# Patient Record
Sex: Female | Born: 1950
Health system: Southern US, Community
[De-identification: ages and names within clinical notes are randomized; demographics above are authoritative.]

## PROBLEM LIST (undated history)

## (undated) DIAGNOSIS — R0902 Hypoxemia: Secondary | ICD-10-CM

## (undated) DIAGNOSIS — M109 Gout, unspecified: Secondary | ICD-10-CM

## (undated) DIAGNOSIS — I5030 Unspecified diastolic (congestive) heart failure: Secondary | ICD-10-CM

## (undated) DIAGNOSIS — E669 Obesity, unspecified: Secondary | ICD-10-CM

## (undated) DIAGNOSIS — R079 Chest pain, unspecified: Secondary | ICD-10-CM

## (undated) DIAGNOSIS — E1121 Type 2 diabetes mellitus with diabetic nephropathy: Secondary | ICD-10-CM

## (undated) DIAGNOSIS — I10 Essential (primary) hypertension: Secondary | ICD-10-CM

## (undated) DIAGNOSIS — E7800 Pure hypercholesterolemia, unspecified: Secondary | ICD-10-CM

## (undated) DIAGNOSIS — G4733 Obstructive sleep apnea (adult) (pediatric): Secondary | ICD-10-CM

## (undated) HISTORY — DX: Hypoxemia: R09.02

## (undated) HISTORY — PX: ABDOMINAL HYSTERECTOMY: SHX81

---

## 1998-08-23 ENCOUNTER — Emergency Department (HOSPITAL_COMMUNITY): Admission: EM | Admit: 1998-08-23 | Discharge: 1998-08-23 | Payer: Self-pay | Admitting: Emergency Medicine

## 1998-09-03 ENCOUNTER — Encounter: Admission: RE | Admit: 1998-09-03 | Discharge: 1998-12-02 | Payer: Self-pay | Admitting: Orthopedic Surgery

## 1999-03-06 ENCOUNTER — Ambulatory Visit (HOSPITAL_COMMUNITY): Admission: RE | Admit: 1999-03-06 | Discharge: 1999-03-06 | Payer: Self-pay | Admitting: *Deleted

## 1999-03-06 ENCOUNTER — Encounter: Payer: Self-pay | Admitting: *Deleted

## 2000-04-14 ENCOUNTER — Encounter: Admission: RE | Admit: 2000-04-14 | Discharge: 2000-07-13 | Payer: Self-pay | Admitting: Internal Medicine

## 2000-06-05 ENCOUNTER — Encounter: Payer: Self-pay | Admitting: Internal Medicine

## 2000-06-05 ENCOUNTER — Ambulatory Visit (HOSPITAL_COMMUNITY): Admission: RE | Admit: 2000-06-05 | Discharge: 2000-06-05 | Payer: Self-pay | Admitting: Internal Medicine

## 2000-06-09 ENCOUNTER — Other Ambulatory Visit: Admission: RE | Admit: 2000-06-09 | Discharge: 2000-06-09 | Payer: Self-pay | Admitting: Internal Medicine

## 2001-05-21 ENCOUNTER — Encounter: Payer: Self-pay | Admitting: Podiatry

## 2001-05-21 ENCOUNTER — Ambulatory Visit (HOSPITAL_COMMUNITY): Admission: RE | Admit: 2001-05-21 | Discharge: 2001-05-21 | Payer: Self-pay | Admitting: Podiatry

## 2001-06-07 ENCOUNTER — Ambulatory Visit (HOSPITAL_COMMUNITY): Admission: RE | Admit: 2001-06-07 | Discharge: 2001-06-07 | Payer: Self-pay | Admitting: Internal Medicine

## 2001-06-07 ENCOUNTER — Encounter: Payer: Self-pay | Admitting: Internal Medicine

## 2002-05-18 ENCOUNTER — Ambulatory Visit (HOSPITAL_COMMUNITY): Admission: RE | Admit: 2002-05-18 | Discharge: 2002-05-18 | Payer: Self-pay | Admitting: Internal Medicine

## 2002-05-18 ENCOUNTER — Encounter: Payer: Self-pay | Admitting: Internal Medicine

## 2003-02-08 ENCOUNTER — Ambulatory Visit (HOSPITAL_COMMUNITY): Admission: RE | Admit: 2003-02-08 | Discharge: 2003-02-08 | Payer: Self-pay | Admitting: Internal Medicine

## 2004-01-19 ENCOUNTER — Ambulatory Visit (HOSPITAL_COMMUNITY): Admission: RE | Admit: 2004-01-19 | Discharge: 2004-01-19 | Payer: Self-pay | Admitting: Gastroenterology

## 2004-01-19 ENCOUNTER — Encounter (INDEPENDENT_AMBULATORY_CARE_PROVIDER_SITE_OTHER): Payer: Self-pay | Admitting: Specialist

## 2004-04-18 ENCOUNTER — Ambulatory Visit (HOSPITAL_COMMUNITY): Admission: RE | Admit: 2004-04-18 | Discharge: 2004-04-18 | Payer: Self-pay | Admitting: Internal Medicine

## 2004-05-01 ENCOUNTER — Encounter: Admission: RE | Admit: 2004-05-01 | Discharge: 2004-05-01 | Payer: Self-pay | Admitting: Internal Medicine

## 2004-12-13 ENCOUNTER — Emergency Department (HOSPITAL_COMMUNITY): Admission: EM | Admit: 2004-12-13 | Discharge: 2004-12-13 | Payer: Self-pay | Admitting: Family Medicine

## 2005-04-30 ENCOUNTER — Ambulatory Visit (HOSPITAL_COMMUNITY): Admission: RE | Admit: 2005-04-30 | Discharge: 2005-04-30 | Payer: Self-pay | Admitting: Internal Medicine

## 2006-05-28 ENCOUNTER — Encounter: Admission: RE | Admit: 2006-05-28 | Discharge: 2006-08-26 | Payer: Self-pay | Admitting: Occupational Medicine

## 2007-05-14 ENCOUNTER — Ambulatory Visit (HOSPITAL_COMMUNITY): Admission: RE | Admit: 2007-05-14 | Discharge: 2007-05-14 | Payer: Self-pay | Admitting: Internal Medicine

## 2007-07-10 ENCOUNTER — Emergency Department (HOSPITAL_COMMUNITY): Admission: EM | Admit: 2007-07-10 | Discharge: 2007-07-10 | Payer: Self-pay | Admitting: Emergency Medicine

## 2007-10-11 ENCOUNTER — Ambulatory Visit: Admission: RE | Admit: 2007-10-11 | Discharge: 2007-10-11 | Payer: Self-pay | Admitting: Otolaryngology

## 2007-10-11 IMAGING — CT CT PARANASAL SINUSES LIMITED
1 of 2 series · 16 of 30 positions shown, 20 images · non-contrast
Comparison: None

CLINICAL DATA: Sinusitis.  Nasal polyps.

CT PARANASAL SINUS LIMITED WITHOUT CONTRAST

[Series 3: ltd sinuses 3.0 h40s st · axial · 0.29mm/px · z∈[-106,+2]mm · 16 of 42 slices shown, 20 images]
[im 3/42  brain]
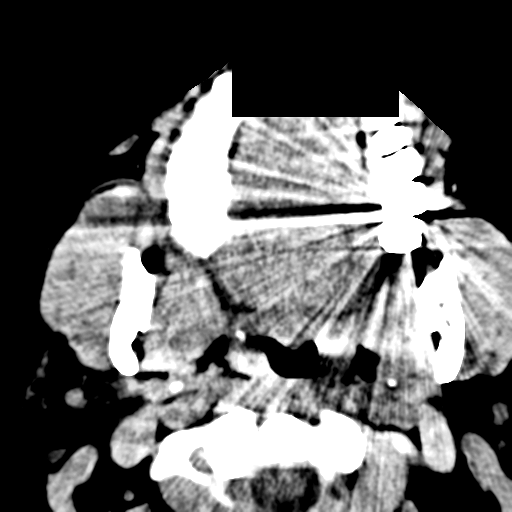
[im 3/42  bone]
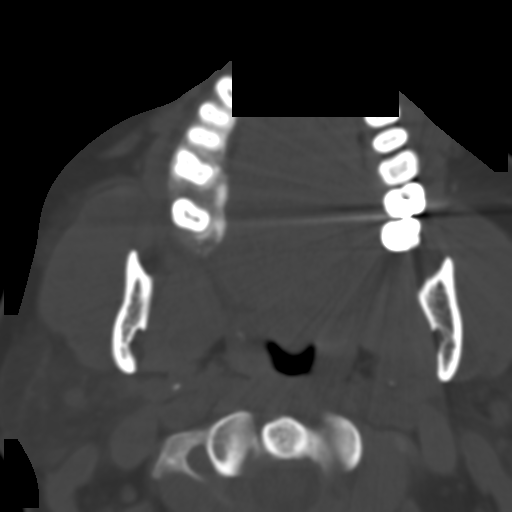
[im 5/42  bone]
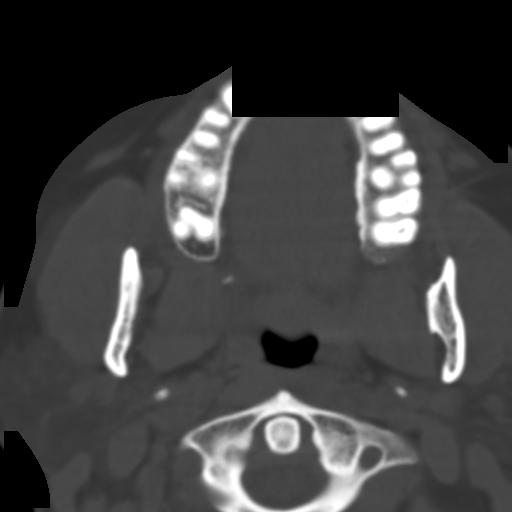
[im 7/42  bone]
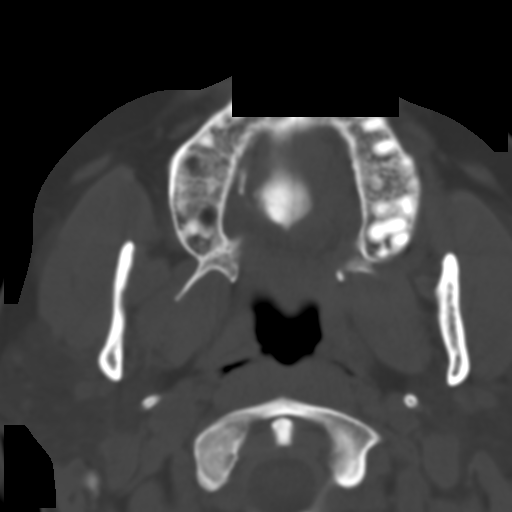
[im 11/42  bone]
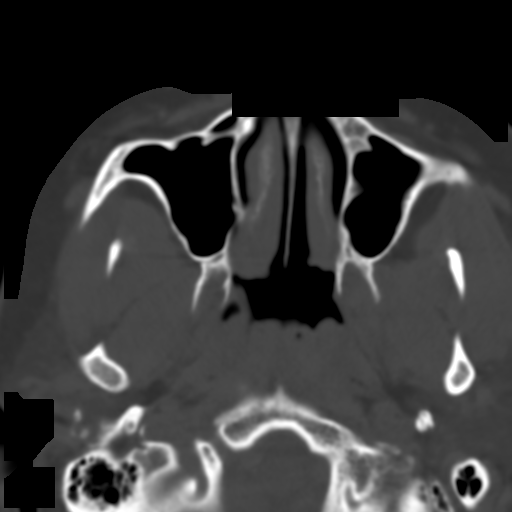
[im 13/42  brain]
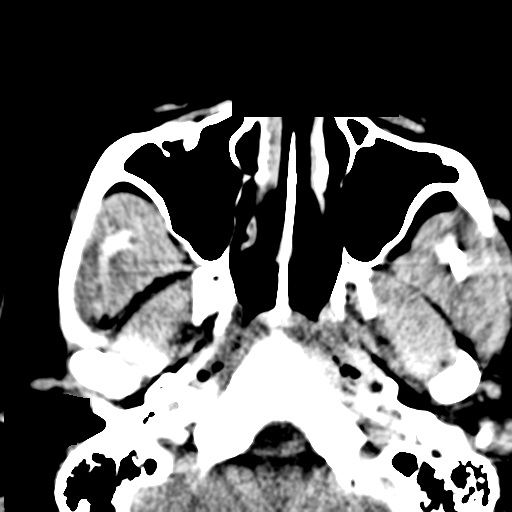
[im 13/42  bone]
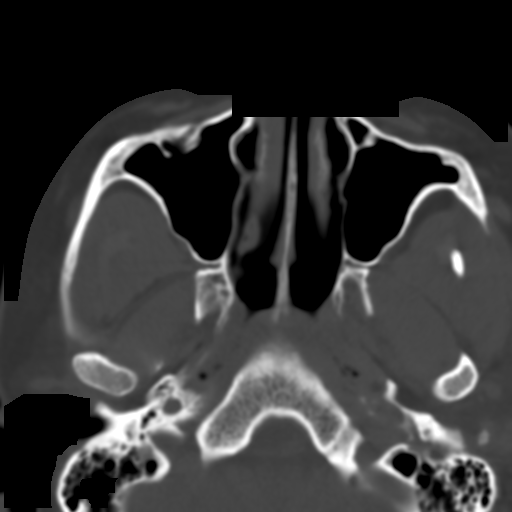
[im 15/42  bone]
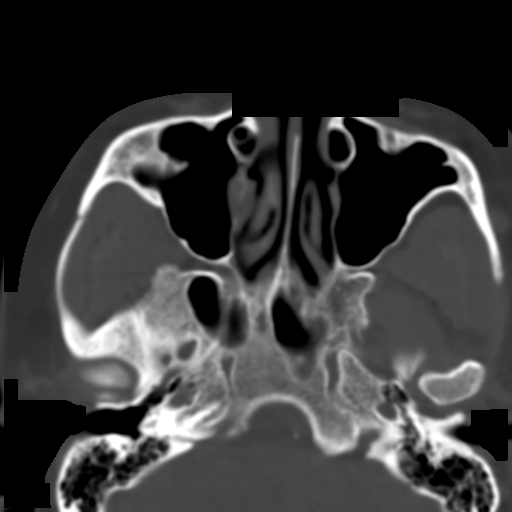
[im 17/42  bone]
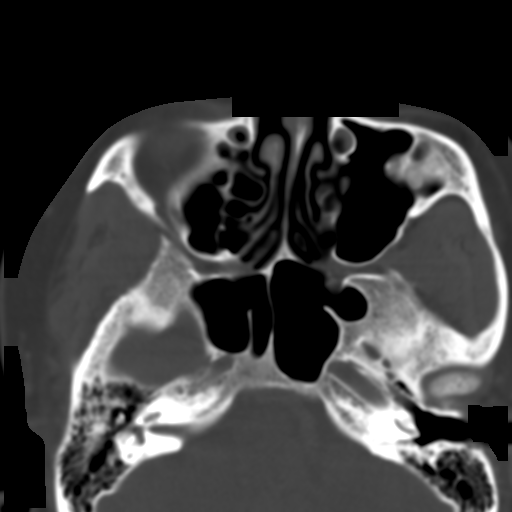
[im 19/42  bone]
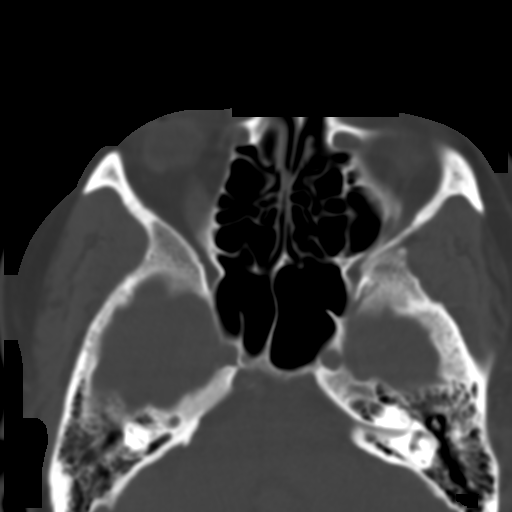
[im 23/42  brain]
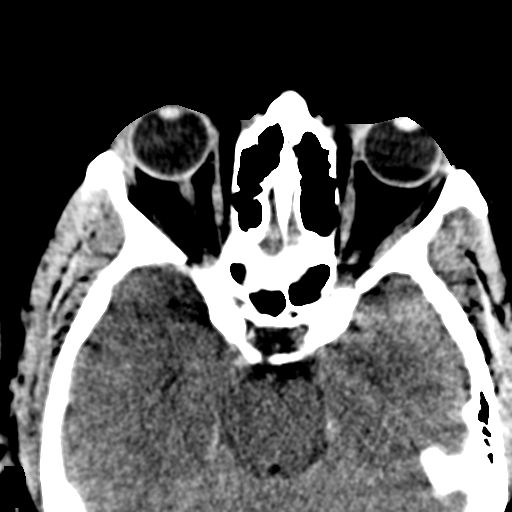
[im 23/42  bone]
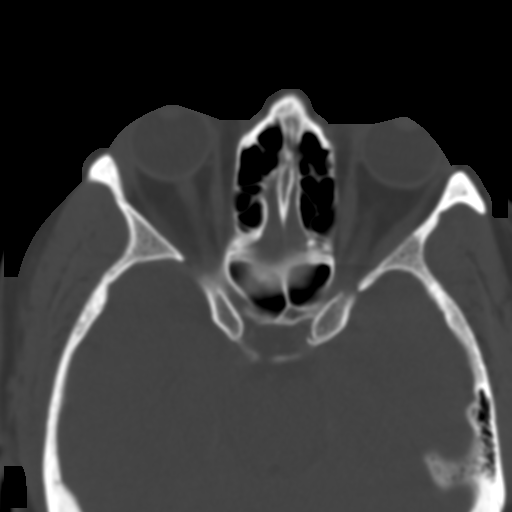
[im 25/42  bone]
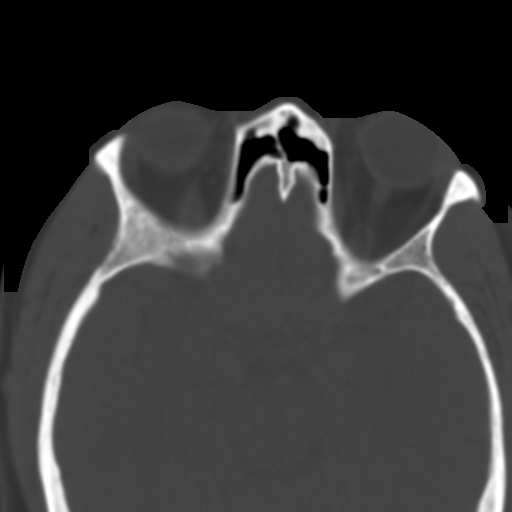
[im 27/42  bone]
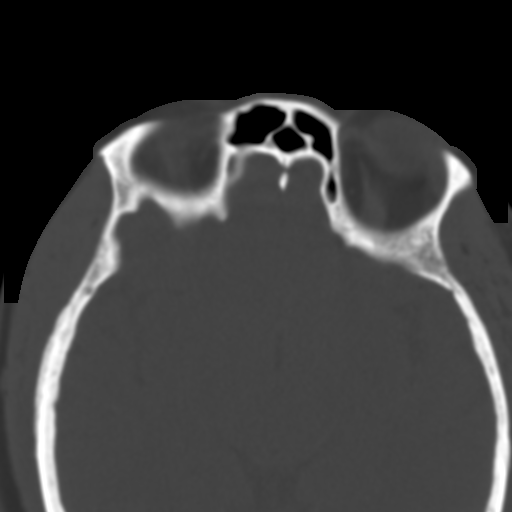
[im 29/42  bone]
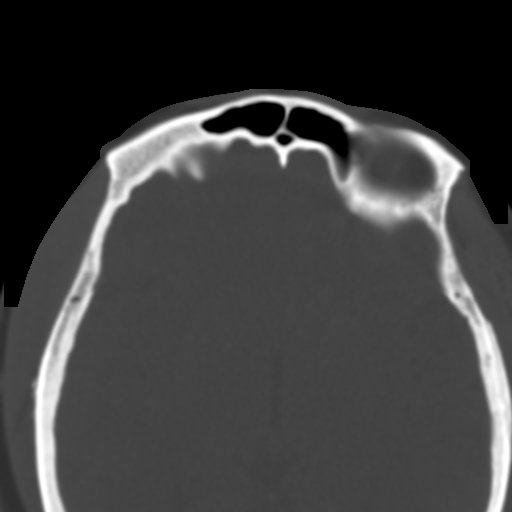
[im 31/42  brain]
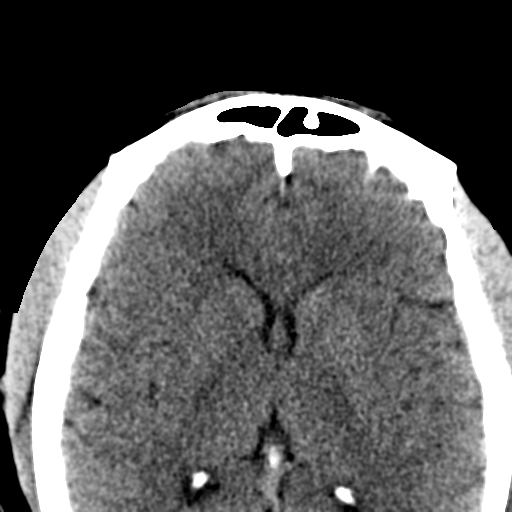
[im 31/42  bone]
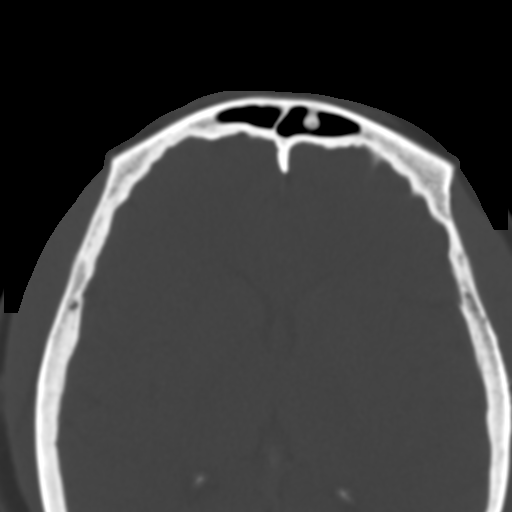
[im 35/42  bone]
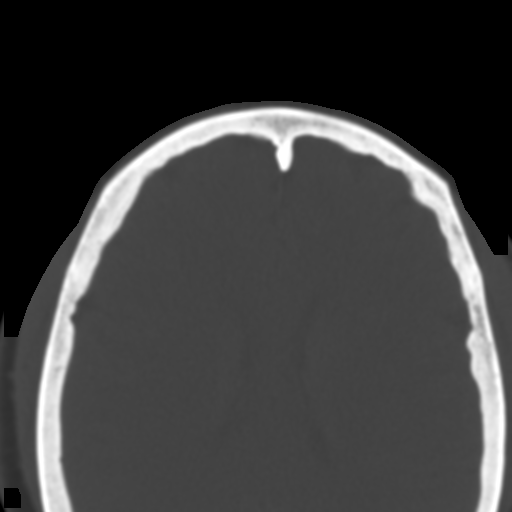
[im 37/42  bone]
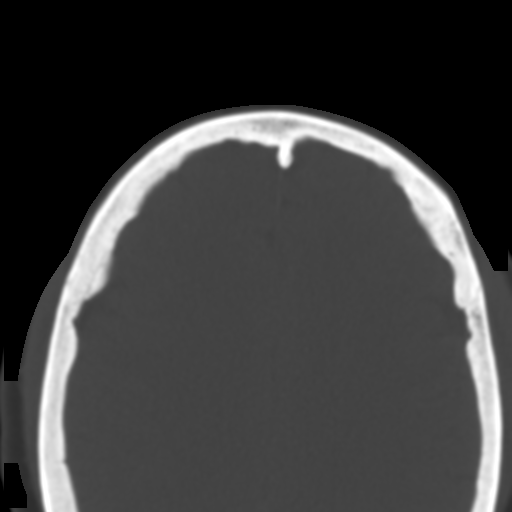
[im 39/42  bone]
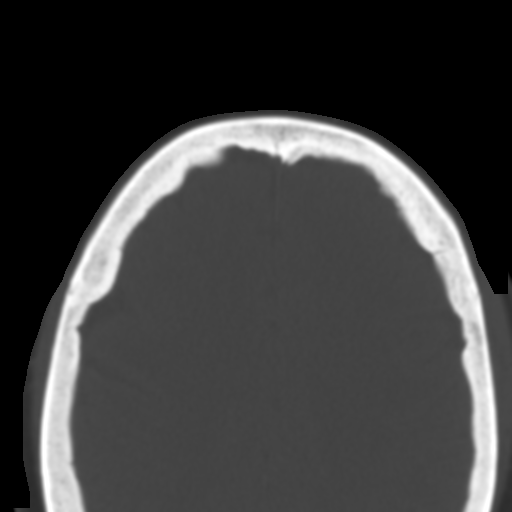

[16 of 30 positions shown; findings below may reference images not displayed]

FINDINGS: The paranasal sinuses are clear.  There is no mucosal
thickening.  No polyp or mass is identified in the sinuses.  There
is no bony change.

Incidental note is made of a well-defined calcified density the
left frontal sinus measuring 5 mm   compatible with a frontal
osteoma.
IMPRESSION: No significant sinus mucosal disease is present.

Left frontal sinus osteoma.

## 2007-11-12 ENCOUNTER — Ambulatory Visit (HOSPITAL_BASED_OUTPATIENT_CLINIC_OR_DEPARTMENT_OTHER): Admission: RE | Admit: 2007-11-12 | Discharge: 2007-11-12 | Payer: Self-pay | Admitting: Otolaryngology

## 2008-01-25 ENCOUNTER — Encounter (INDEPENDENT_AMBULATORY_CARE_PROVIDER_SITE_OTHER): Payer: Self-pay | Admitting: Otolaryngology

## 2008-01-25 ENCOUNTER — Ambulatory Visit (HOSPITAL_COMMUNITY): Admission: RE | Admit: 2008-01-25 | Discharge: 2008-01-26 | Payer: Self-pay | Admitting: Otolaryngology

## 2008-01-25 HISTORY — PX: UVULOPALATOPHARYNGOPLASTY (UPPP)/TONSILLECTOMY/SEPTOPLASTY: SHX6164

## 2008-06-09 ENCOUNTER — Ambulatory Visit (HOSPITAL_COMMUNITY): Admission: RE | Admit: 2008-06-09 | Discharge: 2008-06-09 | Payer: Self-pay | Admitting: Internal Medicine

## 2010-05-06 LAB — GLUCOSE, CAPILLARY
Glucose-Capillary: 123 mg/dL — ABNORMAL HIGH (ref 70–99)
Glucose-Capillary: 149 mg/dL — ABNORMAL HIGH (ref 70–99)
Glucose-Capillary: 181 mg/dL — ABNORMAL HIGH (ref 70–99)
Glucose-Capillary: 203 mg/dL — ABNORMAL HIGH (ref 70–99)
Glucose-Capillary: 231 mg/dL — ABNORMAL HIGH (ref 70–99)

## 2010-06-04 NOTE — Op Note (Signed)
NAMEGEORGEANNA, NALLE                 ACCOUNT NO.:  [id number]   MEDICAL RECORD NO.:  [id number]          PATIENT TYPE:  OIB   LOCATION:  3314                         FACILITY:  [facility]   PHYSICIAN:  Minna Merritts, M.D.   DATE OF BIRTH:  01-May-1950   DATE OF PROCEDURE:  DATE OF DISCHARGE:                               OPERATIVE REPORT   PREOPERATIVE DIAGNOSES:  Sleep apnea with an apnea-hypopnea index 45.2,  lowest O2 nadir 69%, deepest sleep was 7% at a time, and BMI 43.6.   POSTOPERATIVE DIAGNOSES:  Sleep apnea with an apnea-hypopnea index 45.2,  lowest O2 nadir 69%, deepest sleep was 7% at a time, and BMI 43.6.   SURGERY:  Septal reconstruction, turbinate reduction, reduction of  concha bullosa with a tonsillectomy and uvulopalatoplasty.   OPERATOR:  Minna Merritts, MD   ANESTHESIA:  General endotracheal.   PROCEDURE:  The patient placed in supine position under general  endotracheal anesthesia.  The patient was positioned and prepped and  draped in the usual manner using Hibiclens and the usual head drape.  The patient first was approached with preseptal surgery and the topical  cocaine and 1% Xylocaine was used, topical cocaine 200 mg and 1%  Xylocaine is 3 mL.  Hemitransfixion incision was made on the left side  of the septum carried back along the quadrilateral cartilage and the  posterior strip of quadrilateral cartilage was removed as this was  deviated grossly to the left blocking the nose on the left side.  The  high ethmoid septal deviation was also removed using the open and close  Jansen-Middletons in an effort to bring the septum back to the midline.  We worked back to the vomerine septum as well and removed a portion of  this.  As this was deviated grossly off to the left.  The right side was  fairly blocked as well with concha bullosa, which we compressed but did  not remove any mucous membrane.  The inferior turbinates were  aggressively all fractured and  compressed and gaining a considerable  amount of space in the nasal vestibule.  Once this was completed, the  closure was with 5-0 plain catgut and through-and-through septal suture  x2 was a Blanca stitch using 4-0 plain.  Once this was completed, then  we placed Telfa with Neosporin ointment and the attention was then  carried to the oral cavity.  We were using the [facility] mouth gag, the  tongue was depressed and the gag was placed and then the tonsils could  be well visualized, which were taking up considerable space in this  relatively small throat.  Tonsils were removed gaining a considerable  amount of space and the tongue was obviously large and her neck was also  quite full pushing the tongue high in her oral cavity because of her  weight issue.  Then, the uvula and palate, the uvula was about 3 times  larger than its usual size and this was trimmed back to a more normal  status.  The palate was also trimmed to elevate the contour of the  palate approximately 7 mm.  The AP mucous membrane was closed using a 5-  0 plain catgut.  Once this was completed and the tonsillar beds were  completely evaluated and found to be completely dry, the stomach was  suctioned and the patient was taken into the recovery room in excellent  condition once she was awakened and extubated.  Also, we removed the  Telfa from her nose and placed an 8 and 7-1/2 anesthesia trumpet on each  side to guarantee her airway and in the immediate postop  status.  Once this was completed, the patient was awakened and tolerated  the procedure well.  Blood loss was estimated at 40 mL and postop course  should be kept overnight in the 3300 intensive step-down unit for  observation.  The follow up will then be in 5 days, then 10 days, and 3  weeks and 6 weeks.           ______________________________  Minna Merritts, M.D.     JC/MEDQ  D:  01/25/2008  T:  01/26/2008  Job:  [id number]   cc:   Theda Belfast. Baird Cancer, M.D.

## 2010-06-04 NOTE — Procedures (Signed)
NAMETENAY, MALZ                 ACCOUNT NO.:  [id number]   MEDICAL RECORD NO.:  [id number]          PATIENT TYPE:  OUT   LOCATION:  SLEEP CENTER                 FACILITY:  [facility]   PHYSICIAN:  [name] D. Annamaria Boots, MD, FCCP, FACPDATE OF BIRTH:  16-Apr-1950   DATE OF STUDY:  11/12/2007                            NOCTURNAL POLYSOMNOGRAM   REFERRING PHYSICIAN:  Minna Merritts, M.D.   INDICATION FOR STUDY:  Hypersomnia with sleep apnea.   EPWORTH SLEEPINESS SCORE:  Epworth sleepiness score 5/24.  BMI 43.6.  Weight 254 pounds.  Height 64 inches.  Neck 17 inches.   MEDICATIONS:  Home medications are charted and reviewed.   SLEEP ARCHITECTURE:  Total sleep time 297 minutes with sleep efficiency  79.4%.  Stage I 16.1%.  Stage II 76.8%.  Stage III absent.  REM 7.1% of  total sleep time.  Sleep latency 11.5 minutes.  REM latency 259 minutes.  Awake after sleep onset 65 minutes.  Arousal index 12.3.  No bedtime  medication was taken.   RESPIRATORY DATA:  Diagnostic NPSG protocol as requested.  Apnea-  hypopnea index (AHI) 45.2 per hour.  A total of 224 events were scored  including 18 obstructive apneas, 2 central apneas, and 204 hypopneas.  Events were more common while supine.  REM AHI 68.6.   OXYGEN DATA:  Loud snoring with oxygen desaturation to a nadir of 69%.  Mean oxygen saturation through the study was 88.4% on room air  suggesting cardiopulmonary disease.  A total of 154 minutes was spent  with oxygen saturation less than 88%.   CARDIAC DATA:  Normal sinus rhythm with occasional PVC.   MOVEMENT-PARASOMNIA:  A total of 67 events were counted including 23  associated with arousal or awakening for periodic limb movement with  arousal index of 4.6 per hour.   IMPRESSIONS/RECOMMENDATIONS:  1. Moderate obstructive sleep apnea/hypopnea syndrome, AHI 45.2 per      hour with events more common while supine, but seen in all sleep      positions.  Loud snoring with oxygen desaturation to a  nadir of      69%.  2. Note that mean oxygen saturation through the study was 88.4% with a      total of 154.6 minutes was spent with      oxygen saturation less than 88%.  This suggested the possibility of      underlying cardiopulmonary disease.  3. Consider return for CPAP titration or evaluate for alternative      management as appropriate.      [name] D. Annamaria Boots, MD, [facility] [place], [place], Tax adviser of Sleep Medicine  Electronically Signed     CDY/MEDQ  D:  11/20/2007 10:00:31  T:  11/20/2007 23:22:23  Job:  [id number]

## 2010-06-04 NOTE — H&P (Signed)
NAMEJEREMY, Anna Clay                 ACCOUNT NO.:  [id number]   MEDICAL RECORD NO.:  [id number]          PATIENT TYPE:  OIB   LOCATION:  [id number]                         FACILITY:  [place]   PHYSICIAN:  Minna Merritts, M.D.   DATE OF BIRTH:  Jun 30, 1950   DATE OF ADMISSION:  01/25/2008  DATE OF DISCHARGE:                              HISTORY & PHYSICAL   This patient is a 60 year old female who works over at [employer]  who has had some considerable sleep apnea issues and had a nocturnal  polysomnogram, which showed she had a BMI of 43.6.  She spent only 7% of  her time in REM or deep sleep.  She had an O2 nadir of 69% and an RDI or  AHI,  respiratory disturbance index of 45.2.  She also has a diabetic  history and is on metformin for her diabetes.  Her admission blood sugar  was 146.  She is on a diet.  She is also a smoker for 35 years of over a  pack a day, and she stopped smoking approximately 1 week ago.  She does  not have any H1N1, but does have a history of bronchial asthma and has  used albuterol in the past for her respiratory situation.  Her chest x-  ray was completed and showed developing pulmonary interstitial edema  with atypical pulmonary infection.  She has no evidence of any  infection.  Her white count was 7.9.  She now enters for a septal  reconstruction, turbinate reduction under general endotracheal  anesthesia with a uvulopalatoplasty and tonsillectomy.  Her past history  is that of diabetes type 2 on metformin.  She takes other medications  listed in the chart.  She has an allergy to CODEINE, TETRACYCLINE and  IVP dye.  She does not like Vicodin, it makes her a little bit loopy she  states, but Vicodin I think would be the medication of choice at least  in the immediate postop course.  She has been a smoker.  She does drink  occasionally.  Never used drugs.  She works over at [employer] and  she weighs 252 pounds.   PHYSICAL EXAMINATION:  VITAL SIGNS:   Blood pressure is 146/86, pulse was  93.  EKG was within normal limits.  HEENT:  Her ears are clear.  Tympanic membranes are clear and move well.  Her nose shows a septal deviation, a turbinate hypertrophy, the septum  has primarily a high ethmoid and quadrangular cartilage posteriorly.  Turbinates are very large and she has a concha bullosa on the right.  The oral cavity reveals the tonsils to be clearly large with the very  low uvula and palate and a very redundant uvula.  Larynx is clear.  True  cords, false cords, epiglottis, and base of tongue and lateral  pharyngeal wall are all clear of any ulceration or mass or any edema.  NECK:  Full, but free of any cervical adenopathy, thyromegaly, or mass.  The tongue is really quite high arched in the mouth as reasonably small.  The lips, teeth, and gums  are within normal limits.  CHEST:  Shows some mild expiratory wheezes, but they are extremely mild  and diffuse.  She has not used any albuterol recently.  CARDIOVASCULAR:  Normal S1 and S2.  No murmurs or gallops.  ABDOMEN:  Obese.  EXTREMITIES:  Unremarkable.   INITIAL DIAGNOSES:  Sleep apnea with redundant uvula and palate,  enlarged tonsils and septal deviation with type 2 diabetes, and history  of smoking for 35 years.           ______________________________  Minna Merritts, M.D.     JC/MEDQ  D:  01/25/2008  T:  01/26/2008  Job:  [id number]   cc:   Theda Belfast. Baird Cancer, M.D.

## 2010-10-17 LAB — CBC
HCT: 43.4
Hemoglobin: 13.6
MCV: 67.6 — ABNORMAL LOW
RBC: 6.41 — ABNORMAL HIGH
WBC: 8.1

## 2010-10-17 LAB — POCT I-STAT, CHEM 8
Calcium, Ion: 1.19
Chloride: 104
Hemoglobin: 16.3 — ABNORMAL HIGH
TCO2: 26

## 2010-10-17 LAB — POCT CARDIAC MARKERS
CKMB, poc: 1.5
Myoglobin, poc: 75.5
Operator id: 231701
Troponin i, poc: <0.05

## 2010-10-17 LAB — DIFFERENTIAL
Basophils Absolute: 0.1
Basophils Relative: 1
Eosinophils Absolute: 0.2
Lymphocytes Relative: 31
Neutro Abs: 4.7

## 2010-10-25 LAB — URINALYSIS, ROUTINE W REFLEX MICROSCOPIC
Bilirubin Urine: NEGATIVE
Glucose, UA: NEGATIVE mg/dL
Hgb urine dipstick: NEGATIVE
Ketones, ur: NEGATIVE mg/dL
Leukocytes, UA: NEGATIVE
Nitrite: NEGATIVE
Protein, ur: 100 mg/dL — AB
Specific Gravity, Urine: 1.016 (ref 1.005–1.030)
Urobilinogen, UA: 1 mg/dL (ref 0.0–1.0)
pH: 6.5 (ref 5.0–8.0)

## 2010-10-25 LAB — BASIC METABOLIC PANEL
Calcium: 9.4 mg/dL (ref 8.4–10.5)
Chloride: 100 mEq/L (ref 96–112)
Creatinine, Ser: 0.88 mg/dL (ref 0.4–1.2)

## 2010-10-25 LAB — URINE MICROSCOPIC-ADD ON

## 2010-10-25 LAB — CBC
HCT: 42.8 % (ref 36.0–46.0)
Hemoglobin: 12.8 g/dL (ref 12.0–15.0)
MCHC: 29.8 g/dL — ABNORMAL LOW (ref 30.0–36.0)
MCV: 73.1 fL — ABNORMAL LOW (ref 78.0–100.0)
Platelets: 202 K/uL (ref 150–400)
RBC: 5.85 MIL/uL — ABNORMAL HIGH (ref 3.87–5.11)
RDW: 17.8 % — ABNORMAL HIGH (ref 11.5–15.5)
WBC: 7.9 K/uL (ref 4.0–10.5)

## 2010-10-25 LAB — BASIC METABOLIC PANEL WITH GFR
BUN: 10 mg/dL (ref 6–23)
CO2: 29 meq/L (ref 19–32)
GFR calc Af Amer: >60 mL/min (ref >60)
GFR calc non Af Amer: >60 mL/min (ref >60)
Glucose, Bld: 140 mg/dL — ABNORMAL HIGH (ref 70–99)
Potassium: 4.2 meq/L (ref 3.5–5.1)
Sodium: 137 meq/L (ref 135–145)

## 2013-05-24 DIAGNOSIS — R413 Other amnesia: Secondary | ICD-10-CM | POA: Diagnosis not present

## 2013-05-24 DIAGNOSIS — N182 Chronic kidney disease, stage 2 (mild): Secondary | ICD-10-CM | POA: Diagnosis not present

## 2013-05-24 DIAGNOSIS — I129 Hypertensive chronic kidney disease with stage 1 through stage 4 chronic kidney disease, or unspecified chronic kidney disease: Secondary | ICD-10-CM | POA: Diagnosis not present

## 2013-05-24 DIAGNOSIS — E1129 Type 2 diabetes mellitus with other diabetic kidney complication: Secondary | ICD-10-CM | POA: Diagnosis not present

## 2013-05-24 DIAGNOSIS — E1165 Type 2 diabetes mellitus with hyperglycemia: Secondary | ICD-10-CM | POA: Diagnosis not present

## 2013-05-24 DIAGNOSIS — N058 Unspecified nephritic syndrome with other morphologic changes: Secondary | ICD-10-CM | POA: Diagnosis not present

## 2013-05-24 DIAGNOSIS — M2550 Pain in unspecified joint: Secondary | ICD-10-CM | POA: Diagnosis not present

## 2013-05-25 DIAGNOSIS — N182 Chronic kidney disease, stage 2 (mild): Secondary | ICD-10-CM | POA: Diagnosis not present

## 2013-05-25 DIAGNOSIS — I129 Hypertensive chronic kidney disease with stage 1 through stage 4 chronic kidney disease, or unspecified chronic kidney disease: Secondary | ICD-10-CM | POA: Diagnosis not present

## 2013-05-25 DIAGNOSIS — E1129 Type 2 diabetes mellitus with other diabetic kidney complication: Secondary | ICD-10-CM | POA: Diagnosis not present

## 2013-05-25 DIAGNOSIS — E1165 Type 2 diabetes mellitus with hyperglycemia: Secondary | ICD-10-CM | POA: Diagnosis not present

## 2013-05-25 DIAGNOSIS — N058 Unspecified nephritic syndrome with other morphologic changes: Secondary | ICD-10-CM | POA: Diagnosis not present

## 2013-08-16 DIAGNOSIS — M2550 Pain in unspecified joint: Secondary | ICD-10-CM | POA: Diagnosis not present

## 2013-08-16 DIAGNOSIS — L02818 Cutaneous abscess of other sites: Secondary | ICD-10-CM | POA: Diagnosis not present

## 2013-08-16 DIAGNOSIS — L03818 Cellulitis of other sites: Secondary | ICD-10-CM | POA: Diagnosis not present

## 2013-08-16 DIAGNOSIS — E1129 Type 2 diabetes mellitus with other diabetic kidney complication: Secondary | ICD-10-CM | POA: Diagnosis not present

## 2013-08-16 DIAGNOSIS — D649 Anemia, unspecified: Secondary | ICD-10-CM | POA: Diagnosis not present

## 2013-08-16 DIAGNOSIS — R609 Edema, unspecified: Secondary | ICD-10-CM | POA: Diagnosis not present

## 2013-08-16 DIAGNOSIS — Z0000 Encounter for general adult medical examination without abnormal findings: Secondary | ICD-10-CM | POA: Diagnosis not present

## 2013-08-16 DIAGNOSIS — N058 Unspecified nephritic syndrome with other morphologic changes: Secondary | ICD-10-CM | POA: Diagnosis not present

## 2013-08-16 DIAGNOSIS — N182 Chronic kidney disease, stage 2 (mild): Secondary | ICD-10-CM | POA: Diagnosis not present

## 2013-08-16 DIAGNOSIS — E559 Vitamin D deficiency, unspecified: Secondary | ICD-10-CM | POA: Diagnosis not present

## 2013-08-16 DIAGNOSIS — E1165 Type 2 diabetes mellitus with hyperglycemia: Secondary | ICD-10-CM | POA: Diagnosis not present

## 2013-08-16 DIAGNOSIS — I1310 Hypertensive heart and chronic kidney disease without heart failure, with stage 1 through stage 4 chronic kidney disease, or unspecified chronic kidney disease: Secondary | ICD-10-CM | POA: Diagnosis not present

## 2013-08-30 ENCOUNTER — Other Ambulatory Visit: Payer: Self-pay

## 2013-08-30 DIAGNOSIS — Z1231 Encounter for screening mammogram for malignant neoplasm of breast: Secondary | ICD-10-CM

## 2013-08-31 DIAGNOSIS — E7800 Pure hypercholesterolemia, unspecified: Secondary | ICD-10-CM | POA: Diagnosis not present

## 2013-08-31 DIAGNOSIS — R609 Edema, unspecified: Secondary | ICD-10-CM | POA: Diagnosis not present

## 2013-08-31 DIAGNOSIS — I10 Essential (primary) hypertension: Secondary | ICD-10-CM | POA: Diagnosis not present

## 2013-08-31 DIAGNOSIS — R079 Chest pain, unspecified: Secondary | ICD-10-CM | POA: Diagnosis not present

## 2013-09-08 ENCOUNTER — Ambulatory Visit
Admission: RE | Admit: 2013-09-08 | Discharge: 2013-09-08 | Disposition: A | Discharge status: Home / Self Care | Payer: Medicare Other | Source: Ambulatory Visit

## 2013-09-08 ENCOUNTER — Encounter (INDEPENDENT_AMBULATORY_CARE_PROVIDER_SITE_OTHER): Payer: Self-pay

## 2013-09-08 DIAGNOSIS — Z1231 Encounter for screening mammogram for malignant neoplasm of breast: Secondary | ICD-10-CM | POA: Diagnosis not present

## 2013-09-09 DIAGNOSIS — R079 Chest pain, unspecified: Secondary | ICD-10-CM | POA: Diagnosis not present

## 2013-09-13 ENCOUNTER — Other Ambulatory Visit: Payer: Self-pay | Admitting: Internal Medicine

## 2013-09-13 DIAGNOSIS — R928 Other abnormal and inconclusive findings on diagnostic imaging of breast: Secondary | ICD-10-CM

## 2013-09-19 ENCOUNTER — Ambulatory Visit
Admission: RE | Admit: 2013-09-19 | Discharge: 2013-09-19 | Disposition: A | Discharge status: Home / Self Care | Payer: Medicare Other | Source: Ambulatory Visit | Attending: Internal Medicine | Admitting: Internal Medicine

## 2013-09-19 DIAGNOSIS — R928 Other abnormal and inconclusive findings on diagnostic imaging of breast: Secondary | ICD-10-CM | POA: Diagnosis not present

## 2013-09-22 DIAGNOSIS — R0609 Other forms of dyspnea: Secondary | ICD-10-CM | POA: Diagnosis not present

## 2013-09-22 DIAGNOSIS — R609 Edema, unspecified: Secondary | ICD-10-CM | POA: Diagnosis not present

## 2013-10-04 ENCOUNTER — Encounter (HOSPITAL_COMMUNITY): Payer: Self-pay | Admitting: Emergency Medicine

## 2013-10-04 ENCOUNTER — Inpatient Hospital Stay (HOSPITAL_COMMUNITY)
Admission: EM | Admit: 2013-10-04 | Discharge: 2013-10-12 | DRG: 291 | Disposition: A | Discharge status: Home Health | Payer: Medicare Other | Attending: Internal Medicine | Admitting: Internal Medicine

## 2013-10-04 ENCOUNTER — Inpatient Hospital Stay (HOSPITAL_COMMUNITY): Payer: Medicare Other

## 2013-10-04 ENCOUNTER — Emergency Department (HOSPITAL_COMMUNITY): Payer: Medicare Other

## 2013-10-04 DIAGNOSIS — I517 Cardiomegaly: Secondary | ICD-10-CM | POA: Diagnosis not present

## 2013-10-04 DIAGNOSIS — Z6841 Body Mass Index (BMI) 40.0 and over, adult: Secondary | ICD-10-CM | POA: Diagnosis not present

## 2013-10-04 DIAGNOSIS — D509 Iron deficiency anemia, unspecified: Secondary | ICD-10-CM | POA: Diagnosis present

## 2013-10-04 DIAGNOSIS — J9601 Acute respiratory failure with hypoxia: Secondary | ICD-10-CM | POA: Diagnosis present

## 2013-10-04 DIAGNOSIS — I10 Essential (primary) hypertension: Secondary | ICD-10-CM | POA: Diagnosis not present

## 2013-10-04 DIAGNOSIS — Z881 Allergy status to other antibiotic agents status: Secondary | ICD-10-CM

## 2013-10-04 DIAGNOSIS — I2789 Other specified pulmonary heart diseases: Secondary | ICD-10-CM

## 2013-10-04 DIAGNOSIS — I2609 Other pulmonary embolism with acute cor pulmonale: Secondary | ICD-10-CM | POA: Diagnosis present

## 2013-10-04 DIAGNOSIS — N189 Chronic kidney disease, unspecified: Secondary | ICD-10-CM | POA: Diagnosis not present

## 2013-10-04 DIAGNOSIS — E1129 Type 2 diabetes mellitus with other diabetic kidney complication: Secondary | ICD-10-CM | POA: Diagnosis present

## 2013-10-04 DIAGNOSIS — I2699 Other pulmonary embolism without acute cor pulmonale: Secondary | ICD-10-CM | POA: Diagnosis not present

## 2013-10-04 DIAGNOSIS — E7800 Pure hypercholesterolemia, unspecified: Secondary | ICD-10-CM | POA: Diagnosis present

## 2013-10-04 DIAGNOSIS — I509 Heart failure, unspecified: Secondary | ICD-10-CM | POA: Diagnosis present

## 2013-10-04 DIAGNOSIS — E662 Morbid (severe) obesity with alveolar hypoventilation: Secondary | ICD-10-CM | POA: Diagnosis present

## 2013-10-04 DIAGNOSIS — E1165 Type 2 diabetes mellitus with hyperglycemia: Secondary | ICD-10-CM | POA: Diagnosis present

## 2013-10-04 DIAGNOSIS — G4733 Obstructive sleep apnea (adult) (pediatric): Secondary | ICD-10-CM | POA: Diagnosis present

## 2013-10-04 DIAGNOSIS — D649 Anemia, unspecified: Secondary | ICD-10-CM | POA: Diagnosis present

## 2013-10-04 DIAGNOSIS — F17200 Nicotine dependence, unspecified, uncomplicated: Secondary | ICD-10-CM | POA: Diagnosis present

## 2013-10-04 DIAGNOSIS — E119 Type 2 diabetes mellitus without complications: Secondary | ICD-10-CM | POA: Diagnosis present

## 2013-10-04 DIAGNOSIS — J9600 Acute respiratory failure, unspecified whether with hypoxia or hypercapnia: Secondary | ICD-10-CM | POA: Diagnosis present

## 2013-10-04 DIAGNOSIS — Z7982 Long term (current) use of aspirin: Secondary | ICD-10-CM | POA: Diagnosis not present

## 2013-10-04 DIAGNOSIS — I5033 Acute on chronic diastolic (congestive) heart failure: Principal | ICD-10-CM | POA: Diagnosis present

## 2013-10-04 DIAGNOSIS — R0602 Shortness of breath: Secondary | ICD-10-CM | POA: Diagnosis not present

## 2013-10-04 DIAGNOSIS — I129 Hypertensive chronic kidney disease with stage 1 through stage 4 chronic kidney disease, or unspecified chronic kidney disease: Secondary | ICD-10-CM | POA: Diagnosis present

## 2013-10-04 DIAGNOSIS — T81718A Complication of other artery following a procedure, not elsewhere classified, initial encounter: Secondary | ICD-10-CM | POA: Diagnosis not present

## 2013-10-04 DIAGNOSIS — I279 Pulmonary heart disease, unspecified: Secondary | ICD-10-CM | POA: Diagnosis not present

## 2013-10-04 DIAGNOSIS — I959 Hypotension, unspecified: Secondary | ICD-10-CM | POA: Diagnosis not present

## 2013-10-04 DIAGNOSIS — N183 Chronic kidney disease, stage 3 unspecified: Secondary | ICD-10-CM | POA: Diagnosis present

## 2013-10-04 DIAGNOSIS — E876 Hypokalemia: Secondary | ICD-10-CM | POA: Diagnosis not present

## 2013-10-04 DIAGNOSIS — M7989 Other specified soft tissue disorders: Secondary | ICD-10-CM | POA: Diagnosis not present

## 2013-10-04 DIAGNOSIS — R0902 Hypoxemia: Secondary | ICD-10-CM | POA: Diagnosis present

## 2013-10-04 DIAGNOSIS — D638 Anemia in other chronic diseases classified elsewhere: Secondary | ICD-10-CM | POA: Diagnosis present

## 2013-10-04 DIAGNOSIS — Z91041 Radiographic dye allergy status: Secondary | ICD-10-CM | POA: Diagnosis not present

## 2013-10-04 DIAGNOSIS — M109 Gout, unspecified: Secondary | ICD-10-CM | POA: Diagnosis present

## 2013-10-04 DIAGNOSIS — J449 Chronic obstructive pulmonary disease, unspecified: Secondary | ICD-10-CM | POA: Diagnosis not present

## 2013-10-04 DIAGNOSIS — I272 Pulmonary hypertension, unspecified: Secondary | ICD-10-CM | POA: Diagnosis present

## 2013-10-04 DIAGNOSIS — R079 Chest pain, unspecified: Secondary | ICD-10-CM | POA: Diagnosis not present

## 2013-10-04 DIAGNOSIS — N179 Acute kidney failure, unspecified: Secondary | ICD-10-CM | POA: Diagnosis present

## 2013-10-04 DIAGNOSIS — I50811 Acute right heart failure: Secondary | ICD-10-CM | POA: Diagnosis present

## 2013-10-04 DIAGNOSIS — E118 Type 2 diabetes mellitus with unspecified complications: Secondary | ICD-10-CM

## 2013-10-04 DIAGNOSIS — I1310 Hypertensive heart and chronic kidney disease without heart failure, with stage 1 through stage 4 chronic kidney disease, or unspecified chronic kidney disease: Secondary | ICD-10-CM | POA: Diagnosis not present

## 2013-10-04 DIAGNOSIS — IMO0002 Reserved for concepts with insufficient information to code with codable children: Secondary | ICD-10-CM

## 2013-10-04 DIAGNOSIS — I5042 Chronic combined systolic (congestive) and diastolic (congestive) heart failure: Secondary | ICD-10-CM | POA: Diagnosis not present

## 2013-10-04 HISTORY — DX: Chest pain, unspecified: R07.9

## 2013-10-04 HISTORY — DX: Essential (primary) hypertension: I10

## 2013-10-04 HISTORY — DX: Obstructive sleep apnea (adult) (pediatric): G47.33

## 2013-10-04 HISTORY — DX: Gout, unspecified: M10.9

## 2013-10-04 HISTORY — DX: Obesity, unspecified: E66.9

## 2013-10-04 HISTORY — DX: Pure hypercholesterolemia, unspecified: E78.00

## 2013-10-04 HISTORY — DX: Unspecified diastolic (congestive) heart failure: I50.30

## 2013-10-04 HISTORY — DX: Type 2 diabetes mellitus with diabetic nephropathy: E11.21

## 2013-10-04 LAB — COMPREHENSIVE METABOLIC PANEL
ALT: 9 U/L (ref 0–35)
ANION GAP: 14 (ref 5–15)
AST: 17 U/L (ref 0–37)
Albumin: 3 g/dL — ABNORMAL LOW (ref 3.5–5.2)
Alkaline Phosphatase: 60 U/L (ref 39–117)
BUN: 46 mg/dL — AB (ref 6–23)
CALCIUM: 9.8 mg/dL (ref 8.4–10.5)
CO2: 27 meq/L (ref 19–32)
Chloride: 98 mEq/L (ref 96–112)
Creatinine, Ser: 1.32 mg/dL — ABNORMAL HIGH (ref 0.50–1.10)
GFR calc Af Amer: 49 mL/min — ABNORMAL LOW (ref >90)
GFR, EST NON AFRICAN AMERICAN: 42 mL/min — AB (ref >90)
Glucose, Bld: 115 mg/dL — ABNORMAL HIGH (ref 70–99)
Potassium: 4.8 mEq/L (ref 3.7–5.3)
Sodium: 139 mEq/L (ref 137–147)
TOTAL PROTEIN: 6.8 g/dL (ref 6.0–8.3)
Total Bilirubin: 0.8 mg/dL (ref 0.3–1.2)

## 2013-10-04 LAB — CBC WITH DIFFERENTIAL/PLATELET
BASOS ABS: 0 10*3/uL (ref 0.0–0.1)
BASOS PCT: 0 % (ref 0–1)
Band Neutrophils: 0 % (ref 0–10)
Blasts: 0 %
EOS ABS: 0.1 10*3/uL (ref 0.0–0.7)
EOS PCT: 1 % (ref 0–5)
HCT: 42.1 % (ref 36.0–46.0)
Hemoglobin: 11.9 g/dL — ABNORMAL LOW (ref 12.0–15.0)
LYMPHS PCT: 26 % (ref 12–46)
Lymphs Abs: 2.5 10*3/uL (ref 0.7–4.0)
MCH: 19.6 pg — ABNORMAL LOW (ref 26.0–34.0)
MCHC: 28.3 g/dL — ABNORMAL LOW (ref 30.0–36.0)
MCV: 69.5 fL — ABNORMAL LOW (ref 78.0–100.0)
Metamyelocytes Relative: 0 %
Monocytes Absolute: 0.4 10*3/uL (ref 0.1–1.0)
Monocytes Relative: 4 % (ref 3–12)
Myelocytes: 1 %
NEUTROS ABS: 6.7 10*3/uL (ref 1.7–7.7)
NEUTROS PCT: 68 % (ref 43–77)
NRBC: 0 /100{WBCs}
Platelets: 178 10*3/uL (ref 150–400)
Promyelocytes Absolute: 0 %
RBC: 6.06 MIL/uL — AB (ref 3.87–5.11)
RDW: 23.6 % — ABNORMAL HIGH (ref 11.5–15.5)
WBC: 9.7 10*3/uL (ref 4.0–10.5)

## 2013-10-04 LAB — CBG MONITORING, ED: Glucose-Capillary: 120 mg/dL — ABNORMAL HIGH (ref 70–99)

## 2013-10-04 LAB — I-STAT TROPONIN, ED: Troponin i, poc: 0 ng/mL (ref 0.00–0.08)

## 2013-10-04 LAB — PRO B NATRIURETIC PEPTIDE: PRO B NATRI PEPTIDE: 2649 pg/mL — AB (ref 0–125)

## 2013-10-04 MED ORDER — FUROSEMIDE 20 MG PO TABS: 40.0000 mg | ORAL_TABLET | Freq: Every day | ORAL | Status: DC

## 2013-10-04 MED ORDER — FAMOTIDINE 10 MG PO TABS
10.0000 mg | ORAL_TABLET | ORAL | Status: DC | PRN
  Filled 2013-10-04: qty 1

## 2013-10-04 MED ORDER — METFORMIN HCL ER 500 MG PO TB24
500.0000 mg | ORAL_TABLET | Freq: Every day | ORAL | Status: DC
  Filled 2013-10-04 (×2): qty 1

## 2013-10-04 MED ORDER — SODIUM CHLORIDE 0.9 % IJ SOLN
3.0000 mL | Freq: Two times a day (BID) | INTRAMUSCULAR | Status: DC
  Administered 2013-10-04 – 2013-10-12 (×15): 3 mL via INTRAVENOUS

## 2013-10-04 MED ORDER — ACETAMINOPHEN ER 650 MG PO TBCR: 1300.0000 mg | EXTENDED_RELEASE_TABLET | Freq: Three times a day (TID) | ORAL | Status: DC | PRN

## 2013-10-04 MED ORDER — TECHNETIUM TO 99M ALBUMIN AGGREGATED
6.0000 | Freq: Once | INTRAVENOUS | Status: AC | PRN
  Administered 2013-10-04: 6 via INTRAVENOUS

## 2013-10-04 MED ORDER — HEPARIN SODIUM (PORCINE) 5000 UNIT/ML IJ SOLN
5000.0000 [IU] | Freq: Three times a day (TID) | INTRAMUSCULAR | Status: DC
  Administered 2013-10-04 – 2013-10-12 (×23): 5000 [IU] via SUBCUTANEOUS
  Filled 2013-10-04 (×28): qty 1

## 2013-10-04 MED ORDER — ASPIRIN EC 81 MG PO TBEC
81.0000 mg | DELAYED_RELEASE_TABLET | Freq: Every day | ORAL | Status: DC
  Administered 2013-10-05 – 2013-10-12 (×8): 81 mg via ORAL
  Filled 2013-10-04 (×8): qty 1

## 2013-10-04 MED ORDER — IRBESARTAN 300 MG PO TABS
300.0000 mg | ORAL_TABLET | Freq: Every day | ORAL | Status: DC
  Administered 2013-10-05: 300 mg via ORAL
  Filled 2013-10-04 (×2): qty 1

## 2013-10-04 MED ORDER — AMLODIPINE BESYLATE 10 MG PO TABS: 10.0000 mg | ORAL_TABLET | Freq: Every day | ORAL | Status: DC

## 2013-10-04 MED ORDER — VITAMIN D3 25 MCG (1000 UNIT) PO TABS
2000.0000 [IU] | ORAL_TABLET | Freq: Every day | ORAL | Status: DC
  Administered 2013-10-05 – 2013-10-12 (×8): 2000 [IU] via ORAL
  Filled 2013-10-04 (×8): qty 2

## 2013-10-04 MED ORDER — FUROSEMIDE 10 MG/ML IJ SOLN
40.0000 mg | Freq: Once | INTRAMUSCULAR | Status: AC
  Administered 2013-10-04: 40 mg via INTRAVENOUS
  Filled 2013-10-04: qty 4

## 2013-10-04 MED ORDER — TECHNETIUM TC 99M DIETHYLENETRIAME-PENTAACETIC ACID: 40.0000 | Freq: Once | INTRAVENOUS | Status: AC | PRN

## 2013-10-04 MED ORDER — FLAXSEED OIL MAX STR 1300 MG PO CAPS: 1.0000 | ORAL_CAPSULE | Freq: Every day | ORAL | Status: DC

## 2013-10-04 MED ORDER — SIMVASTATIN 20 MG PO TABS
20.0000 mg | ORAL_TABLET | Freq: Every day | ORAL | Status: DC
  Administered 2013-10-04 – 2013-10-12 (×9): 20 mg via ORAL
  Filled 2013-10-04 (×9): qty 1

## 2013-10-04 MED ORDER — AMLODIPINE BESYLATE 10 MG PO TABS
10.0000 mg | ORAL_TABLET | Freq: Every day | ORAL | Status: DC
  Administered 2013-10-05: 10 mg via ORAL
  Filled 2013-10-04 (×2): qty 1

## 2013-10-04 MED ORDER — FAMOTIDINE 10 MG PO TABS
10.0000 mg | ORAL_TABLET | Freq: Every day | ORAL | Status: DC | PRN
Start: 2013-10-04 — End: 2013-10-12
  Filled 2013-10-04: qty 1

## 2013-10-04 MED ORDER — ACETAMINOPHEN 500 MG PO TABS: 1000.0000 mg | ORAL_TABLET | Freq: Three times a day (TID) | ORAL | Status: DC | PRN

## 2013-10-04 MED ORDER — METFORMIN HCL ER 500 MG PO TB24
1000.0000 mg | ORAL_TABLET | Freq: Every day | ORAL | Status: DC
  Filled 2013-10-04: qty 2

## 2013-10-04 MED ORDER — LIRAGLUTIDE 18 MG/3ML ~~LOC~~ SOPN
1.8000 mL | PEN_INJECTOR | Freq: Every day | SUBCUTANEOUS | Status: DC
  Administered 2013-10-06: 1.8 mL via SUBCUTANEOUS
  Administered 2013-10-07: 18:00:00 via SUBCUTANEOUS
  Administered 2013-10-08 – 2013-10-11 (×4): 1.8 mL via SUBCUTANEOUS

## 2013-10-04 MED ORDER — ADULT MULTIVITAMIN W/MINERALS CH
1.0000 | ORAL_TABLET | Freq: Every day | ORAL | Status: DC
  Administered 2013-10-05 – 2013-10-12 (×8): 1 via ORAL
  Filled 2013-10-04 (×8): qty 1

## 2013-10-04 MED ORDER — METFORMIN HCL ER 500 MG PO TB24: 500.0000 mg | ORAL_TABLET | Freq: Two times a day (BID) | ORAL | Status: DC

## 2013-10-04 MED ORDER — VITAMIN D 50 MCG (2000 UT) PO CAPS: 1.0000 | ORAL_CAPSULE | Freq: Every day | ORAL | Status: DC

## 2013-10-04 MED ORDER — NITROGLYCERIN 0.4 MG SL SUBL: 0.4000 mg | SUBLINGUAL_TABLET | SUBLINGUAL | Status: DC | PRN

## 2013-10-04 MED ORDER — ADULT MULTIVITAMIN W/MINERALS CH: 1.0000 | ORAL_TABLET | Freq: Every day | ORAL | Status: DC

## 2013-10-04 NOTE — ED Provider Notes (Signed)
CSN: [id number]     Arrival date & time 10/04/13  1638 History   First MD Initiated Contact with Patient 10/04/13 1653     Chief Complaint  Patient presents with  . Shortness of Breath     (Consider location/radiation/quality/duration/timing/severity/associated sxs/prior Treatment) HPI Anna Clay 63 y.o. with a PMH of DM, HTN, and gout presents from her PCP office for hypoxia and shortness of breath. PCP reportedly had O2 sat of 60's. SHe has had progressive dyspnea over the past 3-4 months. It is worsening. It is severe in severity. Worsened with any activities. No known relieving factors. No chest pain. She is AOx4 and speaking clearly in complete sentences and does not appear to be in significant resp distress. Denies any significant cough, congestion, abdominal pain, N/V/D, or LE edema. She had extensive workups with her PCP which was sig for likely pulmonary hypertension noted on an TTE.   Past Medical History  Diagnosis Date  . Obesity   . Chest pain   . Hypertension   . Hypercholesteremia   . Diabetes mellitus without complication   . Gout    History reviewed. No pertinent past surgical history. No family history on file. History  Substance Use Topics  . Smoking status: Current Every Day Smoker  . Smokeless tobacco: Not on file  . Alcohol Use: Yes   OB History   Grav Para Term Preterm Abortions TAB SAB Ect Mult Living                 Review of Systems  All other systems reviewed and are negative.     Allergies  Ivp dye and Tetracyclines & related  Home Medications   Prior to Admission medications   Medication Sig Start Date End Date Taking? Authorizing Provider  acetaminophen (TYLENOL) 650 MG CR tablet Take 1,300 mg by mouth every 8 (eight) hours as needed for pain.   Yes Historical Provider, MD  amLODipine (NORVASC) 10 MG tablet Take 10 mg by mouth daily.   Yes Historical Provider, MD  aspirin EC 81 MG tablet Take 81 mg by mouth daily.   Yes Historical  Provider, MD  Cholecalciferol (VITAMIN D) 2000 UNITS CAPS Take 1 capsule by mouth daily.   Yes Historical Provider, MD  Flaxseed, Linseed, (FLAXSEED OIL MAX STR) 1300 MG CAPS Take 1 capsule by mouth daily.   Yes Historical Provider, MD  furosemide (LASIX) 40 MG tablet Take 40 mg by mouth daily.   Yes Historical Provider, MD  Liraglutide (VICTOZA) 18 MG/3ML SOPN Inject 1.8 mLs into the skin daily.   Yes Historical Provider, MD  metFORMIN (GLUMETZA) 500 MG (MOD) 24 hr tablet Take 500-1,000 mg by mouth 2 (two) times daily. Take 1 tablet in AM and 2 tablet in PM   Yes Historical Provider, MD  Multiple Vitamin (MULTIVITAMIN WITH MINERALS) TABS tablet Take 1 tablet by mouth daily.   Yes Historical Provider, MD  nitroGLYCERIN (NITROSTAT) 0.4 MG SL tablet Place 0.4 mg under the tongue every 5 (five) minutes as needed for chest pain.   Yes Historical Provider, MD  olmesartan (BENICAR) 40 MG tablet Take 40 mg by mouth daily.   Yes Historical Provider, MD  ranitidine (ZANTAC) 150 MG tablet Take 150 mg by mouth daily as needed for heartburn.   Yes Historical Provider, MD  simvastatin (ZOCOR) 20 MG tablet Take 20 mg by mouth daily.   Yes Historical Provider, MD  STUDY MEDICATION Take 2 capsules by mouth daily. Take 1 capsule from  each bottle [id number] and [id number]) every morning. [employer], Protocol No. [id number]. Study of Febuxostat, Allopurinol, or Placebo.   Yes Historical Provider, MD   BP 113/68  Pulse 99  Temp(Src) 98 F (36.7 C) (Oral)  Resp 18  SpO2 90% Physical Exam  Constitutional: She is oriented to person, place, and time. She appears well-developed and well-nourished. No distress.  HENT:  Head: Normocephalic and atraumatic.  Right Ear: External ear normal.  Left Ear: External ear normal.  Eyes: Conjunctivae and EOM are normal. Right eye exhibits no discharge. Left eye exhibits no discharge.  Neck: Normal range of motion. Neck supple. No JVD present.  Cardiovascular: Regular rhythm  and normal heart sounds.  Tachycardia present.  Exam reveals no gallop and no friction rub.   No murmur heard. Pulmonary/Chest: No accessory muscle usage or stridor. Tachypnea noted. No apnea. No respiratory distress. She has no wheezes. She has rhonchi in the right upper field, the right middle field, the right lower field, the left upper field, the left middle field and the left lower field. She has rales in the right middle field, the right lower field, the left middle field and the left lower field. She exhibits no tenderness.  Abdominal: Soft. Bowel sounds are normal. She exhibits no distension. There is no tenderness. There is no rebound and no guarding.  Musculoskeletal: Normal range of motion. She exhibits no edema.  Neurological: She is alert and oriented to person, place, and time.  Skin: Skin is warm. No rash noted. She is not diaphoretic.  Psychiatric: She has a normal mood and affect. Her behavior is normal.    ED Course  Procedures (including critical care time) Labs Review Labs Reviewed  CBC WITH DIFFERENTIAL - Abnormal; Notable for the following:    RBC 6.06 (*)    Hemoglobin 11.9 (*)    MCV 69.5 (*)    MCH 19.6 (*)    MCHC 28.3 (*)    RDW 23.6 (*)    All other components within normal limits  COMPREHENSIVE METABOLIC PANEL - Abnormal; Notable for the following:    Glucose, Bld 115 (*)    BUN 46 (*)    Creatinine, Ser 1.32 (*)    Albumin 3.0 (*)    GFR calc non Af Amer 42 (*)    GFR calc Af Amer 49 (*)    All other components within normal limits  PRO B NATRIURETIC PEPTIDE - Abnormal; Notable for the following:    Pro B Natriuretic peptide (BNP) 2649.0 (*)    All other components within normal limits  CBG MONITORING, ED - Abnormal; Notable for the following:    Glucose-Capillary 120 (*)    All other components within normal limits  MRSA PCR SCREENING  CBC  BASIC METABOLIC PANEL  I-STAT TROPOININ, ED    Imaging Review Nm Pulmonary Perf And Vent  10/04/2013    CLINICAL DATA:  Shortness of breath for 2 months.  EXAM: NUCLEAR MEDICINE VENTILATION - PERFUSION LUNG SCAN  TECHNIQUE: Ventilation images were obtained in multiple projections using inhaled aerosol technetium 99 M DTPA. Perfusion images were obtained in multiple projections after intravenous injection of Tc-62m MAA.  RADIOPHARMACEUTICALS:  40 mCi Tc-61m DTPA aerosol and 6 mCi Tc-28m MAA  COMPARISON:  Chest radiograph performed earlier today at 5:24 p.m.  FINDINGS: Ventilation: No significant focal ventilation defect is identified, aside from the shadow of the patient's mildly enlarged heart. Increased accumulation of activity at the left perihilar region is likely artifactual in nature.  Perfusion: No wedge shaped peripheral perfusion defects to suggest acute pulmonary embolism.  IMPRESSION: Low probability for pulmonary embolus.   Electronically Signed   By: Garald Balding M.D.   On: 10/04/2013 22:47   Dg Chest Portable 1 View  10/04/2013   CLINICAL DATA:  Shortness of breath. Cough and congestion. COPD. Diabetes. Tobacco use.  EXAM: PORTABLE CHEST - 1 VIEW  COMPARISON:  01/20/2008  FINDINGS: Moderately enlarged cardiopericardial silhouette with diffuse interstitial opacity and mild right perihilar airspace opacity.  Low lung volumes.  IMPRESSION: 1. Moderate cardiomegaly with interstitial opacity and mild right perihilar airspace opacity compatible with acute pulmonary edema.   Electronically Signed   By: Sherryl Barters M.D.   On: 10/04/2013 17:43     EKG Interpretation   Date/Time:  Tuesday October 04 2013 16:48:48 EDT Ventricular Rate:  104 PR Interval:  156 QRS Duration: 60 QT Interval:  304 QTC Calculation: 399 R Axis:   114 Text Interpretation:  Sinus tachycardia Right atrial enlargement Right  axis deviation Low voltage QRS Nonspecific ST and T wave abnormality  Abnormal ECG Confirmed by RAY MD, Andee Poles [id number]) on 10/04/2013 5:12:22 PM      MDM   Final diagnoses:  Pulmonary  hypertension  Acute right-sided CHF (congestive heart failure)  AKI (acute kidney injury)  Hypoxia  Acute respiratory failure with hypoxia    Pt presents with anew O2 requirement and progressively worsening dyspnea. Normotensive and slightly tachycardic. Does not appear to be in significant respiratory distress but O2 sats are 95% on NRBM. Rales and rhonchi on exam but speaking clearly. WU with PCP concerning for pulmonary hypertension. Considered PE but she has a allergy to contrast and has AKI, deferred contrast imaging. BNP elevated. + pulm edema on CXR. Patient was admitted to the hospitalist in the step down unit. Pulmonary consulted for medical management in a patient with suspected pulmonary hypertension. Care discussed with my attending, Dr. Jeanell Sparrow.      Kelby Aline, MD 10/05/13 [phone]

## 2013-10-04 NOTE — H&P (Signed)
PULMONARY / CRITICAL CARE MEDICINE   Name: Anna Clay MRN: [id number] DOB: 1950/11/13    ADMISSION DATE:  10/04/2013 CONSULTATION DATE:  10/04/2013  REFERRING MD :  EDP  CHIEF COMPLAINT:  Dyspnea  INITIAL PRESENTATION: 63 year old female presented to [facility] ED 9/15 with SOB. SpO2 was in 60s on RA at time of admission. Improved to high 80s on 5L [place]. CXR consistent with Pulmonary edema. PCCM asked to see.   STUDIES:  9/15 BLE Doppler >>>  SIGNIFICANT EVENTS: 9/15 admitted   HISTORY OF PRESENT ILLNESS:  63 year old female with PMH as below, which includes DM and obesity. She has been struggling with "fluid" for the past few months. She has had peripheral edema, cough productive for clear sputum, and a 30 pound weight gain over the past 2-3 months. 9/15 she presented to ED after being found profoundly hypoxic at PCP office (sats 65). She was placed on supplemental O2 and is comfortable. CXR suspicious for pulmonary edema. PCCM asked to see.    PAST MEDICAL HISTORY :  Past Medical History  Diagnosis Date  . Obesity   . Chest pain   . Hypertension   . Hypercholesteremia   . Diabetes mellitus without complication   . Gout    History reviewed. No pertinent past surgical history. Prior to Admission medications   Medication Sig Start Date End Date Taking? Authorizing Provider  acetaminophen (TYLENOL) 650 MG CR tablet Take 1,300 mg by mouth every 8 (eight) hours as needed for pain.   Yes Historical Provider, MD  amLODipine (NORVASC) 10 MG tablet Take 10 mg by mouth daily.   Yes Historical Provider, MD  aspirin EC 81 MG tablet Take 81 mg by mouth daily.   Yes Historical Provider, MD  Cholecalciferol (VITAMIN D) 2000 UNITS CAPS Take 1 capsule by mouth daily.   Yes Historical Provider, MD  Flaxseed, Linseed, (FLAXSEED OIL MAX STR) 1300 MG CAPS Take 1 capsule by mouth daily.   Yes Historical Provider, MD  furosemide (LASIX) 40 MG tablet Take 40 mg by mouth daily.   Yes Historical Provider, MD   Liraglutide (VICTOZA) 18 MG/3ML SOPN Inject 1.8 mLs into the skin daily.   Yes Historical Provider, MD  metFORMIN (GLUMETZA) 500 MG (MOD) 24 hr tablet Take 500-1,000 mg by mouth 2 (two) times daily. Take 1 tablet in AM and 2 tablet in PM   Yes Historical Provider, MD  Multiple Vitamin (MULTIVITAMIN WITH MINERALS) TABS tablet Take 1 tablet by mouth daily.   Yes Historical Provider, MD  nitroGLYCERIN (NITROSTAT) 0.4 MG SL tablet Place 0.4 mg under the tongue every 5 (five) minutes as needed for chest pain.   Yes Historical Provider, MD  olmesartan (BENICAR) 40 MG tablet Take 40 mg by mouth daily.   Yes Historical Provider, MD  ranitidine (ZANTAC) 150 MG tablet Take 150 mg by mouth daily as needed for heartburn.   Yes Historical Provider, MD  simvastatin (ZOCOR) 20 MG tablet Take 20 mg by mouth daily.   Yes Historical Provider, MD  STUDY MEDICATION Take 2 capsules by mouth daily. Take 1 capsule from each bottle [id number] and [id number]) every morning. [employer], Protocol No. [id number]. Study of Febuxostat, Allopurinol, or Placebo.   Yes Historical Provider, MD   Allergies  Allergen Reactions  . Ivp Dye [Iodinated Diagnostic Agents] Other (See Comments)    Reaction unknown  . Tetracyclines & Related Other (See Comments)    Reaction unknown    FAMILY HISTORY:  No  family history on file. SOCIAL HISTORY:  reports that she has been smoking.  She does not have any smokeless tobacco history on file. She reports that she drinks alcohol. She reports that she does not use illicit drugs.  REVIEW OF SYSTEMS:   Bolds are positive  Constitutional: weight gain, night sweats, Fevers, chills, fatigue .  HEENT: headaches, Sore throat, sneezing, nasal congestion, post nasal drip, Difficulty swallowing, Tooth/dental problems, visual complaints visual changes, ear ache CV:  chest pain, radiates: ,Orthopnea, PND, swelling in lower extremities, dizziness, palpitations, syncope.  GI  heartburn, indigestion,  abdominal pain, nausea, vomiting, diarrhea, change in bowel habits, loss of appetite, bloody stools.  Resp: cough, productive clear thin sputum: , hemoptysis, dyspnea, chest pain, pleuritic.  Skin: rash or itching or icterus GU: dysuria, change in color of urine, urgency or frequency. flank pain, hematuria  MS: joint pain or swelling. decreased range of motion  Psych: change in mood or affect. depression or anxiety.  Neuro: difficulty with speech, weakness, numbness, ataxia    SUBJECTIVE:   VITAL SIGNS: Pulse Rate:  [102-103] 102 (09/15 1806) Resp:  [16-18] 16 (09/15 1806) BP: (113-120)/(68-83) 113/68 mmHg (09/15 1806) SpO2:  [91 %-98 %] 91 % (09/15 1820) HEMODYNAMICS:   VENTILATOR SETTINGS:   INTAKE / OUTPUT: No intake or output data in the 24 hours ending 10/04/13 2007  PHYSICAL EXAMINATION: General:  Morbidly obese female in minimal distress on 5L [place]   Neuro:  Alert, oriented, no focal deficit HEENT:  [place]/AT, PERRL Cardiovascular:  RRR, no MRG Lungs:  resps even, minimally labored, crackles throughout.  Abdomen:  Obese, soft, non-tender Musculoskeletal:  No acute deformity, pitting edema BLE  Skin:  Intact, MMM  LABS:  CBC  Recent Labs Lab 10/04/13 1649  WBC 9.7  HGB 11.9*  HCT 42.1  PLT 178   Coag's No results found for this basename: APTT, INR,  in the last 168 hours BMET  Recent Labs Lab 10/04/13 1649  NA 139  K 4.8  CL 98  CO2 27  BUN 46*  CREATININE 1.32*  GLUCOSE 115*   Electrolytes  Recent Labs Lab 10/04/13 1649  CALCIUM 9.8   Sepsis Markers No results found for this basename: LATICACIDVEN, PROCALCITON, O2SATVEN,  in the last 168 hours ABG No results found for this basename: PHART, PCO2ART, PO2ART,  in the last 168 hours Liver Enzymes  Recent Labs Lab 10/04/13 1649  AST 17  ALT 9  ALKPHOS 60  BILITOT 0.8  ALBUMIN 3.0*   Cardiac Enzymes  Recent Labs Lab 10/04/13 1649  PROBNP 2649.0*   Glucose No results found for this  basename: GLUCAP,  in the last 168 hours  Imaging No results found.   ASSESSMENT / PLAN:  Acute hypoxic respiratory failure Pulmonary Edema ? [place] PE - Supplemental O2 as needed to maintain SpO2 greater than 90% - Would likely respond to BiPAP if condition worsens - Aggressive diuresis as tolerated - CXR in able - 2d Echo - If no obvious CHF will need PAH workup - Will follow up PE studies  AKI  - Management per primary team  Georgann Housekeeper, ACNP [place] [facility] Pager [phone] or [phone]  Hypoxic failure likely due to combination of pulmonary edema, patient up 30 lbs in a month and reports PND and orthopnea.  Complicating the picture however is the likelihood of pulmonary HTN (reported on an outside echo).  PE is low on the list but primary already ordered a V/Q scan and would be  a nice addition for work up for pulmonary HTN.  Recommend diureses as kidney function allows, work up for pulmonary HTN to include a sleep study and auto-immune work-up  Ok to admit to SDU under [place] and PCCM will f/u as consultants.  I have personally obtained a history, examined the patient, evaluated laboratory and imaging results, formulated the assessment and plan and placed orders.  Patient seen and examined, agree with above note.  I dictated the care and orders written for this patient under my direction.  Rush Farmer, MD [phone]

## 2013-10-04 NOTE — Progress Notes (Signed)
PHARMACIST - PHYSICIAN ORDER COMMUNICATION  CONCERNING: P&T Medication Policy on Herbal Medications  DESCRIPTION:  This patient's order for:  Flaxseed  has been noted.  This product(s) is classified as an "herbal" or natural product. Due to a lack of definitive safety studies or FDA approval, nonstandard manufacturing practices, plus the potential risk of unknown drug-drug interactions while on inpatient medications, the Pharmacy and Therapeutics Committee does not permit the use of "herbal" or natural products of this type within [facility].   ACTION TAKEN: The pharmacy department is unable to verify this order at this time and your patient has been informed of this safety policy. Please reevaluate patient's clinical condition at discharge and address if the herbal or natural product(s) should be resumed at that time.  Dia Sitter, PharmD, BCPS

## 2013-10-04 NOTE — H&P (Signed)
[facility] History and Physical  TOMORROW AMORIM [id number] DOB: [birth year] DOA: 10/04/2013  Referring physician: EDP PCP: Maximino Greenland, MD   Chief Complaint: SOB   HPI: Anna Clay is a 63 y.o. female who presents to the ED with several week to month history of progressively worsening SOB.  Symptoms have been associated with peripheral edema.  No CP.  Minimal non-productive cough.  Extensive work up at PCPs office including 2d echo and NM stress test have revealed pulmonary HTN with pulmonary artery pressure of 60, moderately dilated RV.  Due to severe hypoxia today at PCPs office (O2 sats in the 60s-70s) patient was sent in to the ED, now sating mid 80s but comfortable on 5L [place].  Review of Systems: Systems reviewed.  As above, otherwise negative  Past Medical History  Diagnosis Date  . Obesity   . Chest pain   . Hypertension   . Hypercholesteremia   . Diabetes mellitus without complication   . Gout    History reviewed. No pertinent past surgical history. Social History:  reports that she has been smoking.  She does not have any smokeless tobacco history on file. She reports that she drinks alcohol. She reports that she does not use illicit drugs.  Allergies  Allergen Reactions  . Ivp Dye [Iodinated Diagnostic Agents] Other (See Comments)    Reaction unknown  . Tetracyclines & Related Other (See Comments)    Reaction unknown    No family history on file.   Prior to Admission medications   Medication Sig Start Date End Date Taking? Authorizing Provider  acetaminophen (TYLENOL) 650 MG CR tablet Take 1,300 mg by mouth every 8 (eight) hours as needed for pain.   Yes Historical Provider, MD  amLODipine (NORVASC) 10 MG tablet Take 10 mg by mouth daily.   Yes Historical Provider, MD  aspirin EC 81 MG tablet Take 81 mg by mouth daily.   Yes Historical Provider, MD  Cholecalciferol (VITAMIN D) 2000 UNITS CAPS Take 1 capsule by mouth daily.   Yes Historical Provider, MD   Flaxseed, Linseed, (FLAXSEED OIL MAX STR) 1300 MG CAPS Take 1 capsule by mouth daily.   Yes Historical Provider, MD  furosemide (LASIX) 40 MG tablet Take 40 mg by mouth daily.   Yes Historical Provider, MD  Liraglutide (VICTOZA) 18 MG/3ML SOPN Inject 1.8 mLs into the skin daily.   Yes Historical Provider, MD  metFORMIN (GLUMETZA) 500 MG (MOD) 24 hr tablet Take 500-1,000 mg by mouth 2 (two) times daily. Take 1 tablet in AM and 2 tablet in PM   Yes Historical Provider, MD  Multiple Vitamin (MULTIVITAMIN WITH MINERALS) TABS tablet Take 1 tablet by mouth daily.   Yes Historical Provider, MD  nitroGLYCERIN (NITROSTAT) 0.4 MG SL tablet Place 0.4 mg under the tongue every 5 (five) minutes as needed for chest pain.   Yes Historical Provider, MD  olmesartan (BENICAR) 40 MG tablet Take 40 mg by mouth daily.   Yes Historical Provider, MD  ranitidine (ZANTAC) 150 MG tablet Take 150 mg by mouth daily as needed for heartburn.   Yes Historical Provider, MD  simvastatin (ZOCOR) 20 MG tablet Take 20 mg by mouth daily.   Yes Historical Provider, MD  STUDY MEDICATION Take 2 capsules by mouth daily. Take 1 capsule from each bottle [id number] and [id number]) every morning. [employer], Protocol No. [id number]. Study of Febuxostat, Allopurinol, or Placebo.   Yes Historical Provider, MD   Physical Exam: Filed Vitals:   10/04/13  1806  BP: 113/68  Pulse: 102  Resp: 16    BP 113/68  Pulse 102  Resp 16  SpO2 91%  General Appearance:    Alert, oriented, no distress, appears stated age  Head:    Normocephalic, atraumatic  Eyes:    PERRL, EOMI, sclera non-icteric        Nose:   Nares without drainage or epistaxis. Mucosa, turbinates normal  Throat:   Moist mucous membranes. Oropharynx without erythema or exudate.  Neck:   Supple. No carotid bruits.  No thyromegaly.  No lymphadenopathy.   Back:     No CVA tenderness, no spinal tenderness  Lungs:     B rhonchi, no wheezing.  Chest wall:    No tenderness to  palpitation  Heart:    Regular rate and rhythm without murmurs, gallops, rubs  Abdomen:     Soft, non-tender, nondistended, normal bowel sounds, no organomegaly  Genitalia:    deferred  Rectal:    deferred  Extremities:   No clubbing, cyanosis or edema.  Pulses:   2+ and symmetric all extremities  Skin:   Skin color, texture, turgor normal, no rashes or lesions  Lymph nodes:   Cervical, supraclavicular, and axillary nodes normal  Neurologic:   CNII-XII intact. Normal strength, sensation and reflexes      throughout    Labs on Admission:  Basic Metabolic Panel:  Recent Labs Lab 10/04/13 1649  NA 139  K 4.8  CL 98  CO2 27  GLUCOSE 115*  BUN 46*  CREATININE 1.32*  CALCIUM 9.8   Liver Function Tests:  Recent Labs Lab 10/04/13 1649  AST 17  ALT 9  ALKPHOS 60  BILITOT 0.8  PROT 6.8  ALBUMIN 3.0*   No results found for this basename: LIPASE, AMYLASE,  in the last 168 hours No results found for this basename: AMMONIA,  in the last 168 hours CBC:  Recent Labs Lab 10/04/13 1649  WBC 9.7  NEUTROABS 6.7  HGB 11.9*  HCT 42.1  MCV 69.5*  PLT 178   Cardiac Enzymes: No results found for this basename: CKTOTAL, CKMB, CKMBINDEX, TROPONINI,  in the last 168 hours  BNP (last 3 results)  Recent Labs  10/04/13 1649  PROBNP 2649.0*   CBG: No results found for this basename: GLUCAP,  in the last 168 hours  Radiological Exams on Admission: Dg Chest Portable 1 View  10/04/2013   CLINICAL DATA:  Shortness of breath. Cough and congestion. COPD. Diabetes. Tobacco use.  EXAM: PORTABLE CHEST - 1 VIEW  COMPARISON:  01/20/2008  FINDINGS: Moderately enlarged cardiopericardial silhouette with diffuse interstitial opacity and mild right perihilar airspace opacity.  Low lung volumes.  IMPRESSION: 1. Moderate cardiomegaly with interstitial opacity and mild right perihilar airspace opacity compatible with acute pulmonary edema.   Electronically Signed   By: Sherryl Barters M.D.   On:  10/04/2013 17:43    EKG: Independently reviewed.  Assessment/Plan Principal Problem:   Pulmonary hypertension Active Problems:   Acute right-sided CHF (congestive heart failure)   Hypoxia   AKI (acute kidney injury)   1. Pulmonary HTN - Despite profound hypoxia patient appears quite comfortable and talking in full sentences while on [place] suggesting a large chronic component to her respiratory failure.  Pulm consulted, will try 40 mg IV lasix now to see if this helps with the pulmonary edema seen on CXR but likely will need more specific pulmonary HTN treatment.  2d echo not ordered as this has already been  done as outpatient and patient has results with her.  Venous duplex BLE and NM VQ scan have been ordered due to patient having anaphylaxis with IV contrast dye. 2. AKI - monitor the creatinine closely with repeat BMP tomorrow AM especially as we are giving a dose of IV lasix in ED.  Creatinine 1.3 today up from 1.0 back in 2009 so only a very mild elevation.  Will monitor at this point, not planning on stopping any home meds specifically unless this elevates further.  Did hold off on ordering her scheduled daily PO lasix for now though as we are giving IV lasix.  Pulm has been consulted.  Code Status: Full Code  Family Communication: Family at bedside Disposition Plan: Admit to inpatient   Time spent: 70 min  Houda Brau M. [facility] Pager [phone]  If 7AM-7PM, please contact the day team taking care of the patient [email or web address] Password [email or web address] 10/04/2013, 7:56 PM

## 2013-10-04 NOTE — ED Notes (Signed)
Pt presents to department for evaluation of SOB and low oxygen saturation from PCP office. Pt states SOB intermittent for several months. Pt speaking short phrases upon arrival. Denies pain.

## 2013-10-05 DIAGNOSIS — D649 Anemia, unspecified: Secondary | ICD-10-CM | POA: Diagnosis present

## 2013-10-05 DIAGNOSIS — E119 Type 2 diabetes mellitus without complications: Secondary | ICD-10-CM | POA: Diagnosis present

## 2013-10-05 DIAGNOSIS — G4733 Obstructive sleep apnea (adult) (pediatric): Secondary | ICD-10-CM | POA: Diagnosis present

## 2013-10-05 LAB — BASIC METABOLIC PANEL
Anion gap: 9 (ref 5–15)
BUN: 45 mg/dL — AB (ref 6–23)
CHLORIDE: 98 meq/L (ref 96–112)
CO2: 30 mEq/L (ref 19–32)
Calcium: 9.3 mg/dL (ref 8.4–10.5)
Creatinine, Ser: 1.38 mg/dL — ABNORMAL HIGH (ref 0.50–1.10)
GFR calc non Af Amer: 40 mL/min — ABNORMAL LOW (ref >90)
GFR, EST AFRICAN AMERICAN: 46 mL/min — AB (ref >90)
GLUCOSE: 125 mg/dL — AB (ref 70–99)
POTASSIUM: 4.8 meq/L (ref 3.7–5.3)
Sodium: 137 mEq/L (ref 137–147)

## 2013-10-05 LAB — TROPONIN I
Troponin I: <0.3 ng/mL (ref <0.30)
Troponin I: <0.3 ng/mL (ref <0.30)

## 2013-10-05 LAB — CBC
HCT: 39 % (ref 36.0–46.0)
Hemoglobin: 10.9 g/dL — ABNORMAL LOW (ref 12.0–15.0)
MCH: 19.3 pg — ABNORMAL LOW (ref 26.0–34.0)
MCHC: 27.9 g/dL — ABNORMAL LOW (ref 30.0–36.0)
MCV: 69 fL — ABNORMAL LOW (ref 78.0–100.0)
Platelets: 168 10*3/uL (ref 150–400)
RBC: 5.65 MIL/uL — AB (ref 3.87–5.11)
RDW: 23.4 % — AB (ref 11.5–15.5)
WBC: 7.6 10*3/uL (ref 4.0–10.5)

## 2013-10-05 LAB — GLUCOSE, CAPILLARY
GLUCOSE-CAPILLARY: 156 mg/dL — AB (ref 70–99)
Glucose-Capillary: 122 mg/dL — ABNORMAL HIGH (ref 70–99)
Glucose-Capillary: 202 mg/dL — ABNORMAL HIGH (ref 70–99)
Glucose-Capillary: 142 mg/dL — ABNORMAL HIGH (ref 70–99)

## 2013-10-05 LAB — HEMOGLOBIN A1C
Hgb A1c MFr Bld: 7.7 % — ABNORMAL HIGH (ref <5.7)
Mean Plasma Glucose: 174 mg/dL — ABNORMAL HIGH (ref <117)

## 2013-10-05 LAB — MRSA PCR SCREENING: MRSA BY PCR: NEGATIVE

## 2013-10-05 MED ORDER — INFLUENZA VAC SPLIT QUAD 0.5 ML IM SUSY
0.5000 mL | PREFILLED_SYRINGE | INTRAMUSCULAR | Status: AC
  Administered 2013-10-06: 0.5 mL via INTRAMUSCULAR
  Filled 2013-10-05: qty 0.5

## 2013-10-05 MED ORDER — INSULIN ASPART 100 UNIT/ML ~~LOC~~ SOLN
0.0000 [IU] | Freq: Three times a day (TID) | SUBCUTANEOUS | Status: DC
  Administered 2013-10-05: 5 [IU] via SUBCUTANEOUS
  Administered 2013-10-06: 3 [IU] via SUBCUTANEOUS
  Administered 2013-10-07: 5 [IU] via SUBCUTANEOUS
  Administered 2013-10-07 – 2013-10-08 (×2): 2 [IU] via SUBCUTANEOUS
  Administered 2013-10-08: 3 [IU] via SUBCUTANEOUS
  Administered 2013-10-09: 5 [IU] via SUBCUTANEOUS
  Administered 2013-10-09 (×2): 3 [IU] via SUBCUTANEOUS
  Administered 2013-10-10: 2 [IU] via SUBCUTANEOUS
  Administered 2013-10-10: 3 [IU] via SUBCUTANEOUS
  Administered 2013-10-11: 2 [IU] via SUBCUTANEOUS
  Administered 2013-10-11: 3 [IU] via SUBCUTANEOUS
  Administered 2013-10-11 – 2013-10-12 (×2): 2 [IU] via SUBCUTANEOUS

## 2013-10-05 MED ORDER — FUROSEMIDE 10 MG/ML IJ SOLN
80.0000 mg | Freq: Two times a day (BID) | INTRAMUSCULAR | Status: DC
  Administered 2013-10-05 – 2013-10-10 (×11): 80 mg via INTRAVENOUS
  Filled 2013-10-05 (×13): qty 8

## 2013-10-05 MED ORDER — LIRAGLUTIDE 18 MG/3ML ~~LOC~~ SOPN
1.8000 mL | PEN_INJECTOR | Freq: Once | SUBCUTANEOUS | Status: AC
  Administered 2013-10-05: 1.8 mL via SUBCUTANEOUS

## 2013-10-05 MED ORDER — NICOTINE 7 MG/24HR TD PT24
7.0000 mg | MEDICATED_PATCH | Freq: Every day | TRANSDERMAL | Status: DC
  Administered 2013-10-05 – 2013-10-12 (×8): 7 mg via TRANSDERMAL
  Filled 2013-10-05 (×11): qty 1

## 2013-10-05 MED ORDER — PNEUMOCOCCAL VAC POLYVALENT 25 MCG/0.5ML IJ INJ
0.5000 mL | INJECTION | INTRAMUSCULAR | Status: AC
  Administered 2013-10-06: 0.5 mL via INTRAMUSCULAR
  Filled 2013-10-05: qty 0.5

## 2013-10-05 MED ORDER — ACETAMINOPHEN 325 MG PO TABS: 650.0000 mg | ORAL_TABLET | Freq: Four times a day (QID) | ORAL | Status: DC | PRN

## 2013-10-05 MED ORDER — INSULIN ASPART 100 UNIT/ML ~~LOC~~ SOLN
0.0000 [IU] | Freq: Every day | SUBCUTANEOUS | Status: DC
Start: 2013-10-05 — End: 2013-10-12
  Administered 2013-10-09: 3 [IU] via SUBCUTANEOUS
  Administered 2013-10-11: 2 [IU] via SUBCUTANEOUS

## 2013-10-05 MED ORDER — FUROSEMIDE 10 MG/ML IJ SOLN: 60.0000 mg | Freq: Two times a day (BID) | INTRAMUSCULAR | Status: DC

## 2013-10-05 NOTE — Progress Notes (Signed)
[name]  Anna Clay [id number] DOB: 18-Apr-1950 DOA: 10/04/2013 PCP: Maximino Greenland, MD  Brief narrative: 63 year old female patient with a known history of diabetes, hypertension, dyslipidemia and obstructive sleep apnea previously treated with tonsillectomy/adenoidectomy and uvuloplasty. Unfortunately patient did not followup and have an additional sleep study done after that procedure to determine if sleep apnea physiology had improved. She had been having chest discomfort over several months and underwent a stress test with her cardiologist Dr. Einar Gip which was negative. He also performed a 2-D echocardiogram which revealed moderate to severe pulmonary hypertension of 60 mmHg with associated moderately dilated RV. She continued to have issues with progressive shortness of breath and peripheral edema and reported a 30 pound weight gain over the past several months. She denied further chest pain. During evaluation at her primary care physician's office she was noted to be quite hypoxemic - O2 sats between 60 and 70% range. She was sent to the emergency department for further evaluation.  Upon presentation to the ER she was hypoxemic and despite application of 5 L nasal cannula oxygen she was still satting in the mid 80s. EKG was nonischemic in appearance. Chest x-ray was consistent with moderate cardiomegaly with interstitial opacities and a mild right perihilar airspace opacity compatible with acute pulmonary edema. There were also concerns for possible PE so VQ scan was ordered but demonstrated low probability for PE.  After admission she was evaluated by pulmonary medicine who recommended continuing current therapy of diuresis noting at presentation patient does have mild acute kidney injury. A complete pulmonary hypertension workup was initiated including an autoimmune serology workup. Sleep study was recommended but will need to be completed in the  outpatient setting.  HPI/Subjective: Endorses marked improvement in respiratory symptoms since admission. Has never tried CPAP but is willing to utilize while here. Currently no chest pain but still with minimal shortness of breath with exertion. States feels better sitting upright in chair.  Assessment/Plan:    Acute respiratory failure with hypoxia due to:   A) Pulmonary hypertension   B) Acute right-sided CHF (congestive heart failure) / Pulmonary HTN -Continue supportive care/wean oxygen as tolerated-appreciate pulmonary medicine assistance-increase Lasix dosage and monitor urinary output closely-continue Norvasc for afterload reduction-has significant RV dilatation so monitor for dizziness or presyncopal symptoms       Acute kidney injury -Continue ARB but discontinue metformin-follow renal function closely with diuresis    Diabetes mellitus -Metformin discontinued as noted above -begin sliding scale insulin-check hemoglobin A1c-if unable to control CBGs with short acting insulin we'll need to add temporary long acting insulin while acutely hospitalized    OSA (obstructive sleep apnea) -Formally diagnosed several years ago but treated with airway surgery but never followed up to determine if sleep apnea physiology had improved postoperatively-will need formal sleep study as an outpatient-begin hour of sleep CPAP acutely-suspect inadequately treated sleep apnea primary etiology to patient's pulmonary hypertension and right-sided heart failure    Anemia -Check TSH and anemia panel-consider check stools for blood but likely can be accomplished in the outpatient setting  DVT prophylaxis: Subcutaneous heparin Code Status: Full Family Communication: No family at bedside Disposition Plan/Expected LOS: Remain in step down  Consultants: Pulmonary medicine  Procedures: Bilateral lower extremity duplex pending  Antibiotics: None  Objective: Blood pressure 93/54, pulse 92, temperature  98 F (36.7 C), temperature source Oral, resp. rate 23, height 5\' 3"  (1.6 m), weight 295 lb 6.7 oz (134 kg), SpO2  89.00%.  Intake/Output Summary (Last 24 hours) at 10/05/13 1036 Last data filed at 10/05/13 0400  Gross per 24 hour  Intake      3 ml  Output    600 ml  Net   -597 ml   Exam: Gen: Mild respiratory distress as evidenced by dyspnea on exertion Chest: Lung sounds very difficult to auscultate due to large body habitus, scattered diffuse crackles primarily in bases Cardiac: Regular rate and rhythm, S1-S2, no rubs murmurs or gallops, 2+ peripheral edema Abdomen: Soft, obese nontender without obvious hepatosplenomegaly, no ascites Extremities: Symmetrical in appearance without cyanosis, clubbing or effusion  Scheduled Meds:  Scheduled Meds: . amLODipine  10 mg Oral Daily  . aspirin EC  81 mg Oral Daily  . cholecalciferol  2,000 Units Oral Daily  . furosemide  80 mg Intravenous Q12H  . heparin  5,000 Units Subcutaneous 3 times per day  . [START ON 10/06/2013] Influenza vac split quadrivalent PF  0.5 mL Intramuscular Tomorrow-1000  . insulin aspart  0-15 Units Subcutaneous TID WC  . insulin aspart  0-5 Units Subcutaneous QHS  . irbesartan  300 mg Oral Daily  . Liraglutide  1.8 mL Subcutaneous Daily  . multivitamin with minerals  1 tablet Oral Daily  . [START ON 10/06/2013] pneumococcal 23 valent vaccine  0.5 mL Intramuscular Tomorrow-1000  . simvastatin  20 mg Oral Daily  . sodium chloride  3 mL Intravenous Q12H   Data Reviewed: Basic Metabolic Panel:  Recent Labs Lab 10/04/13 1649 10/05/13 0308  NA 139 137  K 4.8 4.8  CL 98 98  CO2 27 30  GLUCOSE 115* 125*  BUN 46* 45*  CREATININE 1.32* 1.38*  CALCIUM 9.8 9.3   Liver Function Tests:  Recent Labs Lab 10/04/13 1649  AST 17  ALT 9  ALKPHOS 60  BILITOT 0.8  PROT 6.8  ALBUMIN 3.0*   CBC:  Recent Labs Lab 10/04/13 1649 10/05/13 0308  WBC 9.7 7.6  NEUTROABS 6.7  --   HGB 11.9* 10.9*  HCT 42.1 39.0    MCV 69.5* 69.0*  PLT 178 168   Cardiac Enzymes:  Recent Labs Lab 10/05/13 0850  TROPONINI <0.30   CBG:  Recent Labs Lab 10/04/13 2041 10/05/13 0734  GLUCAP 120* 122*    Recent Results (from the past 240 hour(s))  MRSA PCR SCREENING     Status: None   Collection Time    10/04/13  9:12 PM      Result Value Ref Range Status   MRSA by PCR NEGATIVE  NEGATIVE Final   Comment:            The GeneXpert MRSA Assay (FDA     approved for NASAL specimens     only), is one component of a     comprehensive MRSA colonization     surveillance program. It is not     intended to diagnose MRSA     infection nor to guide or     monitor treatment for     MRSA infections.     Studies:  Recent x-ray studies have been reviewed in detail by the Attending Physician  Time spent :  [place], ANP [facility] Office  [phone] Pager [phone]  On-Call/Text Page:      [email or web address]      password [email or web address]  If 7PM-7AM, please contact night-coverage [email or web address] Password [email or web address] 10/05/2013, 10:36 AM   LOS: 1 day   I have personally  examined this patient and reviewed the entire database. I have reviewed the above note, made any necessary editorial changes, and agree with its content.  Cherene Altes, MD [facility]

## 2013-10-05 NOTE — Progress Notes (Signed)
Utilization Review Completed.  

## 2013-10-05 NOTE — Progress Notes (Signed)
PULMONARY / CRITICAL CARE MEDICINE   Name: Anna Clay MRN: [id number] DOB: [birth year]    ADMISSION DATE:  10/04/2013 CONSULTATION DATE:  10/04/2013  REFERRING MD :  EDP  CHIEF COMPLAINT:  Dyspnea  INITIAL PRESENTATION: 63 year old female presented to [facility] ED 9/15 with SOB. SpO2 was in 60s on RA at time of admission. Improved to high 80s on 5L [place]. CXR consistent with Pulmonary edema. PCCM asked to see.   STUDIES:  9/15 BLE Doppler >>>  9/15 V/Q scan > Low probability for PE  SIGNIFICANT EVENTS: 9/15 admitted   SUBJECTIVE: Intermittent desaturations overnight into high 70s mid 80s. Placed on NRB and sats stabilized. Otherwise offers no complaints.   VITAL SIGNS: Temp:  [98 F (36.7 C)-99 F (37.2 C)] 98 F (36.7 C) (09/16 0400) Pulse Rate:  [87-111] 87 (09/16 0400) Resp:  [0-33] 0 (09/16 0400) BP: (90-120)/(62-83) 90/62 mmHg (09/16 0400) SpO2:  [90 %-100 %] 97 % (09/16 0400) Weight:  [134 kg (295 lb 6.7 oz)] 134 kg (295 lb 6.7 oz) (09/16 0400) HEMODYNAMICS:   VENTILATOR SETTINGS:   INTAKE / OUTPUT:  Intake/Output Summary (Last 24 hours) at 10/05/13 0446 Last data filed at 10/04/13 2253  Gross per 24 hour  Intake      3 ml  Output      0 ml  Net      3 ml    PHYSICAL EXAMINATION: General:  Morbidly obese female in minimal distress on 5L [place]  Neuro:  Alert, oriented, no focal deficit HEENT:  [place]/AT, PERRL Cardiovascular:  RRR, no MRG Lungs:  resps even, minimally labored, crackles throughout.  Abdomen:  Obese, soft, non-tender Musculoskeletal:  No acute deformity, pitting edema BLE  Skin:  Intact, MMM  LABS:  CBC  Recent Labs Lab 10/04/13 1649 10/05/13 0308  WBC 9.7 7.6  HGB 11.9* 10.9*  HCT 42.1 39.0  PLT 178 168   Coag's No results found for this basename: APTT, INR,  in the last 168 hours BMET  Recent Labs Lab 10/04/13 1649 10/05/13 0308  NA 139 137  K 4.8 4.8  CL 98 98  CO2 27 30  BUN 46* 45*  CREATININE 1.32* 1.38*  GLUCOSE 115* 125*    Electrolytes  Recent Labs Lab 10/04/13 1649 10/05/13 0308  CALCIUM 9.8 9.3   Sepsis Markers No results found for this basename: LATICACIDVEN, PROCALCITON, O2SATVEN,  in the last 168 hours ABG No results found for this basename: PHART, PCO2ART, PO2ART,  in the last 168 hours Liver Enzymes  Recent Labs Lab 10/04/13 1649  AST 17  ALT 9  ALKPHOS 60  BILITOT 0.8  ALBUMIN 3.0*   Cardiac Enzymes  Recent Labs Lab 10/04/13 1649  PROBNP 2649.0*   Glucose  Recent Labs Lab 10/04/13 2041  GLUCAP 120*    Imaging Nm Pulmonary Perf And Vent  10/04/2013   CLINICAL DATA:  Shortness of breath for 2 months.  EXAM: NUCLEAR MEDICINE VENTILATION - PERFUSION LUNG SCAN  TECHNIQUE: Ventilation images were obtained in multiple projections using inhaled aerosol technetium 99 M DTPA. Perfusion images were obtained in multiple projections after intravenous injection of Tc-47m MAA.  RADIOPHARMACEUTICALS:  40 mCi Tc-83m DTPA aerosol and 6 mCi Tc-42m MAA  COMPARISON:  Chest radiograph performed earlier today at 5:24 p.m.  FINDINGS: Ventilation: No significant focal ventilation defect is identified, aside from the shadow of the patient's mildly enlarged heart. Increased accumulation of activity at the left perihilar region is likely artifactual in nature.  Perfusion: No wedge shaped peripheral perfusion defects to suggest acute pulmonary embolism.  IMPRESSION: Low probability for pulmonary embolus.   Electronically Signed   By: Garald Balding M.D.   On: 10/04/2013 22:47   Dg Chest Portable 1 View  10/04/2013   CLINICAL DATA:  Shortness of breath. Cough and congestion. COPD. Diabetes. Tobacco use.  EXAM: PORTABLE CHEST - 1 VIEW  COMPARISON:  01/20/2008  FINDINGS: Moderately enlarged cardiopericardial silhouette with diffuse interstitial opacity and mild right perihilar airspace opacity.  Low lung volumes.  IMPRESSION: 1. Moderate cardiomegaly with interstitial opacity and mild right perihilar airspace  opacity compatible with acute pulmonary edema.   Electronically Signed   By: Sherryl Barters M.D.   On: 10/04/2013 17:43     ASSESSMENT / PLAN:  Acute hypoxic respiratory failure Pulmonary Edema ? PAH  Reported on OSH echo Doubt PE  >>> V/Q with low probability 9/15 Likely OSA/OHS - Supplemental O2 as needed to maintain SpO2 greater than 88% - Aggressive diuresis as tolerated - per primary - Follow CXR - 2d Echo pending - If no obvious CHF will need PAH workup - Follow up dopplers - Would benefit from pulmonary follow up as outpatient with sleep study  AKI  - Management per primary team  Georgann Housekeeper, ACNP [place] [facility] Pager [phone] or [phone]  Patient seen and examined, agree with above note.  I dictated the care and orders written for this patient under my direction.  Rush Farmer, MD [phone]

## 2013-10-06 DIAGNOSIS — E1165 Type 2 diabetes mellitus with hyperglycemia: Secondary | ICD-10-CM

## 2013-10-06 DIAGNOSIS — E118 Type 2 diabetes mellitus with unspecified complications: Secondary | ICD-10-CM

## 2013-10-06 DIAGNOSIS — M7989 Other specified soft tissue disorders: Secondary | ICD-10-CM

## 2013-10-06 DIAGNOSIS — N189 Chronic kidney disease, unspecified: Secondary | ICD-10-CM

## 2013-10-06 DIAGNOSIS — IMO0002 Reserved for concepts with insufficient information to code with codable children: Secondary | ICD-10-CM

## 2013-10-06 DIAGNOSIS — G4733 Obstructive sleep apnea (adult) (pediatric): Secondary | ICD-10-CM

## 2013-10-06 DIAGNOSIS — R0602 Shortness of breath: Secondary | ICD-10-CM

## 2013-10-06 DIAGNOSIS — I5033 Acute on chronic diastolic (congestive) heart failure: Secondary | ICD-10-CM | POA: Diagnosis not present

## 2013-10-06 LAB — IRON AND TIBC
Iron: 25 ug/dL — ABNORMAL LOW (ref 42–135)
Saturation Ratios: 6 % — ABNORMAL LOW (ref 20–55)
TIBC: 424 ug/dL (ref 250–470)
UIBC: 399 ug/dL (ref 125–400)

## 2013-10-06 LAB — C-REACTIVE PROTEIN: CRP: 1.3 mg/dL — ABNORMAL HIGH (ref <0.60)

## 2013-10-06 LAB — FERRITIN: FERRITIN: 15 ng/mL (ref 10–291)

## 2013-10-06 LAB — GLUCOSE, CAPILLARY
GLUCOSE-CAPILLARY: 118 mg/dL — AB (ref 70–99)
GLUCOSE-CAPILLARY: 172 mg/dL — AB (ref 70–99)
GLUCOSE-CAPILLARY: 92 mg/dL (ref 70–99)
Glucose-Capillary: 119 mg/dL — ABNORMAL HIGH (ref 70–99)

## 2013-10-06 LAB — CBC
HCT: 39 % (ref 36.0–46.0)
Hemoglobin: 10.7 g/dL — ABNORMAL LOW (ref 12.0–15.0)
MCH: 18.8 pg — AB (ref 26.0–34.0)
MCHC: 27.4 g/dL — ABNORMAL LOW (ref 30.0–36.0)
MCV: 68.4 fL — ABNORMAL LOW (ref 78.0–100.0)
Platelets: 179 10*3/uL (ref 150–400)
RBC: 5.7 MIL/uL — ABNORMAL HIGH (ref 3.87–5.11)
RDW: 23.1 % — ABNORMAL HIGH (ref 11.5–15.5)
WBC: 7.3 10*3/uL (ref 4.0–10.5)

## 2013-10-06 LAB — VITAMIN B12: Vitamin B-12: 1454 pg/mL — ABNORMAL HIGH (ref 211–911)

## 2013-10-06 LAB — BASIC METABOLIC PANEL
Anion gap: 12 (ref 5–15)
BUN: 46 mg/dL — ABNORMAL HIGH (ref 6–23)
CALCIUM: 9.3 mg/dL (ref 8.4–10.5)
CO2: 29 mEq/L (ref 19–32)
Chloride: 97 mEq/L (ref 96–112)
Creatinine, Ser: 1.44 mg/dL — ABNORMAL HIGH (ref 0.50–1.10)
GFR, EST AFRICAN AMERICAN: 44 mL/min — AB (ref >90)
GFR, EST NON AFRICAN AMERICAN: 38 mL/min — AB (ref >90)
Glucose, Bld: 132 mg/dL — ABNORMAL HIGH (ref 70–99)
Potassium: 5.3 mEq/L (ref 3.7–5.3)
Sodium: 138 mEq/L (ref 137–147)

## 2013-10-06 LAB — RETICULOCYTES
RBC.: 5.7 MIL/uL — ABNORMAL HIGH (ref 3.87–5.11)
RETIC COUNT ABSOLUTE: 131.1 10*3/uL (ref 19.0–186.0)
Retic Ct Pct: 2.3 % (ref 0.4–3.1)

## 2013-10-06 LAB — FOLATE: Folate: >20 ng/mL

## 2013-10-06 LAB — SEDIMENTATION RATE: Sed Rate: 8 mm/hr (ref 0–22)

## 2013-10-06 LAB — TSH: TSH: 3.49 u[IU]/mL (ref 0.350–4.500)

## 2013-10-06 LAB — RHEUMATOID FACTOR

## 2013-10-06 MED ORDER — DOBUTAMINE IN D5W 4-5 MG/ML-% IV SOLN
2.5000 ug/kg/min | INTRAVENOUS | Status: DC
  Administered 2013-10-06 – 2013-10-08 (×2): 2.5 ug/kg/min via INTRAVENOUS
  Filled 2013-10-06 (×2): qty 250

## 2013-10-06 NOTE — Consult Note (Signed)
CARDIOLOGY CONSULT NOTE  Patient ID: HOA SHARAR MRN: [id number] DOB/AGE: 1950-04-27 63 y.o.  Admit date: 10/04/2013 Referring Physician  J. Thereasa Solo, MD Primary Physician:  Maximino Greenland, MD Reason for Consultation  CHF  HPI: Patient is a 63 year old African American female with history of hypertension, hyperlipidemia, diabetes mellitus and history of tobacco use disorder who is admitted to the hospital with hypoxemia and shortness of breath and leg edema. She was found to be in daily consider congestive heart failure and was admitted for further evaluation and treatment. Because of hypoxemia, VQ scan that was performed revealed low risk for pulmonary embolism. She was started on diuretic therapy, I was consult up on to see if there is any additional cardiac recommendations. Next  Patient states that since admission to the hospital her breathing is better and her orthopnea is improving. She still states that has leg edema but thinks that this is improving also. She denies any chest pain, palpitation, dizziness or syncope. She denies any symptoms of TIA or claudication.   Past Medical History  Diagnosis Date  . Obesity   . Chest pain   . Hypertension   . Hypercholesteremia   . Diabetes mellitus without complication   . Gout      History reviewed. No pertinent past surgical history.   Family history: There is no family history of premature coronary artery disease.  Social History: History   Social History  . Marital Status: Divorced    Spouse Name: N/A    Number of Children: N/A  . Years of Education: N/A   Occupational History  . Not on file.   Social History Main Topics  . Smoking status: Current Every Day Smoker  . Smokeless tobacco: Not on file  . Alcohol Use: Yes  . Drug Use: No  . Sexual Activity: Not on file   Other Topics Concern  . Not on file   Social History Narrative  . No narrative on file     Prescriptions prior to admission  Medication Sig  Dispense Refill  . acetaminophen (TYLENOL) 650 MG CR tablet Take 1,300 mg by mouth every 8 (eight) hours as needed for pain.      Marland Kitchen amLODipine (NORVASC) 10 MG tablet Take 10 mg by mouth daily.      Marland Kitchen aspirin EC 81 MG tablet Take 81 mg by mouth daily.      . Cholecalciferol (VITAMIN D) 2000 UNITS CAPS Take 1 capsule by mouth daily.      . Flaxseed, Linseed, (FLAXSEED OIL MAX STR) 1300 MG CAPS Take 1 capsule by mouth daily.      . furosemide (LASIX) 40 MG tablet Take 40 mg by mouth daily.      . Liraglutide (VICTOZA) 18 MG/3ML SOPN Inject 1.8 mLs into the skin daily.      . metFORMIN (GLUMETZA) 500 MG (MOD) 24 hr tablet Take 500-1,000 mg by mouth 2 (two) times daily. Take 1 tablet in AM and 2 tablet in PM      . Multiple Vitamin (MULTIVITAMIN WITH MINERALS) TABS tablet Take 1 tablet by mouth daily.      . nitroGLYCERIN (NITROSTAT) 0.4 MG SL tablet Place 0.4 mg under the tongue every 5 (five) minutes as needed for chest pain.      Marland Kitchen olmesartan (BENICAR) 40 MG tablet Take 40 mg by mouth daily.      . ranitidine (ZANTAC) 150 MG tablet Take 150 mg by mouth daily as needed for heartburn.      Marland Kitchen  simvastatin (ZOCOR) 20 MG tablet Take 20 mg by mouth daily.      . STUDY MEDICATION Take 2 capsules by mouth daily. Take 1 capsule from each bottle [id number] and [id number]) every morning. [employer], Protocol No. [id number]. Study of Febuxostat, Allopurinol, or Placebo.        Scheduled Meds: . aspirin EC  81 mg Oral Daily  . cholecalciferol  2,000 Units Oral Daily  . furosemide  80 mg Intravenous Q12H  . heparin  5,000 Units Subcutaneous 3 times per day  . insulin aspart  0-15 Units Subcutaneous TID WC  . insulin aspart  0-5 Units Subcutaneous QHS  . Liraglutide  1.8 mL Subcutaneous Daily  . multivitamin with minerals  1 tablet Oral Daily  . nicotine  7 mg Transdermal Daily  . simvastatin  20 mg Oral Daily  . sodium chloride  3 mL Intravenous Q12H   Continuous Infusions:  PRN Meds:.acetaminophen,  famotidine, nitroGLYCERIN  ROS: General: no fevers/chills/night sweats Eyes: no blurry vision, diplopia, or amaurosis ENT: no sore throat or hearing loss GI: no abdominal pain, nausea, vomiting, diarrhea, or constipation. Denies melina or hematochezia GU: no dysuria, frequency, or hematuria Skin: no rash Neuro: no headache, numbness, tingling, or weakness of extremities Heme: no bleeding, DVT, or easy bruising Endo: no polydipsia or polyuria    Physical Exam: Blood pressure 95/61, pulse 92, temperature 98.5 F (36.9 C), temperature source Oral, resp. rate 19, height 5\' 3"  (1.6 m), weight 134 kg (295 lb 6.7 oz), SpO2 96.00%.   General appearance: alert, cooperative, appears older than stated age, no distress and moderately obese JVD elevated. Short neck. Lungs: Scattered crackles bilateral Heart: regular rate and rhythm, S1, S2 normal, no murmur, click, rub or gallop Abdomen: Large pannus present, hepatojugular reflex present. Diffuse mild tenderness present, bowel sounds heard in all 4 quadrants. Extremities: 2+ bilateral below-knee pitting edema present. No tenderness. Pulses: There is no carotid bruit, femoral pulses difficult to feel, popliteal pulse faint, pedal pulse, dorsalis pedis 1-2+, posterior tibial faint, difficult to feel due to edema. Neurologic: Grossly normal  Labs:   Lab Results  Component Value Date   WBC 7.3 10/06/2013   HGB 10.7* 10/06/2013   HCT 39.0 10/06/2013   MCV 68.4* 10/06/2013   PLT 179 10/06/2013    Recent Labs Lab 10/04/13 1649  10/06/13 0318  NA 139  < > 138  K 4.8  < > 5.3  CL 98  < > 97  CO2 27  < > 29  BUN 46*  < > 46*  CREATININE 1.32*  < > 1.44*  CALCIUM 9.8  < > 9.3  PROT 6.8  --   --   BILITOT 0.8  --   --   ALKPHOS 60  --   --   ALT 9  --   --   AST 17  --   --   GLUCOSE 115*  < > 132*  < > = values in this interval not displayed. Lab Results  Component Value Date   TROPONINI <0.30 10/05/2013    Lipid Panel  No results found  for this basename: chol, trig, hdl, cholhdl, vldl, ldlcalc    EKG: 10/04/2013: Sinus tachycardia at the rate of 106 beats per minute, P-Pulmonale, rightward axis, incomplete rectal branch block, low-voltage complexes, pulmonary disease pattern.  Outpatient echocardiogram 09/22/2013: Normal left ventricle systolic function, ejection fraction 55-60%, flattened septum suggests right ventricular volume overload. Left atrium mildly dilated, right atrium moderately dilated. Right ventricle  moderately dilated with preserved RV systolic function. Moderate tricuspid regurgitation, moderate to severe pulmonary hypertension, PA pressure measuring 60 mm mercury. IVC dilated with poor inspiratory collapse suggests elevated right heart pressure.  Outpatient Lexiscan Myoview stress test 8/21/015: Normal perfusion, mild breast attenuation artifact in the anterior wall, ejection fraction 55-60%, low risk study.   Radiology: Nm Pulmonary Perf And Vent  10/04/2013   CLINICAL DATA:  Shortness of breath for 2 months.  EXAM: NUCLEAR MEDICINE VENTILATION - PERFUSION LUNG SCAN  TECHNIQUE: Ventilation images were obtained in multiple projections using inhaled aerosol technetium 99 M DTPA. Perfusion images were obtained in multiple projections after intravenous injection of Tc-83m MAA.  RADIOPHARMACEUTICALS:  40 mCi Tc-79m DTPA aerosol and 6 mCi Tc-20m MAA  COMPARISON:  Chest radiograph performed earlier today at 5:24 p.m.  FINDINGS: Ventilation: No significant focal ventilation defect is identified, aside from the shadow of the patient's mildly enlarged heart. Increased accumulation of activity at the left perihilar region is likely artifactual in nature.  Perfusion: No wedge shaped peripheral perfusion defects to suggest acute pulmonary embolism.  IMPRESSION: Low probability for pulmonary embolus.   Electronically Signed   By: Garald Balding M.D.   On: 10/04/2013 22:47   ASSESSMENT AND PLAN:  1. Acute on chronic cor pulmonale  with evidence of right ventricular heart failure. 2. Acute on chronic diastolic heart failure 3. Moderate to severe pulmonary hypertension, probably secondary etiology that includes COPD, ongoing tobacco use, morbid obesity and chronic diastolic left ventricular heart failure. 4. Chronic renal insufficiency, stage III chronic kidney disease 5. Diabetes mellitus with renal complication 6. Tobacco use disorder 7. Morbid obesity with obesity hypoventilation. 8. Microcytic anemia, iron studies suggest iron patient's anemia. Given her COPD status, hemoglobin of 10.0 g suggests at least moderate if not severe anemia. 9. Diabetes mellitus type 2 uncontrolled, HbA1c 10/05/2013    7.7%. 10. History of gout no recurrence of the past one year, patient is presently on experimental medication on a research study drug.  Recommendation: I suspect that her renal function will deteriorate with diuresis. She is leaning like right ventricular systolic and diastolic heart failure with acute on chronic cor pulmonale, would recommend addition of dobutamine for the next 24 hours. Blood pressure is soft, unable to use beta blockers, ACE inhibitors should be avoided at this time due to renal insult and possibility of precipitating acute renal failure.  Her pulmonary hypertension is probably secondary pulmonary hypertension, I do not think she needs urgent right heart catheterization. Changing or modifying her cardiovascular risk factors and obesity and probably treating her sleep apnea may also help with reducing the PA pressure. I will continue to follow the patient with you.  Laverda Page, MD 10/06/2013, 6:10 PM [facility] [place] Pager: [phone] Office: [phone] If no answer Cell [phone]

## 2013-10-06 NOTE — H&P (Signed)
PULMONARY / CRITICAL CARE MEDICINE   Name: Anna Clay MRN: [id number] DOB: 05-20-1950    ADMISSION DATE:  10/04/2013 CONSULTATION DATE:  10/04/2013  REFERRING MD :  EDP  CHIEF COMPLAINT:  Dyspnea  INITIAL PRESENTATION: 63 year old female presented to [facility] ED 9/15 with SOB. SpO2 was in 60s on RA at time of admission. Improved to high 80s on 5L [place]. CXR consistent with Pulmonary edema. PCCM asked to see.   STUDIES:  9/15 BLE Doppler >>> prelim neg. 9/15 VQ Scan >>> low prob for PE.  SIGNIFICANT EVENTS: 9/15 admitted    SUBJECTIVE: Breathing much improved, tolerated CPAP well overnight.  Now on NRB, SpO2 98%.  Have put in order to accept SpO2 > 85% for now.  VITAL SIGNS: Temp:  [98 F (36.7 C)-99 F (37.2 C)] 98 F (36.7 C) (09/17 0728) Pulse Rate:  [87-98] 92 (09/17 0728) Resp:  [14-25] 19 (09/17 0728) BP: (85-114)/(46-61) 95/61 mmHg (09/17 0728) SpO2:  [90 %-96 %] 96 % (09/17 0728) HEMODYNAMICS:   VENTILATOR SETTINGS:   INTAKE / OUTPUT:  Intake/Output Summary (Last 24 hours) at 10/06/13 1124 Last data filed at 10/06/13 1000  Gross per 24 hour  Intake   1246 ml  Output   3200 ml  Net  -1954 ml    PHYSICAL EXAMINATION: General:  Morbidly obese female, in NAD, sitting up in recliner.  Neuro:  A&O x 3, MAE's. HEENT:  [place]/AT, PERRL Cardiovascular:  RRR, no MRG Lungs:  Resps even, minimally labored, mild scattered crackles. Abdomen:  Obese, soft, non-tender Musculoskeletal:  No acute deformity, pitting edema BLE  Skin:  Intact, MMM  LABS:  CBC  Recent Labs Lab 10/04/13 1649 10/05/13 0308 10/06/13 0318  WBC 9.7 7.6 7.3  HGB 11.9* 10.9* 10.7*  HCT 42.1 39.0 39.0  PLT 178 168 179   Coag's No results found for this basename: APTT, INR,  in the last 168 hours BMET  Recent Labs Lab 10/04/13 1649 10/05/13 0308 10/06/13 0318  NA 139 137 138  K 4.8 4.8 5.3  CL 98 98 97  CO2 27 30 29   BUN 46* 45* 46*  CREATININE 1.32* 1.38* 1.44*  GLUCOSE 115* 125*  132*   Electrolytes  Recent Labs Lab 10/04/13 1649 10/05/13 0308 10/06/13 0318  CALCIUM 9.8 9.3 9.3   Sepsis Markers No results found for this basename: LATICACIDVEN, PROCALCITON, O2SATVEN,  in the last 168 hours ABG No results found for this basename: PHART, PCO2ART, PO2ART,  in the last 168 hours Liver Enzymes  Recent Labs Lab 10/04/13 1649  AST 17  ALT 9  ALKPHOS 60  BILITOT 0.8  ALBUMIN 3.0*   Cardiac Enzymes  Recent Labs Lab 10/04/13 1649 10/05/13 0850 10/05/13 1352 10/05/13 1914  TROPONINI  --  <0.30 <0.30 <0.30  PROBNP 2649.0*  --   --   --    Glucose  Recent Labs Lab 10/04/13 2041 10/05/13 0734 10/05/13 1233 10/05/13 1618 10/05/13 2240  GLUCAP 120* 122* 202* 156* 142*    Imaging No results found.   ASSESSMENT / PLAN:  Acute hypoxic respiratory failure Pulmonary Edema Probable PAH OSA / probable OHS - apparently formally diagnosed years ago but has never had follow up sleep study postop surgical intervention. Highly doubt PE - VQ with low probability Supplemental O2 as needed to maintain SpO2 greater than 85% Continue noctural CPAP. Aggressive diuresis as tolerated. Echo. CXR intermittently. Outpatient sleep study.  AKI - improving. Management per primary team.  Montey Hora, PA -  C  Pulmonary & Critical Care Medicine Pgr: [phone]  or [phone]  I suspect patient has been chronically hypoxic as even with sats in the 70's on RA she feels well.  Echo pending.  Will send auto-immune work up today and continue to follow.  Patient seen and examined, agree with above note.  I dictated the care and orders written for this patient under my direction.  Rush Farmer, MD [phone]

## 2013-10-06 NOTE — Clinical Documentation Improvement (Signed)
10/05/13 Progr note.Marland KitchenMarland Kitchen"She continued to have issues with progressive shortness of breath and peripheral edema and reported a 30 pound weight gain over the past several months.".Marland Kitchen."height 5\' 3"  (1.6 m), weight 295 lb 6.7 oz (134 kg)"... Per [facility] Flowsheet: 10/04/13 @ 2100> Calculated BMI 52.4 on adm 10/06/16: Daily weights, strict I&O, conts diurectics  For accurate Dx specificity & severity can noted clinical findings be further specified/ linked to condition being eval'd, mon'd and tx'd. Thank you  Possible Clinical conditions Morbid Obesity W/ BMI Underweight w/BMI Other condition Cannot clinically determine   Supporting  Information: See above note   Thank You, Ermelinda Das, RN, BSN, CCDS Certified Clinical Documentation Specialist Pager: [phone] [place]: Health Information Management

## 2013-10-06 NOTE — Clinical Documentation Improvement (Addendum)
10/06/13 Progr note.Marland KitchenMarland Kitchen"B) Acute right-sided CHF (congestive heart failure) / Pulmonary HTN  -Continue supportive care/wean oxygen as tolerated-appreciate pulmonary medicine assistance-increase Lasix dosage and monitor urinary output closely-continue Norvasc for afterload reduction-has significant RV dilatation so monitor for dizziness or presyncopal symptoms." H&P..."09/15/[id number] PROBNP 2649.0*"...  For accurate Dx specificity & severity can noted "Acute right-sided CHF (congestive heart failure)" be further specificed with type. Thank you   Possible Conditions: . Document type --Diastolic --Systolic --Combined systolic and diastolic --Other (specify) --Unable to Determine:RHF is neither systolic or diastolic-it is due to right vetricular dilatation due to severe pulmonary HTN in this case (A.Afnan Emberton, ANP) would need [place] cath to clarify  Supporting Information: See above note  Thank You,  Ermelinda Das, RN, BSN, CCDS Certified Clinical Documentation Specialist Pager: [place]: Health Information Management

## 2013-10-06 NOTE — Progress Notes (Signed)
*  PRELIMINARY RESULTS* Vascular Ultrasound Lower extremity venous duplex has been completed.  Preliminary findings: no evidence of DVT.  Landry Mellow, RDMS, RVT  10/06/2013, 10:02 AM

## 2013-10-06 NOTE — Progress Notes (Signed)
[name]  Anna Clay [id number] DOB: 01/28/1950 DOA: 10/04/2013 PCP: Maximino Greenland, MD  Brief narrative: 63 year old BF PMHx diabetes, hypertension, dyslipidemia and obstructive sleep apnea previously treated with tonsillectomy/adenoidectomy and uvuloplasty. Unfortunately patient did not followup and have an additional sleep study done after that procedure to determine if sleep apnea physiology had improved. She had been having chest discomfort over several months and underwent a stress test with her cardiologist Dr. Einar Gip which was negative. He also performed a 2-D echocardiogram which revealed moderate to severe pulmonary hypertension of 60 mmHg with associated moderately dilated RV. She continued to have issues with progressive shortness of breath and peripheral edema and reported a 30 pound weight gain over the past several months. She denied further chest pain. During evaluation at her primary care physician's office she was noted to be quite hypoxemic - O2 sats between 60 and 70% range. She was sent to the emergency department for further evaluation.  Upon presentation to the ER she was hypoxemic and despite application of 5 L nasal cannula oxygen she was still satting in the mid 80s. EKG was nonischemic in appearance. Chest x-ray was consistent with moderate cardiomegaly with interstitial opacities and a mild right perihilar airspace opacity compatible with acute pulmonary edema. There were also concerns for possible PE so VQ scan was ordered but demonstrated low probability for PE.  After admission she was evaluated by pulmonary medicine who recommended continuing current therapy of diuresis noting at presentation patient does have mild acute kidney injury. A complete pulmonary hypertension workup was initiated including an autoimmune serology workup. Sleep study was recommended but will need to be completed in the outpatient setting. Cardiology was  consulted regarding the RHF for possibility of pursuing RHC to clarify pulmonary HTN. Because of relative hypotension/borderline hyperkalermia have dc'd Norvasc and ARB to allow for maximum diuresis  HPI/Subjective: Up in chair- less SOB-legs tender from edema  Assessment/Plan:    Acute respiratory failure with hypoxia due to:   A) Pulmonary hypertension   B) Acute right-sided CHF (congestive heart failure) / Pulmonary HTN -Continue supportive care/wean oxygen as tolerated -cont 80 mg Lasix BID IV BID - (-5.9) liters off thus far-initially continued Norvasc for afterload reduction but with low BP have dc'd to allow for maximum diuresis (9/17)-has significant RV dilatation so monitor for dizziness or presyncopal symptoms  -consult Cards right heart cath? A right heart cath would allow [place] to place patient into a pulmonary hypertension group (group 1-5), which could potentially change the therapy.       Acute on chronic kidney injury -discontinued metformin 9/15-follow renal function closely with diuresis-now with borderline hyperkalemia- ARB dc'd 9/17 due to relative hypotension    Diabetes mellitus -Metformin discontinued as noted above -CBGs controlled on sliding scale insulin-Hemoglobin A1c 7.7 -given renal disease DO NOT RESUME Metformin and instead begin Lantus or Levemir this admit    OSA (obstructive sleep apnea) -Formally diagnosed several years ago but treated with airway surgery but never followed up to determine if sleep apnea physiology had improved postoperatively -will need formal sleep study as an Financial planner aware-cont q HS CPAP-suspect inadequately treated sleep apnea primary etiology to patient's pulmonary hypertension and right-sided heart failure    Anemia -TSH normal- anemia panel pending-consider check stools for blood but likely can be accomplished in the outpatient setting   DVT prophylaxis: Subcutaneous heparin Code Status: Full Family Communication:  No family at bedside Disposition Plan/Expected LOS:  Remain in step down  Consultants: Pulmonary medicine Cardiology  Procedures: Bilateral lower extremity duplex pending  Antibiotics: None  Objective: Blood pressure 95/61, pulse 92, temperature 98 F (36.7 C), temperature source Axillary, resp. rate 19, height 5\' 3"  (1.6 m), weight 295 lb 6.7 oz (134 kg), SpO2 96.00%.  Intake/Output Summary (Last 24 hours) at 10/06/13 1059 Last data filed at 10/06/13 1000  Gross per 24 hour  Intake    646 ml  Output   3200 ml  Net  -2554 ml   Exam: Gen: Mild respiratory distress as evidenced by dyspnea on exertion Chest: Lung sounds very difficult to auscultate due to large body habitus, scattered diffuse crackles primarily in bases Cardiac: Regular rate and rhythm, S1-S2, no rubs murmurs or gallops, tight 2+ lower extremity peripheral edema Abdomen: Soft, obese nontender without obvious hepatosplenomegaly, no ascites Extremities: Symmetrical in appearance without cyanosis, clubbing or effusion  Scheduled Meds:  Scheduled Meds: . aspirin EC  81 mg Oral Daily  . cholecalciferol  2,000 Units Oral Daily  . furosemide  80 mg Intravenous Q12H  . heparin  5,000 Units Subcutaneous 3 times per day  . insulin aspart  0-15 Units Subcutaneous TID WC  . insulin aspart  0-5 Units Subcutaneous QHS  . Liraglutide  1.8 mL Subcutaneous Daily  . multivitamin with minerals  1 tablet Oral Daily  . nicotine  7 mg Transdermal Daily  . simvastatin  20 mg Oral Daily  . sodium chloride  3 mL Intravenous Q12H   Data Reviewed: Basic Metabolic Panel:  Recent Labs Lab 10/04/13 1649 10/05/13 0308 10/06/13 0318  NA 139 137 138  K 4.8 4.8 5.3  CL 98 98 97  CO2 27 30 29   GLUCOSE 115* 125* 132*  BUN 46* 45* 46*  CREATININE 1.32* 1.38* 1.44*  CALCIUM 9.8 9.3 9.3   Liver Function Tests:  Recent Labs Lab 10/04/13 1649  AST 17  ALT 9  ALKPHOS 60  BILITOT 0.8  PROT 6.8  ALBUMIN 3.0*    CBC:  Recent Labs Lab 10/04/13 1649 10/05/13 0308 10/06/13 0318  WBC 9.7 7.6 7.3  NEUTROABS 6.7  --   --   HGB 11.9* 10.9* 10.7*  HCT 42.1 39.0 39.0  MCV 69.5* 69.0* 68.4*  PLT 178 168 179   Cardiac Enzymes:  Recent Labs Lab 10/05/13 0850 10/05/13 1352 10/05/13 1914  TROPONINI <0.30 <0.30 <0.30   CBG:  Recent Labs Lab 10/04/13 2041 10/05/13 0734 10/05/13 1233 10/05/13 1618 10/05/13 2240  GLUCAP 120* 122* 202* 156* 142*    Recent Results (from the past 240 hour(s))  MRSA PCR SCREENING     Status: None   Collection Time    10/04/13  9:12 PM      Result Value Ref Range Status   MRSA by PCR NEGATIVE  NEGATIVE Final   Comment:            The GeneXpert MRSA Assay (FDA     approved for NASAL specimens     only), is one component of a     comprehensive MRSA colonization     surveillance program. It is not     intended to diagnose MRSA     infection nor to guide or     monitor treatment for     MRSA infections.     Studies:  Recent x-ray studies have been reviewed in detail by the Attending Physician  Time spent :  35 mins      Erin Hearing,  ANP [facility] Office  [phone] Pager [phone]  On-Call/Text Page:      [email or web address]      password [email or web address]  If 7PM-7AM, please contact night-coverage [email or web address] Password [email or web address] 10/06/2013, 10:59 AM   LOS: 2 days   Examined patient and discussed assessment and plan with ANP Ebony Hail, agree with above plan Patient with multiple complex medical problems> 30 minutes spent in direct patient care

## 2013-10-06 NOTE — ED Provider Notes (Signed)
63 y.o. Female presented from pcp office with sob worsening over months.  She reportedly had a sat in the 60s at triage.  Here she is on a nrb  PE Obese female with increased wob Tachypnea and tachycardia Lungs- diffuse rhonchi, no wheezing noted  I saw and evaluated the patient, reviewed the resident's note and I agree with the findings and plan.   EKG Interpretation   Date/Time:  Tuesday October 04 2013 16:48:48 EDT Ventricular Rate:  104 PR Interval:  156 QRS Duration: 60 QT Interval:  304 QTC Calculation: 399 R Axis:   114 Text Interpretation:  Sinus tachycardia Right atrial enlargement Right  axis deviation Low voltage QRS Nonspecific ST and T wave abnormality  Abnormal ECG Confirmed by Dawnya Grams MD, Andee Poles [phone]) on 10/04/2013 5:12:22 PM       Shaune Pollack, MD 10/06/13 [id number]

## 2013-10-07 LAB — GLUCOSE, CAPILLARY
GLUCOSE-CAPILLARY: 100 mg/dL — AB (ref 70–99)
GLUCOSE-CAPILLARY: 125 mg/dL — AB (ref 70–99)
GLUCOSE-CAPILLARY: 113 mg/dL — AB (ref 70–99)
Glucose-Capillary: 210 mg/dL — ABNORMAL HIGH (ref 70–99)

## 2013-10-07 LAB — CBC
HCT: 38.7 % (ref 36.0–46.0)
Hemoglobin: 10.9 g/dL — ABNORMAL LOW (ref 12.0–15.0)
MCH: 19.5 pg — ABNORMAL LOW (ref 26.0–34.0)
MCHC: 28.2 g/dL — ABNORMAL LOW (ref 30.0–36.0)
MCV: 69.4 fL — AB (ref 78.0–100.0)
Platelets: 162 10*3/uL (ref 150–400)
RBC: 5.58 MIL/uL — ABNORMAL HIGH (ref 3.87–5.11)
RDW: 23.2 % — ABNORMAL HIGH (ref 11.5–15.5)
WBC: 9 10*3/uL (ref 4.0–10.5)

## 2013-10-07 LAB — BASIC METABOLIC PANEL
Anion gap: 10 (ref 5–15)
BUN: 40 mg/dL — ABNORMAL HIGH (ref 6–23)
CHLORIDE: 93 meq/L — AB (ref 96–112)
CO2: 35 meq/L — AB (ref 19–32)
CREATININE: 1.3 mg/dL — AB (ref 0.50–1.10)
Calcium: 9.8 mg/dL (ref 8.4–10.5)
GFR calc Af Amer: 50 mL/min — ABNORMAL LOW (ref >90)
GFR calc non Af Amer: 43 mL/min — ABNORMAL LOW (ref >90)
Glucose, Bld: 83 mg/dL (ref 70–99)
Potassium: 4.4 mEq/L (ref 3.7–5.3)
Sodium: 138 mEq/L (ref 137–147)

## 2013-10-07 LAB — ANA: Anti Nuclear Antibody(ANA): NEGATIVE

## 2013-10-07 LAB — PRO B NATRIURETIC PEPTIDE: Pro B Natriuretic peptide (BNP): 1411 pg/mL — ABNORMAL HIGH (ref 0–125)

## 2013-10-07 LAB — ANTI-DNA ANTIBODY, DOUBLE-STRANDED: DS DNA AB: 1 [IU]/mL

## 2013-10-07 LAB — ANGIOTENSIN CONVERTING ENZYME: ANGIOTENSIN-CONVERTING ENZYME: 37 U/L (ref 8–52)

## 2013-10-07 MED ORDER — HYDRALAZINE HCL 25 MG PO TABS
25.0000 mg | ORAL_TABLET | Freq: Three times a day (TID) | ORAL | Status: DC
  Administered 2013-10-07 – 2013-10-12 (×10): 25 mg via ORAL
  Filled 2013-10-07 (×19): qty 1

## 2013-10-07 NOTE — Progress Notes (Addendum)
Subjective:  Patient feels much improved with regard to shortness of breath.  She also states that her legs are not hurting as bad and feels that the leg swelling has come down.  No chest pain or palpitation.  Objective:  Vital Signs in the last 24 hours: Temp:  [97.7 F (36.5 C)-99.3 F (37.4 C)] 99.3 F (37.4 C) (09/18 0744) Pulse Rate:  [96-226] 99 (09/18 0900) Resp:  [8-24] 21 (09/18 0900) BP: (92-116)/(33-66) 106/54 mmHg (09/18 0900) SpO2:  [66 %-100 %] 89 % (09/18 0900) FiO2 (%):  [50 %] 50 % (09/18 0400) Weight:  [127.688 kg (281 lb 8 oz)] 127.688 kg (281 lb 8 oz) (09/18 0744)  Intake/Output from previous day: 09/17 0701 - 09/18 0700 In: 1014.4 [P.O.:960; I.V.:54.4] Out: 6900 [Urine:6900]  Physical Exam: General appearance: alert, cooperative, appears older than stated age, no distress and moderately obese  JVD elevated. Short neck.  Lungs:  Clear to auscultate. Heart: regular rate and rhythm, S1, S2 normal, no murmur, click, rub or gallop  Abdomen: Large pannus present, hepatojugular reflex present. Diffuse mild tenderness present, bowel sounds heard in all 4 quadrants.  Extremities: 2-3+ bilateral below-knee pitting edema present. No tenderness. Improved from yesterday Pulses: There is no carotid bruit, femoral pulses difficult to feel, popliteal pulse faint, pedal pulse, dorsalis pedis 1-2+, posterior tibial faint, difficult to feel due to edema.  Neurologic: Grossly normal  Lab Results: BMP  Recent Labs  10/05/13 0308 10/06/13 0318 10/07/13 0339  NA 137 138 138  K 4.8 5.3 4.4  CL 98 97 93*  CO2 30 29 35*  GLUCOSE 125* 132* 83  BUN 45* 46* 40*  CREATININE 1.38* 1.44* 1.30*  CALCIUM 9.3 9.3 9.8  GFRNONAA 40* 38* 43*  GFRAA 46* 44* 50*    CBC  Recent Labs Lab 10/04/13 1649  10/07/13 0339  WBC 9.7  < > 9.0  RBC 6.06*  < > 5.58*  HGB 11.9*  < > 10.9*  HCT 42.1  < > 38.7  PLT 178  < > 162  MCV 69.5*  < > 69.4*  MCH 19.6*  < > 19.5*  MCHC 28.3*  < >  28.2*  RDW 23.6*  < > 23.2*  LYMPHSABS 2.5  --   --   MONOABS 0.4  --   --   EOSABS 0.1  --   --   BASOSABS 0.0  --   --   < > = values in this interval not displayed.  HEMOGLOBIN A1C Lab Results  Component Value Date   HGBA1C 7.7* 10/05/2013   MPG 174* 10/05/2013    Cardiac Panel (last 3 results)  Recent Labs  10/05/13 0850 10/05/13 1352 10/05/13 1914  TROPONINI <0.30 <0.30 <0.30    BNP (last 3 results)  Recent Labs  10/04/13 1649 10/07/13 0339  PROBNP 2649.0* 1411.0*    TSH  Recent Labs  10/06/13 0318  TSH 3.490    CHOLESTEROL No results found for this basename: CHOL,  in the last 8760 hours  Hepatic Function Panel  Recent Labs  10/04/13 1649  PROT 6.8  ALBUMIN 3.0*  AST 17  ALT 9  ALKPHOS 60  BILITOT 0.8   EKG: 10/04/2013: Sinus tachycardia at the rate of 106 beats per minute, P-Pulmonale, rightward axis, incomplete rectal branch block, low-voltage complexes, pulmonary disease pattern.  Outpatient echocardiogram 09/22/2013: Normal left ventricle systolic function, ejection fraction 55-60%, flattened septum suggests right ventricular volume overload. Left atrium mildly dilated, right atrium moderately dilated. Right  ventricle moderately dilated with preserved RV systolic function. Moderate tricuspid regurgitation, moderate to severe pulmonary hypertension, PA pressure measuring 60 mm mercury. IVC dilated with poor inspiratory collapse suggests elevated right heart pressure.  Outpatient Lexiscan Myoview stress test 8/21/015: Normal perfusion, mild breast attenuation artifact in the anterior wall, ejection fraction 55-60%, low risk study.   Scheduled Meds: . aspirin EC  81 mg Oral Daily  . cholecalciferol  2,000 Units Oral Daily  . furosemide  80 mg Intravenous Q12H  . heparin  5,000 Units Subcutaneous 3 times per day  . insulin aspart  0-15 Units Subcutaneous TID WC  . insulin aspart  0-5 Units Subcutaneous QHS  . Liraglutide  1.8 mL Subcutaneous Daily   . multivitamin with minerals  1 tablet Oral Daily  . nicotine  7 mg Transdermal Daily  . simvastatin  20 mg Oral Daily  . sodium chloride  3 mL Intravenous Q12H   Continuous Infusions: . DOBUTamine 2.5 mcg/kg/min (10/06/13 2043)   PRN Meds:.acetaminophen, famotidine, nitroGLYCERIN   Assessment/Plan:  1. Acute on chronic cor pulmonale with evidence of right ventricular heart failure, acute on chronic right ventricular systolic and diastolic heart failure.  Pulmonary hypertension due to Pickwickian syndrome with obesity hypoventilation, morbid obesity, COPD and ongoing tobacco use disorder and dietary indiscretion.  2. Acute on chronic diastolic heart failure  3. Moderate to severe pulmonary hypertension, probably secondary etiology that includes COPD, ongoing tobacco use, morbid obesity and chronic diastolic left ventricular heart failure.  4. Acute on renal insufficiency, stage I chronic kidney disease, S. Cr improved with Dobutamine infusion 5. Diabetes mellitus with renal complication  6. Tobacco use disorder  7. Morbid obesity with obesity hypoventilation.  8. Microcytic anemia, iron studies suggest iron patient's anemia. Given her COPD status, hemoglobin of 10.0 g suggests at least moderate if not severe anemia.  9. Diabetes mellitus type 2 uncontrolled, HbA1c 10/05/2013 7.7%.  10. History of gout no recurrence of the past one year, patient is presently on experimental medication on a research study drug.  Recommendation:  Excellent response to IV dobutamine, serum creatinine nearly back to baseline, I will add spironolactone, continue dobutamine today and discontinue tomorrow. Still has significant volume overload. Once dobutamine is discontinued, once stable, we can certainly start her on a low-dose of beta blocker.  Laverda Page, M.D. 10/07/2013, 11:27 AM [facility] Pager: [phone] Office: [phone] If no answer: [phone]   Will discontinue  dubutamine. Tolerating spironolactone. Will add Metoprolol succinate 25 mg 1/2 tablet daily starting tomorrow if stable. Ambulate. Continue IV lasix for today and change to PO dose tomorrow. I will see  Her tomorrow. EKG S. Tach, P-Pulmonale.

## 2013-10-07 NOTE — Progress Notes (Signed)
[name]  Anna Clay [id number] DOB: [birth year] DOA: 10/04/2013 PCP: Maximino Greenland, MD  Brief narrative: 63 year old F hx diabetes, hypertension, dyslipidemia and obstructive sleep apnea previously treated with tonsillectomy/adenoidectomy and uvuloplasty. Unfortunately patient did not followup and have an additional sleep study done after that procedure to determine if sleep apnea physiology had improved. She had been having chest discomfort over several months and underwent a stress test with her cardiologist Dr. Einar Gip which was negative. He also performed a 2-D echocardiogram which revealed moderate to severe pulmonary hypertension of 60 mmHg with associated moderately dilated RV. She continued to have issues with progressive shortness of breath and peripheral edema and reported a 30 pound weight gain over the past several months. She denied further chest pain. During evaluation at her primary care physician's office she was noted to be quite hypoxemic - O2 sats between 60 and 70% range. She was sent to the emergency department for further evaluation.  Upon presentation to the ER she was hypoxemic and despite application of 5 L nasal cannula oxygen she was still satting in the mid 80s. EKG was nonischemic in appearance. Chest x-ray was consistent with moderate cardiomegaly with interstitial opacities and a mild right perihilar airspace opacity compatible with acute pulmonary edema. There were also concerns for possible PE so VQ scan was ordered but demonstrated low probability for PE.  After admission she was evaluated by pulmonary medicine who recommended continuing current therapy of diuresis noting at presentation patient does have mild acute kidney injury. A complete pulmonary hypertension workup was initiated including an autoimmune serology workup. Sleep study was recommended but will need to be completed in the outpatient setting. Cardiology was  consulted regarding the RHF for possibility of pursuing RHC to clarify pulmonary HTN. Because of relative hypotension/borderline hyperkalermia have dc'd Norvasc and ARB to allow for maximum diuresis  Patient's cardiologist Dr. Einar Gip was consulted to assist in the management of her heart failure. Dobutamine was initiated. Per his recommendation no indication to pursue right heart catheterization at this point.   HPI/Subjective: Up in chair- less SOB and resting better; tolerating CPAP QHS thus far  Assessment/Plan:  Acute respiratory failure with hypoxia due to:   A) Pulmonary hypertension   B) Acute right-sided CHF (congestive heart failure) / Pulmonary HTN -Continue supportive care/wean oxygen as tolerated-cont 80 mg Lasix BID IV with dobutamine as ordered by cardiology- >10 liters off thus far-initially continued Norvasc for afterload reduction but with low BP have dc'd to allow for maximum diuresis (9/17)-has significant RV dilatation so monitor for dizziness or presyncopal symptoms - no indication at this point to pursue right heart catheterization but may benefit from pulmonary artery catheter placement measure cardiac output, wedge and right heart pressures to guide medical management-se Cardiology notes for details    Acute on chronic kidney injury -discontinued metformin 9/15-follow renal function closely with diuresis-now with borderline hyperkalemia- ARB dc'd 9/17 due to relative hypotension-improving with the addition of dobutamine and continued diuresis  Diabetes mellitus -Metformin discontinued as noted above -CBGs controlled on sliding scale insulin-Hemoglobin A1c 7.7 -given renal disease DO NOT RESUME Metformin at discharge-consider begin Lantus or Levemir this admit  OSA (obstructive sleep apnea) -Formally diagnosed several years ago but treated with airway surgery but never followed up to determine if sleep apnea physiology had improved postoperatively-tolerating HS CPAP at  this point-will need formal sleep study as an Financial planner aware--suspect inadequately treated sleep apnea primary etiology to  patient's pulmonary hypertension and right-sided heart failure  Anemia -TSH normal- anemia panel with low iron and low sats in the low normal ferritin which is consistent with anemia of chronic disease-consider add iron replacement at discharge  DVT prophylaxis: Subcutaneous heparin Code Status: Full Family Communication: Spoke with son at bedside Disposition Plan/Expected LOS: Remain in step down due to need for dobutamine infusion  Consultants: PCCM Cardiology  Procedures: Bilateral lower extremity duplex negative for DVT  Antibiotics: None  Objective: Blood pressure 106/54, pulse 99, temperature 99.3 F (37.4 C), temperature source Axillary, resp. rate 21, height 5\' 3"  (1.6 m), weight 281 lb 8 oz (127.688 kg), SpO2 89.00%.  Intake/Output Summary (Last 24 hours) at 10/07/13 0945 Last data filed at 10/07/13 0900  Gross per 24 hour  Intake 1021.42 ml  Output   7400 ml  Net -6378.58 ml   Exam: Gen: Mild respiratory distress as evidenced by dyspnea on exertion Chest: Lung sounds diminished due to body habitus, scattered diffuse crackles primarily in bases Cardiac: Regular rate and rhythm, S1-S2, no rubs murmurs or gallops, legs much soft but still with 2+ lower extremity peripheral edema Abdomen: Soft, obese nontender without obvious hepatosplenomegaly, no ascites Extremities: Symmetrical in appearance without cyanosis, clubbing or effusion  Scheduled Meds:  Scheduled Meds: . aspirin EC  81 mg Oral Daily  . cholecalciferol  2,000 Units Oral Daily  . furosemide  80 mg Intravenous Q12H  . heparin  5,000 Units Subcutaneous 3 times per day  . insulin aspart  0-15 Units Subcutaneous TID WC  . insulin aspart  0-5 Units Subcutaneous QHS  . Liraglutide  1.8 mL Subcutaneous Daily  . multivitamin with minerals  1 tablet Oral Daily  . nicotine  7  mg Transdermal Daily  . simvastatin  20 mg Oral Daily  . sodium chloride  3 mL Intravenous Q12H   Data Reviewed: Basic Metabolic Panel:  Recent Labs Lab 10/04/13 1649 10/05/13 0308 10/06/13 0318 10/07/13 0339  NA 139 137 138 138  K 4.8 4.8 5.3 4.4  CL 98 98 97 93*  CO2 27 30 29  35*  GLUCOSE 115* 125* 132* 83  BUN 46* 45* 46* 40*  CREATININE 1.32* 1.38* 1.44* 1.30*  CALCIUM 9.8 9.3 9.3 9.8   Liver Function Tests:  Recent Labs Lab 10/04/13 1649  AST 17  ALT 9  ALKPHOS 60  BILITOT 0.8  PROT 6.8  ALBUMIN 3.0*   CBC:  Recent Labs Lab 10/04/13 1649 10/05/13 0308 10/06/13 0318 10/07/13 0339  WBC 9.7 7.6 7.3 9.0  NEUTROABS 6.7  --   --   --   HGB 11.9* 10.9* 10.7* 10.9*  HCT 42.1 39.0 39.0 38.7  MCV 69.5* 69.0* 68.4* 69.4*  PLT 178 168 179 162   Cardiac Enzymes:  Recent Labs Lab 10/05/13 0850 10/05/13 1352 10/05/13 1914  TROPONINI <0.30 <0.30 <0.30   CBG:  Recent Labs Lab 10/06/13 0731 10/06/13 1127 10/06/13 1724 10/06/13 2148 10/07/13 0746  GLUCAP 118* 172* 92 119* 100*    Recent Results (from the past 240 hour(s))  MRSA PCR SCREENING     Status: None   Collection Time    10/04/13  9:12 PM      Result Value Ref Range Status   MRSA by PCR NEGATIVE  NEGATIVE Final   Comment:            The GeneXpert MRSA Assay (FDA     approved for NASAL specimens     only), is one component of  a     comprehensive MRSA colonization     surveillance program. It is not     intended to diagnose MRSA     infection nor to guide or     monitor treatment for     MRSA infections.     Studies:  Recent x-ray studies have been reviewed in detail by the Attending Physician  Time spent :  [place], ANP [facility] Office  [phone] Pager [phone]  On-Call/Text Page:      [email or web address]      password [email or web address]  If 7PM-7AM, please contact night-coverage [email or web address] Password [email or web address] 10/07/2013, 9:45 AM   LOS: 3 days   I have  personally examined this patient and reviewed the entire database. I have reviewed the above note, made any necessary editorial changes, and agree with its content.  Cherene Altes, MD [facility]

## 2013-10-07 NOTE — Progress Notes (Signed)
Spoke with representative Jonni Sanger) from [facility], he wanted to bring pt CPAP machine for home. Pt not ready to dc, will inform pt. Etta Quill, RN 10/07/2013 6:09 PM

## 2013-10-07 NOTE — Progress Notes (Signed)
PULMONARY / CRITICAL CARE MEDICINE   Name: PHALA VEIGEL MRN: [id number] DOB: 02/19/1950    ADMISSION DATE:  10/04/2013 CONSULTATION DATE:  10/04/2013  REFERRING MD :  EDP  CHIEF COMPLAINT:  Dyspnea  INITIAL PRESENTATION: 63 year old female presented to [facility] ED 9/15 with SOB. SpO2 was in 60s on RA at time of admission. Improved to high 80s on 5L [place]. CXR consistent with Pulmonary edema. PCCM asked to see.   STUDIES:  9/15 BLE Doppler >>> prelim neg. 9/15 VQ Scan >>> low prob for PE.  SIGNIFICANT EVENTS: 9/15 admitted  9/16 Echo with PAP of 60 mmHg and dilation of the RV  SUBJECTIVE: Speaking in full sentences but still requiring NRB.  VITAL SIGNS: Temp:  [98 F (36.7 C)-99.3 F (37.4 C)] 98 F (36.7 C) (09/18 1136) Pulse Rate:  [96-226] 102 (09/18 1136) Resp:  [8-23] 14 (09/18 1136) BP: (89-116)/(33-66) 98/49 mmHg (09/18 1223) SpO2:  [66 %-99 %] 90 % (09/18 1136) FiO2 (%):  [50 %] 50 % (09/18 0400) Weight:  [127.688 kg (281 lb 8 oz)] 127.688 kg (281 lb 8 oz) (09/18 0744) HEMODYNAMICS:   VENTILATOR SETTINGS: Vent Mode:  [-]  FiO2 (%):  [50 %] 50 % INTAKE / OUTPUT:  Intake/Output Summary (Last 24 hours) at 10/07/13 1319 Last data filed at 10/07/13 1245  Gross per 24 hour  Intake 549.42 ml  Output   7650 ml  Net -7100.58 ml    PHYSICAL EXAMINATION: General:  Morbidly obese female, in NAD, sitting up in recliner.  Neuro:  A&O x 3, MAE's. HEENT:  [place]/AT, PERRL Cardiovascular:  RRR, no MRG Lungs:  Resps even, minimally labored, mild scattered crackles. Abdomen:  Obese, soft, non-tender Musculoskeletal:  No acute deformity, pitting edema BLE  Skin:  Intact, MMM  LABS:  CBC  Recent Labs Lab 10/05/13 0308 10/06/13 0318 10/07/13 0339  WBC 7.6 7.3 9.0  HGB 10.9* 10.7* 10.9*  HCT 39.0 39.0 38.7  PLT 168 179 162   Coag's No results found for this basename: APTT, INR,  in the last 168 hours BMET  Recent Labs Lab 10/05/13 0308 10/06/13 0318 10/07/13 0339   NA 137 138 138  K 4.8 5.3 4.4  CL 98 97 93*  CO2 30 29 35*  BUN 45* 46* 40*  CREATININE 1.38* 1.44* 1.30*  GLUCOSE 125* 132* 83   Electrolytes  Recent Labs Lab 10/05/13 0308 10/06/13 0318 10/07/13 0339  CALCIUM 9.3 9.3 9.8   Sepsis Markers No results found for this basename: LATICACIDVEN, PROCALCITON, O2SATVEN,  in the last 168 hours ABG No results found for this basename: PHART, PCO2ART, PO2ART,  in the last 168 hours Liver Enzymes  Recent Labs Lab 10/04/13 1649  AST 17  ALT 9  ALKPHOS 60  BILITOT 0.8  ALBUMIN 3.0*   Cardiac Enzymes  Recent Labs Lab 10/04/13 1649 10/05/13 0850 10/05/13 1352 10/05/13 1914 10/07/13 0339  TROPONINI  --  <0.30 <0.30 <0.30  --   PROBNP 2649.0*  --   --   --  1411.0*   Glucose  Recent Labs Lab 10/06/13 0731 10/06/13 1127 10/06/13 1724 10/06/13 2148 10/07/13 0746 10/07/13 1136  GLUCAP 118* 172* 92 119* 100* 125*    Imaging No results found.   ASSESSMENT / PLAN:  Acute hypoxic respiratory failure Pulmonary Edema OSA / probable OHS - apparently formally diagnosed years ago but has never had follow up sleep study postop surgical intervention. Highly doubt PE - VQ with low probability Auto-immune work  up negative. Severe pulmonary likely due to OSA/OHV/CHF as all contributing factors, started on dobutamine and continue with diureses.  Would like to try CCB but BP would not permit at this point.  Another consideration once more hemodynamically stable would be revatio or cialis but will consider that once fluid status is more stable. Supplemental O2 as needed to maintain SpO2 greater than 85% Continue noctural CPAP. Aggressive diuresis as tolerated. Echo. CXR intermittently. Would arrange for BiPAP to be taken with patient upon discharge then sleep study as outpatient.Marland Kitchen  AKI - improving. Management per primary team.  Rush Farmer, M.D. [facility] Pulmonary/Critical Care Medicine. Pager: [phone]. After hours  pager: [phone].

## 2013-10-08 LAB — BASIC METABOLIC PANEL
Anion gap: 9 (ref 5–15)
BUN: 30 mg/dL — ABNORMAL HIGH (ref 6–23)
CALCIUM: 9.7 mg/dL (ref 8.4–10.5)
CO2: 37 mEq/L — ABNORMAL HIGH (ref 19–32)
Chloride: 96 mEq/L (ref 96–112)
Creatinine, Ser: 1.05 mg/dL (ref 0.50–1.10)
GFR calc non Af Amer: 56 mL/min — ABNORMAL LOW (ref >90)
GFR, EST AFRICAN AMERICAN: 65 mL/min — AB (ref >90)
Glucose, Bld: 87 mg/dL (ref 70–99)
POTASSIUM: 4.2 meq/L (ref 3.7–5.3)
SODIUM: 142 meq/L (ref 137–147)

## 2013-10-08 LAB — GLUCOSE, CAPILLARY
GLUCOSE-CAPILLARY: 95 mg/dL (ref 70–99)
Glucose-Capillary: 184 mg/dL — ABNORMAL HIGH (ref 70–99)
Glucose-Capillary: 126 mg/dL — ABNORMAL HIGH (ref 70–99)
Glucose-Capillary: 160 mg/dL — ABNORMAL HIGH (ref 70–99)

## 2013-10-08 LAB — PRO B NATRIURETIC PEPTIDE: Pro B Natriuretic peptide (BNP): 1089 pg/mL — ABNORMAL HIGH (ref 0–125)

## 2013-10-08 MED ORDER — SPIRONOLACTONE 25 MG PO TABS
25.0000 mg | ORAL_TABLET | Freq: Every day | ORAL | Status: DC
  Administered 2013-10-08 – 2013-10-12 (×5): 25 mg via ORAL
  Filled 2013-10-08 (×5): qty 1

## 2013-10-08 NOTE — Progress Notes (Signed)
[name]  Anna Clay [id number] DOB: 1950-04-23 DOA: 10/04/2013 PCP: Maximino Greenland, MD  Brief narrative: 63YO F hx diabetes, hypertension, dyslipidemia and obstructive sleep apnea previously treated with tonsillectomy/adenoidectomy and uvuloplasty. Unfortunately patient did not followup and have an additional sleep study done after that procedure. She had been having chest discomfort over several months and underwent a stress test with her cardiologist Dr. Einar Gip which was negative. He also performed a 2-D echocardiogram which revealed moderate to severe pulmonary hypertension of 60 mmHg with associated moderately dilated RV. She continued to have issues with progressive shortness of breath and peripheral edema and reported a 30 pound weight gain over several months. She denied further chest pain. During evaluation at her primary care physician's office she was noted to be quite hypoxemic - O2 sats between 60 and 70% range. She was sent to the emergency department for further evaluation.  Upon presentation to the ER she was hypoxemic and despite application of 5 L nasal cannula oxygen she was still satting in the mid 80s. EKG was nonischemic in appearance. Chest x-ray was consistent with moderate cardiomegaly with interstitial opacities and a mild right perihilar airspace opacity compatible with acute pulmonary edema. VQ scan demonstrated low probability for PE.  After admission she was evaluated by PCCM who recommended continuing diuresis noting at presentation patient does have mild acute kidney injury. A complete pulmonary hypertension workup was initiated including an autoimmune serology workup. Sleep study was recommended but will need to be completed in the outpatient setting. Cardiology was consulted regarding the RHF for possibility of pursuing RHC to clarify pulmonary HTN. Because of relative hypotension/borderline hyperkalermia dc'd Norvasc and ARB  to allow for maximum diuresis  Patient's cardiologist Dr. Einar Gip was consulted to assist in the management of her heart failure. Dobutamine was initiated. Per his recommendation no indication to pursue right heart catheterization at this point.   HPI/Subjective: Up in chair.  Slowly feeling less sob.  No new complaints.    Assessment/Plan:  Acute respiratory failure with hypoxia due to:   A) Pulmonary hypertension   B) Acute right-sided CHF  -Continue supportive care/wean oxygen as tolerated - cont 80 mg Lasix BID IV with dobutamine as ordered by cardiology - >13 liters off thus far - has significant RV dilatation so monitor for dizziness or presyncopal symptoms    Acute on chronic kidney injury -discontinued metformin 9/15 - ARB dc'd 9/17 due to relative hypotension - improving with the addition of dobutamine and continued diuresis  Diabetes mellitus -Metformin discontinued as noted above - CBGs controlled on sliding scale insulin - A1c 7.7  Obstructive sleep apnea -Formally diagnosed several years ago treated with airway surgery - never followed up - tolerating HS CPAP at this point - will need formal sleep study as an Financial planner aware - suspect inadequately treated sleep apnea primary etiology to patient's pulmonary hypertension and right-sided heart failure  Anemia anemia panel consistent with Fe deficiency anemia - will need screening colonoscopy when clinically stable (possibly outpt) - no evidence of gross blood loss   DVT prophylaxis: Subcutaneous heparin Code Status: Full Family Communication: no family present at time of exam today Disposition Plan/Expected LOS: Remain in step down due to need for dobutamine infusion  Consultants: PCCM Cardiology  Procedures: Bilateral lower extremity duplex negative for DVT  Antibiotics: None  Objective: Blood pressure 120/69, pulse 103, temperature 98.8 F (37.1 C), temperature source Oral, resp. rate 18, height 5'  3" (1.6 m), weight 125.238 kg (276 lb 1.6 oz), SpO2 95.00%.  Intake/Output Summary (Last 24 hours) at 10/08/13 1219 Last data filed at 10/08/13 0800  Gross per 24 hour  Intake    340 ml  Output   3900 ml  Net  -3560 ml   Exam: Gen: resting comfortably in chair in no resp distress  Chest: Lung sounds diminished due to body habitus, scattered diffuse crackles primarily in bases Cardiac: Regular rate and rhythm, no rubs murmurs or gallops Abdomen: Soft, obese, nontender, bs+ Extremities: 2+ B LE edema - no signif cyanosis  Scheduled Meds:  Scheduled Meds: . aspirin EC  81 mg Oral Daily  . cholecalciferol  2,000 Units Oral Daily  . furosemide  80 mg Intravenous Q12H  . heparin  5,000 Units Subcutaneous 3 times per day  . hydrALAZINE  25 mg Oral 3 times per day  . insulin aspart  0-15 Units Subcutaneous TID WC  . insulin aspart  0-5 Units Subcutaneous QHS  . Liraglutide  1.8 mL Subcutaneous Daily  . multivitamin with minerals  1 tablet Oral Daily  . nicotine  7 mg Transdermal Daily  . simvastatin  20 mg Oral Daily  . sodium chloride  3 mL Intravenous Q12H  . spironolactone  25 mg Oral Daily   Data Reviewed: Basic Metabolic Panel:  Recent Labs Lab 10/04/13 1649 10/05/13 0308 10/06/13 0318 10/07/13 0339 10/08/13 0340  NA 139 137 138 138 142  K 4.8 4.8 5.3 4.4 4.2  CL 98 98 97 93* 96  CO2 27 30 29  35* 37*  GLUCOSE 115* 125* 132* 83 87  BUN 46* 45* 46* 40* 30*  CREATININE 1.32* 1.38* 1.44* 1.30* 1.05  CALCIUM 9.8 9.3 9.3 9.8 9.7   Liver Function Tests:  Recent Labs Lab 10/04/13 1649  AST 17  ALT 9  ALKPHOS 60  BILITOT 0.8  PROT 6.8  ALBUMIN 3.0*   CBC:  Recent Labs Lab 10/04/13 1649 10/05/13 0308 10/06/13 0318 10/07/13 0339  WBC 9.7 7.6 7.3 9.0  NEUTROABS 6.7  --   --   --   HGB 11.9* 10.9* 10.7* 10.9*  HCT 42.1 39.0 39.0 38.7  MCV 69.5* 69.0* 68.4* 69.4*  PLT 178 168 179 162   Cardiac Enzymes:  Recent Labs Lab 10/05/13 0850 10/05/13 1352  10/05/13 1914  TROPONINI <0.30 <0.30 <0.30   CBG:  Recent Labs Lab 10/07/13 0746 10/07/13 1136 10/07/13 1541 10/07/13 2151 10/08/13 0811  GLUCAP 100* 125* 210* 113* 95    Recent Results (from the past 240 hour(s))  MRSA PCR SCREENING     Status: None   Collection Time    10/04/13  9:12 PM      Result Value Ref Range Status   MRSA by PCR NEGATIVE  NEGATIVE Final   Comment:            The GeneXpert MRSA Assay (FDA     approved for NASAL specimens     only), is one component of a     comprehensive MRSA colonization     surveillance program. It is not     intended to diagnose MRSA     infection nor to guide or     monitor treatment for     MRSA infections.     Studies:  Recent x-ray studies have been reviewed in detail by the Attending Physician  Time spent :  35 mins  Cherene Altes, MD [facility] For Consults/Admissions - Flow Manager -  [phone] Office  [phone] Pager [phone]  On-Call/Text Page:      [email or web address]      password [facility]  10/08/2013, 12:19 PM   LOS: 4 days

## 2013-10-08 NOTE — Progress Notes (Signed)
Placed patient on CPAP 10L bleed in for night time. Tolerating well will continue to monitor.

## 2013-10-09 LAB — BASIC METABOLIC PANEL
Anion gap: 8 (ref 5–15)
BUN: 21 mg/dL (ref 6–23)
CO2: 42 meq/L — AB (ref 19–32)
Calcium: 10 mg/dL (ref 8.4–10.5)
Chloride: 91 mEq/L — ABNORMAL LOW (ref 96–112)
Creatinine, Ser: 0.98 mg/dL (ref 0.50–1.10)
GFR calc Af Amer: 70 mL/min — ABNORMAL LOW (ref >90)
GFR, EST NON AFRICAN AMERICAN: 61 mL/min — AB (ref >90)
Glucose, Bld: 98 mg/dL (ref 70–99)
Potassium: 3.9 mEq/L (ref 3.7–5.3)
Sodium: 141 mEq/L (ref 137–147)

## 2013-10-09 LAB — CBC
HEMATOCRIT: 41 % (ref 36.0–46.0)
HEMOGLOBIN: 11.3 g/dL — AB (ref 12.0–15.0)
MCH: 19.3 pg — AB (ref 26.0–34.0)
MCHC: 27.6 g/dL — ABNORMAL LOW (ref 30.0–36.0)
MCV: 70.2 fL — AB (ref 78.0–100.0)
PLATELETS: 156 10*3/uL (ref 150–400)
RBC: 5.84 MIL/uL — AB (ref 3.87–5.11)
RDW: 23.5 % — ABNORMAL HIGH (ref 11.5–15.5)
WBC: 9.6 10*3/uL (ref 4.0–10.5)

## 2013-10-09 LAB — GLUCOSE, CAPILLARY
GLUCOSE-CAPILLARY: 185 mg/dL — AB (ref 70–99)
GLUCOSE-CAPILLARY: 151 mg/dL — AB (ref 70–99)
Glucose-Capillary: 214 mg/dL — ABNORMAL HIGH (ref 70–99)
Glucose-Capillary: 282 mg/dL — ABNORMAL HIGH (ref 70–99)

## 2013-10-09 LAB — OCCULT BLOOD X 1 CARD TO LAB, STOOL: Fecal Occult Bld: NEGATIVE

## 2013-10-09 LAB — PRO B NATRIURETIC PEPTIDE: Pro B Natriuretic peptide (BNP): 1017 pg/mL — ABNORMAL HIGH (ref 0–125)

## 2013-10-09 MED ORDER — SENNOSIDES-DOCUSATE SODIUM 8.6-50 MG PO TABS
1.0000 | ORAL_TABLET | Freq: Two times a day (BID) | ORAL | Status: DC
  Administered 2013-10-09 – 2013-10-12 (×5): 1 via ORAL
  Filled 2013-10-09 (×7): qty 1

## 2013-10-09 MED ORDER — FLEET ENEMA 7-19 GM/118ML RE ENEM
1.0000 | ENEMA | Freq: Every day | RECTAL | Status: DC | PRN
  Filled 2013-10-09: qty 1

## 2013-10-09 NOTE — Progress Notes (Signed)
CRITICAL VALUE ALERT  Critical value received:  Co2 42  Date of notification:  10/09/13  Time of notification:  0430  Critical value read back: yes  Nurse who received alert:  Carlena Sax, RN  MD notified (1st page):  T.Callahan, NP  Time of first page:  0453  MD notified (2nd page):   Time of second page:  Responding MD:  Fredirick Maudlin, NP  Time MD responded:    No new orders. M.Forest Gleason, RN

## 2013-10-09 NOTE — Progress Notes (Signed)
[name]  Anna Clay [id number] DOB: 1950/09/28 DOA: 10/04/2013 PCP: Maximino Greenland, MD  Brief narrative: 63yo F hx diabetes, hypertension, dyslipidemia and obstructive sleep apnea previously treated with tonsillectomy/adenoidectomy and uvuloplasty. Unfortunately patient did not followup and have an additional sleep study done after that procedure. She had been having chest discomfort over several months and underwent a stress test with her cardiologist Dr. Einar Gip which was negative. He also performed a 2-D echocardiogram which revealed severe pulmonary hypertension of 60 mmHg with associated moderately dilated RV. She continued to have issues with progressive shortness of breath and peripheral edema and reported a 30 pound weight gain over several months. She denied further chest pain. During evaluation at her primary care physician's office she was noted to be quite hypoxemic - O2 sats between 60 and 70% range. She was sent to the emergency department for further evaluation.  Upon presentation to the ER she was hypoxemic and despite application of 5 L nasal cannula oxygen she was still satting in the mid 80s. EKG was nonischemic in appearance. Chest x-ray was consistent with moderate cardiomegaly with interstitial opacities and a mild right perihilar airspace opacity compatible with acute pulmonary edema. VQ scan demonstrated low probability for PE.  After admission she was evaluated by PCCM who recommended continuing diuresis. A complete pulmonary hypertension workup was initiated including an autoimmune serology workup. Sleep study was recommended but will need to be completed in the outpatient setting. Cardiology was consulted regarding the RHF for possibility of pursuing RHC to clarify pulmonary HTN. Because of relative hypotension dc'd Norvasc and ARB.  Patient's cardiologist Dr. Einar Gip was consulted to assist in the management of her heart failure.  Dobutamine was initiated. Per his recommendation no indication to pursue right heart catheterization at this point.  HPI/Subjective: Feels somewhat weak today.  Also sob w/ attempts to ambulate.  No cp, n/v, or abdom pain.    Assessment/Plan:  Acute respiratory failure with hypoxia due to:   A) Pulmonary hypertension    B) Acute exacerbation of severe right-sided CHF    C) Pulmonary edema    D) Acute on chronic diastolic CHF  Dobutamine now stopped per Cardiology - continuing IV lasix - BP marginal, so may be reaching limit to diuresis w/o dobutamine support - net negative 16L as of today - wgt from 134 > 123 kg - has experienced some episodes of hypoxia today, so will keep in SDU for now     Acute kidney injury in CKD Stage 1 ARB dc'd 9/17 due to relative hypotension - improved w/ dobutamine and diuresis - follow   Diabetes mellitus 2 w/ renal complications  Metformin discontinued - CBGs controlled on sliding scale insulin - A1c 7.7  Obstructive sleep apnea / OHS Formally diagnosed several years ago - treated with airway surgery - never followed up - tolerating HS CPAP at this point - will need formal sleep study as an Financial planner aware - suspect inadequately treated sleep apnea primary etiology to patient's pulmonary hypertension and right-sided heart failure  Fe deficiency Anemia anemia panel consistent with Fe deficiency anemia - will need screening colonoscopy when clinically stable (possibly outpt) - no evidence of gross blood loss   Morbid obesity - Body mass index is 48.02 kg/(m^2).  DVT prophylaxis: Subcutaneous heparin Code Status: Full Family Communication: no family present at time of exam today Disposition Plan/Expected LOS: SDU  Consultants: PCCM Cardiology  Procedures: Bilateral lower extremity duplex  negative for DVT  Antibiotics: None  Objective: Blood pressure 94/55, pulse 108, temperature 99.1 F (37.3 C), temperature source Oral, resp. rate  11, height 5\' 3"  (1.6 m), weight 122.925 kg (271 lb), SpO2 78.00%.  Intake/Output Summary (Last 24 hours) at 10/09/13 1533 Last data filed at 10/09/13 1022  Gross per 24 hour  Intake    440 ml  Output   2575 ml  Net  -2135 ml   Exam: Gen: no acute resp distress in bedside chair  Chest: Lung sounds diminished due to body habitus, scattered diffuse crackles primarily in bases Cardiac: Regular rate and rhythm, no rubs murmurs or gallops Abdomen: Soft, obese, nontender, bs+ Extremities: 2+ B LE edema - no cyanosis  Scheduled Meds:  Scheduled Meds: . aspirin EC  81 mg Oral Daily  . cholecalciferol  2,000 Units Oral Daily  . furosemide  80 mg Intravenous Q12H  . heparin  5,000 Units Subcutaneous 3 times per day  . hydrALAZINE  25 mg Oral 3 times per day  . insulin aspart  0-15 Units Subcutaneous TID WC  . insulin aspart  0-5 Units Subcutaneous QHS  . Liraglutide  1.8 mL Subcutaneous Daily  . multivitamin with minerals  1 tablet Oral Daily  . nicotine  7 mg Transdermal Daily  . simvastatin  20 mg Oral Daily  . sodium chloride  3 mL Intravenous Q12H  . spironolactone  25 mg Oral Daily   Data Reviewed: Basic Metabolic Panel:  Recent Labs Lab 10/05/13 0308 10/06/13 0318 10/07/13 0339 10/08/13 0340 10/09/13 0310  NA 137 138 138 142 141  K 4.8 5.3 4.4 4.2 3.9  CL 98 97 93* 96 91*  CO2 30 29 35* 37* 42*  GLUCOSE 125* 132* 83 87 98  BUN 45* 46* 40* 30* 21  CREATININE 1.38* 1.44* 1.30* 1.05 0.98  CALCIUM 9.3 9.3 9.8 9.7 10.0   Liver Function Tests:  Recent Labs Lab 10/04/13 1649  AST 17  ALT 9  ALKPHOS 60  BILITOT 0.8  PROT 6.8  ALBUMIN 3.0*   CBC:  Recent Labs Lab 10/04/13 1649 10/05/13 0308 10/06/13 0318 10/07/13 0339 10/09/13 0310  WBC 9.7 7.6 7.3 9.0 9.6  NEUTROABS 6.7  --   --   --   --   HGB 11.9* 10.9* 10.7* 10.9* 11.3*  HCT 42.1 39.0 39.0 38.7 41.0  MCV 69.5* 69.0* 68.4* 69.4* 70.2*  PLT 178 168 179 162 156   Cardiac Enzymes:  Recent Labs Lab  10/05/13 0850 10/05/13 1352 10/05/13 1914  TROPONINI <0.30 <0.30 <0.30   CBG:  Recent Labs Lab 10/08/13 1157 10/08/13 1602 10/08/13 2327 10/09/13 0832 10/09/13 1236  GLUCAP 184* 126* 160* 185* 151*    Recent Results (from the past 240 hour(s))  MRSA PCR SCREENING     Status: None   Collection Time    10/04/13  9:12 PM      Result Value Ref Range Status   MRSA by PCR NEGATIVE  NEGATIVE Final   Comment:            The GeneXpert MRSA Assay (FDA     approved for NASAL specimens     only), is one component of a     comprehensive MRSA colonization     surveillance program. It is not     intended to diagnose MRSA     infection nor to guide or     monitor treatment for     MRSA infections.  Studies:  Recent x-ray studies have been reviewed in detail by the Attending Physician  Time spent :  49 mins  Cherene Altes, MD [facility] For Consults/Admissions - Flow Manager - [phone] Office  [phone] Pager [phone]  On-Call/Text Page:      [email or web address]      password [facility]  10/09/2013, 3:33 PM   LOS: 5 days

## 2013-10-09 NOTE — Progress Notes (Signed)
Patient refused CPAP tonight. There Is a machine in the room at this time. RN aware. Explained to Patient that if they changed their mind, to just have the RN call Respiratory and we would come set them up. 

## 2013-10-10 LAB — BASIC METABOLIC PANEL
ANION GAP: 7 (ref 5–15)
BUN: 16 mg/dL (ref 6–23)
CHLORIDE: 91 meq/L — AB (ref 96–112)
CO2: 41 mEq/L (ref 19–32)
Calcium: 9.7 mg/dL (ref 8.4–10.5)
Creatinine, Ser: 0.89 mg/dL (ref 0.50–1.10)
GFR, EST AFRICAN AMERICAN: 79 mL/min — AB (ref >90)
GFR, EST NON AFRICAN AMERICAN: 68 mL/min — AB (ref >90)
Glucose, Bld: 99 mg/dL (ref 70–99)
Potassium: 4.2 mEq/L (ref 3.7–5.3)
Sodium: 139 mEq/L (ref 137–147)

## 2013-10-10 LAB — CBC
HEMATOCRIT: 41.6 % (ref 36.0–46.0)
Hemoglobin: 11.4 g/dL — ABNORMAL LOW (ref 12.0–15.0)
MCH: 19.4 pg — ABNORMAL LOW (ref 26.0–34.0)
MCHC: 27.4 g/dL — AB (ref 30.0–36.0)
MCV: 70.9 fL — AB (ref 78.0–100.0)
PLATELETS: 149 10*3/uL — AB (ref 150–400)
RBC: 5.87 MIL/uL — ABNORMAL HIGH (ref 3.87–5.11)
RDW: 23.5 % — AB (ref 11.5–15.5)
WBC: 8.5 10*3/uL (ref 4.0–10.5)

## 2013-10-10 LAB — GLUCOSE, CAPILLARY
Glucose-Capillary: 118 mg/dL — ABNORMAL HIGH (ref 70–99)
Glucose-Capillary: 148 mg/dL — ABNORMAL HIGH (ref 70–99)
Glucose-Capillary: 172 mg/dL — ABNORMAL HIGH (ref 70–99)

## 2013-10-10 MED ORDER — METOPROLOL SUCCINATE 12.5 MG HALF TABLET
12.5000 mg | ORAL_TABLET | Freq: Every day | ORAL | Status: DC
  Administered 2013-10-10 – 2013-10-12 (×3): 12.5 mg via ORAL
  Filled 2013-10-10 (×3): qty 1

## 2013-10-10 MED ORDER — TORSEMIDE 20 MG PO TABS
40.0000 mg | ORAL_TABLET | Freq: Two times a day (BID) | ORAL | Status: DC
Start: 2013-10-10 — End: 2013-10-12
  Administered 2013-10-10 – 2013-10-11 (×4): 40 mg via ORAL
  Filled 2013-10-10 (×8): qty 2

## 2013-10-10 MED ORDER — AMLODIPINE BESYLATE 2.5 MG PO TABS
2.5000 mg | ORAL_TABLET | Freq: Every day | ORAL | Status: DC
  Administered 2013-10-10 – 2013-10-11 (×2): 2.5 mg via ORAL
  Administered 2013-10-12: 12:00:00 via ORAL
  Filled 2013-10-10 (×3): qty 1

## 2013-10-10 NOTE — Progress Notes (Signed)
Inpatient Diabetes Program Recommendations  AACE/ADA: New Consensus Statement on Inpatient Glycemic Control (2013)  Target Ranges:  Prepandial:   less than 140 mg/dL      Peak postprandial:   less than 180 mg/dL (1-2 hours)      Critically ill patients:  140 - 180 mg/dL   Inpatient Diabetes Program Recommendations Diet: add carb modified to current heart healthy diet Thank you  Raoul Pitch BSN, RN,CDE Inpatient Diabetes Coordinator [phone] (team pager)

## 2013-10-10 NOTE — Progress Notes (Signed)
Subjective:  Patient feels much improved with regard to shortness of breath.   No chest pain or palpitation. Has been having difficulty in weaning O2 off  Objective:  Vital Signs in the last 24 hours: Temp:  [97.6 F (36.4 C)-99.4 F (37.4 C)] 99 F (37.2 C) (09/21 0730) Pulse Rate:  [99-113] 109 (09/21 0730) Resp:  [11-25] 22 (09/21 0730) BP: (92-116)/(41-73) 100/51 mmHg (09/21 0730) SpO2:  [78 %-100 %] 91 % (09/21 0730) Weight:  [121.7 kg (268 lb 4.8 oz)] 121.7 kg (268 lb 4.8 oz) (09/21 0450)  Intake/Output from previous day: 09/20 0701 - 09/21 0700 In: 360 [P.O.:360] Out: 2950 [Urine:2950]  Physical Exam: General appearance: alert, cooperative, appears older than stated age, no distress and moderately obese  JVD elevated. Short neck.  Lungs:  Clear to auscultate. Heart: regular rate and rhythm, S1, S2 normal, no murmur, click, rub or gallop  Abdomen: Large pannus present, hepatojugular reflex present. Diffuse mild tenderness present, bowel sounds heard in all 4 quadrants.  Extremities: 2-3+ bilateral below-knee pitting edema present. No tenderness. Pulses: There is no carotid bruit, femoral pulses difficult to feel, popliteal pulse faint, pedal pulse, dorsalis pedis 1-2+, posterior tibial faint, difficult to feel due to edema.  Neurologic: Grossly normal  Lab Results: BMP  Recent Labs  10/08/13 0340 10/09/13 0310 10/10/13 0450  NA 142 141 139  K 4.2 3.9 4.2  CL 96 91* 91*  CO2 37* 42* 41*  GLUCOSE 87 98 99  BUN 30* 21 16  CREATININE 1.05 0.98 0.89  CALCIUM 9.7 10.0 9.7  GFRNONAA 56* 61* 68*  GFRAA 65* 70* 79*    CBC  Recent Labs Lab 10/04/13 1649  10/10/13 0450  WBC 9.7  < > 8.5  RBC 6.06*  < > 5.87*  HGB 11.9*  < > 11.4*  HCT 42.1  < > 41.6  PLT 178  < > 149*  MCV 69.5*  < > 70.9*  MCH 19.6*  < > 19.4*  MCHC 28.3*  < > 27.4*  RDW 23.6*  < > 23.5*  LYMPHSABS 2.5  --   --   MONOABS 0.4  --   --   EOSABS 0.1  --   --   BASOSABS 0.0  --   --   < >  = values in this interval not displayed.  HEMOGLOBIN A1C Lab Results  Component Value Date   HGBA1C 7.7* 10/05/2013   MPG 174* 10/05/2013    Cardiac Panel (last 3 results)  Recent Labs  10/05/13 0850 10/05/13 1352 10/05/13 1914  TROPONINI <0.30 <0.30 <0.30    BNP (last 3 results)  Recent Labs  10/07/13 0339 10/08/13 0340 10/09/13 0310  PROBNP 1411.0* 1089.0* 1017.0*    TSH  Recent Labs  10/06/13 0318  TSH 3.490    CHOLESTEROL No results found for this basename: CHOL,  in the last 8760 hours  Hepatic Function Panel  Recent Labs  10/04/13 1649  PROT 6.8  ALBUMIN 3.0*  AST 17  ALT 9  ALKPHOS 60  BILITOT 0.8   EKG: 10/04/2013: Sinus tachycardia at the rate of 106 beats per minute, P-Pulmonale, rightward axis, incomplete rectal branch block, low-voltage complexes, pulmonary disease pattern.  Outpatient echocardiogram 09/22/2013: Normal left ventricle systolic function, ejection fraction 55-60%, flattened septum suggests right ventricular volume overload. Left atrium mildly dilated, right atrium moderately dilated. Right ventricle moderately dilated with preserved RV systolic function. Moderate tricuspid regurgitation, moderate to severe pulmonary hypertension, PA pressure measuring 60 mm mercury.  IVC dilated with poor inspiratory collapse suggests elevated right heart pressure.  Outpatient Lexiscan Myoview stress test 8/21/015: Normal perfusion, mild breast attenuation artifact in the anterior wall, ejection fraction 55-60%, low risk study.   Scheduled Meds: . amLODipine  2.5 mg Oral Daily  . aspirin EC  81 mg Oral Daily  . cholecalciferol  2,000 Units Oral Daily  . heparin  5,000 Units Subcutaneous 3 times per day  . hydrALAZINE  25 mg Oral 3 times per day  . insulin aspart  0-15 Units Subcutaneous TID WC  . insulin aspart  0-5 Units Subcutaneous QHS  . Liraglutide  1.8 mL Subcutaneous Daily  . metoprolol succinate  12.5 mg Oral Daily  . multivitamin with  minerals  1 tablet Oral Daily  . nicotine  7 mg Transdermal Daily  . senna-docusate  1 tablet Oral BID  . simvastatin  20 mg Oral Daily  . sodium chloride  3 mL Intravenous Q12H  . spironolactone  25 mg Oral Daily  . torsemide  40 mg Oral BID   Continuous Infusions:   PRN Meds:.acetaminophen, famotidine, nitroGLYCERIN, sodium phosphate   Assessment/Plan:  1. Acute on chronic cor pulmonale with evidence of right ventricular heart failure, acute on chronic right ventricular systolic and diastolic heart failure.  Pulmonary hypertension due to Pickwickian syndrome with obesity hypoventilation, morbid obesity, COPD and ongoing tobacco use disorder and dietary indiscretion.  2. Acute on chronic diastolic heart failure  3. Moderate to severe pulmonary hypertension, probably secondary etiology that includes COPD, ongoing tobacco use, morbid obesity and chronic diastolic left ventricular heart failure.  4. Acute on renal insufficiency, stage I chronic kidney disease, S. Cr improved with Dobutamine infusion 5. Diabetes mellitus with renal complication  6. Tobacco use disorder  7. Morbid obesity with obesity hypoventilation.  8. Microcytic anemia, iron studies suggest iron patient's anemia. Given her COPD status, hemoglobin of 10.0 g suggests at least moderate if not severe anemia.  9. Diabetes mellitus type 2 uncontrolled, HbA1c 10/05/2013 7.7%.  10. History of gout no recurrence of the past one year, patient is presently on experimental medication on a research study drug.  Recommendation:  She is CO2 retainer. Hence try NCO2 and suspect after being on this for few hours, )2 sats will improve. Change IV lasix to Demadex po Bid. Add Amlodipine 2.5 mg daily and if tolerates, increase to 5 and 10 mg for pulmonary hypertension.  She has secondary PHT and needs to continue diuresis and I had long discussion regarding diet and salt restriction and smoking cessation.  Laverda Page, M.D. 10/10/2013,  9:31 AM [facility] Pager: [phone] Office: [phone] If no answer: [phone]   Will discontinue dubutamine. Tolerating spironolactone. Will add Metoprolol succinate 25 mg 1/2 tablet daily starting tomorrow if stable. Ambulate. Continue IV lasix for today and change to PO dose tomorrow. I will see  Her tomorrow. EKG S. Tach, P-Pulmonale.

## 2013-10-10 NOTE — H&P (Signed)
PULMONARY / CRITICAL CARE MEDICINE   Name: Anna Clay MRN: [id number] DOB: Apr 12, 1950    ADMISSION DATE:  10/04/2013 CONSULTATION DATE:  10/04/2013  REFERRING MD :  EDP  CHIEF COMPLAINT:  Dyspnea  INITIAL PRESENTATION: 63 year old female presented to [facility] ED 9/15 with SOB. SpO2 was in 60s on RA at time of admission. Improved to high 80s on 5L [place]. CXR consistent with Pulmonary edema. PCCM asked to see.   STUDIES:  9/15 BLE Doppler >>> prelim neg. 9/15 VQ Scan >>> low prob for PE.  SIGNIFICANT EVENTS: 9/15 admitted    SUBJECTIVE: Breathing much improved, tolerated CPAP for 4 hours well the previous 2 night, did not tolerate well last night. Now on [place],SpO2 93%.  She has orders to accept SpO2 > 85% for now.  VITAL SIGNS: Temp:  [97.6 F (36.4 C)-99.4 F (37.4 C)] 98.9 F (37.2 C) (09/21 1602) Pulse Rate:  [103-113] 113 (09/21 1602) Resp:  [15-25] 25 (09/21 1602) BP: (67-119)/(31-68) 93/58 mmHg (09/21 1604) SpO2:  [82 %-98 %] 86 % (09/21 1602) Weight:  [268 lb 4.8 oz (121.7 kg)] 268 lb 4.8 oz (121.7 kg) (09/21 0450) HEMODYNAMICS:   VENTILATOR SETTINGS:   INTAKE / OUTPUT:  Intake/Output Summary (Last 24 hours) at 10/10/13 1620 Last data filed at 10/10/13 1502  Gross per 24 hour  Intake    363 ml  Output   5300 ml  Net  -4937 ml    PHYSICAL EXAMINATION: General:  Morbidly obese female, in NAD, sitting up in recliner.  Neuro:  A&O x 3, MAE's. HEENT:  [place]/AT, PERRL Cardiovascular:  RRR, no MRG Lungs:  Resps even, minimally labored, mild scattered crackles. Abdomen:  Obese, soft, non-tender Musculoskeletal:  No acute deformity, pitting edema BLE  Skin:  Intact, MMM  LABS:  CBC  Recent Labs Lab 10/07/13 0339 10/09/13 0310 10/10/13 0450  WBC 9.0 9.6 8.5  HGB 10.9* 11.3* 11.4*  HCT 38.7 41.0 41.6  PLT 162 156 149*   Coag's No results found for this basename: APTT, INR,  in the last 168 hours BMET  Recent Labs Lab 10/08/13 0340 10/09/13 0310  10/10/13 0450  NA 142 141 139  K 4.2 3.9 4.2  CL 96 91* 91*  CO2 37* 42* 41*  BUN 30* 21 16  CREATININE 1.05 0.98 0.89  GLUCOSE 87 98 99   Electrolytes  Recent Labs Lab 10/08/13 0340 10/09/13 0310 10/10/13 0450  CALCIUM 9.7 10.0 9.7   Sepsis Markers No results found for this basename: LATICACIDVEN, PROCALCITON, O2SATVEN,  in the last 168 hours ABG No results found for this basename: PHART, PCO2ART, PO2ART,  in the last 168 hours Liver Enzymes  Recent Labs Lab 10/04/13 1649  AST 17  ALT 9  ALKPHOS 60  BILITOT 0.8  ALBUMIN 3.0*   Cardiac Enzymes  Recent Labs Lab 10/05/13 0850 10/05/13 1352 10/05/13 1914 10/07/13 0339 10/08/13 0340 10/09/13 0310  TROPONINI <0.30 <0.30 <0.30  --   --   --   PROBNP  --   --   --  1411.0* 1089.0* 1017.0*   Glucose  Recent Labs Lab 10/09/13 0832 10/09/13 1236 10/09/13 1546 10/09/13 2042 10/10/13 0728 10/10/13 1143  GLUCAP 185* 151* 214* 282* 118* 148*    Imaging No results found.   ASSESSMENT / PLAN:  Acute hypoxic respiratory failure Pulmonary Edema Probable pHTN (Pulmonary Hypertension) - mixed, but probably not PAH OSA / probable OHS - apparently formally diagnosed years ago but has never had follow  up sleep study postop surgical intervention. Highly doubt PE - VQ with low probability Supplemental O2 as needed to maintain SpO2 greater than 85% Continue noctural CPAP (at least 4 hours per night), advised patient to try different face fitting mask for comfort level.  Aggressive diuresis as tolerated.- Cardiology adjusting Echo. CXR intermittently. Outpatient sleep study. Autoimmune workup pending, given her hx of RA   AKI - improving. Management per primary team.   Pulmonary Consult time - 28mins  Vilinda Boehringer, MD [facility] Pager [phone] On Call Pager [phone]

## 2013-10-10 NOTE — Evaluation (Signed)
Physical Therapy Evaluation Patient Details Name: Anna Clay MRN: [id number] DOB: Sep 02, 1950 Today's Date: 10/10/2013   History of Present Illness  Anna Clay is a 63 y.o. female who presents to the ED with several week to month history of progressively worsening SOB.  Symptoms have been associated with peripheral edema  Clinical Impression  Pt very agreeable to mobility, though limited by O2 sats.  Pt placed on 3L O2 during conversation and mobility as pt desats to low 80's simply with conversing.  RN made aware of increase O2 from 2L to 3L.  Pt wishes to D/C to home and feel pt would benefit from [place] and Aide.  Will continue to follow.      Follow Up Recommendations Home health PT;Supervision/Assistance - 24 hour [facility])    Equipment Recommendations  None recommended by PT    Recommendations for Other Services       Precautions / Restrictions Precautions Precautions: Other (comment) Precaution Comments: Keep sats >85% Restrictions Weight Bearing Restrictions: No      Mobility  Bed Mobility               General bed mobility comments: pt in recliner  Transfers Overall transfer level: Needs assistance Equipment used: Rolling walker (2 wheeled) Transfers: Sit to/from Stand Sit to Stand: Supervision         General transfer comment: cues for UE use and controlling descent to sitting.    Ambulation/Gait Ambulation/Gait assistance: Min guard Ambulation Distance (Feet): 150 Feet Assistive device: Rolling walker (2 wheeled) Gait Pattern/deviations: Step-through pattern;Decreased stride length;Trunk flexed     General Gait Details: pt moves slowly and becomes SOB.  O2 sats 79-86% on 3L O2.   cues for pursed lip breathing and upright posture.    Stairs            Wheelchair Mobility    Modified Rankin (Stroke Patients Only)       Balance Overall balance assessment: Needs assistance         Standing balance support: No upper extremity  supported Standing balance-Leahy Scale: Fair Standing balance comment: pt unable to accept balance challenges without UE A.                               Pertinent Vitals/Pain Pain Assessment: No/denies pain    Home Living Family/patient expects to be discharged to:: Private residence Living Arrangements: Alone Available Help at Discharge: Family;Available PRN/intermittently (Family A with running errands and house chores.  ) Type of Home: Apartment Home Access: Ramped entrance     Home Layout: One level Home Equipment: Walker - 4 wheels;Shower seat;Cane - single point      Prior Function Level of Independence: Independent with assistive device(s)         Comments: used SPC in house and rollator outside     Hand Dominance   Dominant Hand: Right    Extremity/Trunk Assessment   Upper Extremity Assessment: Defer to OT evaluation           Lower Extremity Assessment: Generalized weakness         Communication   Communication: No difficulties  Cognition Arousal/Alertness: Awake/alert Behavior During Therapy: WFL for tasks assessed/performed Overall Cognitive Status: Within Functional Limits for tasks assessed                      General Comments      Exercises  Assessment/Plan    PT Assessment Patient needs continued PT services  PT Diagnosis Difficulty walking;Generalized weakness   PT Problem List Decreased strength;Decreased activity tolerance;Decreased balance;Decreased mobility;Decreased knowledge of use of DME;Cardiopulmonary status limiting activity  PT Treatment Interventions DME instruction;Gait training;Functional mobility training;Therapeutic activities;Therapeutic exercise;Balance training;Patient/family education   PT Goals (Current goals can be found in the Care Plan section) Acute Rehab PT Goals Patient Stated Goal: Home PT Goal Formulation: With patient Time For Goal Achievement: 10/24/13 Potential to  Achieve Goals: Good    Frequency Min 3X/week   Barriers to discharge        Co-evaluation               End of Session Equipment Utilized During Treatment: Oxygen Activity Tolerance: Patient limited by fatigue Patient left: in chair;with call bell/phone within reach Nurse Communication: Mobility status (O2 sats)         Time: [id number] PT Time Calculation (min): 24 min   Charges:   PT Evaluation $Initial PT Evaluation Tier I: 1 Procedure PT Treatments $Gait Training: 8-22 mins   PT G CodesCatarina Clay, Anna Clay 10/10/2013, 2:42 PM

## 2013-10-10 NOTE — Progress Notes (Signed)
[name]  Anna Clay [id number] DOB: 06-26-1950 DOA: 10/04/2013 PCP: Maximino Greenland, MD  Brief narrative: 63yo F hx diabetes, hypertension, dyslipidemia and obstructive sleep apnea previously treated with tonsillectomy/adenoidectomy and uvuloplasty. Unfortunately patient did not followup and have an additional sleep study done after that procedure. She had been having chest discomfort over several months and underwent a stress test with her cardiologist Dr. Einar Gip which was negative. He also performed a 2-D echocardiogram which revealed severe pulmonary hypertension of 60 mmHg with associated moderately dilated RV. She continued to have issues with progressive shortness of breath and peripheral edema and reported a 30 pound weight gain over several months. She denied further chest pain. During evaluation at her primary care physician's office she was noted to be quite hypoxemic - O2 sats between 60 and 70% range. She was sent to the emergency department for further evaluation.  Upon presentation to the ER she was hypoxemic and despite application of 5 L nasal cannula oxygen she was still satting in the mid 80s. EKG was nonischemic in appearance. Chest x-ray was consistent with moderate cardiomegaly with interstitial opacities and a mild right perihilar airspace opacity compatible with acute pulmonary edema. VQ scan demonstrated low probability for PE.  After admission she was evaluated by PCCM who recommended continuing diuresis. A complete pulmonary hypertension workup was initiated including an autoimmune serology workup. Sleep study was recommended but will need to be completed in the outpatient setting. Cardiology was consulted regarding the RHF for possibility of pursuing RHC to clarify pulmonary HTN. Because of relative hypotension dc'd Norvasc and ARB.  Patient's cardiologist Dr. Einar Gip was consulted to assist in the management of her heart failure.  Dobutamine was initiated. Per his recommendation no indication to pursue right heart catheterization at this point.  HPI/Subjective: Up in chair-did not use CPAP last night because it felt like "someone was farting on my face"   Assessment/Plan:  Acute respiratory failure with hypoxia due to:   A) Pulmonary hypertension    B) Acute exacerbation of severe right-sided CHF    C) Pulmonary edema    D) Acute on chronic diastolic CHF  Dobutamine off x >48 hrs per Cardiology - Cards to change IV lasix to Demadex and will cont Aldactone; also adding low dose Norvasc for pulm HTN - BP soft but [id number] systolic- wgt from [id number] > [id number] kg with net diuresis of 19,300 cc - still on 100% NRB but will see how does on RA at rest and with activity and if needs [place] oxygen - likely can accept sats ~85-86% and suspect will need CPAP and [place] O2 at home    Acute kidney injury in CKD Stage 1 ARB dc'd 9/17 due to relative hypotension - improved w/ dobutamine and diuresis - follow - current renal fnx is satble  Diabetes mellitus 2 w/ renal complications  Metformin discontinued - CBGs controlled on sliding scale insulin - A1c 7.7  Obstructive sleep apnea / OHS Formally diagnosed several years ago - treated with airway surgery - never followed up - tolerating HS CPAP at this point - will need formal sleep study as an Financial pl[employer] aware - suspect inadequately treated sleep apnea primary etiology to patient's pulmonary hypertension and right-sided heart failure - will ask RT to offer alternative CPAP mask such as nasal device since noted intolerance to current mask  Fe deficiency Anemia anemia panel consistent with Fe deficiency anemia - will need screening colonoscopy when  clinically stable (possibly outpt) - no evidence of gross blood loss   Morbid obesity - Body mass index is 47.54 kg/(m^2).  DVT prophylaxis: Subcutaneous heparin Code Status: Full Family Communication: no family present at time of exam  today Disposition Plan/Expected LOS: SDU  Consultants: PCCM Cardiology  Procedures: Bilateral lower extremity duplex negative for DVT  Antibiotics: None  Objective: Blood pressure 100/51, pulse 109, temperature 99 F (37.2 C), temperature source Oral, resp. rate 22, height 5\' 3"  (1.6 m), weight 268 lb 4.8 oz (121.7 kg), SpO2 91.00%.  Intake/Output Summary (Last 24 hours) at 10/10/13 1041 Last data filed at 10/10/13 0800  Gross per 24 hour  Intake      0 ml  Output   2600 ml  Net  -2600 ml   Exam: Gen: no acute resp distress in bedside chair  Chest: Lung sounds diminished due to body habitus, scattered fine crackles diffusely  Cardiac: Regular rate and rhythm, no rubs murmurs or gallops - distant HS Abdomen: Soft, obese, nontender, bs+ Extremities: 2+ B LE edema - tighter again but noted seated with legs dependent - no cyanosis  Scheduled Meds:  Scheduled Meds: . amLODipine  2.5 mg Oral Daily  . aspirin EC  81 mg Oral Daily  . cholecalciferol  2,000 Units Oral Daily  . heparin  5,000 Units Subcutaneous 3 times per day  . hydrALAZINE  25 mg Oral 3 times per day  . insulin aspart  0-15 Units Subcutaneous TID WC  . insulin aspart  0-5 Units Subcutaneous QHS  . Liraglutide  1.8 mL Subcutaneous Daily  . metoprolol succinate  12.5 mg Oral Daily  . multivitamin with minerals  1 tablet Oral Daily  . nicotine  7 mg Transdermal Daily  . senna-docusate  1 tablet Oral BID  . simvastatin  20 mg Oral Daily  . sodium chloride  3 mL Intravenous Q12H  . spironolactone  25 mg Oral Daily  . torsemide  40 mg Oral BID   Data Reviewed: Basic Metabolic Panel:  Recent Labs Lab 10/06/13 0318 10/07/13 0339 10/08/13 0340 10/09/13 0310 10/10/13 0450  NA 138 138 142 141 139  K 5.3 4.4 4.2 3.9 4.2  CL 97 93* 96 91* 91*  CO2 29 35* 37* 42* 41*  GLUCOSE 132* 83 87 98 99  BUN 46* 40* 30* 21 16  CREATININE 1.44* 1.30* 1.05 0.98 0.89  CALCIUM 9.3 9.8 9.7 10.0 9.7   Liver Function  Tests:  Recent Labs Lab 10/04/13 1649  AST 17  ALT 9  ALKPHOS 60  BILITOT 0.8  PROT 6.8  ALBUMIN 3.0*   CBC:  Recent Labs Lab 10/04/13 1649 10/05/13 0308 10/06/13 0318 10/07/13 0339 10/09/13 0310 10/10/13 0450  WBC 9.7 7.6 7.3 9.0 9.6 8.5  NEUTROABS 6.7  --   --   --   --   --   HGB 11.9* 10.9* 10.7* 10.9* 11.3* 11.4*  HCT 42.1 39.0 39.0 38.7 41.0 41.6  MCV 69.5* 69.0* 68.4* 69.4* 70.2* 70.9*  PLT 178 168 179 162 156 149*   Cardiac Enzymes:  Recent Labs Lab 10/05/13 0850 10/05/13 1352 10/05/13 1914  TROPONINI <0.30 <0.30 <0.30   CBG:  Recent Labs Lab 10/09/13 0832 10/09/13 1236 10/09/13 1546 10/09/13 2042 10/10/13 0728  GLUCAP 185* 151* 214* 282* 118*    Recent Results (from the past 240 hour(s))  MRSA PCR SCREENING     Status: None   Collection Time    10/04/13  9:12 PM  Result Value Ref Range Status   MRSA by PCR NEGATIVE  NEGATIVE Final   Comment:            The GeneXpert MRSA Assay (FDA     approved for NASAL specimens     only), is one component of a     comprehensive MRSA colonization     surveillance program. It is not     intended to diagnose MRSA     infection nor to guide or     monitor treatment for     MRSA infections.     Studies:  Recent x-ray studies have been reviewed in detail by the Attending Physician  Time spent :  35 mins  Erin Hearing, ANP [facility] For Consults/Admissions - Flow Manager - [phone] Office  [phone] Pager [phone]  On-Call/Text Page:      [email or web address]      password [facility]  10/10/2013, 10:41 AM   LOS: 6 days   I have personally examined this patient and reviewed the entire database. I have reviewed the above note, made any necessary editorial changes, and agree with its content.  Cherene Altes, MD [facility]

## 2013-10-10 NOTE — Evaluation (Signed)
Occupational Therapy Evaluation Patient Details Name: Anna Clay MRN: [id number] DOB: 1950/11/30 Today's Date: 10/10/2013    History of Present Illness Anna Clay is a 63 y.o. female who presents to the ED with several week to month history of progressively worsening SOB.  Symptoms have been associated with peripheral edema   Clinical Impression   This 63 yo female admitted with above presents to acute OT with decreasing sats with activity, decreased ROM at knees making it hard for her to complete LB ADLs--she will benefit from acute OT for AE and energy conservation education to she can return home alone.    Follow Up Recommendations  No OT follow up    Equipment Recommendations  3 in 1 bedside comode       Precautions / Restrictions Precautions Precautions: Other (comment) Precaution Comments: Keep sats >85% Restrictions Weight Bearing Restrictions: No      Mobility Bed Mobility Overal bed mobility: Modified Independent (HOB up)             General bed mobility comments: pt in recliner  Transfers Overall transfer level: Needs assistance Equipment used: Rolling walker (2 wheeled) Transfers: Sit to/from Stand Sit to Stand: Supervision         General transfer comment: Cues for safe hand placement. Pt ambulated to outside room door x2 with RW         ADL Overall ADL's : Needs assistance/impaired Eating/Feeding: Independent;Sitting   Grooming: Set up;Sitting   Upper Body Bathing: Set up;Sitting   Lower Body Bathing: Moderate assistance (with S sit<>stand)   Upper Body Dressing : Set up;Sitting   Lower Body Dressing: Moderate assistance (with S sit<stand)   Toilet Transfer: Supervision/safety;Ambulation;RW (Bed>door>recliner)   Toileting- Water quality scientist and Hygiene: Supervision/safety (with S sit<>stand)         General ADL Comments: Educated pt on purse lipped breathing, to do this 5 reps every time she reaches point B coming from  point A and to try to think to do it when she is acutally ambulating due to sats drop below 85% even on 2 liters of O2               Pertinent Vitals/Pain Pain Assessment: No/denies pain     Hand Dominance Right   Extremity/Trunk Assessment Upper Extremity Assessment Upper Extremity Assessment: Overall WFL for tasks assessed       Communication Communication Communication: No difficulties   Cognition Arousal/Alertness: Awake/alert Behavior During Therapy: WFL for tasks assessed/performed Overall Cognitive Status: Within Functional Limits for tasks assessed                                Home Living Family/patient expects to be discharged to:: Private residence Living Arrangements: Alone Available Help at Discharge: Family;Available PRN/intermittently (Family A with running errands and house chores.  ) Type of Home: Apartment Home Access: Ramped entrance     Home Layout: One level     Bathroom Shower/Tub: Tub/shower unit;Curtain Shower/tub characteristics: Architectural technologist: Standard     Home Equipment: Environmental consultant - 4 wheels;Shower seat;Cane - single point          Prior Functioning/Environment Level of Independence: Independent with assistive device(s)        Comments: used SPC in house and rollator outside    OT Diagnosis: Generalized weakness   OT Problem List: Decreased range of motion;Obesity;Cardiopulmonary status limiting activity   OT Treatment/Interventions: Self-care/ADL training;Patient/family education;DME and/or  AE instruction    OT Goals(Current goals can be found in the care plan section) Acute Rehab OT Goals Patient Stated Goal: home OT Goal Formulation: With patient Time For Goal Achievement: 10/17/13 Potential to Achieve Goals: Good  OT Frequency: Min 2X/week              End of Session Equipment Utilized During Treatment: Rolling walker;Oxygen ( 2 liters)  Activity Tolerance: Patient tolerated treatment  well Patient left: in chair;with call bell/phone within reach   Time: [id number] OT Time Calculation (min): 41 min Charges:  OT General Charges $OT Visit: 1 Procedure OT Evaluation $Initial OT Evaluation Tier I: 1 Procedure OT Treatments $Self Care/Home Management : 23-37 mins  Almon Register [id number] 10/10/2013, 3:59 PM

## 2013-10-11 DIAGNOSIS — I2789 Other specified pulmonary heart diseases: Secondary | ICD-10-CM

## 2013-10-11 DIAGNOSIS — J9600 Acute respiratory failure, unspecified whether with hypoxia or hypercapnia: Secondary | ICD-10-CM

## 2013-10-11 LAB — GLUCOSE, CAPILLARY
GLUCOSE-CAPILLARY: 169 mg/dL — AB (ref 70–99)
GLUCOSE-CAPILLARY: 144 mg/dL — AB (ref 70–99)
Glucose-Capillary: 124 mg/dL — ABNORMAL HIGH (ref 70–99)
Glucose-Capillary: 246 mg/dL — ABNORMAL HIGH (ref 70–99)

## 2013-10-11 NOTE — Care Management Note (Addendum)
  Page 2 of 2   10/12/2013     10:44:04 AM CARE MANAGEMENT NOTE 10/12/2013  Patient:  Anna Clay, Anna Clay   Account Number:  [id number]  Date Initiated:  10/06/2013  Documentation initiated by:  Anna Clay,Anna Clay  Subjective/Objective Assessment:   dx hypoxic resp failure; lives alone    PCP  Anna Clay     Action/Plan:   Anticipated DC Date:  10/12/2013   Anticipated DC Plan:  Anna Clay  CM consult      PAC Choice  [place]   Choice offered to / List presented to:  C-1 Patient   DME arranged  CPAP  [facility]  3-N-1      DME agency  [place] arranged  HH-1 RN  HH-10 DISEASE MANAGEMENT  Anna Clay.   Status of service:  Completed, signed off Medicare Important Message given?  YES (If response is "NO", the following Medicare IM given date fields will be blank) Date Medicare IM given:  10/11/2013 Medicare IM given by:  Anna Clay,Anna Clay Date Additional Medicare IM given:   Additional Medicare IM given by:    Discharge Disposition:  Anna Clay  Per UR Regulation:  Reviewed for med. necessity/level of care/duration of stay  If discussed at [place] of Stay Meetings, dates discussed:    Comments:  Anna Cuccia RN, BSN, MSHL, CCM  Nurse - Case Manager,  (Unit Anna [phone]  10/12/2013 Dispo Plan: Home with HHS:  RN, PT, HHA (Anna Clay notified) DME:  O2, Cpap, BSC ([facility]/notified) Sleep Study appt:   Anna Clay., MD On 11/30/2013. (Your appointment is at 3:00PM) Specialty: Pulmonary Disease [address] [place] [place] [address] [phone]    10/11/13 [name] RN MSN BSN CCM Pt will need home health RN, PT, and aide when discharged. Provided list of agencies and referral made to Anna Clay per choice.  Pt also to d/c on home O2 and  CPAP, Anna Clay has arranged sleep study.  10/06/13 [name] MSN BSN CCM Pt will need sleep study when discharged - Anna Clay to arrange. Pt states she has good support from her son who lives nearby and a nephew who lives in the same apt complex.  Has never been on home O2, now on CPAP 2/2 O2 sats in 80s on 5L/min [place].

## 2013-10-11 NOTE — Progress Notes (Signed)
[name]  Anna Clay [id number] DOB: 01/07/1951 DOA: 10/04/2013 PCP: Maximino Greenland, MD  Brief narrative: 63yo F hx diabetes, hypertension, dyslipidemia and obstructive sleep apnea previously treated with tonsillectomy/adenoidectomy and uvuloplasty. Unfortunately patient did not followup and have an additional sleep study done after that procedure. She had been having chest discomfort over several months and underwent a stress test with her cardiologist Dr. Einar Gip which was negative. He also performed a 2-D echocardiogram which revealed severe pulmonary hypertension of 60 mmHg with associated moderately dilated RV. She continued to have issues with progressive shortness of breath and peripheral edema and reported a 30 pound weight gain over several months. She denied further chest pain. During evaluation at her primary care physician's office she was noted to be quite hypoxemic - O2 sats between 60 and 70% range. She was sent to the emergency department for further evaluation.  Upon presentation to the ER she was hypoxemic and despite application of 5 L nasal cannula oxygen she was still satting in the mid 80s. EKG was nonischemic in appearance. Chest x-ray was consistent with moderate cardiomegaly with interstitial opacities and a mild right perihilar airspace opacity compatible with acute pulmonary edema. VQ scan demonstrated low probability for PE.  After admission she was evaluated by PCCM who recommended continuing diuresis. A complete pulmonary hypertension workup was initiated including an autoimmune serology workup. Sleep study was recommended but will need to be completed in the outpatient setting. Cardiology was consulted regarding the RHF for possibility of pursuing RHC to clarify pulmonary HTN. Because of relative hypotension dc'd Norvasc and ARB.  Patient's cardiologist Dr. Einar Gip was consulted to assist in the management of her heart failure.  Dobutamine was initiated and she had excellent diuretic response Per his recommendation no indication to pursue right heart catheterization at this point. She has transitioned to oral diuretics, low dose Norvasc was added and she is maintaining sats > 85% on 3L.  HPI/Subjective: Up in chair-tolerated CPAP much better with nasal mask-used all night  Assessment/Plan:  Acute respiratory failure with hypoxia due to:   A) Pulmonary hypertension    B) Acute exacerbation of severe right-sided CHF    C) Pulmonary edema    D) Acute on chronic diastolic CHF  Dobutamine off x >48 hrs per Cardiology - Cards changed IV lasix to Demadex on 9/21and continued Aldactone; also added low dose Norvasc for pulm HTN - BP soft but [id number] systolic so thus far tolerating- wgt from 134 > 120.2 kg with net diuresis of 24,000 cc -plan on dc on 3L and CPAP-autoimmune work up negative    Acute kidney injury in CKD Stage 1 ARB dc'd 9/17 due to relative hypotension - improved w/ dobutamine and diuresis - follow - current renal fnx stable  Diabetes mellitus 2 w/ renal complications  Metformin discontinued - CBGs controlled on sliding scale insulin - A1c 7.7-?? Use Tradgenta instead?  Obstructive sleep apnea / OHS Formally diagnosed several years ago - treated with airway surgery - never followed up - tolerating HS CPAP at this point -formal sleep study as an outpatient scheduled for Nov 11 at 300 pm - suspect inadequately treated sleep apnea primary etiology to patient's pulmonary hypertension and right-sided heart failure -tolerating nasal mask well  Fe deficiency Anemia anemia panel consistent with Fe deficiency anemia - will need screening colonoscopy when clinically stable /as an OP - no evidence of gross blood loss -Hgb stable at 11.4  Morbid  obesity - Body mass index is 46.95 kg/(m^2).  DVT prophylaxis: Subcutaneous heparin Code Status: Full Family Communication: no family at bedside Disposition Plan/Expected LOS:  Transfer to telemetry-likely dc in 24-48 hours  Consultants: PCCM Cardiology  Procedures: Bilateral lower extremity duplex negative for DVT  Antibiotics: None  Objective: Blood pressure 111/76, pulse 101, temperature 98 F (36.7 C), temperature source Oral, resp. rate 17, height 5\' 3"  (1.6 m), weight 264 lb 15.9 oz (120.2 kg), SpO2 90.00%.  Intake/Output Summary (Last 24 hours) at 10/11/13 1101 Last data filed at 10/11/13 1046  Gross per 24 hour  Intake    733 ml  Output   5350 ml  Net  -4617 ml   Exam: Gen: no acute resp distress in bedside chair  Chest: Lung sounds diminished due to body habitus, scattered fine crackles diffusely -3 L Cardiac: Regular rate and rhythm, no rubs murmurs or gallops - distant HS Abdomen: Soft, obese, nontender, bs+ Extremities: 2+ B LE edema - tighter again but noted seated with legs dependent - no cyanosis-TEDS in place  Scheduled Meds:  Scheduled Meds: . amLODipine  2.5 mg Oral Daily  . aspirin EC  81 mg Oral Daily  . cholecalciferol  2,000 Units Oral Daily  . heparin  5,000 Units Subcutaneous 3 times per day  . hydrALAZINE  25 mg Oral 3 times per day  . insulin aspart  0-15 Units Subcutaneous TID WC  . insulin aspart  0-5 Units Subcutaneous QHS  . Liraglutide  1.8 mL Subcutaneous Daily  . metoprolol succinate  12.5 mg Oral Daily  . multivitamin with minerals  1 tablet Oral Daily  . nicotine  7 mg Transdermal Daily  . senna-docusate  1 tablet Oral BID  . simvastatin  20 mg Oral Daily  . sodium chloride  3 mL Intravenous Q12H  . spironolactone  25 mg Oral Daily  . torsemide  40 mg Oral BID   Data Reviewed: Basic Metabolic Panel:  Recent Labs Lab 10/06/13 0318 10/07/13 0339 10/08/13 0340 10/09/13 0310 10/10/13 0450  NA 138 138 142 141 139  K 5.3 4.4 4.2 3.9 4.2  CL 97 93* 96 91* 91*  CO2 29 35* 37* 42* 41*  GLUCOSE 132* 83 87 98 99  BUN 46* 40* 30* 21 16  CREATININE 1.44* 1.30* 1.05 0.98 0.89  CALCIUM 9.3 9.8 9.7 10.0  9.7   Liver Function Tests:  Recent Labs Lab 10/04/13 1649  AST 17  ALT 9  ALKPHOS 60  BILITOT 0.8  PROT 6.8  ALBUMIN 3.0*   CBC:  Recent Labs Lab 10/04/13 1649 10/05/13 0308 10/06/13 0318 10/07/13 0339 10/09/13 0310 10/10/13 0450  WBC 9.7 7.6 7.3 9.0 9.6 8.5  NEUTROABS 6.7  --   --   --   --   --   HGB 11.9* 10.9* 10.7* 10.9* 11.3* 11.4*  HCT 42.1 39.0 39.0 38.7 41.0 41.6  MCV 69.5* 69.0* 68.4* 69.4* 70.2* 70.9*  PLT 178 168 179 162 156 149*   Cardiac Enzymes:  Recent Labs Lab 10/05/13 0850 10/05/13 1352 10/05/13 1914  TROPONINI <0.30 <0.30 <0.30   CBG:  Recent Labs Lab 10/09/13 2042 10/10/13 0728 10/10/13 1143 10/10/13 2122 10/11/13 0833  GLUCAP 282* 118* 148* 172* 124*    Recent Results (from the past 240 hour(s))  MRSA PCR SCREENING     Status: None   Collection Time    10/04/13  9:12 PM      Result Value Ref Range Status   MRSA  by PCR NEGATIVE  NEGATIVE Final   Comment:            The GeneXpert MRSA Assay (FDA     approved for NASAL specimens     only), is one component of a     comprehensive MRSA colonization     surveillance program. It is not     intended to diagnose MRSA     infection nor to guide or     monitor treatment for     MRSA infections.     Studies:  Recent x-ray studies have been reviewed in detail by the Attending Physician  Time spent :  35 mins  Erin Hearing, ANP [facility] For Consults/Admissions - Flow Manager - [phone] Office  [phone] Pager [phone]  On-Call/Text Page:      [email or web address]      password [facility]  10/11/2013, 11:01 AM   LOS: 7 days    Examined patient and discussed assessment and plan with ANP Ebony Hail. Patient with multiple complex medical problems> 40 minutes spent in direct patient care

## 2013-10-11 NOTE — Progress Notes (Signed)
Occupational Therapy Treatment Patient Details Name: Anna Clay MRN: [id number] DOB: September 04, 1950 Today's Date: 10/11/2013    History of present illness Anna Clay is a 63 y.o. female who presents to the ED with several week to month history of progressively worsening SOB.  Symptoms have been associated with peripheral edema   OT comments  Pt educated in energy conservation with handout provided.  Instructed in use of AE for LB ADL.  Pt able to recall purse lip breathing techniques.  Moving well.  Follow Up Recommendations  No OT follow up    Equipment Recommendations  3 in 1 bedside comode    Recommendations for Other Services      Precautions / Restrictions Precautions Precaution Comments: Keep sats >85%       Mobility Bed Mobility Overal bed mobility: Modified Independent                Transfers                      Balance                                   ADL                           Toilet Transfer: Modified Independent;Ambulation;Regular Toilet   Toileting- Clothing Manipulation and Hygiene: Modified independent;Sit to/from stand         General ADL Comments: Pt instructed in energy conservation strategies and given handout.  Pt able to recall and employ purse lip breathing techniques taught last visit.  Pt issued AE for LB bathing and dressing as she is unable to afford items not covered by insurance.  Pt demonstrated and verbalized understanding in use.       Vision                     Perception     Praxis      Cognition   Behavior During Therapy: WFL for tasks assessed/performed Overall Cognitive Status: Within Functional Limits for tasks assessed                       Extremity/Trunk Assessment               Exercises     Shoulder Instructions       General Comments      Pertinent Vitals/ Pain       Pain Assessment: No/denies pain  Home Living                                          Prior Functioning/Environment              Frequency Min 2X/week     Progress Toward Goals  OT Goals(current goals can now be found in the care plan section)  Progress towards OT goals: Progressing toward goals  Acute Rehab OT Goals Patient Stated Goal: home  Plan Discharge plan remains appropriate    Co-evaluation                 End of Session     Activity Tolerance Patient tolerated treatment well   Patient Left in chair;with call bell/phone within reach   Nurse Communication  (  extended 02 tubing so pt may get to bathroom)        Time: [id number] OT Time Calculation (min): 35 min  Charges: OT General Charges $OT Visit: 1 Procedure OT Treatments $Self Care/Home Management : 23-37 mins  Malka So 10/11/2013, 12:57 PM [phone]

## 2013-10-11 NOTE — Progress Notes (Signed)
Called report to [place], RN on 3E.  Updated on patient history, current status, and plan of care.  Patient is stable and able to transfer at this time.

## 2013-10-11 NOTE — Progress Notes (Signed)
Patient seen and examined with PA R. Shearon Stalls.  Lab, images, and vitals reviewed. Agree with PA R. Desai assessment and plan.   Vilinda Boehringer, MD [facility] Pager [phone] On Call Pager [phone]

## 2013-10-11 NOTE — Progress Notes (Signed)
PULMONARY / CRITICAL CARE MEDICINE   Name: Anna Clay MRN: [id number] DOB: Sep 06, 1950    ADMISSION DATE:  10/04/2013 CONSULTATION DATE:  10/04/2013  REFERRING MD :  EDP  CHIEF COMPLAINT:  Dyspnea  INITIAL PRESENTATION: 63 year old female presented to [facility] ED 9/15 with SOB. SpO2 was in 60s on RA at time of admission. Improved to high 80s on 5L [place]. CXR consistent with Pulmonary edema. PCCM asked to see.   STUDIES:  9/15 BLE Doppler >>> neg. 9/15 VQ Scan >>> low prob for PE. 9/17 Autoimmune panel >>> CRP mildly elevated at 1.3, ESR neg, dsDNA neg, ACE neg, RF neg, ANA neg.  SIGNIFICANT EVENTS: 9/15 admitted    SUBJECTIVE: Breathing continues to improve.  Tolerating CPAP overnight.  On [place] this AM, 4L with SpO2 93%.  VITAL SIGNS: Temp:  [98 F (36.7 C)-99 F (37.2 C)] 98 F (36.7 C) (09/22 0830) Pulse Rate:  [101-118] 101 (09/22 0830) Resp:  [14-25] 17 (09/22 0830) BP: (67-119)/(31-76) 111/76 mmHg (09/22 0830) SpO2:  [82 %-92 %] 90 % (09/22 0830) Weight:  [120.2 kg (264 lb 15.9 oz)] 120.2 kg (264 lb 15.9 oz) (09/22 0324) HEMODYNAMICS:   VENTILATOR SETTINGS:   INTAKE / OUTPUT:  Intake/Output Summary (Last 24 hours) at 10/11/13 1032 Last data filed at 10/11/13 0949  Gross per 24 hour  Intake    736 ml  Output   5650 ml  Net  -4914 ml    PHYSICAL EXAMINATION: General:  Morbidly obese female, in NAD, sitting up in recliner.  Neuro:  A&O x 3, MAE's. HEENT:  [place]/AT, PERRL, 4L [place]. Cardiovascular:  RRR, no MRG Lungs:  CTA anteriorlly, no W/R/R. Abdomen:  Obese, soft, non-tender Musculoskeletal:  No acute deformity, mild edema LE's. Skin:  Intact, MMM  LABS:  CBC  Recent Labs Lab 10/07/13 0339 10/09/13 0310 10/10/13 0450  WBC 9.0 9.6 8.5  HGB 10.9* 11.3* 11.4*  HCT 38.7 41.0 41.6  PLT 162 156 149*   Coag's No results found for this basename: APTT, INR,  in the last 168 hours BMET  Recent Labs Lab 10/08/13 0340 10/09/13 0310 10/10/13 0450  NA 142 141  139  K 4.2 3.9 4.2  CL 96 91* 91*  CO2 37* 42* 41*  BUN 30* 21 16  CREATININE 1.05 0.98 0.89  GLUCOSE 87 98 99   Electrolytes  Recent Labs Lab 10/08/13 0340 10/09/13 0310 10/10/13 0450  CALCIUM 9.7 10.0 9.7   Sepsis Markers No results found for this basename: LATICACIDVEN, PROCALCITON, O2SATVEN,  in the last 168 hours ABG No results found for this basename: PHART, PCO2ART, PO2ART,  in the last 168 hours Liver Enzymes  Recent Labs Lab 10/04/13 1649  AST 17  ALT 9  ALKPHOS 60  BILITOT 0.8  ALBUMIN 3.0*   Cardiac Enzymes  Recent Labs Lab 10/05/13 0850 10/05/13 1352 10/05/13 1914 10/07/13 0339 10/08/13 0340 10/09/13 0310  TROPONINI <0.30 <0.30 <0.30  --   --   --   PROBNP  --   --   --  1411.0* 1089.0* 1017.0*   Glucose  Recent Labs Lab 10/09/13 1546 10/09/13 2042 10/10/13 0728 10/10/13 1143 10/10/13 2122 10/11/13 0833  GLUCAP 214* 282* 118* 148* 172* 124*    Imaging No results found.   ASSESSMENT / PLAN:  Acute hypoxic respiratory failure Pulmonary Edema - improving  Probable pHTN (Pulmonary Hypertension) - mixed, but probably not PAH OSA / probable OHS - apparently formally diagnosed years ago but has never  had follow up sleep study postop surgical intervention. Highly doubt PE - VQ with low probability Supplemental O2 as needed to maintain SpO2 greater than 85% Continue noctural CPAP (at least 4 hours per night), advised patient to try different face fitting mask for comfort level.  Continue aggressive diuresis as tolerated.- Cardiology has adjusted regimen. CXR intermittently. Outpatient sleep study (has been scheduled for Nov. 11 at 3:00PM).   Montey Hora, [facility] Pgr: [phone]  or [phone]

## 2013-10-12 ENCOUNTER — Encounter (HOSPITAL_COMMUNITY): Payer: Self-pay | Admitting: Pulmonary Disease

## 2013-10-12 LAB — BASIC METABOLIC PANEL
Anion gap: 10 (ref 5–15)
BUN: 24 mg/dL — ABNORMAL HIGH (ref 6–23)
CO2: 42 mEq/L (ref 19–32)
Calcium: 9.8 mg/dL (ref 8.4–10.5)
Chloride: 88 mEq/L — ABNORMAL LOW (ref 96–112)
Creatinine, Ser: 1.02 mg/dL (ref 0.50–1.10)
GFR calc Af Amer: 67 mL/min — ABNORMAL LOW (ref >90)
GFR, EST NON AFRICAN AMERICAN: 58 mL/min — AB (ref >90)
Glucose, Bld: 101 mg/dL — ABNORMAL HIGH (ref 70–99)
Potassium: 3.5 mEq/L — ABNORMAL LOW (ref 3.7–5.3)
SODIUM: 140 meq/L (ref 137–147)

## 2013-10-12 MED ORDER — TORSEMIDE 20 MG PO TABS: 40.0000 mg | ORAL_TABLET | Freq: Every day | ORAL | Status: DC

## 2013-10-12 MED ORDER — POTASSIUM CHLORIDE CRYS ER 20 MEQ PO TBCR: 20.0000 meq | EXTENDED_RELEASE_TABLET | Freq: Every day | ORAL | Status: DC

## 2013-10-12 MED ORDER — SPIRONOLACTONE 25 MG PO TABS: 25.0000 mg | ORAL_TABLET | Freq: Every day | ORAL | Status: DC

## 2013-10-12 MED ORDER — METOPROLOL SUCCINATE 12.5 MG HALF TABLET: 12.5000 mg | ORAL_TABLET | Freq: Every day | ORAL | Status: DC

## 2013-10-12 MED ORDER — NICOTINE 7 MG/24HR TD PT24: 7.0000 mg | MEDICATED_PATCH | Freq: Every day | TRANSDERMAL | Status: DC

## 2013-10-12 MED ORDER — HYDRALAZINE HCL 25 MG PO TABS: 25.0000 mg | ORAL_TABLET | Freq: Three times a day (TID) | ORAL | Status: DC

## 2013-10-12 MED ORDER — AMLODIPINE BESYLATE 2.5 MG PO TABS: 2.5000 mg | ORAL_TABLET | Freq: Every day | ORAL | Status: DC

## 2013-10-12 MED ORDER — POTASSIUM CHLORIDE CRYS ER 20 MEQ PO TBCR
40.0000 meq | EXTENDED_RELEASE_TABLET | Freq: Once | ORAL | Status: AC
  Administered 2013-10-12: 40 meq via ORAL
  Filled 2013-10-12: qty 2

## 2013-10-12 NOTE — Discharge Summary (Addendum)
Physician Discharge Summary  Anna Clay [id number] DOB: 01/13/1951 DOA: 10/04/2013  PCP: Maximino Greenland, MD  Admit date: 10/04/2013 Discharge date: 10/12/2013  Time spent: >30 minutes  Discharge Diagnoses:    Severe Pulmonary hypertension   Acute right-sided CHF (congestive heart failure)-resolving/compensating   Acute respiratory failure with hypoxia-now with chronic component   AKI on CKD stage 3   Diabetes mellitus-Metformin dc'd this admission due to CKD   OSA (obstructive sleep apnea)   Anemia  Discharge Condition: stable  Diet recommendation: Heart Healthy, 2 gm Na, 1500 cc FR and carbohydrate modified  Filed Weights   10/10/13 0450 10/11/13 0324 10/12/13 0430  Weight: 121.7 kg (268 lb 4.8 oz) 120.2 kg (264 lb 15.9 oz) 116.9 kg (257 lb 11.5 oz)    History of present illness:  63yo F hx diabetes, hypertension, dyslipidemia and obstructive sleep apnea previously treated with tonsillectomy/adenoidectomy and uvuloplasty. Unfortunately patient did not followup and have an additional sleep study done after that procedure. She had been having chest discomfort over several months and underwent a stress test with her cardiologist Dr. Einar Gip which was negative. He also performed a 2-D echocardiogram which revealed severe pulmonary hypertension of 60 mmHg with associated moderately dilated RV. She continued to have issues with progressive shortness of breath and peripheral edema and reported a 30 pound weight gain over several months. She denied further chest pain. During evaluation at her primary care physician's office she was noted to be quite hypoxemic - O2 sats between 60 and 70% range. She was sent to the emergency department for further evaluation.   Upon presentation to the ER she was hypoxemic and despite application of 5 L nasal cannula oxygen she was still satting in the mid 80s. EKG was nonischemic in appearance. Chest x-ray was consistent with moderate cardiomegaly with  interstitial opacities and a mild right perihilar airspace opacity compatible with acute pulmonary edema. VQ scan demonstrated low probability for PE.   After admission she was evaluated by PCCM who recommended continuing diuresis. A complete pulmonary hypertension workup was initiated including an autoimmune serology workup. Sleep study was recommended but will need to be completed in the outpatient setting. Cardiology was consulted regarding the RHF for possibility of pursuing RHC to clarify pulmonary HTN. Because of relative hypotension dc'd Norvasc and ARB.   Patient's cardiologist Dr. Einar Gip was consulted to assist in the management of her heart failure. Dobutamine was initiated and she had excellent diuretic response Per his recommendation no indication to pursue right heart catheterization at this point. She has transitioned to oral diuretics, low dose Norvasc was added and she is maintaining sats > 85% on 3L  Hospital Course:   Acute respiratory failure with hypoxia due to:  A) Pulmonary hypertension  B) Acute exacerbation of severe right-sided CHF  C) Pulmonary edema  D) Acute on chronic diastolic CHF  E) COPD Required Dobutamine but has been stable off x >72 hrs. Diuresed well initially with IV Lasix with Aldactone. Cards later changed to Demadex (on 9/21) and continued Aldactone; also resumed Norvasc at lower than pre admit dose for pulm HTN.  BP soft but [id number] systolic so thus far tolerating these changes. Her weight has dereased from 134 kg to 116.9 kg on date of dc with net diuresis of >24,000 cc. Plan to dc on 3L and CPAP. Autoimmune work up negative. Cards plans OP right heart cath when euvolemic. Had also used hydralazine but this was stopped on date of dc. Continue Nicotine  patch at dc for smoking cessation. Has appointment with Ms Parrett NP on 9/29 at 3pm for hospital follow up.  Hypokalemia Have added low dose Kdur daily for use at home- will need BMET in 1 week since on  Aldactone.  Acute kidney injury in CKD Stage 1  ARB dc'd 9/17 due to relative hypotension - improved w/ dobutamine and diuresis - follow - current renal fnx stable   Diabetes mellitus 2 w/ renal complications  Metformin discontinued at admission - CBGs controlled on sliding scale insulin - A1c 7.7-consider replace Metformin with Tradgenta -continue Victoza after dc.  Obstructive sleep apnea / OHS  Formally diagnosed several years ago - treated with airway surgery - never followed up - tolerating HS CPAP at this point -formal sleep study as an outpatient pending office visit with pulmonologist on Nov 11 at 300 pm for sleep study evaluation - suspect inadequately treated sleep apnea primary etiology to patient's pulmonary hypertension and right-sided heart failure -tolerating nasal mask well -pulmonary recommends PFTs as an OP  Fe deficiency Anemia  anemia panel consistent with Fe deficiency anemia - will need screening colonoscopy when clinically stable /as an OP - no evidence of gross blood loss -Hgb stable at 11.4   Morbid obesity - Body mass index is 46.95 kg/(m^2).  Procedures: Bilateral lower extremity duplex negative for DVT  Consultations: PCCM  Cardiology  Discharge Exam: Filed Vitals:   10/12/13 0430  BP: 95/47  Pulse: 59  Temp: 98.8 F (37.1 C)  Resp: 18   Gen: no acute resp distress in bedside chair  Chest: Lung sounds diminished due to body habitus, scattered fine crackles diffusely -3 L  Cardiac: Regular rate and rhythm, no rubs murmurs or gallops - distant HS  Abdomen: Soft, obese, nontender, bs+  Extremities: 2+ B LE edema - no cyanosis - TEDS in place  Discharge Medication List as of 10/12/2013  1:58 PM    START taking these medications   Details  metoprolol succinate (TOPROL-XL) 12.5 mg TB24 24 hr tablet Take 0.5 tablets (12.5 mg total) by mouth daily., Starting 10/12/2013, Until Discontinued, Print    nicotine (NICODERM CQ - DOSED IN MG/24 HR) 7 mg/24hr  patch Place 1 patch (7 mg total) onto the skin daily., Starting 10/12/2013, Until Discontinued, Print    potassium chloride SA (K-DUR,KLOR-CON) 20 MEQ tablet Take 1 tablet (20 mEq total) by mouth daily., Starting 10/13/2013, Until Discontinued, Print    spironolactone (ALDACTONE) 25 MG tablet Take 1 tablet (25 mg total) by mouth daily., Starting 10/12/2013, Until Discontinued, Print    torsemide (DEMADEX) 20 MG tablet Take 2 tablets (40 mg total) by mouth daily., Starting 10/13/2013, Until Discontinued, Print      CONTINUE these medications which have CHANGED   Details  amLODipine (NORVASC) 2.5 MG tablet Take 1 tablet (2.5 mg total) by mouth daily., Starting 10/12/2013, Until Discontinued, Print      CONTINUE these medications which have NOT CHANGED   Details  acetaminophen (TYLENOL) 650 MG CR tablet Take 1,300 mg by mouth every 8 (eight) hours as needed for pain., Until Discontinued, Historical Med    aspirin EC 81 MG tablet Take 81 mg by mouth daily., Until Discontinued, Historical Med    Cholecalciferol (VITAMIN D) 2000 UNITS CAPS Take 1 capsule by mouth daily., Until Discontinued, Historical Med    Flaxseed, Linseed, (FLAXSEED OIL MAX STR) 1300 MG CAPS Take 1 capsule by mouth daily., Until Discontinued, Historical Med    Liraglutide (VICTOZA) 18 MG/3ML SOPN  Inject 1.8 mLs into the skin daily., Until Discontinued, Historical Med    Multiple Vitamin (MULTIVITAMIN WITH MINERALS) TABS tablet Take 1 tablet by mouth daily., Until Discontinued, Historical Med    nitroGLYCERIN (NITROSTAT) 0.4 MG SL tablet Place 0.4 mg under the tongue every 5 (five) minutes as needed for chest pain., Until Discontinued, Historical Med    ranitidine (ZANTAC) 150 MG tablet Take 150 mg by mouth daily as needed for heartburn., Until Discontinued, Historical Med    simvastatin (ZOCOR) 20 MG tablet Take 20 mg by mouth daily., Until Discontinued, Historical Med    STUDY MEDICATION Take 2 capsules by mouth daily.  Take 1 capsule from each bottle [id number] and [id number]) every morning. [employer], Protocol No. [id number]. Study of Febuxostat, Allopurinol, or Placebo., Until Discontinued, Historical Med      STOP taking these medications     furosemide (LASIX) 40 MG tablet      metFORMIN (GLUMETZA) 500 MG (MOD) 24 hr tablet      olmesartan (BENICAR) 40 MG tablet        Allergies  Allergen Reactions  . Ivp Dye [Iodinated Diagnostic Agents] Other (See Comments)    Reaction unknown  . Tetracyclines & Related Other (See Comments)    Reaction unknown   Follow-up Information   Follow up with [name], MD On 11/30/2013. (Your appointment is at 3:00PM)    Specialty:  Pulmonary Disease   Contact information:   [address] [address] [phone]       Follow up with [facility]. (Equities trader, Physical Therapy, [place] to start within 24-48 hours of discharge)    Contact information:   [address] [place]  [address] [phone]       Follow up with Despina Hick, MD. Schedule an appointment as soon as possible for a visit on 10/19/2013. (@2 :45 pm spoke with [facility] )    Specialty:  Cardiology   Contact information:   [address] [place] [address] [phone]       Follow up with [facility], NP On 10/18/2013. (3 pm)    Specialty:  Nurse Practitioner   Contact information:   [place]. [place] [address] [phone]       The results of significant diagnostics from this hospitalization (including imaging, microbiology, ancillary and laboratory) are listed below for reference.    Significant Diagnostic Studies: Nm Pulmonary Perf And Vent  10/04/2013   CLINICAL DATA:  Shortness of breath for 2 months.  EXAM: NUCLEAR MEDICINE VENTILATION - PERFUSION LUNG SCAN  TECHNIQUE: Ventilation images were obtained in multiple projections  using inhaled aerosol technetium 99 M DTPA. Perfusion images were obtained in multiple projections after intravenous injection of Tc-33m MAA.  RADIOPHARMACEUTICALS:  40 mCi Tc-62m DTPA aerosol and 6 mCi Tc-61m MAA  COMPARISON:  Chest radiograph performed earlier today at 5:24 p.m.  FINDINGS: Ventilation: No significant focal ventilation defect is identified, aside from the shadow of the patient's mildly enlarged heart. Increased accumulation of activity at the left perihilar region is likely artifactual in nature.  Perfusion: No wedge shaped peripheral perfusion defects to suggest acute pulmonary embolism.  IMPRESSION: Low probability for pulmonary embolus.   Electronically Signed   By: Garald Balding M.D.   On: 10/04/2013 22:47   Dg Chest Portable 1 View  10/04/2013   CLINICAL DATA:  Shortness of breath. Cough and congestion. COPD. Diabetes.  Tobacco use.  EXAM: PORTABLE CHEST - 1 VIEW  COMPARISON:  01/20/2008  FINDINGS: Moderately enlarged cardiopericardial silhouette with diffuse interstitial opacity and mild right perihilar airspace opacity.  Low lung volumes.  IMPRESSION: 1. Moderate cardiomegaly with interstitial opacity and mild right perihilar airspace opacity compatible with acute pulmonary edema.   Electronically Signed   By: Sherryl Barters M.D.   On: 10/04/2013 17:43   Mm Digital Diagnostic Unilat L  09/19/2013   CLINICAL DATA:  Recall from screening mammogram. The patient does state that she has developed fluid retention with a 40 pound weight gain.  EXAM: DIGITAL DIAGNOSTIC  left breast MAMMOGRAM WITH CAD  COMPARISON:  09/08/2013, 06/09/2008.  ACR Breast Density Category b: There are scattered areas of fibroglandular density.  FINDINGS: The patient has developed increased trabecular markings within the left breast related to development of diffuse subcutaneous edema.  There is a small group of calcifications located inferiorly and slightly medially within the left breast. There are no worrisome  linear or branching forms. These span 2 mm. These are felt to represent probably benign calcifications. I recommend followup diagnostic mammography at 6, 12, and 24 months.  Mammographic images were processed with CAD.  On physical exam, there are edematous changes within the lower extremities and there is diffuse subcutaneous edema. There is no palpable mass within the left breast.  IMPRESSION: Increased trabecular markings within the left breast related to subcutaneous edematous changes as discussed above.  Small (2 mm) group of probably benign calcifications located slightly medially and inferiorly within the left breast. Recommend left breast diagnostic mammogram in 6 months.  RECOMMENDATION: Left breast diagnostic mammogram in 6 months.  I have discussed the findings and recommendations with the patient. Results were also provided in writing at the conclusion of the visit. If applicable, a reminder letter will be sent to the patient regarding the next appointment.  BI-RADS CATEGORY  3: Probably benign.   Electronically Signed   By: Luberta Robertson M.D.   On: 09/19/2013 14:52   US Breast Ltd Uni Left Inc [place]  09/19/2013   CLINICAL DATA:  Recall from screening mammogram. The patient does state that she has developed fluid retention with a 40 pound weight gain.  EXAM: DIGITAL DIAGNOSTIC  left breast MAMMOGRAM WITH CAD  COMPARISON:  09/08/2013, 06/09/2008.  ACR Breast Density Category b: There are scattered areas of fibroglandular density.  FINDINGS: The patient has developed increased trabecular markings within the left breast related to development of diffuse subcutaneous edema.  There is a small group of calcifications located inferiorly and slightly medially within the left breast. There are no worrisome linear or branching forms. These span 2 mm. These are felt to represent probably benign calcifications. I recommend followup diagnostic mammography at 6, 12, and 24 months.  Mammographic images were  processed with CAD.  On physical exam, there are edematous changes within the lower extremities and there is diffuse subcutaneous edema. There is no palpable mass within the left breast.  IMPRESSION: Increased trabecular markings within the left breast related to subcutaneous edematous changes as discussed above.  Small (2 mm) group of probably benign calcifications located slightly medially and inferiorly within the left breast. Recommend left breast diagnostic mammogram in 6 months.  RECOMMENDATION: Left breast diagnostic mammogram in 6 months.  I have discussed the findings and recommendations with the patient. Results were also provided in writing at the conclusion of the visit. If applicable, a reminder letter will be sent to the patient regarding  the next appointment.  BI-RADS CATEGORY  3: Probably benign.   Electronically Signed   By: Luberta Robertson M.D.   On: 09/19/2013 14:52   Microbiology: Recent Results (from the past 240 hour(s))  MRSA PCR SCREENING     Status: None   Collection Time    10/04/13  9:12 PM      Result Value Ref Range Status   MRSA by PCR NEGATIVE  NEGATIVE Final   Comment:            The GeneXpert MRSA Assay (FDA     approved for NASAL specimens     only), is one component of a     comprehensive MRSA colonization     surveillance program. It is not     intended to diagnose MRSA     infection nor to guide or     monitor treatment for     MRSA infections.    Labs: Basic Metabolic Panel:  Recent Labs Lab 10/07/13 0339 10/08/13 0340 10/09/13 0310 10/10/13 0450 10/12/13 0520  NA 138 142 141 139 140  K 4.4 4.2 3.9 4.2 3.5*  CL 93* 96 91* 91* 88*  CO2 35* 37* 42* 41* 42*  GLUCOSE 83 87 98 99 101*  BUN 40* 30* 21 16 24*  CREATININE 1.30* 1.05 0.98 0.89 1.02  CALCIUM 9.8 9.7 10.0 9.7 9.8   CBC:  Recent Labs Lab 10/06/13 0318 10/07/13 0339 10/09/13 0310 10/10/13 0450  WBC 7.3 9.0 9.6 8.5  HGB 10.7* 10.9* 11.3* 11.4*  HCT 39.0 38.7 41.0 41.6  MCV  68.4* 69.4* 70.2* 70.9*  PLT 179 162 156 149*   Cardiac Enzymes: No results found for this basename: CKTOTAL, CKMB, CKMBINDEX, TROPONINI,  in the last 168 hours BNP: BNP (last 3 results)  Recent Labs  10/07/13 0339 10/08/13 0340 10/09/13 0310  PROBNP 1411.0* 1089.0* 1017.0*   CBG:  Recent Labs Lab 10/10/13 2122 10/11/13 0833 10/11/13 1238 10/11/13 1640 10/11/13 2200  GLUCAP 172* 124* 169* 144* 246*   Signed:  Romy Ipock T ANP [facility] 10/12/2013, 7:19 PM  I have personally examined this patient and reviewed the entire database. I have reviewed the above note, made any necessary editorial changes, and agree with its content.  Cherene Altes, MD [facility]

## 2013-10-12 NOTE — Progress Notes (Signed)
PULMONARY / CRITICAL CARE MEDICINE   Name: Anna Clay MRN: [id number] DOB: [birth year]    ADMISSION DATE:  10/04/2013 CONSULTATION DATE:  10/04/2013  REFERRING MD :  EDP  CHIEF COMPLAINT:  Dyspnea  INITIAL PRESENTATION:  63 yo female smoker presented with progressive dyspnea, hypoxia from pulmonary edema and pulmonary hypertension.  She has hx of OSA s/p UPPP, diastolic heart failure, stage III CKD.  STUDIES:  11/12/07 PSG >> AHI 45.2, SaO2 low 69% 10/04/13 BLE Doppler >>> negative 10/04/13 VQ Scan >>> low prob for PE 10/06/13 Autoimmune panel >>> CRP mildly elevated at 1.3, ESR neg, dsDNA neg, ACE neg, RF neg, ANA neg.  SIGNIFICANT EVENTS: 9/15 admitted  9/17 Cardiology consulted  SUBJECTIVE:  Breathing much better.  She has occasional cough with clear sputum >> only present for past few days.  She denies wheeze or cough otherwise >> no hx of COPD/asthma.  Does not recall having PFT's before.  Slept okay with CPAP.  VITAL SIGNS: Temp:  [98.7 F (37.1 C)-99.4 F (37.4 C)] 98.8 F (37.1 C) (09/23 0430) Pulse Rate:  [59-109] 59 (09/23 0430) Resp:  [18] 18 (09/23 0430) BP: (95-143)/(47-81) 95/47 mmHg (09/23 0430) SpO2:  [100 %] 100 % (09/23 0430) Weight:  [257 lb 11.5 oz (116.9 kg)] 257 lb 11.5 oz (116.9 kg) (09/23 0430) INTAKE / OUTPUT:  Intake/Output Summary (Last 24 hours) at 10/12/13 1037 Last data filed at 10/12/13 0836  Gross per 24 hour  Intake    823 ml  Output   1300 ml  Net   -477 ml    PHYSICAL EXAMINATION: General:  Morbidly obese female, in NAD, sitting up in recliner.  Neuro:  A&O x 3, MAE's. HEENT:  [place]/AT, PERRL, 4L [place]. Cardiovascular:  RRR, no MRG Lungs:  CTA anteriorlly, no W/R/R. Abdomen:  Obese, soft, non-tender Musculoskeletal:  No acute deformity, mild edema LE's. Skin:  Intact, MMM  LABS:  CBC  Recent Labs Lab 10/07/13 0339 10/09/13 0310 10/10/13 0450  WBC 9.0 9.6 8.5  HGB 10.9* 11.3* 11.4*  HCT 38.7 41.0 41.6  PLT 162 156 149*    BMET  Recent Labs Lab 10/09/13 0310 10/10/13 0450 10/12/13 0520  NA 141 139 140  K 3.9 4.2 3.5*  CL 91* 91* 88*  CO2 42* 41* 42*  BUN 21 16 24*  CREATININE 0.98 0.89 1.02  GLUCOSE 98 99 101*   Electrolytes  Recent Labs Lab 10/09/13 0310 10/10/13 0450 10/12/13 0520  CALCIUM 10.0 9.7 9.8   Cardiac Enzymes  Recent Labs Lab 10/05/13 1352 10/05/13 1914 10/07/13 0339 10/08/13 0340 10/09/13 0310  TROPONINI <0.30 <0.30  --   --   --   PROBNP  --   --  1411.0* 1089.0* 1017.0*   Glucose  Recent Labs Lab 10/10/13 1143 10/10/13 2122 10/11/13 0833 10/11/13 1238 10/11/13 1640 10/11/13 2200  GLUCAP 148* 172* 124* 169* 144* 246*    Imaging No results found.   ASSESSMENT / PLAN:  Acute hypoxic respiratory failure 2nd to acute pulmonary edema, acute on chronic cor pulmonale.   Hx of OSA with presumed OHS. Tobacco abuse with concern for COPD. 2nd pulmonary hypertension >> OSA/OHS, COPD, diastolic heart failure. Plan: Negative fluid balance as tolerated  BP management per primary team and cardiology Oxygen to keep SaO2 88 to 94% >> will need to arrange for home oxygen Continue CPAP qhs for now >> will need outpt appointment scheduled to arrange for sleep study Smoking cessation Will needs PFT's as outpt Weight  loss  Agree with plan for d/c home.  She has been scheduled for pulmonary follow up follow up with Tammy Parrett on Tuesday, 10/18/13 at 3 pm, and Dr. Elsworth Soho on 11/30/13 at 3 pm.  Chesley Mires, MD [place] 10/12/2013, 11:09 AM Pager:  [phone] After 3pm call: [phone]

## 2013-10-12 NOTE — Discharge Instructions (Signed)
Spironolactone tablets What is this medicine? SPIRONOLACTONE ([place] LAK tone) is a diuretic. It helps you make more urine and to lose excess water from your body. This medicine is used to treat high blood pressure, and edema or swelling from heart, kidney, or liver disease. It is also used to treat patients who make too much aldosterone or have low potassium. This medicine may be used for other purposes; ask your health care provider or pharmacist if you have questions. COMMON BRAND NAME(S): Aldactone What should I tell my health care provider before I take this medicine? They need to know if you have any of these conditions: -high blood level of potassium -kidney disease or trouble making urine -liver disease -an unusual or allergic reaction to spironolactone, other medicines, foods, dyes, or preservatives -pregnant or trying to get pregnant -breast-feeding How should I use this medicine? Take this medicine by mouth with a drink of water. Follow the directions on your prescription label. You can take it with or without food. If it upsets your stomach, take it with food. Do not take your medicine more often than directed. Remember that you will need to pass more urine after taking this medicine. Do not take your doses at a time of day that will cause you problems. Do not take at bedtime. Talk to your pediatrician regarding the use of this medicine in children. While this drug may be prescribed for selected conditions, precautions do apply. Overdosage: If you think you have taken too much of this medicine contact a poison control center or emergency room at once. NOTE: This medicine is only for you. Do not share this medicine with others. What if I miss a dose? If you miss a dose, take it as soon as you can. If it is almost time for your next dose, take only that dose. Do not take double or extra doses. What may interact with this medicine? Do not take this medicine with any of the  following medications: -eplerenone This medicine may also interact with the following medications: -corticosteroids -digoxin -lithium -medicines for high blood pressure like ACE inhibitors -skeletal muscle relaxants like tubocurarine -NSAIDs, medicines for pain and inflammation, like ibuprofen or naproxen -potassium products like salt substitute or supplements -pressor amines like norepinephrine -some diuretics This list may not describe all possible interactions. Give your health care provider a list of all the medicines, herbs, non-prescription drugs, or dietary supplements you use. Also tell them if you smoke, drink alcohol, or use illegal drugs. Some items may interact with your medicine. What should I watch for while using this medicine? Visit your doctor or health care professional for regular checks on your progress. Check your blood pressure as directed. Ask your doctor what your blood pressure should be, and when you should contact them. You may need to be on a special diet while taking this medicine. Ask your doctor. Also, ask how many glasses of fluid you need to drink a day. You must not get dehydrated. This medicine may make you feel confused, dizzy or lightheaded. Drinking alcohol and taking some medicines can make this worse. Do not drive, use machinery, or do anything that needs mental alertness until you know how this medicine affects you. Do not sit or stand up quickly. What side effects may I notice from receiving this medicine? Side effects that you should report to your doctor or health care professional as soon as possible: -allergic reactions such as skin rash or itching, hives, swelling of the  lips, mouth, tongue, or throat -black or tarry stools -fast, irregular heartbeat -fever -muscle pain, cramps -numbness, tingling in hands or feet -trouble breathing -trouble passing urine -unusual bleeding -unusually weak or tired Side effects that usually do not require  medical attention (report to your doctor or health care professional if they continue or are bothersome): -change in voice or hair growth -confusion -dizzy, drowsy -dry mouth, increased thirst -enlarged or tender breasts -headache -irregular menstrual periods -sexual difficulty, unable to have an erection -stomach upset This list may not describe all possible side effects. Call your doctor for medical advice about side effects. You may report side effects to FDA at 1-800-FDA-1088. Where should I keep my medicine? Keep out of the reach of children. Store below 25 degrees C (77 degrees F). Throw away any unused medicine after the expiration date. NOTE: This sheet is a summary. It may not cover all possible information. If you have questions about this medicine, talk to your doctor, pharmacist, or health care provider.  2015, [employer]. (2009-09-18 12:51:30) Daily Weight Record It is important to weigh yourself daily. Keep this daily weight chart near your scale. Weigh yourself each morning at the same time. Weigh yourself without shoes and with the same amount of clothes each day. Compare today's weight to yesterday's weight. Bring this form with you to your follow-up appointments. Call your caregiver if you gain 03 lb/1.4 kg in 1 day. Call your caregiver if you gain 05 lb/2.3 kg in a week. Date: ________ Weight: ____________________ Date: ________ Weight: ____________________ Date: ________ Weight: ____________________ Date: ________ Weight: ____________________ Date: ________ Weight: ____________________ Date: ________ Weight: ____________________ Date: ________ Weight: ____________________ Date: ________ Weight: ____________________ Date: ________ Weight: ____________________ Date: ________ Weight: ____________________ Date: ________ Weight: ____________________ Date: ________ Weight: ____________________ Date: ________ Weight: ____________________ Date: ________ Weight:  ____________________ Date: ________ Weight: ____________________ Date: ________ Weight: ____________________ Date: ________ Weight: ____________________ Date: ________ Weight: ____________________ Date: ________ Weight: ____________________ Date: ________ Weight: ____________________ Date: ________ Weight: ____________________ Date: ________ Weight: ____________________ Date: ________ Weight: ____________________ Date: ________ Weight: ____________________ Date: ________ Weight: ____________________ Date: ________ Weight: ____________________ Date: ________ Weight: ____________________ Date: ________ Weight: ____________________ Date: ________ Weight: ____________________ Date: ________ Weight: ____________________ Date: ________ Weight: ____________________ Date: ________ Weight: ____________________ Date: ________ Weight: ____________________ Date: ________ Weight: ____________________ Date: ________ Weight: ____________________ Date: ________ Weight: ____________________ Date: ________ Weight: ____________________ Date: ________ Weight: ____________________ Date: ________ Weight: ____________________ Date: ________ Weight: ____________________ Date: ________ Weight: ____________________ Date: ________ Weight: ____________________ Date: ________ Weight: ____________________ Date: ________ Weight: ____________________ Date: ________ Weight: ____________________ Date: ________ Weight: ____________________ Date: ________ Weight: ____________________ Date: ________ Weight: ____________________ Date: ________ Weight: ____________________ Date: ________ Weight: ____________________ Document Released: 03/20/2006 Document Revised: 03/31/2011 Document Reviewed: 12/25/2006 [employer] Patient Information 2015 [employer]. This information is not intended to replace advice given to you by your health care provider. Make sure you discuss any questions you have with your health care provider. Heart  Failure Heart failure is a condition in which the heart has trouble pumping blood. This means your heart does not pump blood efficiently for your body to work well. In some cases of heart failure, fluid may back up into your lungs or you may have swelling (edema) in your lower legs. Heart failure is usually a long-term (chronic) condition. It is important for you to take good care of yourself and follow your health care provider's treatment plan. CAUSES  Some health conditions can cause heart failure. Those health conditions include:  High blood pressure (  hypertension). Hypertension causes the heart muscle to work harder than normal. When pressure in the blood vessels is high, the heart needs to pump (contract) with more force in order to circulate blood throughout the body. High blood pressure eventually causes the heart to become stiff and weak.  Coronary artery disease (CAD). CAD is the buildup of cholesterol and fat (plaque) in the arteries of the heart. The blockage in the arteries deprives the heart muscle of oxygen and blood. This can cause chest pain and may lead to a heart attack. High blood pressure can also contribute to CAD.  Heart attack (myocardial infarction). A heart attack occurs when one or more arteries in the heart become blocked. The loss of oxygen damages the muscle tissue of the heart. When this happens, part of the heart muscle dies. The injured tissue does not contract as well and weakens the heart's ability to pump blood.  Abnormal heart valves. When the heart valves do not open and close properly, it can cause heart failure. This makes the heart muscle pump harder to keep the blood flowing.  Heart muscle disease (cardiomyopathy or myocarditis). Heart muscle disease is damage to the heart muscle from a variety of causes. These can include drug or alcohol abuse, infections, or unknown reasons. These can increase the risk of heart failure.  Lung disease. Lung disease makes the  heart work harder because the lungs do not work properly. This can cause a strain on the heart, leading it to fail.  Diabetes. Diabetes increases the risk of heart failure. High blood sugar contributes to high fat (lipid) levels in the blood. Diabetes can also cause slow damage to tiny blood vessels that carry important nutrients to the heart muscle. When the heart does not get enough oxygen and food, it can cause the heart to become weak and stiff. This leads to a heart that does not contract efficiently.  Other conditions can contribute to heart failure. These include abnormal heart rhythms, thyroid problems, and low blood counts (anemia). Certain unhealthy behaviors can increase the risk of heart failure, including:  Being overweight.  Smoking or chewing tobacco.  Eating foods high in fat and cholesterol.  Abusing illicit drugs or alcohol.  Lacking physical activity. SYMPTOMS  Heart failure symptoms may vary and can be hard to detect. Symptoms may include:  Shortness of breath with activity, such as climbing stairs.  Persistent cough.  Swelling of the feet, ankles, legs, or abdomen.  Unexplained weight gain.  Difficulty breathing when lying flat (orthopnea).  Waking from sleep because of the need to sit up and get more air.  Rapid heartbeat.  Fatigue and loss of energy.  Feeling light-headed, dizzy, or close to fainting.  Loss of appetite.  Nausea.  Increased urination during the night (nocturia). DIAGNOSIS  A diagnosis of heart failure is based on your history, symptoms, physical examination, and diagnostic tests. Diagnostic tests for heart failure may include:  Echocardiography.  Electrocardiography.  Chest X-ray.  Blood tests.  Exercise stress test.  Cardiac angiography.  Radionuclide scans. TREATMENT  Treatment is aimed at managing the symptoms of heart failure. Medicines, behavioral changes, or surgical intervention may be necessary to treat heart  failure.  Medicines to help treat heart failure may include:  Angiotensin-converting enzyme (ACE) inhibitors. This type of medicine blocks the effects of a blood protein called angiotensin-converting enzyme. ACE inhibitors relax (dilate) the blood vessels and help lower blood pressure.  Angiotensin receptor blockers (ARBs). This type of medicine blocks the actions  of a blood protein called angiotensin. Angiotensin receptor blockers dilate the blood vessels and help lower blood pressure.  Water pills (diuretics). Diuretics cause the kidneys to remove salt and water from the blood. The extra fluid is removed through urination. This loss of extra fluid lowers the volume of blood the heart pumps.  Beta blockers. These prevent the heart from beating too fast and improve heart muscle strength.  Digitalis. This increases the force of the heartbeat.  Healthy behavior changes include:  Obtaining and maintaining a healthy weight.  Stopping smoking or chewing tobacco.  Eating heart-healthy foods.  Limiting or avoiding alcohol.  Stopping illicit drug use.  Physical activity as directed by your health care provider.  Surgical treatment for heart failure may include:  A procedure to open blocked arteries, repair damaged heart valves, or remove damaged heart muscle tissue.  A pacemaker to improve heart muscle function and control certain abnormal heart rhythms.  An internal cardioverter defibrillator to treat certain serious abnormal heart rhythms.  A left ventricular assist device (LVAD) to assist the pumping ability of the heart. HOME CARE INSTRUCTIONS   Take medicines only as directed by your health care provider. Medicines are important in reducing the workload of your heart, slowing the progression of heart failure, and improving your symptoms.  Do not stop taking your medicine unless directed by your health care provider.  Do not skip any dose of medicine.  Refill your  prescriptions before you run out of medicine. Your medicines are needed every day.  Engage in moderate physical activity if directed by your health care provider. Moderate physical activity can benefit some people. The elderly and people with severe heart failure should consult with a health care provider for physical activity recommendations.  Eat heart-healthy foods. Food choices should be free of trans fat and low in saturated fat, cholesterol, and salt (sodium). Healthy choices include fresh or frozen fruits and vegetables, fish, lean meats, legumes, fat-free or low-fat dairy products, and whole grain or high fiber foods. Talk to a dietitian to learn more about heart-healthy foods.  Limit sodium if directed by your health care provider. Sodium restriction may reduce symptoms of heart failure in some people. Talk to a dietitian to learn more about heart-healthy seasonings.  Use healthy cooking methods. Healthy cooking methods include roasting, grilling, broiling, baking, poaching, steaming, or stir-frying. Talk to a dietitian to learn more about healthy cooking methods.  Limit fluids if directed by your health care provider. Fluid restriction may reduce symptoms of heart failure in some people.  Weigh yourself every day. Daily weights are important in the early recognition of excess fluid. You should weigh yourself every morning after you urinate and before you eat breakfast. Wear the same amount of clothing each time you weigh yourself. Record your daily weight. Provide your health care provider with your weight record.  Monitor and record your blood pressure if directed by your health care provider.  Check your pulse if directed by your health care provider.  Lose weight if directed by your health care provider. Weight loss may reduce symptoms of heart failure in some people.  Stop smoking or chewing tobacco. Nicotine makes your heart work harder by causing your blood vessels to constrict.  Do not use nicotine gum or patches before talking to your health care provider.  Keep all follow-up visits as directed by your health care provider. This is important.  Limit alcohol intake to no more than 1 drink per day for nonpregnant women  and 2 drinks per day for men. One drink equals 12 ounces of beer, 5 ounces of wine, or 1 ounces of hard liquor. Drinking more than that is harmful to your heart. Tell your health care provider if you drink alcohol several times a week. Talk with your health care provider about whether alcohol is safe for you. If your heart has already been damaged by alcohol or you have severe heart failure, drinking alcohol should be stopped completely.  Stop illicit drug use.  Stay up-to-date with immunizations. It is especially important to prevent respiratory infections through current pneumococcal and influenza immunizations.  Manage other health conditions such as hypertension, diabetes, thyroid disease, or abnormal heart rhythms as directed by your health care provider.  Learn to manage stress.  Plan rest periods when fatigued.  Learn strategies to manage high temperatures. If the weather is extremely hot:  Avoid vigorous physical activity.  Use air conditioning or fans or seek a cooler location.  Avoid caffeine and alcohol.  Wear loose-fitting, lightweight, and light-colored clothing.  Learn strategies to manage cold temperatures. If the weather is extremely cold:  Avoid vigorous physical activity.  Layer clothes.  Wear mittens or gloves, a hat, and a scarf when going outside.  Avoid alcohol.  Obtain ongoing education and support as needed.  Participate in or seek rehabilitation as needed to maintain or improve independence and quality of life. SEEK MEDICAL CARE IF:   Your weight increases by 03 lb/1.4 kg in 1 day or 05 lb/2.3 kg in a week.  You have increasing shortness of breath that is unusual for you.  You are unable to participate in  your usual physical activities.  You tire easily.  You cough more than normal, especially with physical activity.  You have any or more swelling in areas such as your hands, feet, ankles, or abdomen.  You are unable to sleep because it is hard to breathe.  You feel like your heart is beating fast (palpitations).  You become dizzy or light-headed upon standing up. SEEK IMMEDIATE MEDICAL CARE IF:   You have difficulty breathing.  There is a change in mental status such as decreased alertness or difficulty with concentration.  You have a pain or discomfort in your chest.  You have an episode of fainting (syncope). MAKE SURE YOU:   Understand these instructions.  Will watch your condition.  Will get help right away if you are not doing well or get worse. Document Released: 01/06/2005 Document Revised: 05/23/2013 Document Reviewed: 02/06/2012 [facility] Patient Information 2015 [place], [place]. This information is not intended to replace advice given to you by your health care provider. Make sure you discuss any questions you have with your health care provider. Metoprolol extended-release tablets What is this medicine? METOPROLOL (me TOE proe lole) is a beta-blocker. Beta-blockers reduce the workload on the heart and help it to beat more regularly. This medicine is used to treat high blood pressure and to prevent chest pain. It is also used to after a heart attack and to prevent an additional heart attack from occurring. This medicine may be used for other purposes; ask your health care provider or pharmacist if you have questions. COMMON BRAND NAME(S): Toprol XL What should I tell my health care provider before I take this medicine? They need to know if you have any of these conditions: -diabetes -heart or vessel disease like slow heart rate, worsening heart failure, heart block, sick sinus syndrome or Raynaud's disease -kidney disease -liver disease -lung  or breathing disease,  like asthma or emphysema -pheochromocytoma -thyroid disease -an unusual or allergic reaction to metoprolol, other beta-blockers, medicines, foods, dyes, or preservatives -pregnant or trying to get pregnant -breast-feeding How should I use this medicine? Take this medicine by mouth with a glass of water. Follow the directions on the prescription label. Do not crush or chew. Take this medicine with or immediately after meals. Take your doses at regular intervals. Do not take more medicine than directed. Do not stop taking this medicine suddenly. This could lead to serious heart-related effects. Talk to your pediatrician regarding the use of this medicine in children. While this drug may be prescribed for children as young as 6 years for selected conditions, precautions do apply. Overdosage: If you think you have taken too much of this medicine contact a poison control center or emergency room at once. NOTE: This medicine is only for you. Do not share this medicine with others. What if I miss a dose? If you miss a dose, take it as soon as you can. If it is almost time for your next dose, take only that dose. Do not take double or extra doses. What may interact with this medicine? This medicine may interact with the following medications: -certain medicines for blood pressure, heart disease, irregular heart beat -certain medicines for depression, like monoamine oxidase (MAO) inhibitors, fluoxetine, or paroxetine -clonidine -dobutamine -epinephrine -isoproterenol -reserpine This list may not describe all possible interactions. Give your health care provider a list of all the medicines, herbs, non-prescription drugs, or dietary supplements you use. Also tell them if you smoke, drink alcohol, or use illegal drugs. Some items may interact with your medicine. What should I watch for while using this medicine? Visit your doctor or health care professional for regular check ups. Contact your doctor  right away if your symptoms worsen. Check your blood pressure and pulse rate regularly. Ask your health care professional what your blood pressure and pulse rate should be, and when you should contact them. You may get drowsy or dizzy. Do not drive, use machinery, or do anything that needs mental alertness until you know how this medicine affects you. Do not sit or stand up quickly, especially if you are an older patient. This reduces the risk of dizzy or fainting spells. Contact your doctor if these symptoms continue. Alcohol may interfere with the effect of this medicine. Avoid alcoholic drinks. What side effects may I notice from receiving this medicine? Side effects that you should report to your doctor or health care professional as soon as possible: -allergic reactions like skin rash, itching or hives -cold or numb hands or feet -depression -difficulty breathing -faint -fever with sore throat -irregular heartbeat, chest pain -rapid weight gain -swollen legs or ankles Side effects that usually do not require medical attention (report to your doctor or health care professional if they continue or are bothersome): -anxiety or nervousness -change in sex drive or performance -dry skin -headache -nightmares or trouble sleeping -short term memory loss -stomach upset or diarrhea -unusually tired This list may not describe all possible side effects. Call your doctor for medical advice about side effects. You may report side effects to FDA at 1-800-FDA-1088. Where should I keep my medicine? Keep out of the reach of children. Store at room temperature between 15 and 30 degrees C (59 and 86 degrees F). Throw away any unused medicine after the expiration date. NOTE: This sheet is a summary. It may not cover all possible information. If  you have questions about this medicine, talk to your doctor, pharmacist, or health care provider.  2015, [employer]. (2012-09-10  14:41:37) Hydralazine injection What is this medicine? HYDRALAZINE (hye DRAL a zeen) is a vasodilator. It relaxes blood vessels, increasing the blood and oxygen supply to your heart. This medicine is used to treat high blood pressure. This medicine may be used for other purposes; ask your health care provider or pharmacist if you have questions. What should I tell my health care provider before I take this medicine? They need to know if you have any of these conditions: -blood vessel disease -heart disease including angina or history of heart attack -kidney disease -liver disease -systemic lupus erythematosus (SLE) -an unusual or allergic reaction to hydralazine, tartrazine dye, other medicines, foods, dyes, or preservatives -pregnant or trying to get pregnant -breast-feeding How should I use this medicine? This medicine is for injection into a muscle or a vein, It is given by a health-care professional in a hospital or clinic setting. Talk to your pediatrician regarding the use of this medicine in children. Special care may be needed. While this drug may be prescribed for children for selected conditions, precautions do apply. Overdosage: If you think you have taken too much of this medicine contact a poison control center or emergency room at once. NOTE: This medicine is only for you. Do not share this medicine with others. What if I miss a dose? This does not apply. What may interact with this medicine? -medicines for high blood pressure -medicines for depression This list may not describe all possible interactions. Give your health care provider a list of all the medicines, herbs, non-prescription drugs, or dietary supplements you use. Also tell them if you smoke, drink alcohol, or use illegal drugs. Some items may interact with your medicine. What should I watch for while using this medicine? You may get drowsy or dizzy. Do not drive, use machinery, or do anything that needs mental  alertness until you know how this medicine affects you. Do not stand or sit up quickly, especially if you are an older patient. This reduces the risk of dizzy or fainting spells. Alcohol may interfere with the effect of this medicine. Avoid alcoholic drinks. Do not treat yourself for coughs, colds, or pain while you are taking this medicine without asking your doctor or health care professional for advice. Some ingredients may increase your blood pressure. What side effects may I notice from receiving this medicine? Side effects that you should report to your doctor or health care professional as soon as possible: -allergic reactions like skin rash, itching or hives, swelling of the face, lips, or tongue -breathing problems -chest pain -fast, irregular heartbeat -fever, chills, or sore throat -pain, tingling, numbness in the hands or feet -stiff or swollen joints -sudden weight gain -swelling of the feet or legs -swollen lymph glands -unusually weak or tired Side effects that usually do not require medical attention (report to your doctor or health care professional if they continue or are bothersome): -diarrhea or constipation -headache -loss of appetite -nausea, vomiting This list may not describe all possible side effects. Call your doctor for medical advice about side effects. You may report side effects to FDA at 1-800-FDA-1088. Where should I keep my medicine? This drug is given in a hospital or clinic and will not be stored at home. NOTE: This sheet is a summary. It may not cover all possible information. If you have questions about this medicine, talk to your doctor,  pharmacist, or health care provider.  2015, Elsevier/Gold Standard. (2007-05-21 15:46:14) Torsemide injection What is this medicine? TORSEMIDE (TORE se mide) is a diuretic. It helps you make more urine and to lose salt and excess water from your body. This medicine is used to treat high blood pressure, and edema or  swelling from heart, kidney, or liver disease. This medicine may be used for other purposes; ask your health care provider or pharmacist if you have questions. COMMON BRAND NAME(S): Demadex What should I tell my health care provider before I take this medicine? They need to know if you have any of these conditions: -diabetes -gout -heart disease -kidney disease -liver disease -small amount of urine, or difficulty passing urine -an unusual or allergic reaction to torsemide, sulfa drugs, other medicines, foods, dyes, or preservatives -pregnant or trying to get pregnant -breast-feeding How should I use this medicine? This medicine is for injection into a vein. It is given by a health care professional in a hospital or clinic setting. Talk to your pediatrician regarding the use of this medicine in children. Special care may be needed. Overdosage: If you think you have taken too much of this medicine contact a poison control center or emergency room at once. NOTE: This medicine is only for you. Do not share this medicine with others. What if I miss a dose? This does not apply. What may interact with this medicine? -alcohol -certain antibiotics given by injection certain heart medicines like digoxin -diuretics -lithium -medicines for diabetes -medicines for blood pressure -medicines for cholesterol like cholestyramine -medicines that relax muscles for surgery -NSAIDs, medicines for pain and inflammation, like ibuprofen or naproxen -OTC supplements like ginseng and ephedra -probenecid -steroid medicines like prednisone or cortisone This list may not describe all possible interactions. Give your health care provider a list of all the medicines, herbs, non-prescription drugs, or dietary supplements you use. Also tell them if you smoke, drink alcohol, or use illegal drugs. Some items may interact with your medicine. What should I watch for while using this medicine? Your condition will be  monitored carefully while you are receiving this medicine. You may need to be a on special diet while you are taking this medicine. Ask your doctor. Also, ask how many glasses of fluids you need to drink each day. You must not get dehydrated. You may get drowsy or dizzy. Do not drive, use machinery, or do anything that needs mental alertness until you know how this drug affects you. Do not stand or sit up quickly, especially if you are an older patient. This reduces the risk of dizzy or fainting spells. Alcohol can make you more drowsy and dizzy. Avoid alcoholic drinks. What side effects may I notice from receiving this medicine? Side effects that you should report to your doctor or health care professional as soon as possible: -allergic reactions such as skin rash or itching, hives, swelling of the lips, mouth, tongue, or throat -blood in urine or stools -dry mouth -fever or chills -hearing loss or ringing in the ears -irregular heartbeat -muscle pain, weakness or cramps -unusually weak or tired -vomiting or diarrhea Side effects that usually do not require medical attention (report to your doctor or health care professional if they continue or are bothersome): -dizzy or lightheaded -headache -increased thirst -sexual difficulties -stomach pain, upset or nausea This list may not describe all possible side effects. Call your doctor for medical advice about side effects. You may report side effects to FDA at 1-800-FDA-1088. Where should  I keep my medicine? This drug is given in a hospital or clinic and will not be stored at home. NOTE: This sheet is a summary. It may not cover all possible information. If you have questions about this medicine, talk to your doctor, pharmacist, or health care provider.  2015, Elsevier/Gold Standard. (2007-09-23 11:36:42) Polysomnography (Sleep Studies) Polysomnography (PSG) is a series of tests used for detecting (diagnosing) obstructive sleep apnea and  other sleep disorders. The tests measure how some parts of your body are working while you are sleeping. The tests are extensive and expensive. They are done in a sleep lab or hospital, and vary from center to center. Your caregiver may perform other more simple sleep studies and questionnaires before doing more complete and involved testing. Testing may not be covered by insurance. Some of these tests are:  An EEG (Electroencephalogram). This tests your brain waves and stages of sleep.  An EOG (Electrooculogram). This measures the movements of your eyes. It detects periods of REM (rapid eye movement) sleep, which is your dream sleep.  An EKG (Electrocardiogram). This measures your heart rhythm.  EMG (Electromyography). This is a measurement of how the muscles are working in your upper airway and your legs while sleeping.  An oximetry measurement. It measures how much oxygen (air) you are getting while sleeping.  Breathing efforts may be measured. The same test can be interpreted (understood) differently by different caregivers and centers that study sleep.  Studies may be given an apnea/hypopnea index (AHI). This is a number which is found by counting the times of no breathing or under breathing during the night, and relating those numbers to the amount of time spent in bed. When the AHI is greater than 15, the patient is likely to complain of daytime sleepiness. When the AHI is greater than 30, the patient is at increased risk for heart problems and must be followed more closely. Following the AHI also allows you to know how treatment is working. Simple oximetry (tracking the amount of oxygen that is taken in) can be used for screening patients who:  Do not have symptoms (problems) of OSA.  Have a normal Epworth Sleepiness Scale Score.  Have a low pre-test probability of having OSA.  Have none of the upper airway problems likely to cause apnea.  Oximetry is also used to determine if  treatment is effective in patients who showed significant desaturations (not getting enough oxygen) on their home sleep study. One extra measure of safety is to perform additional studies for the person who only snores. This is because no one can predict with absolute certainty who will have OSA. Those who show significant desaturations (not getting enough oxygen) are recommended to have a more detailed sleep study. Document Released: 07/13/2002 Document Revised: 03/31/2011 Document Reviewed: 03/14/2013 [facility] Patient Information 2015 [place], [place]. This information is not intended to replace advice given to you by your health care provider. Make sure you discuss any questions you have with your health care provider. CPAP and BIPAP Information CPAP and BIPAP are methods of helping you breathe with the use of air pressure. CPAP stands for "continuous positive airway pressure." BIPAP stands for "bi-level positive airway pressure." In both methods, air is blown into your air passages to help keep you breathing well. With CPAP, the amount of pressure stays the same while you breathe in and out. CPAP is most commonly used for obstructive sleep apnea. For obstructive sleep apnea, CPAP works by holding your airways open so that  they do not collapse when your muscles relax during sleep. BIPAP is similar to CPAP except the amount of pressure is increased when you inhale. This helps you take larger breaths. Your health care provider will recommend whether CPAP or BIPAP would be more helpful for you.  WHY ARE CPAP AND BIPAP TREATMENTS USED? CPAP or BIPAP can be helpful if you have:   Sleep apnea.   Chronic obstructive pulmonary disease (COPD).   Diseases that weaken the muscles of the chest, including muscular dystrophy or neurological diseases such as amyotrophic lateral sclerosis (ALS).  Other problems that cause breathing to be weak, abnormal, or difficult.  HOW IS CPAP OR BIPAP ADMINISTERED? Both  CPAP and BIPAP are provided by a small machine with a flexible plastic tube that attaches to a plastic mask. The mask fits on your face, and air is blown into your air passages through your nose or mouth. The amount of pressure that is used to blow the air into your air passages can be set on the machine. Your health care provider will determine the pressure setting that should be used based on your individual needs. WHEN SHOULD CPAP OR BIPAP BE USED? In most cases, the mask is worn only when sleeping. Generally, you will need to wear the mask throughout the night and during the daytime if you take a nap. In a few cases involving certain medical conditions, people also need to wear the mask at other times when they are awake. Follow your health care provider's instructions for when to use the machine.  USING THE MASK  Because the mask needs to be snug, some people feel a trapped or closed-in feeling (claustrophobic) when first using the mask. You may need to get used to the mask gradually. To do this, you can first hold the mask loosely over your nose or mouth. Gradually apply the mask more snugly. You can also gradually increase the amount of time that you use the mask.  Masks are available in various types and sizes. Some fit over your mouth and nose, and some fit over just your nose. If your mask does not fit well, talk to your health care provider about getting a different one.  If you are using a nasal mask and you tend to breathe through your mouth, a chin strap may be applied to help keep your mouth closed.   The CPAP and BIPAP machines have alarms that may sound if the mask comes off or develops a leak.  If you have trouble with the mask, it is very important that you talk to your health care provider about finding a way to make the mask easier to tolerate. Do not stop using the mask. This could have a negative impact on your health. TIPS FOR USING THE MACHINE  Place your CPAP or BIPAP  machine on a secure table or stand near an electrical outlet.   Know where the on-off switch is located on the machine.  Follow your health care provider's instructions for how to set the pressure on your machine and when you should use it.  Do not eat or drink while the CPAP or BIPAP machine is on. Food or fluids could get pushed into your lungs by the pressure of the CPAP or BIPAP.  Do not smoke. Tobacco smoke residue can damage the machine.   For home use, CPAP and BIPAP machines can be rented or purchased through home health care companies. Many different brands of machines are available. Renting  a machine before purchasing may help you find out which particular machine works well for you. SEEK IMMEDIATE MEDICAL CARE IF:  You have redness or open areas around your nose or mouth where the mask fits.   You have trouble operating the CPAP or BIPAP machine.   You cannot tolerate wearing the CPAP or BIPAP mask.  Document Released: 10/05/2003 Document Revised: 05/23/2013 Document Reviewed: 08/05/2012 [facility] Patient Information 2015 [place], [place]. This information is not intended to replace advice given to you by your health care provider. Make sure you discuss any questions you have with your health care provider. Sleep Apnea  Sleep apnea is a sleep disorder characterized by abnormal pauses in breathing while you sleep. When your breathing pauses, the level of oxygen in your blood decreases. This causes you to move out of deep sleep and into light sleep. As a result, your quality of sleep is poor, and the system that carries your blood throughout your body (cardiovascular system) experiences stress. If sleep apnea remains untreated, the following conditions can develop:  High blood pressure (hypertension).  Coronary artery disease.  Inability to achieve or maintain an erection (impotence).  Impairment of your thought process (cognitive dysfunction). There are three types of sleep  apnea:  Obstructive sleep apnea--Pauses in breathing during sleep because of a blocked airway.  Central sleep apnea--Pauses in breathing during sleep because the area of the brain that controls your breathing does not send the correct signals to the muscles that control breathing.  Mixed sleep apnea--A combination of both obstructive and central sleep apnea. RISK FACTORS The following risk factors can increase your risk of developing sleep apnea:  Being overweight.  Smoking.  Having narrow passages in your nose and throat.  Being of older age.  Being female.  Alcohol use.  Sedative and tranquilizer use.  Ethnicity. Among individuals younger than 35 years, African Americans are at increased risk of sleep apnea. SYMPTOMS   Difficulty staying asleep.  Daytime sleepiness and fatigue.  Loss of energy.  Irritability.  Loud, heavy snoring.  Morning headaches.  Trouble concentrating.  Forgetfulness.  Decreased interest in sex. DIAGNOSIS  In order to diagnose sleep apnea, your caregiver will perform a physical examination. Your caregiver may suggest that you take a home sleep test. Your caregiver may also recommend that you spend the night in a sleep lab. In the sleep lab, several monitors record information about your heart, lungs, and brain while you sleep. Your leg and arm movements and blood oxygen level are also recorded. TREATMENT The following actions may help to resolve mild sleep apnea:  Sleeping on your side.   Using a decongestant if you have nasal congestion.   Avoiding the use of depressants, including alcohol, sedatives, and narcotics.   Losing weight and modifying your diet if you are overweight. There also are devices and treatments to help open your airway:  Oral appliances. These are custom-made mouthpieces that shift your lower jaw forward and slightly open your bite. This opens your airway.  Devices that create positive airway pressure. This  positive pressure "splints" your airway open to help you breathe better during sleep. The following devices create positive airway pressure:  Continuous positive airway pressure (CPAP) device. The CPAP device creates a continuous level of air pressure with an air pump. The air is delivered to your airway through a mask while you sleep. This continuous pressure keeps your airway open.  Nasal expiratory positive airway pressure (EPAP) device. The EPAP device creates positive air  pressure as you exhale. The device consists of single-use valves, which are inserted into each nostril and held in place by adhesive. The valves create very little resistance when you inhale but create much more resistance when you exhale. That increased resistance creates the positive airway pressure. This positive pressure while you exhale keeps your airway open, making it easier to breath when you inhale again.  Bilevel positive airway pressure (BPAP) device. The BPAP device is used mainly in patients with central sleep apnea. This device is similar to the CPAP device because it also uses an air pump to deliver continuous air pressure through a mask. However, with the BPAP machine, the pressure is set at two different levels. The pressure when you exhale is lower than the pressure when you inhale.  Surgery. Typically, surgery is only done if you cannot comply with less invasive treatments or if the less invasive treatments do not improve your condition. Surgery involves removing excess tissue in your airway to create a wider passage way. Document Released: 12/27/2001 Document Revised: 05/03/2012 Document Reviewed: 05/15/2011 [facility] Patient Information 2015 [place], [place]. This information is not intended to replace advice given to you by your health care provider. Make sure you discuss any questions you have with your health care provider.

## 2013-10-12 NOTE — Progress Notes (Signed)
Pt a/o, no c/o pain, slept through the night, vss, pt stable

## 2013-10-12 NOTE — Progress Notes (Signed)
In at bedside for [facility] consult.  Patient was in the shower per her niece.  Left [facility] literature for patient's review.  Will attempt to follow up tomorrow.  Of note, [facility] Care Management services does not replace or interfere with any services that are arranged by inpatient case management or social work.  For additional questions or referrals please contact Corliss Blacker BSN RN [facility] Liaison at [phone].

## 2013-10-12 NOTE — Progress Notes (Signed)
Physical Therapy Treatment Patient Details Name: Anna Clay MRN: [id number] DOB: [birth year] Today's Date: 10/12/2013    History of Present Illness Anna Clay is a 63 y.o. female who presents to the ED with several week to month history of progressively worsening SOB.  Symptoms have been associated with peripheral edema    PT Comments    Pt limited by decreasing O2 sats this session. Pt was able to ambulate ~50 feet on RA, with sudden drop in sats towards end of gait training. Pt demonstrated good pursed-lip breathing techniques. See Gait Training Details section. Pt was educated about general safety and energy conservation for return home. Discussed possibility of toilet riser option vs. BSC due to space limitations in her bathroom at home.   SATURATION QUALIFICATIONS: (This note is used to comply with regulatory documentation for home oxygen)  Patient Saturations on Room Air at Rest = 83-85%  Patient Saturations on Room Air while Ambulating = 58-80%  Please briefly explain why patient needs home oxygen: Pt with ability to maintain O2 sats in mid 80's for brief period of time during mobility on RA. Drop in O2 sats is drastic with ambulation, and continues to drop even more after seated rest break.    Follow Up Recommendations  Home health PT;Supervision/Assistance - 24 hour [facility] aide)     Equipment Recommendations  Other (comment) (Toilet riser -pt concerned that [facility] will not fit over toilet)    Recommendations for Other Services       Precautions / Restrictions Precautions Precautions: Other (comment) (O2 sats drop with minimal exertion/conversing) Precaution Comments: Keep sats >85% Restrictions Weight Bearing Restrictions: No    Mobility  Bed Mobility               General bed mobility comments: Pt sitting in straight back chair in front of sink when PT entered.   Transfers Overall transfer level: Needs assistance Equipment used: Rolling walker (2  wheeled) Transfers: Sit to/from Stand Sit to Stand: Supervision         General transfer comment: Pt demonstrated safe hand placement.   Ambulation/Gait Ambulation/Gait assistance: Supervision Ambulation Distance (Feet): 50 Feet Assistive device: Rolling walker (2 wheeled) Gait Pattern/deviations: Step-through pattern;Decreased stride length;Trendelenburg Gait velocity: Decreased Gait velocity interpretation: Below normal speed for age/gender General Gait Details: Pt ambulated on RA per RN request to document O2 sats. Sats remained in low 80's for most of gait training with sudden drop in sats to low 70's. Upon seated rest break sats continued to drop with 58% being the lowest. Supplemental O2 was donned on highest level available and sats rose to 88%. Supplemental O2 then dropped back to 3L/min.    Stairs            Wheelchair Mobility    Modified Rankin (Stroke Patients Only)       Balance Overall balance assessment: No apparent balance deficits (not formally assessed)                                  Cognition Arousal/Alertness: Awake/alert Behavior During Therapy: WFL for tasks assessed/performed Overall Cognitive Status: Within Functional Limits for tasks assessed                      Exercises      General Comments        Pertinent Vitals/Pain Pain Assessment: No/denies pain    Home Living  Prior Function            PT Goals (current goals can now be found in the care plan section) Acute Rehab PT Goals Patient Stated Goal: Home today PT Goal Formulation: With patient Time For Goal Achievement: 10/24/13 Potential to Achieve Goals: Good Progress towards PT goals: Progressing toward goals    Frequency  Min 3X/week    PT Plan Current plan remains appropriate    Co-evaluation             End of Session Equipment Utilized During Treatment: Oxygen;Gait belt Activity Tolerance: Patient  limited by fatigue Patient left: in chair;with call bell/phone within reach     Time: 1148-1205 PT Time Calculation (min): 17 min  Charges:  $Gait Training: 8-22 mins                    G Codes:      Jolyn Lent 2013-10-25, 12:32 PM  Jolyn Lent, PT, DPT Acute Rehabilitation Services Pager: [phone]

## 2013-10-12 NOTE — Progress Notes (Signed)
Late entry from 10/11/2013 10am  Subjective:  Patient feels much improved with regard to shortness of breath.   No chest pain or palpitation. States she has now quit smoking! Objective:  Vital Signs in the last 24 hours: Temp:  [98.7 F (37.1 C)-99.4 F (37.4 C)] 98.8 F (37.1 C) (09/23 0430) Pulse Rate:  [59-109] 59 (09/23 0430) Resp:  [18] 18 (09/23 0430) BP: (95-143)/(47-81) 95/47 mmHg (09/23 0430) SpO2:  [100 %] 100 % (09/23 0430) Weight:  [116.9 kg (257 lb 11.5 oz)] 116.9 kg (257 lb 11.5 oz) (09/23 0430)  Intake/Output from previous day: 09/22 0701 - 09/23 0700 In: 466 [P.O.:460; I.V.:6] Out: 1300 [Urine:1300]  Physical Exam: General appearance: alert, cooperative, appears older than stated age, no distress and moderately obese  JVD elevated. Short neck.  Lungs:  Clear to auscultate. Heart: regular rate and rhythm, S1, S2 normal, no murmur, click, rub or gallop  Abdomen: Large pannus present, hepatojugular reflex present. Diffuse mild tenderness present, bowel sounds heard in all 4 quadrants.  Extremities: 2+ bilateral below-knee pitting edema present. No tenderness. Significant improvement. Wearing TEDs Pulses: There is no carotid bruit, femoral pulses difficult to feel, popliteal pulse faint, pedal pulse, dorsalis pedis 1-2+, posterior tibial faint, difficult to feel due to edema.  Neurologic: Grossly normal  Lab Results: BMP  Recent Labs  10/09/13 0310 10/10/13 0450 10/12/13 0520  NA 141 139 140  K 3.9 4.2 3.5*  CL 91* 91* 88*  CO2 42* 41* 42*  GLUCOSE 98 99 101*  BUN 21 16 24*  CREATININE 0.98 0.89 1.02  CALCIUM 10.0 9.7 9.8  GFRNONAA 61* 68* 58*  GFRAA 70* 79* 67*    CBC  Recent Labs Lab 10/10/13 0450  WBC 8.5  RBC 5.87*  HGB 11.4*  HCT 41.6  PLT 149*  MCV 70.9*  MCH 19.4*  MCHC 27.4*  RDW 23.5*    HEMOGLOBIN A1C Lab Results  Component Value Date   HGBA1C 7.7* 10/05/2013   MPG 174* 10/05/2013    Cardiac Panel (last 3 results)  Recent  Labs  10/05/13 0850 10/05/13 1352 10/05/13 1914  TROPONINI <0.30 <0.30 <0.30    BNP (last 3 results)  Recent Labs  10/07/13 0339 10/08/13 0340 10/09/13 0310  PROBNP 1411.0* 1089.0* 1017.0*    TSH  Recent Labs  10/06/13 0318  TSH 3.490    CHOLESTEROL No results found for this basename: CHOL,  in the last 8760 hours  Hepatic Function Panel  Recent Labs  10/04/13 1649  PROT 6.8  ALBUMIN 3.0*  AST 17  ALT 9  ALKPHOS 60  BILITOT 0.8   EKG: 10/04/2013: Sinus tachycardia at the rate of 106 beats per minute, P-Pulmonale, rightward axis, incomplete rectal branch block, low-voltage complexes, pulmonary disease pattern.  Outpatient echocardiogram 09/22/2013: Normal left ventricle systolic function, ejection fraction 55-60%, flattened septum suggests right ventricular volume overload. Left atrium mildly dilated, right atrium moderately dilated. Right ventricle moderately dilated with preserved RV systolic function. Moderate tricuspid regurgitation, moderate to severe pulmonary hypertension, PA pressure measuring 60 mm mercury. IVC dilated with poor inspiratory collapse suggests elevated right heart pressure.  Outpatient Lexiscan Myoview stress test 8/21/015: Normal perfusion, mild breast attenuation artifact in the anterior wall, ejection fraction 55-60%, low risk study.   Scheduled Meds: . amLODipine  2.5 mg Oral Daily  . aspirin EC  81 mg Oral Daily  . cholecalciferol  2,000 Units Oral Daily  . heparin  5,000 Units Subcutaneous 3 times per day  . hydrALAZINE  25 mg Oral 3 times per day  . insulin aspart  0-15 Units Subcutaneous TID WC  . insulin aspart  0-5 Units Subcutaneous QHS  . Liraglutide  1.8 mL Subcutaneous Daily  . metoprolol succinate  12.5 mg Oral Daily  . multivitamin with minerals  1 tablet Oral Daily  . nicotine  7 mg Transdermal Daily  . [START ON 10/13/2013] potassium chloride  20 mEq Oral Daily  . potassium chloride  40 mEq Oral Once  . senna-docusate  1  tablet Oral BID  . simvastatin  20 mg Oral Daily  . sodium chloride  3 mL Intravenous Q12H  . spironolactone  25 mg Oral Daily  . [START ON 10/13/2013] torsemide  40 mg Oral Daily   Continuous Infusions:   PRN Meds:.acetaminophen, famotidine, nitroGLYCERIN, sodium phosphate   Assessment/Plan:  1. Acute on chronic cor pulmonale with evidence of right ventricular heart failure, acute on chronic right ventricular systolic and diastolic heart failure. Improved and almost resoloved. 24 Liters of fluid diuresed. Pulmonary hypertension due to Pickwickian syndrome with obesity hypoventilation, morbid obesity, COPD and ongoing tobacco use disorder and dietary indiscretion.  2. Acute on chronic diastolic heart failure  3. Moderate to severe pulmonary hypertension, probably secondary etiology that includes COPD, ongoing tobacco use, morbid obesity and chronic diastolic left ventricular heart failure.  4. Acute on renal insufficiency, stage I chronic kidney disease, S. Cr improved with Dobutamine infusion 5. Diabetes mellitus with renal complication  6. Tobacco use disorder  7. Morbid obesity with obesity hypoventilation.  8. Microcytic anemia, iron studies suggest iron patient's anemia. Given her COPD status, hemoglobin of 10.0 g suggests at least moderate if not severe anemia.  9. Diabetes mellitus type 2 uncontrolled, HbA1c 10/05/2013 7.7%.  10. History of gout no recurrence of the past one year, patient is presently on experimental medication on a research study drug.  Recommendation:   Appropriate time for right heart cath is when she is stable and on appropriate fluid status. Will schedule as OP. Decrease Demadex to q daily. Discontinue Hydralazine. Stable from cardiac standpoint. Will f/u with Dr. Woody Seller in 2 weeks.  Laverda Page, M.D. 10/12/2013, 8:45 AM [facility] Pager: [phone] Office: [phone] If no answer: [phone]

## 2013-10-13 DIAGNOSIS — G4733 Obstructive sleep apnea (adult) (pediatric): Secondary | ICD-10-CM | POA: Diagnosis not present

## 2013-10-13 DIAGNOSIS — I270 Primary pulmonary hypertension: Secondary | ICD-10-CM | POA: Diagnosis not present

## 2013-10-13 DIAGNOSIS — I129 Hypertensive chronic kidney disease with stage 1 through stage 4 chronic kidney disease, or unspecified chronic kidney disease: Secondary | ICD-10-CM | POA: Diagnosis not present

## 2013-10-13 DIAGNOSIS — I509 Heart failure, unspecified: Secondary | ICD-10-CM | POA: Diagnosis not present

## 2013-10-13 DIAGNOSIS — M109 Gout, unspecified: Secondary | ICD-10-CM | POA: Diagnosis not present

## 2013-10-13 DIAGNOSIS — I132 Hypertensive heart and chronic kidney disease with heart failure and with stage 5 chronic kidney disease, or end stage renal disease: Secondary | ICD-10-CM | POA: Diagnosis not present

## 2013-10-13 DIAGNOSIS — D649 Anemia, unspecified: Secondary | ICD-10-CM | POA: Diagnosis not present

## 2013-10-13 DIAGNOSIS — I2789 Other specified pulmonary heart diseases: Secondary | ICD-10-CM | POA: Diagnosis not present

## 2013-10-13 DIAGNOSIS — E119 Type 2 diabetes mellitus without complications: Secondary | ICD-10-CM | POA: Diagnosis not present

## 2013-10-13 DIAGNOSIS — N183 Chronic kidney disease, stage 3 (moderate): Secondary | ICD-10-CM | POA: Diagnosis not present

## 2013-10-13 LAB — GLUCOSE, CAPILLARY
GLUCOSE-CAPILLARY: 129 mg/dL — AB (ref 70–99)
GLUCOSE-CAPILLARY: 156 mg/dL — AB (ref 70–99)

## 2013-10-15 DIAGNOSIS — I132 Hypertensive heart and chronic kidney disease with heart failure and with stage 5 chronic kidney disease, or end stage renal disease: Secondary | ICD-10-CM | POA: Diagnosis not present

## 2013-10-15 DIAGNOSIS — I270 Primary pulmonary hypertension: Secondary | ICD-10-CM | POA: Diagnosis not present

## 2013-10-15 DIAGNOSIS — D649 Anemia, unspecified: Secondary | ICD-10-CM | POA: Diagnosis not present

## 2013-10-15 DIAGNOSIS — G4733 Obstructive sleep apnea (adult) (pediatric): Secondary | ICD-10-CM | POA: Diagnosis not present

## 2013-10-15 DIAGNOSIS — N183 Chronic kidney disease, stage 3 (moderate): Secondary | ICD-10-CM | POA: Diagnosis not present

## 2013-10-15 DIAGNOSIS — I509 Heart failure, unspecified: Secondary | ICD-10-CM | POA: Diagnosis not present

## 2013-10-17 DIAGNOSIS — I132 Hypertensive heart and chronic kidney disease with heart failure and with stage 5 chronic kidney disease, or end stage renal disease: Secondary | ICD-10-CM | POA: Diagnosis not present

## 2013-10-17 DIAGNOSIS — D649 Anemia, unspecified: Secondary | ICD-10-CM | POA: Diagnosis not present

## 2013-10-17 DIAGNOSIS — G4733 Obstructive sleep apnea (adult) (pediatric): Secondary | ICD-10-CM | POA: Diagnosis not present

## 2013-10-17 DIAGNOSIS — N183 Chronic kidney disease, stage 3 (moderate): Secondary | ICD-10-CM | POA: Diagnosis not present

## 2013-10-17 DIAGNOSIS — I509 Heart failure, unspecified: Secondary | ICD-10-CM | POA: Diagnosis not present

## 2013-10-17 DIAGNOSIS — I270 Primary pulmonary hypertension: Secondary | ICD-10-CM | POA: Diagnosis not present

## 2013-10-18 ENCOUNTER — Encounter: Payer: Self-pay | Admitting: Adult Health

## 2013-10-18 ENCOUNTER — Ambulatory Visit (INDEPENDENT_AMBULATORY_CARE_PROVIDER_SITE_OTHER): Payer: Medicare Other | Admitting: Adult Health

## 2013-10-18 VITALS — BP 114/68 | HR 110 | Temp 98.2°F | Ht 63.5 in | Wt 244.8 lb

## 2013-10-18 DIAGNOSIS — I50811 Acute right heart failure: Secondary | ICD-10-CM

## 2013-10-18 DIAGNOSIS — N183 Chronic kidney disease, stage 3 (moderate): Secondary | ICD-10-CM | POA: Diagnosis not present

## 2013-10-18 DIAGNOSIS — G4733 Obstructive sleep apnea (adult) (pediatric): Secondary | ICD-10-CM

## 2013-10-18 DIAGNOSIS — J9600 Acute respiratory failure, unspecified whether with hypoxia or hypercapnia: Secondary | ICD-10-CM | POA: Diagnosis not present

## 2013-10-18 DIAGNOSIS — I509 Heart failure, unspecified: Secondary | ICD-10-CM

## 2013-10-18 DIAGNOSIS — I272 Pulmonary hypertension, unspecified: Secondary | ICD-10-CM

## 2013-10-18 DIAGNOSIS — D649 Anemia, unspecified: Secondary | ICD-10-CM | POA: Diagnosis not present

## 2013-10-18 DIAGNOSIS — J9601 Acute respiratory failure with hypoxia: Secondary | ICD-10-CM

## 2013-10-18 DIAGNOSIS — I132 Hypertensive heart and chronic kidney disease with heart failure and with stage 5 chronic kidney disease, or end stage renal disease: Secondary | ICD-10-CM | POA: Diagnosis not present

## 2013-10-18 DIAGNOSIS — I2789 Other specified pulmonary heart diseases: Secondary | ICD-10-CM | POA: Diagnosis not present

## 2013-10-18 DIAGNOSIS — I270 Primary pulmonary hypertension: Secondary | ICD-10-CM | POA: Diagnosis not present

## 2013-10-18 NOTE — Patient Instructions (Signed)
We will set sleep study  Continue on CPAP At bedtime  With Oxygen  Continue on Oxygen 3l/m 24/7  Great job on not smoking  Follow up Dr. Elsworth Soho  As planned in 6 weeks with PFT  Please contact office for sooner follow up if symptoms do not improve or worsen or seek emergency care

## 2013-10-18 NOTE — Progress Notes (Signed)
   Subjective:    Patient ID: Anna Clay, female    DOB: 22-Feb-1950, 63 y.o.   MRN: [id number]  HPI 63 yo female smoker presented with progressive dyspnea, hypoxia from pulmonary edema and pulmonary hypertension. She has hx of OSA s/p UPPP, diastolic heart failure, stage III CKD.   10/18/2013 [facility] follow up  Returns for a post hospital follow up .  Admitted for acute on chronic CHF w/ resp failure  Suspected to have untreated OSA. tx w/ aggressive diuresis with improvement in o2 requirements.  Echo showed severe pulomonary HTN w/ PAP 60 .  VQ scan low prob for PE. Autoimmune workup for pulmonary HTN was neg.  Discharged on CPAP , wearing every night -avg 7-8 hr.  On O2 at 3l/m 24/7.  Now on demadex and aldactone . (previously on lasix)  Has not smoked since discharge-smoked ~1/2 7yrs  Sleep study set up -needs to be scheduled.  Leg swelling is so much better. Wt is down close to 25lb since admission.  Feels so much better. Since discharge. Decreased DOE.  No chest pain,orthopnea or increased edema.   Review of Systems Constitutional:   No  weight loss, night sweats,  Fevers, chills, + fatigue, or  lassitude.  HEENT:   No headaches,  Difficulty swallowing,  Tooth/dental problems, or  Sore throat,                No sneezing, itching, ear ache,  +nasal congestion, post nasal drip,   CV:  No chest pain,  Orthopnea, PND, swelling in lower extremities, anasarca, dizziness, palpitations, syncope.   GI  No heartburn, indigestion, abdominal pain, nausea, vomiting, diarrhea, change in bowel habits, loss of appetite, bloody stools.   Resp:    No chest wall deformity  Skin: no rash or lesions.  GU: no dysuria, change in color of urine, no urgency or frequency.  No flank pain, no hematuria   MS:  No joint pain or swelling.  No decreased range of motion.  No back pain.  Psych:  No change in mood or affect. No depression or anxiety.  No memory loss.         Objective:   Physical Exam GEN: A/Ox3; pleasant , NAD, obese   HEENT:  [place]/AT,  EACs-clear, TMs-wnl, NOSE-clear, THROAT-clear, no lesions, no postnasal drip or exudate noted.   NECK:  Supple w/ fair ROM; no JVD; normal carotid impulses w/o bruits; no thyromegaly or nodules palpated; no lymphadenopathy.  RESP  Clear  P & A; w/o, wheezes/ rales/ or rhonchi.no accessory muscle use, no dullness to percussion  CARD:  RRR, no m/r/g  , tr  peripheral edema, pulses intact, no cyanosis or clubbing.  GI:   Soft & nt; nml bowel sounds; no organomegaly or masses detected.  Musco: Warm bil, no deformities or joint swelling noted.   Neuro: alert, no focal deficits noted.    Skin: Warm, no lesions or rashes         Assessment & Plan:

## 2013-10-19 DIAGNOSIS — I10 Essential (primary) hypertension: Secondary | ICD-10-CM | POA: Diagnosis not present

## 2013-10-19 DIAGNOSIS — I509 Heart failure, unspecified: Secondary | ICD-10-CM | POA: Diagnosis not present

## 2013-10-19 DIAGNOSIS — I279 Pulmonary heart disease, unspecified: Secondary | ICD-10-CM | POA: Diagnosis not present

## 2013-10-19 NOTE — Assessment & Plan Note (Signed)
Resolved with O2 and diuresis  Check PFT w/ hx of smoking

## 2013-10-19 NOTE — Assessment & Plan Note (Signed)
Improved with diuresis  Cont diuretics  Low salt diet  daiily wt

## 2013-10-19 NOTE — Assessment & Plan Note (Signed)
Autoimmune panel neg  Check sleep study Cont on CPAP

## 2013-10-19 NOTE — Assessment & Plan Note (Signed)
We will set sleep study  Continue on CPAP At bedtime  With Oxygen  Continue on Oxygen 3l/m 24/7   Please contact office for sooner follow up if symptoms do not improve or worsen or seek emergency care

## 2013-10-20 DIAGNOSIS — N183 Chronic kidney disease, stage 3 (moderate): Secondary | ICD-10-CM | POA: Diagnosis not present

## 2013-10-20 DIAGNOSIS — I509 Heart failure, unspecified: Secondary | ICD-10-CM | POA: Diagnosis not present

## 2013-10-20 DIAGNOSIS — I132 Hypertensive heart and chronic kidney disease with heart failure and with stage 5 chronic kidney disease, or end stage renal disease: Secondary | ICD-10-CM | POA: Diagnosis not present

## 2013-10-20 DIAGNOSIS — D649 Anemia, unspecified: Secondary | ICD-10-CM | POA: Diagnosis not present

## 2013-10-20 DIAGNOSIS — I270 Primary pulmonary hypertension: Secondary | ICD-10-CM | POA: Diagnosis not present

## 2013-10-20 DIAGNOSIS — G4733 Obstructive sleep apnea (adult) (pediatric): Secondary | ICD-10-CM | POA: Diagnosis not present

## 2013-10-20 NOTE — Progress Notes (Signed)
Reviewed & agree with plan  

## 2013-10-21 DIAGNOSIS — I270 Primary pulmonary hypertension: Secondary | ICD-10-CM | POA: Diagnosis not present

## 2013-10-21 DIAGNOSIS — I509 Heart failure, unspecified: Secondary | ICD-10-CM | POA: Diagnosis not present

## 2013-10-21 DIAGNOSIS — I132 Hypertensive heart and chronic kidney disease with heart failure and with stage 5 chronic kidney disease, or end stage renal disease: Secondary | ICD-10-CM | POA: Diagnosis not present

## 2013-10-21 DIAGNOSIS — D649 Anemia, unspecified: Secondary | ICD-10-CM | POA: Diagnosis not present

## 2013-10-21 DIAGNOSIS — N183 Chronic kidney disease, stage 3 (moderate): Secondary | ICD-10-CM | POA: Diagnosis not present

## 2013-10-21 DIAGNOSIS — G4733 Obstructive sleep apnea (adult) (pediatric): Secondary | ICD-10-CM | POA: Diagnosis not present

## 2013-10-24 DIAGNOSIS — G4733 Obstructive sleep apnea (adult) (pediatric): Secondary | ICD-10-CM | POA: Diagnosis not present

## 2013-10-24 DIAGNOSIS — Z7982 Long term (current) use of aspirin: Secondary | ICD-10-CM | POA: Diagnosis not present

## 2013-10-24 DIAGNOSIS — I270 Primary pulmonary hypertension: Secondary | ICD-10-CM | POA: Diagnosis not present

## 2013-10-24 DIAGNOSIS — I132 Hypertensive heart and chronic kidney disease with heart failure and with stage 5 chronic kidney disease, or end stage renal disease: Secondary | ICD-10-CM | POA: Diagnosis not present

## 2013-10-24 DIAGNOSIS — I509 Heart failure, unspecified: Secondary | ICD-10-CM | POA: Diagnosis not present

## 2013-10-24 DIAGNOSIS — N183 Chronic kidney disease, stage 3 (moderate): Secondary | ICD-10-CM | POA: Diagnosis not present

## 2013-10-24 DIAGNOSIS — I130 Hypertensive heart and chronic kidney disease with heart failure and stage 1 through stage 4 chronic kidney disease, or unspecified chronic kidney disease: Secondary | ICD-10-CM | POA: Diagnosis not present

## 2013-10-24 DIAGNOSIS — D649 Anemia, unspecified: Secondary | ICD-10-CM | POA: Diagnosis not present

## 2013-10-25 DIAGNOSIS — I132 Hypertensive heart and chronic kidney disease with heart failure and with stage 5 chronic kidney disease, or end stage renal disease: Secondary | ICD-10-CM | POA: Diagnosis not present

## 2013-10-25 DIAGNOSIS — I270 Primary pulmonary hypertension: Secondary | ICD-10-CM | POA: Diagnosis not present

## 2013-10-25 DIAGNOSIS — G4733 Obstructive sleep apnea (adult) (pediatric): Secondary | ICD-10-CM | POA: Diagnosis not present

## 2013-10-25 DIAGNOSIS — D649 Anemia, unspecified: Secondary | ICD-10-CM | POA: Diagnosis not present

## 2013-10-25 DIAGNOSIS — I509 Heart failure, unspecified: Secondary | ICD-10-CM | POA: Diagnosis not present

## 2013-10-25 DIAGNOSIS — N183 Chronic kidney disease, stage 3 (moderate): Secondary | ICD-10-CM | POA: Diagnosis not present

## 2013-10-27 DIAGNOSIS — N183 Chronic kidney disease, stage 3 (moderate): Secondary | ICD-10-CM | POA: Diagnosis not present

## 2013-10-27 DIAGNOSIS — D649 Anemia, unspecified: Secondary | ICD-10-CM | POA: Diagnosis not present

## 2013-10-27 DIAGNOSIS — I132 Hypertensive heart and chronic kidney disease with heart failure and with stage 5 chronic kidney disease, or end stage renal disease: Secondary | ICD-10-CM | POA: Diagnosis not present

## 2013-10-27 DIAGNOSIS — I509 Heart failure, unspecified: Secondary | ICD-10-CM | POA: Diagnosis not present

## 2013-10-27 DIAGNOSIS — G4733 Obstructive sleep apnea (adult) (pediatric): Secondary | ICD-10-CM | POA: Diagnosis not present

## 2013-10-27 DIAGNOSIS — I270 Primary pulmonary hypertension: Secondary | ICD-10-CM | POA: Diagnosis not present

## 2013-10-31 DIAGNOSIS — D649 Anemia, unspecified: Secondary | ICD-10-CM | POA: Diagnosis not present

## 2013-10-31 DIAGNOSIS — I132 Hypertensive heart and chronic kidney disease with heart failure and with stage 5 chronic kidney disease, or end stage renal disease: Secondary | ICD-10-CM | POA: Diagnosis not present

## 2013-10-31 DIAGNOSIS — N183 Chronic kidney disease, stage 3 (moderate): Secondary | ICD-10-CM | POA: Diagnosis not present

## 2013-10-31 DIAGNOSIS — G4733 Obstructive sleep apnea (adult) (pediatric): Secondary | ICD-10-CM | POA: Diagnosis not present

## 2013-10-31 DIAGNOSIS — I509 Heart failure, unspecified: Secondary | ICD-10-CM | POA: Diagnosis not present

## 2013-10-31 DIAGNOSIS — I270 Primary pulmonary hypertension: Secondary | ICD-10-CM | POA: Diagnosis not present

## 2013-11-02 DIAGNOSIS — G4733 Obstructive sleep apnea (adult) (pediatric): Secondary | ICD-10-CM | POA: Diagnosis not present

## 2013-11-02 DIAGNOSIS — I132 Hypertensive heart and chronic kidney disease with heart failure and with stage 5 chronic kidney disease, or end stage renal disease: Secondary | ICD-10-CM | POA: Diagnosis not present

## 2013-11-02 DIAGNOSIS — D649 Anemia, unspecified: Secondary | ICD-10-CM | POA: Diagnosis not present

## 2013-11-02 DIAGNOSIS — I509 Heart failure, unspecified: Secondary | ICD-10-CM | POA: Diagnosis not present

## 2013-11-02 DIAGNOSIS — N183 Chronic kidney disease, stage 3 (moderate): Secondary | ICD-10-CM | POA: Diagnosis not present

## 2013-11-02 DIAGNOSIS — I270 Primary pulmonary hypertension: Secondary | ICD-10-CM | POA: Diagnosis not present

## 2013-11-03 DIAGNOSIS — N183 Chronic kidney disease, stage 3 (moderate): Secondary | ICD-10-CM | POA: Diagnosis not present

## 2013-11-03 DIAGNOSIS — D649 Anemia, unspecified: Secondary | ICD-10-CM | POA: Diagnosis not present

## 2013-11-03 DIAGNOSIS — I509 Heart failure, unspecified: Secondary | ICD-10-CM | POA: Diagnosis not present

## 2013-11-03 DIAGNOSIS — I270 Primary pulmonary hypertension: Secondary | ICD-10-CM | POA: Diagnosis not present

## 2013-11-03 DIAGNOSIS — I132 Hypertensive heart and chronic kidney disease with heart failure and with stage 5 chronic kidney disease, or end stage renal disease: Secondary | ICD-10-CM | POA: Diagnosis not present

## 2013-11-03 DIAGNOSIS — G4733 Obstructive sleep apnea (adult) (pediatric): Secondary | ICD-10-CM | POA: Diagnosis not present

## 2013-11-08 DIAGNOSIS — I509 Heart failure, unspecified: Secondary | ICD-10-CM | POA: Diagnosis not present

## 2013-11-08 DIAGNOSIS — I270 Primary pulmonary hypertension: Secondary | ICD-10-CM | POA: Diagnosis not present

## 2013-11-08 DIAGNOSIS — D649 Anemia, unspecified: Secondary | ICD-10-CM | POA: Diagnosis not present

## 2013-11-08 DIAGNOSIS — I132 Hypertensive heart and chronic kidney disease with heart failure and with stage 5 chronic kidney disease, or end stage renal disease: Secondary | ICD-10-CM | POA: Diagnosis not present

## 2013-11-08 DIAGNOSIS — G4733 Obstructive sleep apnea (adult) (pediatric): Secondary | ICD-10-CM | POA: Diagnosis not present

## 2013-11-08 DIAGNOSIS — N183 Chronic kidney disease, stage 3 (moderate): Secondary | ICD-10-CM | POA: Diagnosis not present

## 2013-11-10 DIAGNOSIS — N183 Chronic kidney disease, stage 3 (moderate): Secondary | ICD-10-CM | POA: Diagnosis not present

## 2013-11-10 DIAGNOSIS — I270 Primary pulmonary hypertension: Secondary | ICD-10-CM | POA: Diagnosis not present

## 2013-11-10 DIAGNOSIS — D649 Anemia, unspecified: Secondary | ICD-10-CM | POA: Diagnosis not present

## 2013-11-10 DIAGNOSIS — I132 Hypertensive heart and chronic kidney disease with heart failure and with stage 5 chronic kidney disease, or end stage renal disease: Secondary | ICD-10-CM | POA: Diagnosis not present

## 2013-11-10 DIAGNOSIS — G4733 Obstructive sleep apnea (adult) (pediatric): Secondary | ICD-10-CM | POA: Diagnosis not present

## 2013-11-10 DIAGNOSIS — I509 Heart failure, unspecified: Secondary | ICD-10-CM | POA: Diagnosis not present

## 2013-11-15 DIAGNOSIS — N183 Chronic kidney disease, stage 3 (moderate): Secondary | ICD-10-CM | POA: Diagnosis not present

## 2013-11-15 DIAGNOSIS — I270 Primary pulmonary hypertension: Secondary | ICD-10-CM | POA: Diagnosis not present

## 2013-11-15 DIAGNOSIS — D649 Anemia, unspecified: Secondary | ICD-10-CM | POA: Diagnosis not present

## 2013-11-15 DIAGNOSIS — I509 Heart failure, unspecified: Secondary | ICD-10-CM | POA: Diagnosis not present

## 2013-11-15 DIAGNOSIS — G4733 Obstructive sleep apnea (adult) (pediatric): Secondary | ICD-10-CM | POA: Diagnosis not present

## 2013-11-15 DIAGNOSIS — I132 Hypertensive heart and chronic kidney disease with heart failure and with stage 5 chronic kidney disease, or end stage renal disease: Secondary | ICD-10-CM | POA: Diagnosis not present

## 2013-11-16 DIAGNOSIS — I2781 Cor pulmonale (chronic): Secondary | ICD-10-CM | POA: Diagnosis not present

## 2013-11-16 DIAGNOSIS — I10 Essential (primary) hypertension: Secondary | ICD-10-CM | POA: Diagnosis not present

## 2013-11-16 DIAGNOSIS — I509 Heart failure, unspecified: Secondary | ICD-10-CM | POA: Diagnosis not present

## 2013-11-21 DIAGNOSIS — I130 Hypertensive heart and chronic kidney disease with heart failure and stage 1 through stage 4 chronic kidney disease, or unspecified chronic kidney disease: Secondary | ICD-10-CM | POA: Diagnosis not present

## 2013-11-21 DIAGNOSIS — E1122 Type 2 diabetes mellitus with diabetic chronic kidney disease: Secondary | ICD-10-CM | POA: Diagnosis not present

## 2013-11-21 DIAGNOSIS — I5020 Unspecified systolic (congestive) heart failure: Secondary | ICD-10-CM | POA: Diagnosis not present

## 2013-11-21 DIAGNOSIS — N182 Chronic kidney disease, stage 2 (mild): Secondary | ICD-10-CM | POA: Diagnosis not present

## 2013-11-30 ENCOUNTER — Institutional Professional Consult (permissible substitution): Payer: Medicare Other | Admitting: Pulmonary Disease

## 2013-12-05 ENCOUNTER — Other Ambulatory Visit: Payer: Self-pay | Admitting: Pulmonary Disease

## 2013-12-05 DIAGNOSIS — R0600 Dyspnea, unspecified: Secondary | ICD-10-CM

## 2013-12-06 ENCOUNTER — Ambulatory Visit (INDEPENDENT_AMBULATORY_CARE_PROVIDER_SITE_OTHER): Payer: Medicare Other | Admitting: Pulmonary Disease

## 2013-12-06 ENCOUNTER — Encounter: Payer: Self-pay | Admitting: Pulmonary Disease

## 2013-12-06 VITALS — BP 124/78 | HR 89 | Ht 63.25 in | Wt 244.0 lb

## 2013-12-06 DIAGNOSIS — N183 Chronic kidney disease, stage 3 (moderate): Secondary | ICD-10-CM | POA: Diagnosis not present

## 2013-12-06 DIAGNOSIS — J9601 Acute respiratory failure with hypoxia: Secondary | ICD-10-CM | POA: Diagnosis not present

## 2013-12-06 DIAGNOSIS — G4733 Obstructive sleep apnea (adult) (pediatric): Secondary | ICD-10-CM | POA: Diagnosis not present

## 2013-12-06 DIAGNOSIS — I132 Hypertensive heart and chronic kidney disease with heart failure and with stage 5 chronic kidney disease, or end stage renal disease: Secondary | ICD-10-CM | POA: Diagnosis not present

## 2013-12-06 DIAGNOSIS — I509 Heart failure, unspecified: Secondary | ICD-10-CM | POA: Diagnosis not present

## 2013-12-06 DIAGNOSIS — I270 Primary pulmonary hypertension: Secondary | ICD-10-CM

## 2013-12-06 DIAGNOSIS — R0600 Dyspnea, unspecified: Secondary | ICD-10-CM | POA: Diagnosis not present

## 2013-12-06 DIAGNOSIS — I272 Pulmonary hypertension, unspecified: Secondary | ICD-10-CM

## 2013-12-06 DIAGNOSIS — D649 Anemia, unspecified: Secondary | ICD-10-CM | POA: Diagnosis not present

## 2013-12-06 LAB — PULMONARY FUNCTION TEST
DL/VA % pred: 70 %
DL/VA: 3.31 ml/min/mmHg/L
DLCO unc % pred: 64 %
DLCO unc: 14.91 ml/min/mmHg
FEF 25-75 POST: 1.92 L/s
FEF 25-75 PRE: 2.22 L/s
FEF2575-%CHANGE-POST: -13 %
FEF2575-%PRED-POST: 100 %
FEF2575-%PRED-PRE: 116 %
FEV1-%Change-Post: -2 %
FEV1-%PRED-POST: 108 %
FEV1-%Pred-Pre: 110 %
FEV1-Post: 2.11 L
FEV1-Pre: 2.15 L
FEV1FVC-%Change-Post: 2 %
FEV1FVC-%PRED-PRE: 101 %
FEV6-%Change-Post: -2 %
FEV6-%PRED-POST: 105 %
FEV6-%Pred-Pre: 108 %
FEV6-Post: 2.53 L
FEV6-Pre: 2.61 L
FEV6FVC-%Change-Post: 1 %
FEV6FVC-%Pred-Post: 102 %
FEV6FVC-%Pred-Pre: 101 %
FVC-%Change-Post: -4 %
FVC-%PRED-POST: 102 %
FVC-%PRED-PRE: 107 %
FVC-PRE: 2.69 L
FVC-Post: 2.57 L
POST FEV1/FVC RATIO: 82 %
Post FEV6/FVC ratio: 99 %
Pre FEV1/FVC ratio: 80 %
Pre FEV6/FVC Ratio: 97 %
RV % pred: 113 %
RV: 2.28 L
TLC % pred: 102 %
TLC: 5.06 L

## 2013-12-06 NOTE — Progress Notes (Signed)
PFT done today. 

## 2013-12-06 NOTE — Assessment & Plan Note (Signed)
Decreased diffusion probably reflects primary hypertension, since there is no evidence of airway obstruction

## 2013-12-06 NOTE — Patient Instructions (Signed)
Lung function is OK - you do not need breathing medications Ambulatory satn Check CPAP download from [facility] Sleep study on dec 9 th

## 2013-12-06 NOTE — Assessment & Plan Note (Signed)
Split polysomnogram has been scheduled for December 6. Given excessive daytime somnolence, narrow pharyngeal exam, witnessed apneas & loud snoring, obstructive sleep apnea is very likely & an overnight polysomnogram will be scheduled as a split study. The pathophysiology of obstructive sleep apnea , it's cardiovascular consequences & modes of treatment including CPAP were discused with the patient in detail & they evidenced understanding. Deep abdominal order will be checked, and pressure will be adjusted based on sleep study

## 2013-12-06 NOTE — Progress Notes (Signed)
Subjective:    Patient ID: Anna Clay, female    DOB: Aug 01, 1950, 63 y.o.   MRN: [id number]  HPI   63 yo female smoker Admitted 09/2013 for acute on chronic CHF w/ resp failure  She presented with progressive dyspnea, hypoxia from pulmonary edema and pulmonary hypertension. She has hx of OSA s/p UPPP, diastolic heart failure, stage III CKD. Discharged on CPAP , wearing every night -avg 7-8 hr. On O2 at 3l/m 24/7.   She presents today for evaluation of sleep-disordered breathing. She underwent UPPP in 2010 Epworth sleepiness score is 6-much improved on sleep apnea. Bedtime is around midnight, she sleeps on her side but rolls onto her back with 2 pillows, sleep latency is minimal, awakenings have reduced to one to 2 per night, she is out of bed by 8:30 AM feeling rested, denies dryness of mouth or headaches. She has lost 60 pounds since hospital admission Now on demadex and aldactone . 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  VQ scan low prob for PE. Autoimmune workup for pulmonary HTN was neg.    PFTs  11/2013 - no obstruction, nml volumes,DLCO 69 (probably related to pulmonary hypertension_ She desaturated to 90% on walking 1 lap on room air, heart rate increased to 125   Past Medical History  Diagnosis Date  . Obesity   . Chest pain   . Hypertension   . Hypercholesteremia   . Diabetes mellitus with kidney disease   . Gout   . OSA (obstructive sleep apnea)   . Diastolic heart failure     Past Surgical History  Procedure Laterality Date  . Uvulopalatopharyngoplasty (uppp)/tonsillectomy/septoplasty  01/25/2008    By Minna Merritts  . Abdominal hysterectomy      Allergies  Allergen  Reactions  . Ivp Dye [Iodinated Diagnostic Agents] Other (See Comments)    Reaction unknown  . Tetracyclines & Related Other (See Comments)    Reaction unknown    History   Social History  . Marital Status: Divorced    Spouse Name: N/A    Number of Children: 1  . Years of Education: N/A   Occupational History  . retired    Social History Main Topics  . Smoking status: Former Smoker -- 1.00 packs/day for 43 years    Types: Cigarettes    Quit date: 09/20/2013  . Smokeless tobacco: Not on file  . Alcohol Use: 0.0 oz/week    0 Not specified per week  . Drug Use: No  . Sexual Activity: Not on file   Other Topics Concern  . Not on file   Social History Narrative    Family History  Problem Relation Age of Onset  . Heart disease Father   . Heart disease Sister   . Cancer - Lung Sister   . Sickle cell trait      all family members  . Cancer Brother     bone  . Diabetes Brother   . Diabetes Sister     Review of Systems  Constitutional: Positive for appetite change and unexpected weight change. Negative for fever.  HENT: Positive for dental problem. Negative for congestion, ear pain, nosebleeds, postnasal drip, rhinorrhea, sinus pressure, sneezing, sore  throat and trouble swallowing.   Eyes: Negative for redness and itching.  Respiratory: Positive for apnea, cough and shortness of breath. Negative for chest tightness and wheezing.   Cardiovascular: Positive for palpitations and leg swelling.  Gastrointestinal: Negative for nausea and vomiting.  Genitourinary: Negative for dysuria.  Musculoskeletal: Negative for joint swelling.  Skin: Negative for rash.  Neurological: Negative for headaches.  Hematological: Does not bruise/bleed easily.  Psychiatric/Behavioral: Negative for dysphoric mood. The patient is not nervous/anxious.        Objective:   Physical Exam  Gen. Pleasant, obese, in no distress, normal affect ENT - no lesions, no post nasal drip, class 2-3  airway, UPPP Neck: No JVD, no thyromegaly, no carotid bruits Lungs: no use of accessory muscles, no dullness to percussion, decreased without rales or rhonchi  Cardiovascular: Rhythm regular, heart sounds  normal, no murmurs or gallops, no peripheral edema Abdomen: soft and non-tender, no hepatosplenomegaly, BS normal. Musculoskeletal: No deformities, no cyanosis or clubbing Neuro:  alert, non focal, no tremors       Assessment & Plan:

## 2013-12-06 NOTE — Assessment & Plan Note (Signed)
Hypoxia seems to have improved post diuresis to the point where she does not seem to need oxygen on room air. Would suggest to continue using it on ambulation and during sleep

## 2013-12-28 ENCOUNTER — Ambulatory Visit (HOSPITAL_BASED_OUTPATIENT_CLINIC_OR_DEPARTMENT_OTHER): Payer: Medicare Other | Attending: Pulmonary Disease | Admitting: Radiology

## 2013-12-28 VITALS — Ht 63.5 in | Wt 244.0 lb

## 2013-12-28 DIAGNOSIS — G4733 Obstructive sleep apnea (adult) (pediatric): Secondary | ICD-10-CM | POA: Diagnosis not present

## 2014-01-18 ENCOUNTER — Telehealth: Payer: Self-pay | Admitting: Pulmonary Disease

## 2014-01-18 DIAGNOSIS — G4710 Hypersomnia, unspecified: Secondary | ICD-10-CM

## 2014-01-18 NOTE — Telephone Encounter (Signed)
PSG showed-REM related moderate OSA. Needs C Pap download and follow-up visit-to decide need for C Pap titration study

## 2014-01-18 NOTE — Telephone Encounter (Signed)
Patient scheduled.  Order for download sent to DME.  Patient says she will take CPAP by [facility] to get download done as well.  Nothing further needed.

## 2014-01-18 NOTE — Sleep Study (Signed)
[place]   NAME: Anna Clay  DATE OF BIRTH: 21-Nov-1950  MEDICAL RECORD [id number]  LOCATION: [facility]   PHYSICIAN: ALVA,RAKESH V.   DATE OF STUDY:    SLEEP STUDY TYPE: Nocturnal Polysomnogram   REFERRING PHYSICIAN: Rigoberto Noel, MD   INDICATION FOR STUDY: 63 year old smoker with severe pulmonary hypertension and hypoxemia maintained on 3 L oxygen. She is status post UPPP in 2010 for OSA  At the time of this study ,they weighed 244 pounds with a height of 5 ft 3 inches and the BMI of 43, neck size of 17 inches. Epworth sleepiness score was 1   This nocturnal polysomnogram was performed with a sleep technologist in attendance. EEG, EOG,EMG and respiratory parameters recorded. Sleep stages, arousals, limb movements and respiratory data was scored according to criteria laid out by the American Academy of sleep medicine.   SLEEP ARCHITECTURE: Lights out was at 22-30 PM and lights on was at 5 AM. Total sleep time was 351 minutes with a sleep period time of 381 minutes and a sleep efficiency of 90 %. Sleep latency was 8 minutes with latency to REM sleep of 97 minutes and wake after sleep onset of 31 minutes. . Sleep stages as a percentage of total sleep time was N1 -4 %,N2- 78 % and REM sleep 17.5 % ( 61 minutes) . The longest period of REM sleep was around 2:30 AM.   AROUSAL DATA : There were 117  arousals with an arousal index of 20 events per hour. Most of these were spontaneous & 31 were associated with respiratory events  RESPIRATORY DATA: There were 66 obstructive apneas, 0 central apneas, 1 mixed apneas and 3 hypopneas with apnea -hypopnea index of 12 events per hour. There were 34 RERAs with an RDI of 18 events per hour. There was no relation to sleep stage or body position. Supine sleep was  noted  MOVEMENT/PARASOMNIA: There were 0 PLMS with a PLM index of 0 events per hour. The PLM arousal index was 0 per hour.  OXYGEN DATA: The  lowest desaturation was 79 % during REM sleep and the desaturation index was 18 per hour.   CARDIAC DATA: The low heart rate was 52 beats per minute. The high heart rate recorded was an artifact. No arrhythmias were noted   DISCUSSION -Loud snoring was noted . She did not meet criteria for CPAP intervention. She was desensitized with a wide fullface mask  IMPRESSION :  1. Moderate obstructive sleep apnea, predominantly during REM supine sleep ,with hypopneas causing sleep fragmentation and moderate oxygen desaturation. Note that the study was performed on 3 L oxygen 2. No evidence of cardiac arrhythmias,periodic limb movements or behavioral disturbance during sleep.  3. Sleep efficiency was good  RECOMMENDATION:  1. Treatment options for this degree of sleep disordered breathing include weight loss and CPAP therapy  2. Patient should be cautioned against driving when sleepy  3. They should be asked to avoid medications with sedative side effects    Rigoberto Noel MD Diplomate, American Board of Sleep Medicine    ELECTRONICALLY SIGNED ON: 01/18/2014  [facility]  PH: [phone] FX: (336) [place]

## 2014-01-30 ENCOUNTER — Ambulatory Visit (INDEPENDENT_AMBULATORY_CARE_PROVIDER_SITE_OTHER): Payer: Medicare Other | Admitting: Pulmonary Disease

## 2014-01-30 ENCOUNTER — Encounter: Payer: Self-pay | Admitting: Pulmonary Disease

## 2014-01-30 VITALS — BP 120/80 | HR 106 | Temp 98.5°F | Ht 63.5 in | Wt 245.4 lb

## 2014-01-30 DIAGNOSIS — I272 Pulmonary hypertension, unspecified: Secondary | ICD-10-CM

## 2014-01-30 DIAGNOSIS — I270 Primary pulmonary hypertension: Secondary | ICD-10-CM

## 2014-01-30 DIAGNOSIS — J9601 Acute respiratory failure with hypoxia: Secondary | ICD-10-CM

## 2014-01-30 DIAGNOSIS — G4733 Obstructive sleep apnea (adult) (pediatric): Secondary | ICD-10-CM | POA: Diagnosis not present

## 2014-01-30 NOTE — Patient Instructions (Addendum)
You have to QUIT smoking  Trial of nicotrol inhaler #3 -call for more if this works  Lower pressure on CPAP If this does not relieve pressure, OK to trial nasal pillows  Oxygen testing -walk & night check  Will need blood work Artist) with Dr Baird Cancer &  Repeat echo in the future

## 2014-01-30 NOTE — Progress Notes (Signed)
   Subjective:    Patient ID: Anna Clay, female    DOB: 1950/06/17, 64 y.o.   MRN: [id number]  HPI  64 yo female smoker Admitted 09/2013 for acute on chronic CHF w/ resp failure  She presented with progressive dyspnea, hypoxia from pulmonary edema and pulmonary hypertension. She has hx of OSA s/p UPPP in [id number], diastolic heart failure, stage III CKD. Discharged on CPAP & O2 at 3l/m 24/7.   She has lost 60 pounds since hospital admission Now on demadex and aldactone . 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  VQ scan low prob for PE. Autoimmune workup for pulmonary HTN was neg.    PFTs 11/2013 - no obstruction, nml volumes,DLCO 69 (probably related to pulmonary hypertension_  PSG 12/2013 showed-REM related moderate OSA.  01/30/2014  Chief Complaint  Patient presents with  . Follow-up    breathing doing great.  CPAP every night @ 5cm; wants to get nasal pillows.  Mask is hurting face.  Discuss not wearing oxygen during the day, just using CPAP at night.   She is compliant with her C Pap -nasal mask makes an indentation on her face and she would like to try nasal pillows She is able to stay off oxygen at home-with her pulse oximeter, O2 sat has been staying above 95%. She feels that her ankles are skinny again-has diuresed well with Demadex. She does not desaturate on walking today on room air. She relapsed during Christmas and started smoking again about 2 cigarettes a day. 3 months download 12/2013-shows excellent compliance, average pressure 11 cm, residuals less than 5, large leak     Review of Systems neg for any significant sore throat, dysphagia, itching, sneezing, nasal congestion or excess/ purulent secretions, fever, chills, sweats, unintended wt loss, pleuritic or exertional  cp, hempoptysis, orthopnea pnd or change in chronic leg swelling. Also denies presyncope, palpitations, heartburn, abdominal pain, nausea, vomiting, diarrhea or change in bowel or urinary habits, dysuria,hematuria, rash, arthralgias, visual complaints, headache, numbness weakness or ataxia.     Objective:   Physical Exam  Gen. Pleasant, obese, in no distress ENT - no lesions, no post nasal drip Neck: No JVD, no thyromegaly, no carotid bruits Lungs: no use of accessory muscles, no dullness to percussion, decreased without rales or rhonchi  Cardiovascular: Rhythm regular, heart sounds  normal, no murmurs or gallops, no peripheral edema Musculoskeletal: No deformities, no cyanosis or clubbing , no tremors       Assessment & Plan:

## 2014-01-30 NOTE — Assessment & Plan Note (Signed)
Will need repeat echo in the future -expect pulmonary pressures to come down, since she is better diuresed and hypoxia has resolved

## 2014-01-30 NOTE — Assessment & Plan Note (Signed)
Oxygen testing -walk & night check  Will need blood work Artist) with Dr Baird Cancer &  Repeat echo in the future

## 2014-01-30 NOTE — Assessment & Plan Note (Signed)
Lower pressure on CPAP to auto 5-12 cm If this does not relieve pressure, OK to trial nasal pillows   Weight loss encouraged, compliance with goal of at least 4-6 hrs every night is the expectation. Advised against medications with sedative side effects Cautioned against driving when sleepy - understanding that sleepiness will vary on a day to day basis

## 2014-02-02 ENCOUNTER — Telehealth: Payer: Self-pay | Admitting: Pulmonary Disease

## 2014-02-02 DIAGNOSIS — G4733 Obstructive sleep apnea (adult) (pediatric): Secondary | ICD-10-CM

## 2014-02-02 NOTE — Telephone Encounter (Signed)
Spoke with Anna Clay, Pt received cpap when discharged from hospital so there was no formal order placed with [facility] to start cpap.  Needs a new cpap start order with pressure, mask, everything.  Also needs a dated sleep study.  The sleep study that [facility] received was not dated.  RA needs to sign and date this with the date that the sleep study was performed AND reviewed, and Fax to [phone] Attn: Dimas Chyle.    Routing to [place] as RA and her are in [place] today.  Thanks!

## 2014-02-03 NOTE — Telephone Encounter (Signed)
Order sent to [facility].  Nothing further needed.

## 2014-02-08 ENCOUNTER — Ambulatory Visit (HOSPITAL_COMMUNITY): Payer: Medicare Other | Attending: Cardiovascular Disease | Admitting: Radiology

## 2014-02-08 DIAGNOSIS — Z6841 Body Mass Index (BMI) 40.0 and over, adult: Secondary | ICD-10-CM | POA: Insufficient documentation

## 2014-02-08 DIAGNOSIS — I272 Pulmonary hypertension, unspecified: Secondary | ICD-10-CM

## 2014-02-08 DIAGNOSIS — I270 Primary pulmonary hypertension: Secondary | ICD-10-CM | POA: Diagnosis not present

## 2014-02-08 DIAGNOSIS — Z720 Tobacco use: Secondary | ICD-10-CM | POA: Diagnosis not present

## 2014-02-08 NOTE — Progress Notes (Signed)
Echocardiogram performed.  

## 2014-02-11 ENCOUNTER — Telehealth: Payer: Self-pay | Admitting: Pulmonary Disease

## 2014-02-11 NOTE — Telephone Encounter (Signed)
ONO on CPAP/RA shows desatn x 367mins Ct 2l O2 blended into CPAP

## 2014-02-13 NOTE — Telephone Encounter (Signed)
Patient notified.  No questions or concerns at this time. Nothing further needed.   

## 2014-02-23 DIAGNOSIS — E1122 Type 2 diabetes mellitus with diabetic chronic kidney disease: Secondary | ICD-10-CM | POA: Diagnosis not present

## 2014-02-23 DIAGNOSIS — N183 Chronic kidney disease, stage 3 (moderate): Secondary | ICD-10-CM | POA: Diagnosis not present

## 2014-02-23 DIAGNOSIS — I129 Hypertensive chronic kidney disease with stage 1 through stage 4 chronic kidney disease, or unspecified chronic kidney disease: Secondary | ICD-10-CM | POA: Diagnosis not present

## 2014-02-23 DIAGNOSIS — M791 Myalgia: Secondary | ICD-10-CM | POA: Diagnosis not present

## 2014-03-10 ENCOUNTER — Telehealth: Payer: Self-pay | Admitting: Pulmonary Disease

## 2014-03-10 NOTE — Telephone Encounter (Signed)
Spoke with Melissa, states that pt was set up on cpap yesterday.  [facility] needs to know if they need to bleed 02 into pt's cpap.  If so, we need to place an order for this.  RA please advise.  Thank yo!

## 2014-03-10 NOTE — Telephone Encounter (Signed)
Anna Clay

## 2014-03-10 NOTE — Telephone Encounter (Signed)
lmtcb for Melissa.  

## 2014-03-13 ENCOUNTER — Other Ambulatory Visit: Payer: Self-pay | Admitting: *Deleted

## 2014-03-13 DIAGNOSIS — G4733 Obstructive sleep apnea (adult) (pediatric): Secondary | ICD-10-CM

## 2014-03-13 NOTE — Telephone Encounter (Signed)
Order entered for 2L 02 Blended into CPAP.  Message sent to [facility].  Nothing further needed.

## 2014-03-13 NOTE — Telephone Encounter (Signed)
2L O2 blended into CPAP

## 2014-03-15 DIAGNOSIS — I10 Essential (primary) hypertension: Secondary | ICD-10-CM | POA: Diagnosis not present

## 2014-03-15 DIAGNOSIS — I509 Heart failure, unspecified: Secondary | ICD-10-CM | POA: Diagnosis not present

## 2014-03-15 DIAGNOSIS — I2781 Cor pulmonale (chronic): Secondary | ICD-10-CM | POA: Diagnosis not present

## 2014-05-11 DIAGNOSIS — I2781 Cor pulmonale (chronic): Secondary | ICD-10-CM | POA: Diagnosis not present

## 2014-05-29 DIAGNOSIS — N183 Chronic kidney disease, stage 3 (moderate): Secondary | ICD-10-CM | POA: Diagnosis not present

## 2014-05-29 DIAGNOSIS — M85852 Other specified disorders of bone density and structure, left thigh: Secondary | ICD-10-CM | POA: Diagnosis not present

## 2014-05-29 DIAGNOSIS — I129 Hypertensive chronic kidney disease with stage 1 through stage 4 chronic kidney disease, or unspecified chronic kidney disease: Secondary | ICD-10-CM | POA: Diagnosis not present

## 2014-05-29 DIAGNOSIS — E1122 Type 2 diabetes mellitus with diabetic chronic kidney disease: Secondary | ICD-10-CM | POA: Diagnosis not present

## 2014-05-29 DIAGNOSIS — N08 Glomerular disorders in diseases classified elsewhere: Secondary | ICD-10-CM | POA: Diagnosis not present

## 2014-05-31 ENCOUNTER — Ambulatory Visit: Payer: PRIVATE HEALTH INSURANCE | Admitting: Adult Health

## 2014-06-05 ENCOUNTER — Encounter: Payer: Self-pay | Admitting: Adult Health

## 2014-06-05 ENCOUNTER — Ambulatory Visit (INDEPENDENT_AMBULATORY_CARE_PROVIDER_SITE_OTHER): Payer: Medicare Other | Admitting: Adult Health

## 2014-06-05 VITALS — BP 120/62 | HR 100 | Temp 98.6°F | Ht 63.0 in | Wt 236.2 lb

## 2014-06-05 DIAGNOSIS — J309 Allergic rhinitis, unspecified: Secondary | ICD-10-CM | POA: Diagnosis not present

## 2014-06-05 DIAGNOSIS — I270 Primary pulmonary hypertension: Secondary | ICD-10-CM

## 2014-06-05 DIAGNOSIS — G4733 Obstructive sleep apnea (adult) (pediatric): Secondary | ICD-10-CM

## 2014-06-05 DIAGNOSIS — I272 Pulmonary hypertension, unspecified: Secondary | ICD-10-CM

## 2014-06-05 MED ORDER — MOMETASONE FUROATE 50 MCG/ACT NA SUSP: 2.0000 | Freq: Every day | NASAL | Status: DC

## 2014-06-05 NOTE — Patient Instructions (Addendum)
Try Nasonex 2 puff daily until sample is gone, call back if want refill sent to pharmacy .  Continue on current regimen.  Keep up great work.  Continue on CPAP At bedtime  .  Do not drive if sleepy.  Follow up Dr. Elsworth Soho  In 4 months and As needed

## 2014-06-07 DIAGNOSIS — J309 Allergic rhinitis, unspecified: Secondary | ICD-10-CM | POA: Insufficient documentation

## 2014-06-07 NOTE — Assessment & Plan Note (Signed)
Try Nasonex 2 puff daily until sample is gone, call back if want refill sent to pharmacy .

## 2014-06-07 NOTE — Assessment & Plan Note (Signed)
Continue on CPAP At bedtime  .  Do not drive if sleepy.  Follow up Dr. Elsworth Soho  In 4 months and As needed

## 2014-06-07 NOTE — Progress Notes (Signed)
   Subjective:    Patient ID: Anna Clay, female    DOB: May 23, 1950, 64 y.o.   MRN: [id number]  HPI  64 yo female smoker Admitted 09/2013 for acute on chronic CHF w/ resp failure  She presented with progressive dyspnea, hypoxia from pulmonary edema and pulmonary hypertension. She has hx of OSA s/p UPPP in [id number], diastolic heart failure, stage III CKD. Discharged on CPAP & O2 at 3l/m 24/7.   She has lost 60 pounds since hospital admission Now on demadex and aldactone . (previously on lasix)  Has not smoked since discharge-smoked ~one pack per dayx 40 years-about 40 pack years  She worked in housekeeping at [employer]/ [employer]  Echo showed severe pulmonary HTN w/ PAP 60 .  VQ scan low prob for PE. Autoimmune workup for pulmonary HTN was neg.    PFTs 11/2013 - no obstruction, nml volumes,DLCO 69 (probably related to pulmonary hypertension_  PSG 12/2013 showed-REM related moderate OSA.  01/30/2014  Chief Complaint  Patient presents with  . Follow-up    breathing doing great.  CPAP every night @ 5cm; wants to get nasal pillows.  Mask is hurting face.  Discuss not wearing oxygen during the day, just using CPAP at night.   She is compliant with her C Pap -nasal mask makes an indentation on her face and she would like to try nasal pillows She is able to stay off oxygen at home-with her pulse oximeter, O2 sat has been staying above 95%. She feels that her ankles are skinny again-has diuresed well with Demadex. She does not desaturate on walking today on room air. She relapsed during Christmas and started smoking again about 2 cigarettes a day. 3 months download 12/2013-shows excellent compliance, average pressure 11 cm, residuals less than 5, large leak  06/05/14 Follow up: OSA  Patient returns for a four-month follow-up for sleep apnea Says overall she is doing well.  Wears C Pap at least 7-8 hours each night Download shows excellent usage. Shows average  usage at 7.5 hours. Leaks okay, AHI 3.6. Feels rested. Does complain over the last few weeks, that she's had itchy watery eyes. Nasal drip. Feels that the pollen is causing this. denies any fever, chest pain, orthopnea, PND, discolored mucus  Denies any edema Had repeat echo January 20th 2016 that showed an EF at 0000000, grade 1 diastolic dysfunction and normal pulmonary artery pressures. This was much improved. Remains on Aldactone and Demadex.    Review of Systems neg for any significant sore throat, dysphagia, , fever, chills, sweats, unintended wt loss, pleuritic or exertional cp, hempoptysis, orthopnea pnd or change in chronic leg swelling. Also denies presyncope, palpitations, heartburn, abdominal pain, nausea, vomiting, diarrhea or change in bowel or urinary habits, dysuria,hematuria, rash, arthralgias, visual complaints, headache, numbness weakness or ataxia.     Objective:   Physical Exam  Gen. Pleasant, obese, in no distress ENT - no lesions, no post nasal drip Neck: No JVD, no thyromegaly, no carotid bruits Lungs: no use of accessory muscles, no dullness to percussion, decreased without rales or rhonchi  Cardiovascular: Rhythm regular, heart sounds  normal, no murmurs or gallops, no peripheral edema Musculoskeletal: No deformities, no cyanosis or clubbing , no tremors       Assessment & Plan:

## 2014-06-07 NOTE — Assessment & Plan Note (Signed)
Repeat echo in January of this year showed normal pulmonary artery pressures.

## 2014-06-08 DIAGNOSIS — H2513 Age-related nuclear cataract, bilateral: Secondary | ICD-10-CM | POA: Diagnosis not present

## 2014-06-08 DIAGNOSIS — H35342 Macular cyst, hole, or pseudohole, left eye: Secondary | ICD-10-CM | POA: Diagnosis not present

## 2014-06-08 DIAGNOSIS — H40033 Anatomical narrow angle, bilateral: Secondary | ICD-10-CM | POA: Diagnosis not present

## 2014-06-08 DIAGNOSIS — E119 Type 2 diabetes mellitus without complications: Secondary | ICD-10-CM | POA: Diagnosis not present

## 2014-06-14 DIAGNOSIS — H40033 Anatomical narrow angle, bilateral: Secondary | ICD-10-CM | POA: Diagnosis not present

## 2014-06-14 DIAGNOSIS — H40032 Anatomical narrow angle, left eye: Secondary | ICD-10-CM | POA: Diagnosis not present

## 2014-07-05 DIAGNOSIS — H40031 Anatomical narrow angle, right eye: Secondary | ICD-10-CM | POA: Diagnosis not present

## 2014-07-05 DIAGNOSIS — H40033 Anatomical narrow angle, bilateral: Secondary | ICD-10-CM | POA: Diagnosis not present

## 2014-07-18 DIAGNOSIS — I10 Essential (primary) hypertension: Secondary | ICD-10-CM | POA: Diagnosis not present

## 2014-07-18 DIAGNOSIS — I509 Heart failure, unspecified: Secondary | ICD-10-CM | POA: Diagnosis not present

## 2014-07-18 DIAGNOSIS — I2781 Cor pulmonale (chronic): Secondary | ICD-10-CM | POA: Diagnosis not present

## 2014-07-21 DIAGNOSIS — E1122 Type 2 diabetes mellitus with diabetic chronic kidney disease: Secondary | ICD-10-CM | POA: Diagnosis not present

## 2014-09-06 DIAGNOSIS — H25013 Cortical age-related cataract, bilateral: Secondary | ICD-10-CM | POA: Diagnosis not present

## 2014-09-06 DIAGNOSIS — H35342 Macular cyst, hole, or pseudohole, left eye: Secondary | ICD-10-CM | POA: Diagnosis not present

## 2014-09-06 DIAGNOSIS — H2513 Age-related nuclear cataract, bilateral: Secondary | ICD-10-CM | POA: Diagnosis not present

## 2014-09-06 DIAGNOSIS — E119 Type 2 diabetes mellitus without complications: Secondary | ICD-10-CM | POA: Diagnosis not present

## 2014-09-21 DIAGNOSIS — H35342 Macular cyst, hole, or pseudohole, left eye: Secondary | ICD-10-CM | POA: Diagnosis not present

## 2014-09-21 DIAGNOSIS — H25012 Cortical age-related cataract, left eye: Secondary | ICD-10-CM | POA: Diagnosis not present

## 2014-09-21 DIAGNOSIS — H25812 Combined forms of age-related cataract, left eye: Secondary | ICD-10-CM | POA: Diagnosis not present

## 2014-09-21 DIAGNOSIS — H2512 Age-related nuclear cataract, left eye: Secondary | ICD-10-CM | POA: Diagnosis not present

## 2014-09-21 DIAGNOSIS — E119 Type 2 diabetes mellitus without complications: Secondary | ICD-10-CM | POA: Diagnosis not present

## 2014-10-03 DIAGNOSIS — E119 Type 2 diabetes mellitus without complications: Secondary | ICD-10-CM | POA: Diagnosis not present

## 2014-10-03 DIAGNOSIS — H35342 Macular cyst, hole, or pseudohole, left eye: Secondary | ICD-10-CM | POA: Diagnosis not present

## 2014-11-07 DIAGNOSIS — H35342 Macular cyst, hole, or pseudohole, left eye: Secondary | ICD-10-CM | POA: Diagnosis not present

## 2014-12-06 DIAGNOSIS — E1122 Type 2 diabetes mellitus with diabetic chronic kidney disease: Secondary | ICD-10-CM | POA: Diagnosis not present

## 2014-12-06 DIAGNOSIS — I130 Hypertensive heart and chronic kidney disease with heart failure and stage 1 through stage 4 chronic kidney disease, or unspecified chronic kidney disease: Secondary | ICD-10-CM | POA: Diagnosis not present

## 2014-12-06 DIAGNOSIS — Z23 Encounter for immunization: Secondary | ICD-10-CM | POA: Diagnosis not present

## 2014-12-06 DIAGNOSIS — N08 Glomerular disorders in diseases classified elsewhere: Secondary | ICD-10-CM | POA: Diagnosis not present

## 2014-12-06 DIAGNOSIS — N183 Chronic kidney disease, stage 3 (moderate): Secondary | ICD-10-CM | POA: Diagnosis not present

## 2014-12-19 DIAGNOSIS — H35342 Macular cyst, hole, or pseudohole, left eye: Secondary | ICD-10-CM | POA: Diagnosis not present

## 2015-01-17 DIAGNOSIS — I509 Heart failure, unspecified: Secondary | ICD-10-CM | POA: Diagnosis not present

## 2015-01-17 DIAGNOSIS — I10 Essential (primary) hypertension: Secondary | ICD-10-CM | POA: Diagnosis not present

## 2015-01-17 DIAGNOSIS — I2781 Cor pulmonale (chronic): Secondary | ICD-10-CM | POA: Diagnosis not present

## 2015-01-18 DIAGNOSIS — N08 Glomerular disorders in diseases classified elsewhere: Secondary | ICD-10-CM | POA: Diagnosis not present

## 2015-01-18 DIAGNOSIS — E559 Vitamin D deficiency, unspecified: Secondary | ICD-10-CM | POA: Diagnosis not present

## 2015-01-18 DIAGNOSIS — Z0000 Encounter for general adult medical examination without abnormal findings: Secondary | ICD-10-CM | POA: Diagnosis not present

## 2015-01-18 DIAGNOSIS — D649 Anemia, unspecified: Secondary | ICD-10-CM | POA: Diagnosis not present

## 2015-01-18 DIAGNOSIS — E1122 Type 2 diabetes mellitus with diabetic chronic kidney disease: Secondary | ICD-10-CM | POA: Diagnosis not present

## 2015-01-18 DIAGNOSIS — N183 Chronic kidney disease, stage 3 (moderate): Secondary | ICD-10-CM | POA: Diagnosis not present

## 2015-01-18 DIAGNOSIS — I130 Hypertensive heart and chronic kidney disease with heart failure and stage 1 through stage 4 chronic kidney disease, or unspecified chronic kidney disease: Secondary | ICD-10-CM | POA: Diagnosis not present

## 2015-01-30 DIAGNOSIS — H35342 Macular cyst, hole, or pseudohole, left eye: Secondary | ICD-10-CM | POA: Diagnosis not present

## 2015-03-28 DIAGNOSIS — I130 Hypertensive heart and chronic kidney disease with heart failure and stage 1 through stage 4 chronic kidney disease, or unspecified chronic kidney disease: Secondary | ICD-10-CM | POA: Diagnosis not present

## 2015-03-28 DIAGNOSIS — N183 Chronic kidney disease, stage 3 (moderate): Secondary | ICD-10-CM | POA: Diagnosis not present

## 2015-03-28 DIAGNOSIS — N08 Glomerular disorders in diseases classified elsewhere: Secondary | ICD-10-CM | POA: Diagnosis not present

## 2015-03-28 DIAGNOSIS — E1122 Type 2 diabetes mellitus with diabetic chronic kidney disease: Secondary | ICD-10-CM | POA: Diagnosis not present

## 2015-06-05 DIAGNOSIS — H35342 Macular cyst, hole, or pseudohole, left eye: Secondary | ICD-10-CM | POA: Diagnosis not present

## 2015-06-05 DIAGNOSIS — E119 Type 2 diabetes mellitus without complications: Secondary | ICD-10-CM | POA: Diagnosis not present

## 2015-07-04 DIAGNOSIS — N183 Chronic kidney disease, stage 3 (moderate): Secondary | ICD-10-CM | POA: Diagnosis not present

## 2015-07-04 DIAGNOSIS — N08 Glomerular disorders in diseases classified elsewhere: Secondary | ICD-10-CM | POA: Diagnosis not present

## 2015-07-04 DIAGNOSIS — E1122 Type 2 diabetes mellitus with diabetic chronic kidney disease: Secondary | ICD-10-CM | POA: Diagnosis not present

## 2015-07-04 DIAGNOSIS — I509 Heart failure, unspecified: Secondary | ICD-10-CM | POA: Diagnosis not present

## 2015-07-18 DIAGNOSIS — I509 Heart failure, unspecified: Secondary | ICD-10-CM | POA: Diagnosis not present

## 2015-07-18 DIAGNOSIS — E7800 Pure hypercholesterolemia, unspecified: Secondary | ICD-10-CM | POA: Diagnosis not present

## 2015-07-18 DIAGNOSIS — I2781 Cor pulmonale (chronic): Secondary | ICD-10-CM | POA: Diagnosis not present

## 2015-07-18 DIAGNOSIS — I10 Essential (primary) hypertension: Secondary | ICD-10-CM | POA: Diagnosis not present

## 2015-08-01 DIAGNOSIS — I5020 Unspecified systolic (congestive) heart failure: Secondary | ICD-10-CM | POA: Diagnosis not present

## 2015-08-01 DIAGNOSIS — Z86010 Personal history of colonic polyps: Secondary | ICD-10-CM | POA: Diagnosis not present

## 2015-08-01 DIAGNOSIS — G4733 Obstructive sleep apnea (adult) (pediatric): Secondary | ICD-10-CM | POA: Diagnosis not present

## 2015-08-30 DIAGNOSIS — K5730 Diverticulosis of large intestine without perforation or abscess without bleeding: Secondary | ICD-10-CM | POA: Diagnosis not present

## 2015-08-30 DIAGNOSIS — Z1211 Encounter for screening for malignant neoplasm of colon: Secondary | ICD-10-CM | POA: Diagnosis not present

## 2015-08-30 DIAGNOSIS — D123 Benign neoplasm of transverse colon: Secondary | ICD-10-CM | POA: Diagnosis not present

## 2015-08-30 DIAGNOSIS — K635 Polyp of colon: Secondary | ICD-10-CM | POA: Diagnosis not present

## 2015-08-30 DIAGNOSIS — K5520 Angiodysplasia of colon without hemorrhage: Secondary | ICD-10-CM | POA: Diagnosis not present

## 2015-08-30 DIAGNOSIS — Z86010 Personal history of colonic polyps: Secondary | ICD-10-CM | POA: Diagnosis not present

## 2015-10-19 DIAGNOSIS — N183 Chronic kidney disease, stage 3 (moderate): Secondary | ICD-10-CM | POA: Diagnosis not present

## 2015-10-19 DIAGNOSIS — E1122 Type 2 diabetes mellitus with diabetic chronic kidney disease: Secondary | ICD-10-CM | POA: Diagnosis not present

## 2015-10-19 DIAGNOSIS — R21 Rash and other nonspecific skin eruption: Secondary | ICD-10-CM | POA: Diagnosis not present

## 2015-10-19 DIAGNOSIS — I129 Hypertensive chronic kidney disease with stage 1 through stage 4 chronic kidney disease, or unspecified chronic kidney disease: Secondary | ICD-10-CM | POA: Diagnosis not present

## 2016-01-31 DIAGNOSIS — I2781 Cor pulmonale (chronic): Secondary | ICD-10-CM | POA: Diagnosis not present

## 2016-02-05 DIAGNOSIS — I5031 Acute diastolic (congestive) heart failure: Secondary | ICD-10-CM | POA: Diagnosis not present

## 2016-02-05 DIAGNOSIS — I10 Essential (primary) hypertension: Secondary | ICD-10-CM | POA: Diagnosis not present

## 2016-02-05 DIAGNOSIS — E7800 Pure hypercholesterolemia, unspecified: Secondary | ICD-10-CM | POA: Diagnosis not present

## 2016-03-06 DIAGNOSIS — N183 Chronic kidney disease, stage 3 (moderate): Secondary | ICD-10-CM | POA: Diagnosis not present

## 2016-03-06 DIAGNOSIS — E1122 Type 2 diabetes mellitus with diabetic chronic kidney disease: Secondary | ICD-10-CM | POA: Diagnosis not present

## 2016-03-06 DIAGNOSIS — I129 Hypertensive chronic kidney disease with stage 1 through stage 4 chronic kidney disease, or unspecified chronic kidney disease: Secondary | ICD-10-CM | POA: Diagnosis not present

## 2016-03-06 DIAGNOSIS — N08 Glomerular disorders in diseases classified elsewhere: Secondary | ICD-10-CM | POA: Diagnosis not present

## 2016-03-09 DIAGNOSIS — Z23 Encounter for immunization: Secondary | ICD-10-CM | POA: Diagnosis not present

## 2016-06-18 DIAGNOSIS — N08 Glomerular disorders in diseases classified elsewhere: Secondary | ICD-10-CM | POA: Diagnosis not present

## 2016-06-18 DIAGNOSIS — I130 Hypertensive heart and chronic kidney disease with heart failure and stage 1 through stage 4 chronic kidney disease, or unspecified chronic kidney disease: Secondary | ICD-10-CM | POA: Diagnosis not present

## 2016-06-18 DIAGNOSIS — Z1389 Encounter for screening for other disorder: Secondary | ICD-10-CM | POA: Diagnosis not present

## 2016-06-18 DIAGNOSIS — E1122 Type 2 diabetes mellitus with diabetic chronic kidney disease: Secondary | ICD-10-CM | POA: Diagnosis not present

## 2016-06-18 DIAGNOSIS — N183 Chronic kidney disease, stage 3 (moderate): Secondary | ICD-10-CM | POA: Diagnosis not present

## 2016-09-18 DIAGNOSIS — N183 Chronic kidney disease, stage 3 (moderate): Secondary | ICD-10-CM | POA: Diagnosis not present

## 2016-09-18 DIAGNOSIS — I509 Heart failure, unspecified: Secondary | ICD-10-CM | POA: Diagnosis not present

## 2016-09-18 DIAGNOSIS — E1122 Type 2 diabetes mellitus with diabetic chronic kidney disease: Secondary | ICD-10-CM | POA: Diagnosis not present

## 2016-09-18 DIAGNOSIS — M109 Gout, unspecified: Secondary | ICD-10-CM | POA: Diagnosis not present

## 2016-09-18 DIAGNOSIS — N08 Glomerular disorders in diseases classified elsewhere: Secondary | ICD-10-CM | POA: Diagnosis not present

## 2016-09-18 DIAGNOSIS — Z1389 Encounter for screening for other disorder: Secondary | ICD-10-CM | POA: Diagnosis not present

## 2016-10-23 ENCOUNTER — Other Ambulatory Visit: Payer: Self-pay | Admitting: Family

## 2016-10-23 DIAGNOSIS — R779 Abnormality of plasma protein, unspecified: Secondary | ICD-10-CM

## 2016-10-24 ENCOUNTER — Other Ambulatory Visit (HOSPITAL_BASED_OUTPATIENT_CLINIC_OR_DEPARTMENT_OTHER): Payer: Medicare Other

## 2016-10-24 ENCOUNTER — Ambulatory Visit (HOSPITAL_BASED_OUTPATIENT_CLINIC_OR_DEPARTMENT_OTHER): Payer: Medicare Other | Admitting: Family

## 2016-10-24 ENCOUNTER — Encounter: Payer: Self-pay | Admitting: Family

## 2016-10-24 VITALS — BP 132/65 | HR 99 | Temp 98.9°F | Resp 18 | Ht 63.0 in | Wt 256.0 lb

## 2016-10-24 DIAGNOSIS — D573 Sickle-cell trait: Secondary | ICD-10-CM | POA: Diagnosis not present

## 2016-10-24 DIAGNOSIS — Z87891 Personal history of nicotine dependence: Secondary | ICD-10-CM | POA: Diagnosis not present

## 2016-10-24 DIAGNOSIS — D631 Anemia in chronic kidney disease: Secondary | ICD-10-CM

## 2016-10-24 DIAGNOSIS — D509 Iron deficiency anemia, unspecified: Secondary | ICD-10-CM | POA: Diagnosis not present

## 2016-10-24 DIAGNOSIS — R779 Abnormality of plasma protein, unspecified: Secondary | ICD-10-CM | POA: Diagnosis not present

## 2016-10-24 DIAGNOSIS — Z1239 Encounter for other screening for malignant neoplasm of breast: Secondary | ICD-10-CM

## 2016-10-24 DIAGNOSIS — D508 Other iron deficiency anemias: Secondary | ICD-10-CM

## 2016-10-24 LAB — FERRITIN: FERRITIN: 8 ng/mL — AB (ref 9–269)

## 2016-10-24 LAB — CMP (CANCER CENTER ONLY)
ALT(SGPT): 18 U/L (ref 10–47)
AST: 20 U/L (ref 11–38)
Albumin: 3.3 g/dL (ref 3.3–5.5)
Alkaline Phosphatase: 88 U/L — ABNORMAL HIGH (ref 26–84)
BUN: 23 mg/dL — AB (ref 7–22)
CHLORIDE: 100 meq/L (ref 98–108)
CO2: 25 meq/L (ref 18–33)
Calcium: 9.8 mg/dL (ref 8.0–10.3)
Creat: 1.4 mg/dl — ABNORMAL HIGH (ref 0.6–1.2)
Glucose, Bld: 180 mg/dL — ABNORMAL HIGH (ref 73–118)
POTASSIUM: 3.7 meq/L (ref 3.3–4.7)
Sodium: 135 mEq/L (ref 128–145)
Total Bilirubin: 0.6 mg/dl (ref 0.20–1.60)
Total Protein: 7.4 g/dL (ref 6.4–8.1)

## 2016-10-24 LAB — CBC WITH DIFFERENTIAL (CANCER CENTER ONLY)
BASO#: 0.1 10*3/uL (ref 0.0–0.2)
BASO%: 0.6 % (ref 0.0–2.0)
EOS ABS: 0.4 10*3/uL (ref 0.0–0.5)
EOS%: 2.9 % (ref 0.0–7.0)
HCT: 32.3 % — ABNORMAL LOW (ref 34.8–46.6)
HGB: 9.9 g/dL — ABNORMAL LOW (ref 11.6–15.9)
LYMPH#: 2.1 10*3/uL (ref 0.9–3.3)
LYMPH%: 17 % (ref 14.0–48.0)
MCH: 19.6 pg — AB (ref 26.0–34.0)
MCHC: 30.7 g/dL — AB (ref 32.0–36.0)
MCV: 64 fL — AB (ref 81–101)
MONO#: 1.1 10*3/uL — AB (ref 0.1–0.9)
MONO%: 9.2 % (ref 0.0–13.0)
NEUT#: 8.6 10*3/uL — ABNORMAL HIGH (ref 1.5–6.5)
NEUT%: 70.3 % (ref 39.6–80.0)
RBC: 5.06 10*6/uL (ref 3.70–5.32)
RDW: 18.2 % — AB (ref 11.1–15.7)
WBC: 12.2 10*3/uL — ABNORMAL HIGH (ref 3.9–10.0)

## 2016-10-24 LAB — IRON AND TIBC
%SAT: 5 % — AB (ref 21–57)
IRON: 20 ug/dL — AB (ref 41–142)
TIBC: 414 ug/dL (ref 236–444)
UIBC: 394 ug/dL — AB (ref 120–384)

## 2016-10-24 LAB — TECHNOLOGIST REVIEW CHCC SATELLITE

## 2016-10-24 LAB — LACTATE DEHYDROGENASE: LDH: 176 U/L (ref 125–245)

## 2016-10-24 LAB — CHCC SATELLITE - SMEAR

## 2016-10-24 MED ORDER — FOLIC ACID 1 MG PO TABS: 1.0000 mg | ORAL_TABLET | Freq: Every day | ORAL | 11 refills | Status: DC

## 2016-10-24 NOTE — Progress Notes (Signed)
Hematology/Oncology Consultation   Name: Anna Clay      MRN: [id number]    Location: Room/bed info not found  Date: 10/24/2016 Time:9:04 AM   REFERRING PHYSICIAN: Minette Brine, FNP-BC  REASON FOR CONSULT: Abnormality of plasma protein    DIAGNOSIS:  Iron deficiency anemia  Abnormal protein present - M-spike 0.4   HISTORY OF PRESENT ILLNESS: Anna Clay is a very pleasant 66 yo [place] American female with recent labs with an M-spike of 0.4. Her Hgb is 9.9 with an MCV of 64. Her platelet count is 348. Her iron saturation was 5% with a ferritin of 8.  She is symptomatic with fatigue and chewing lots of ice.  She has had no episodes of bleeding, bruising or petechiae. No lymphadenopathy found on exam.  She states that her family, including herself, are all carriers of the sickle cell trait. She has one nephew with sickle cell disease. Her brother had thalassemia anemia.  No personal cancer history. Family history includes sister with lung cancer (smoker) and a brother with bone cancer. He had been in [place] during the 60's and was present on a ship that witnessed the testing of atomic bombs exposed to all that radiation.   She has 1 daughter and history of 3 miscarriages.  She had a partial hysterectomy in 1993. She was on premarin for 5 years and then [place] for 3 years stopping in 1998.  She has had no problem with frequent infections. No fever, chills, n/v, cough, rash, dizziness, SOB, chest pain, palpitations, abdominal pain or changes in bowel or bladder habits.  No swelling, numbness or tingling in her extremities. She has generalized aches and pains due to arthritis. She uses a cane when ambulating.  She has maintained a good appetite and is staying well hydrated. Her weight is stable.  Her diabetes is not well controlled. She is on Novolog.  She will have a headache occasionally due to HTN.  She had her colonoscopy earlier this year and had 5 benign polyps removed. She will be due  again in 3 years.  She is due for her mammogram.  She was a 1 ppd smoker but quit 6 years ago. She only has the occasional drink of [employer] socially.  She is a retired Orthoptist. She loves to read.   ROS: All other 10 point review of systems is negative.   PAST MEDICAL HISTORY:   Past Medical History:  Diagnosis Date  . Chest pain   . Diabetes mellitus with kidney disease ([place])   . Diastolic heart failure ([place])   . Gout   . Hypercholesteremia   . Hypertension   . Obesity   . OSA (obstructive sleep apnea)     ALLERGIES: Allergies  Allergen Reactions  . Ivp Dye [Iodinated Diagnostic Agents] Other (See Comments)    Reaction unknown  . Tetracyclines & Related Other (See Comments)    Reaction unknown      MEDICATIONS:  Current Outpatient Prescriptions on File Prior to Visit  Medication Sig Dispense Refill  . acetaminophen (TYLENOL) 650 MG CR tablet Take 1,300 mg by mouth every 8 (eight) hours as needed for pain.    Marland Kitchen amLODipine (NORVASC) 5 MG tablet Take 5 mg by mouth daily.    Marland Kitchen aspirin EC 81 MG tablet Take 81 mg by mouth daily.    . Cholecalciferol (VITAMIN D) 2000 UNITS CAPS Take 1 capsule by mouth daily.    . Flaxseed, Linseed, (FLAXSEED OIL MAX STR)  1300 MG CAPS Take 1 capsule by mouth daily.    . insulin aspart (NOVOLOG) 100 UNIT/ML FlexPen Sliding scale w/ each meal    . metoprolol succinate (TOPROL-XL) 12.5 mg TB24 24 hr tablet Take 0.5 tablets (12.5 mg total) by mouth daily. 30 tablet 0  . mometasone (NASONEX) 50 MCG/ACT nasal spray Place 2 sprays into the nose daily. 17 g 0  . Multiple Vitamin (MULTIVITAMIN WITH MINERALS) TABS tablet Take 1 tablet by mouth daily.    . nitroGLYCERIN (NITROSTAT) 0.4 MG SL tablet Place 0.4 mg under the tongue every 5 (five) minutes as needed for chest pain.    . Pitavastatin Calcium 4 MG TABS Take 4 mg by mouth daily.    . ranitidine (ZANTAC) 150 MG tablet Take 150 mg by mouth daily as needed for heartburn.    .  simvastatin (ZOCOR) 20 MG tablet Take 20 mg by mouth daily.    Marland Kitchen spironolactone (ALDACTONE) 25 MG tablet Take 1 tablet (25 mg total) by mouth daily. 30 tablet 0  . STUDY MEDICATION Take 2 capsules by mouth daily. Take 1 capsule from each bottle ([id number] and [id number]) every morning. [employer]aked[employer] Development, Protocol No. [id number]. Study of Febuxostat, Allopurinol, or Placebo.    . torsemide (DEMADEX) 20 MG tablet Take 2 tablets (40 mg total) by mouth daily. 30 tablet 0   No current facility-administered medications on file prior to visit.      PAST SURGICAL HISTORY Past Surgical History:  Procedure Laterality Date  . ABDOMINAL HYSTERECTOMY    . UVULOPALATOPHARYNGOPLASTY (UPPP)/TONSILLECTOMY/SEPTOPLASTY  01/25/2008   By Minna Merritts    FAMILY HISTORY: Family History  Problem Relation Age of Onset  . Heart disease Father   . Heart disease Sister   . Cancer - Lung Sister   . Sickle cell trait Unknown        all family members  . Cancer Brother        bone  . Diabetes Brother   . Diabetes Sister     SOCIAL HISTORY:  reports that she quit smoking about 3 years ago. Her smoking use included Cigarettes. She has a 43.00 pack-year smoking history. She has never used smokeless tobacco. She reports that she drinks alcohol. She reports that she does not use drugs.  PERFORMANCE STATUS: The patient's performance status is 1 - Symptomatic but completely ambulatory  PHYSICAL EXAM: Most Recent Vital Signs: Blood pressure 132/65, pulse 99, temperature 98.9 F (37.2 C), temperature source Oral, resp. rate 18, height 5\' 3"  (1.6 m), weight 256 lb (116.1 kg), SpO2 97 %. BP 132/65 (BP Location: Left Arm, Patient Position: Sitting)   Pulse 99   Temp 98.9 F (37.2 C) (Oral)   Resp 18   Ht 5\' 3"  (1.6 m)   Wt 256 lb (116.1 kg)   SpO2 97%   BMI 45.35 kg/m   General Appearance:    Alert, cooperative, no distress, appears stated age  Head:    Normocephalic, without obvious abnormality, atraumatic   Eyes:    PERRL, conjunctiva/corneas clear, EOM's intact, fundi    benign, both eyes        Throat:   Lips, mucosa, and tongue normal; teeth and gums normal  Neck:   Supple, symmetrical, trachea midline, no adenopathy;    thyroid:  no enlargement/tenderness/nodules; no carotid   bruit or JVD  Back:     Symmetric, no curvature, ROM normal, no CVA tenderness  Lungs:     Clear to auscultation bilaterally, respirations unlabored  Chest Wall:    No tenderness or deformity   Heart:    Regular rate and rhythm, S1 and S2 normal, no murmur, rub   or gallop     Abdomen:     Soft, non-tender, bowel sounds active all four quadrants,    no masses, no organomegaly        Extremities:   Extremities normal, atraumatic, no cyanosis or edema  Pulses:   2+ and symmetric all extremities  Skin:   Skin color, texture, turgor normal, no rashes or lesions  Lymph nodes:   Cervical, supraclavicular, and axillary nodes normal  Neurologic:   CNII-XII intact, normal strength, sensation and reflexes    throughout    LABORATORY DATA:  Results for orders placed or performed in visit on 10/24/16 (from the past 48 hour(s))  CBC w/Diff     Status: Abnormal   Collection Time: 10/24/16  8:30 AM  Result Value Ref Range   WBC 12.2 (H) 3.9 - 10.0 10e3/uL   RBC 5.06 3.70 - 5.32 10e6/uL   HGB 9.9 (L) 11.6 - 15.9 g/dL   HCT 32.3 (L) 34.8 - 46.6 %   MCV 64 (L) 81 - 101 fL   MCH 19.6 (L) 26.0 - 34.0 pg   MCHC 30.7 (L) 32.0 - 36.0 g/dL   RDW 18.2 (H) 11.1 - 15.7 %   Platelets 348 Large platelets present 145 - 400 10e3/uL   NEUT# 8.6 (H) 1.5 - 6.5 10e3/uL   LYMPH# 2.1 0.9 - 3.3 10e3/uL   MONO# 1.1 (H) 0.1 - 0.9 10e3/uL   Eosinophils Absolute 0.4 0.0 - 0.5 10e3/uL   BASO# 0.1 0.0 - 0.2 10e3/uL   NEUT% 70.3 39.6 - 80.0 %   LYMPH% 17.0 14.0 - 48.0 %   MONO% 9.2 0.0 - 13.0 %   EOS% 2.9 0.0 - 7.0 %   BASO% 0.6 0.0 - 2.0 %  Smear     Status: None   Collection Time: 10/24/16  8:30 AM  Result Value Ref Range   Smear  Result Smear Available   TECHNOLOGIST REVIEW [facility]     Status: None   Collection Time: 10/24/16  8:30 AM  Result Value Ref Range   Tech Review mod ovalocytes. few targets       RADIOGRAPHY: No results found.     PATHOLOGY: None   ASSESSMENT/PLAN: Anna Clay is a very pleasant 66 yo [place] American female with recent labs showing an M-spike of 0.4.  She is iron deficient with an iron saturation of 5% with a ferritin of 8. We will plan to give her 2 doses of iron with the first next week.  She also states that she has the sickle cell trait. We will check this at her next appointment.  Her diabetes is not well controlled. We will get an Epo level at her next visit as well.  She has not had a mammogram of chest xray in several years so we will get these scheduled.  We will see what today's protein studies show but hopefully this is simply reactive. Her blood smear did not reveal any evidence of malinganacy.  We will go ahead and plan to see her back in another 6 weeks for follow-up and labs.   All questions were answered and she is in agreement with the plan. She will contact our office with any questions or concerns. We can certainly see her sooner if necessary.  She was discussed with and also seen by Dr.  Ennever and he is in agreement with the aforementioned.   [facility] M     Addendum:   I saw and examined Anna Clay with Judson Roch. I agree with the above assessment.  I really not too worried about the M spike. This is very minimal. I suppose she probably has a MGUS. She has multiple health issues.  Her immunoglobulin levels are normal. This would go against a plasma cell disorder.  She does have sickle cell trait. We need to make sure that she is on folic acid.  I am most worried about her anemia. She clearly is markedly higher deficient. Her ferritin is only 8 with an iron saturation of 5%. This I think is her biggest problem.  I think she had a colonoscopy  earlier this year. We will have to check on this.  I looked at her blood smear. I really do not see anything on the blood smear that looked suspicious for a malignancy. I saw nothing that looked like a myeloproliferative disorder. She just has markedly hypochromic red blood cells. They are microcytic. This is all consistent with iron deficiency.  We spent about 40 minutes with her. We answered all of her questions. She felt much better after her appointment with [place]. She was quite nervous because she was going to a cancer center.  We will plan to get her back in another 6 weeks. She probably will need to doses of IV iron.  Lattie Haw, MD

## 2016-10-25 LAB — IGG, IGA, IGM
IgA, Qn, Serum: 160 mg/dL (ref 87–352)
IgG, Qn, Serum: 1236 mg/dL (ref 700–1600)
IgM, Qn, Serum: 55 mg/dL (ref 26–217)

## 2016-10-27 ENCOUNTER — Other Ambulatory Visit: Payer: Self-pay | Admitting: Family

## 2016-10-27 LAB — KAPPA/LAMBDA LIGHT CHAINS
IG KAPPA FREE LIGHT CHAIN: 27.4 mg/L — AB (ref 3.3–19.4)
Ig Lambda Free Light Chain: 60.9 mg/L — ABNORMAL HIGH (ref 5.7–26.3)
Kappa/Lambda FluidC Ratio: 0.45 (ref 0.26–1.65)

## 2016-10-28 ENCOUNTER — Other Ambulatory Visit (HOSPITAL_BASED_OUTPATIENT_CLINIC_OR_DEPARTMENT_OTHER): Payer: Medicare Other

## 2016-10-28 LAB — PROTEIN ELECTROPHORESIS, SERUM
A/G Ratio: 0.8 (ref 0.7–1.7)
ALBUMIN: 3.1 g/dL (ref 2.9–4.4)
ALPHA 1: 0.2 g/dL (ref 0.0–0.4)
ALPHA 2: 0.8 g/dL (ref 0.4–1.0)
BETA: 1.2 g/dL (ref 0.7–1.3)
GAMMA GLOBULIN: 1.5 g/dL (ref 0.4–1.8)
Globulin, Total: 3.7 g/dL (ref 2.2–3.9)
M-Spike, %: 0.4 g/dL — ABNORMAL HIGH
Total Protein: 6.8 g/dL (ref 6.0–8.5)

## 2016-11-03 ENCOUNTER — Ambulatory Visit (HOSPITAL_BASED_OUTPATIENT_CLINIC_OR_DEPARTMENT_OTHER)
Admission: RE | Admit: 2016-11-03 | Discharge: 2016-11-03 | Disposition: A | Discharge status: Home / Self Care | Payer: Medicare Other | Source: Ambulatory Visit | Attending: Family | Admitting: Family

## 2016-11-03 ENCOUNTER — Ambulatory Visit (HOSPITAL_BASED_OUTPATIENT_CLINIC_OR_DEPARTMENT_OTHER): Payer: Medicare Other

## 2016-11-03 VITALS — BP 117/62 | HR 93 | Resp 20

## 2016-11-03 DIAGNOSIS — R918 Other nonspecific abnormal finding of lung field: Secondary | ICD-10-CM | POA: Diagnosis not present

## 2016-11-03 DIAGNOSIS — Z87891 Personal history of nicotine dependence: Secondary | ICD-10-CM | POA: Insufficient documentation

## 2016-11-03 DIAGNOSIS — D509 Iron deficiency anemia, unspecified: Secondary | ICD-10-CM | POA: Diagnosis present

## 2016-11-03 DIAGNOSIS — R0602 Shortness of breath: Secondary | ICD-10-CM | POA: Diagnosis not present

## 2016-11-03 DIAGNOSIS — D508 Other iron deficiency anemias: Secondary | ICD-10-CM

## 2016-11-03 MED ORDER — FERUMOXYTOL INJECTION 510 MG/17 ML
510.0000 mg | Freq: Once | INTRAVENOUS | Status: AC
  Administered 2016-11-03: 510 mg via INTRAVENOUS
  Filled 2016-11-03: qty 17

## 2016-11-03 MED ORDER — SODIUM CHLORIDE 0.9 % IV SOLN
Freq: Once | INTRAVENOUS | Status: AC
Start: 2016-11-03 — End: 2016-11-03
  Administered 2016-11-03: 12:00:00 via INTRAVENOUS

## 2016-11-03 NOTE — Patient Instructions (Signed)

## 2016-11-10 ENCOUNTER — Ambulatory Visit (HOSPITAL_BASED_OUTPATIENT_CLINIC_OR_DEPARTMENT_OTHER): Payer: Medicare Other

## 2016-11-10 VITALS — BP 136/67 | HR 87 | Temp 98.6°F | Resp 18

## 2016-11-10 DIAGNOSIS — D509 Iron deficiency anemia, unspecified: Secondary | ICD-10-CM

## 2016-11-10 DIAGNOSIS — D508 Other iron deficiency anemias: Secondary | ICD-10-CM

## 2016-11-10 MED ORDER — FERUMOXYTOL INJECTION 510 MG/17 ML
510.0000 mg | Freq: Once | INTRAVENOUS | Status: AC
  Administered 2016-11-10: 510 mg via INTRAVENOUS
  Filled 2016-11-10: qty 17

## 2016-11-10 MED ORDER — SODIUM CHLORIDE 0.9 % IV SOLN
Freq: Once | INTRAVENOUS | Status: AC
  Administered 2016-11-10: 12:00:00 via INTRAVENOUS

## 2016-11-10 NOTE — Patient Instructions (Signed)

## 2016-12-05 ENCOUNTER — Ambulatory Visit (HOSPITAL_BASED_OUTPATIENT_CLINIC_OR_DEPARTMENT_OTHER): Payer: Medicare Other | Admitting: Family

## 2016-12-05 ENCOUNTER — Other Ambulatory Visit: Payer: Self-pay

## 2016-12-05 ENCOUNTER — Other Ambulatory Visit (HOSPITAL_BASED_OUTPATIENT_CLINIC_OR_DEPARTMENT_OTHER): Payer: Medicare Other

## 2016-12-05 ENCOUNTER — Encounter: Payer: Self-pay | Admitting: Family

## 2016-12-05 VITALS — BP 135/68 | HR 82 | Temp 98.0°F | Wt 259.4 lb

## 2016-12-05 DIAGNOSIS — D631 Anemia in chronic kidney disease: Secondary | ICD-10-CM

## 2016-12-05 DIAGNOSIS — D472 Monoclonal gammopathy: Secondary | ICD-10-CM

## 2016-12-05 DIAGNOSIS — D509 Iron deficiency anemia, unspecified: Secondary | ICD-10-CM | POA: Diagnosis not present

## 2016-12-05 DIAGNOSIS — D573 Sickle-cell trait: Secondary | ICD-10-CM | POA: Diagnosis not present

## 2016-12-05 DIAGNOSIS — D508 Other iron deficiency anemias: Secondary | ICD-10-CM

## 2016-12-05 LAB — COMPREHENSIVE METABOLIC PANEL
ALBUMIN: 3.4 g/dL — AB (ref 3.5–5.0)
ALK PHOS: 77 U/L (ref 40–150)
ALT: 14 U/L (ref 0–55)
AST: 16 U/L (ref 5–34)
Anion Gap: 10 mEq/L (ref 3–11)
BUN: 21.8 mg/dL (ref 7.0–26.0)
CALCIUM: 10 mg/dL (ref 8.4–10.4)
CO2: 26 mEq/L (ref 22–29)
CREATININE: 1.4 mg/dL — AB (ref 0.6–1.1)
Chloride: 100 mEq/L (ref 98–109)
EGFR: 46 mL/min/{1.73_m2} — ABNORMAL LOW (ref >60)
Glucose: 198 mg/dl — ABNORMAL HIGH (ref 70–140)
POTASSIUM: 4.3 meq/L (ref 3.5–5.1)
Sodium: 135 mEq/L — ABNORMAL LOW (ref 136–145)
TOTAL PROTEIN: 7.3 g/dL (ref 6.4–8.3)
Total Bilirubin: 0.39 mg/dL (ref 0.20–1.20)

## 2016-12-05 LAB — CBC WITH DIFFERENTIAL (CANCER CENTER ONLY)
BASO#: 0.1 10*3/uL (ref 0.0–0.2)
BASO%: 1 % (ref 0.0–2.0)
EOS ABS: 0.3 10*3/uL (ref 0.0–0.5)
EOS%: 3.9 % (ref 0.0–7.0)
HEMATOCRIT: 39.3 % (ref 34.8–46.6)
HEMOGLOBIN: 12.5 g/dL (ref 11.6–15.9)
LYMPH#: 1.9 10*3/uL (ref 0.9–3.3)
LYMPH%: 21.2 % (ref 14.0–48.0)
MCH: 21.7 pg — AB (ref 26.0–34.0)
MCHC: 31.8 g/dL — ABNORMAL LOW (ref 32.0–36.0)
MCV: 68 fL — AB (ref 81–101)
MONO#: 0.7 10*3/uL (ref 0.1–0.9)
MONO%: 7.7 % (ref 0.0–13.0)
NEUT%: 66.2 % (ref 39.6–80.0)
NEUTROS ABS: 5.8 10*3/uL (ref 1.5–6.5)
PLATELETS: 211 10*3/uL (ref 145–400)
RBC: 5.77 10*6/uL — AB (ref 3.70–5.32)
RDW: 23.2 % — ABNORMAL HIGH (ref 11.1–15.7)
WBC: 8.7 10*3/uL (ref 3.9–10.0)

## 2016-12-05 LAB — IRON AND TIBC
%SAT: 13 % — AB (ref 21–57)
Iron: 43 ug/dL (ref 41–142)
TIBC: 320 ug/dL (ref 236–444)
UIBC: 277 ug/dL (ref 120–384)

## 2016-12-05 LAB — FERRITIN: FERRITIN: 61 ng/mL (ref 9–269)

## 2016-12-05 LAB — TECHNOLOGIST REVIEW CHCC SATELLITE

## 2016-12-05 NOTE — Progress Notes (Signed)
Hematology and Oncology Follow Up Visit  Anna Clay [id number] 10/11/1950 66 y.o. 12/05/2016   Principle Diagnosis:  Iron deficiency anemia  Sickle cell trait Abnormal protein present - M-spike 0.4   Current Therapy:   IV iron as indicated  Folic acid 1 mg PO daily    Interim History:  Anna Clay is here today for follow-up. She received 2 doses of IV iron last month and has responded nicely. She states that her energy is much better and she is not chewing ice as often.  No fever, chills, n/v, cough, rash, dizziness, SOB, chest pain, palpitations, abdominal pain or changes in bowel or bladder habits.  No bleeding, bruising or petechiae. No lymphadenopathy found on exam.  No swelling or tenderness in her extremities. She has occasional numbness and tingling in her hands due to carpal tunnel syndrome.  She has generalized arthritic pain in her lower back and hips due to arthritis and the recent weather changes.  She has maintained a good appetite and is staying well hydrated. Her weight is stable.   ECOG Performance Status: 1 - Symptomatic but completely ambulatory  Medications:  Allergies as of 12/05/2016      Reactions   Ivp Dye [iodinated Diagnostic Agents] Other (See Comments)   Reaction unknown   Tetracyclines & Related Other (See Comments)   Reaction unknown      Medication List        Accurate as of 12/05/16  9:15 AM. Always use your most recent med list.          acetaminophen 650 MG CR tablet Commonly known as:  TYLENOL Take 1,300 mg by mouth every 8 (eight) hours as needed for pain.   amLODipine 5 MG tablet Commonly known as:  NORVASC Take 5 mg by mouth daily.   aspirin EC 81 MG tablet Take 81 mg by mouth daily.   FLAXSEED OIL MAX STR 1300 MG Caps Take 1 capsule by mouth daily.   folic acid 1 MG tablet Commonly known as:  FOLVITE Take 1 tablet (1 mg total) by mouth daily.   insulin aspart 100 UNIT/ML FlexPen Commonly known as:  NOVOLOG Sliding  scale w/ each meal   Investigational - Study Medication Take 2 capsules by mouth daily. Take 1 capsule from each bottle ([id number] and [id number]) every morning. [employer]aked[employer], Protocol No. [id number]. Study of Febuxostat, Allopurinol, or Placebo.   metoprolol succinate 12.5 mg Tb24 24 hr tablet Commonly known as:  TOPROL-XL Take 0.5 tablets (12.5 mg total) by mouth daily.   mometasone 50 MCG/ACT nasal spray Commonly known as:  NASONEX Place 2 sprays into the nose daily.   multivitamin with minerals Tabs tablet Take 1 tablet by mouth daily.   nitroGLYCERIN 0.4 MG SL tablet Commonly known as:  NITROSTAT Place 0.4 mg under the tongue every 5 (five) minutes as needed for chest pain.   NOVOFINE 32G X 6 MM Misc Generic drug:  Insulin Pen Needle Inject 1 applicator every morning into the skin.   Pitavastatin Calcium 4 MG Tabs Take 4 mg by mouth daily.   ranitidine 150 MG tablet Commonly known as:  ZANTAC Take 150 mg by mouth daily as needed for heartburn.   simvastatin 20 MG tablet Commonly known as:  ZOCOR Take 20 mg by mouth daily.   spironolactone 25 MG tablet Commonly known as:  ALDACTONE Take 1 tablet (25 mg total) by mouth daily.   torsemide 20 MG tablet Commonly known as:  DEMADEX Take 2 tablets (  40 mg total) by mouth daily.   TOUJEO SOLOSTAR 300 UNIT/ML Sopn Generic drug:  Insulin Glargine Inject 1 Units every morning into the skin.   Vitamin D 2000 units Caps Take 1 capsule by mouth daily.       Allergies:  Allergies  Allergen Reactions  . Ivp Dye [Iodinated Diagnostic Agents] Other (See Comments)    Reaction unknown  . Tetracyclines & Related Other (See Comments)    Reaction unknown    Past Medical History, Surgical history, Social history, and Family History were reviewed and updated.  Review of Systems: All other 10 point review of systems is negative.   Physical Exam:  weight is 259 lb 6.4 oz (117.7 kg). Her oral temperature is 98 F (36.7  C). Her blood pressure is 135/68 and her pulse is 82. Her oxygen saturation is 93%.   Wt Readings from Last 3 Encounters:  12/05/16 259 lb 6.4 oz (117.7 kg)  10/24/16 256 lb (116.1 kg)  06/05/14 236 lb 3.2 oz (107.1 kg)    Ocular: Sclerae unicteric, pupils equal, round and reactive to light Ear-nose-throat: Oropharynx clear, dentition fair Lymphatic: No cervical, supraclavicular or axillary adenopathy Lungs no rales or rhonchi, good excursion bilaterally Heart regular rate and rhythm, no murmur appreciated Abd soft, nontender, positive bowel sounds, no liver or spleen tip palpated on exam, no fluid wave  MSK no focal spinal tenderness, no joint edema Neuro: non-focal, well-oriented, appropriate affect Breasts: Deferred   Lab Results  Component Value Date   WBC 8.7 12/05/2016   HGB 12.5 12/05/2016   HCT 39.3 12/05/2016   MCV 68 (L) 12/05/2016   PLT 211 12/05/2016   Lab Results  Component Value Date   FERRITIN 8 (L) 10/24/2016   IRON 20 (L) 10/24/2016   TIBC 414 10/24/2016   UIBC 394 (H) 10/24/2016   IRONPCTSAT 5 (L) 10/24/2016   Lab Results  Component Value Date   RETICCTPCT 2.3 10/06/2013   RBC 5.77 (H) 12/05/2016   Lab Results  Component Value Date   KAPLAMBRATIO 0.45 10/24/2016   Lab Results  Component Value Date   IGGSERUM 1,236 10/24/2016   IGMSERUM 55 10/24/2016   Lab Results  Component Value Date   MSPIKE 0.4 (H) 10/24/2016     Chemistry      Component Value Date/Time   NA 135 10/24/2016 0830   K 3.7 10/24/2016 0830   CL 100 10/24/2016 0830   CO2 25 10/24/2016 0830   BUN 23 (H) 10/24/2016 0830   CREATININE 1.4 (H) 10/24/2016 0830      Component Value Date/Time   CALCIUM 9.8 10/24/2016 0830   ALKPHOS 88 (H) 10/24/2016 0830   AST 20 10/24/2016 0830   ALT 18 10/24/2016 0830   BILITOT 0.60 10/24/2016 0830      Impression and Plan: Anna Clay is a very pleasant 66 yo African American female with iron deficiency anemia, sickle cell trait and  MGUS.  She has responded nicely to the IV iron she received last month and is feeling much better.  We will see what her iron studies show and bring her back in next week for infusion if needed.  We will plan to recheck her protein studies at her next follow-up in 2 months.  She is taking her folic acid daily as prescribed.  She will contact our office with any questions or concerns. We can certainly see her sooner if need be.   Eliezer Bottom, NP 11/16/20189:15 AM

## 2016-12-06 LAB — RETICULOCYTES: RETICULOCYTE COUNT: 2.1 % (ref 0.6–2.6)

## 2016-12-06 LAB — ERYTHROPOIETIN: ERYTHROPOIETIN: 51.5 m[IU]/mL — AB (ref 2.6–18.5)

## 2016-12-08 LAB — HEMOGLOBINOPATHY EVALUATION
HEMOGLOBIN F QUANTITATION: 4.1 % — AB (ref 0.0–2.0)
HGB A: 91.1 % — AB (ref 96.4–98.8)
HGB C: 0 %
HGB S: 0 %
HGB VARIANT: 0 %
Hemoglobin A2 Quantitation: 4.8 % — ABNORMAL HIGH (ref 1.8–3.2)

## 2016-12-16 DIAGNOSIS — N183 Chronic kidney disease, stage 3 (moderate): Secondary | ICD-10-CM | POA: Diagnosis not present

## 2016-12-16 DIAGNOSIS — E1122 Type 2 diabetes mellitus with diabetic chronic kidney disease: Secondary | ICD-10-CM | POA: Diagnosis not present

## 2016-12-16 DIAGNOSIS — N08 Glomerular disorders in diseases classified elsewhere: Secondary | ICD-10-CM | POA: Diagnosis not present

## 2016-12-16 DIAGNOSIS — I129 Hypertensive chronic kidney disease with stage 1 through stage 4 chronic kidney disease, or unspecified chronic kidney disease: Secondary | ICD-10-CM | POA: Diagnosis not present

## 2016-12-16 DIAGNOSIS — M109 Gout, unspecified: Secondary | ICD-10-CM | POA: Diagnosis not present

## 2017-01-02 DIAGNOSIS — E1165 Type 2 diabetes mellitus with hyperglycemia: Secondary | ICD-10-CM | POA: Diagnosis not present

## 2017-01-02 DIAGNOSIS — Z1231 Encounter for screening mammogram for malignant neoplasm of breast: Secondary | ICD-10-CM | POA: Diagnosis not present

## 2017-02-06 ENCOUNTER — Inpatient Hospital Stay: Payer: Medicare Other

## 2017-02-06 ENCOUNTER — Inpatient Hospital Stay: Payer: Medicare Other | Attending: Family | Admitting: Hematology & Oncology

## 2017-03-18 DIAGNOSIS — E1122 Type 2 diabetes mellitus with diabetic chronic kidney disease: Secondary | ICD-10-CM | POA: Diagnosis not present

## 2017-03-18 DIAGNOSIS — Z79899 Other long term (current) drug therapy: Secondary | ICD-10-CM | POA: Diagnosis not present

## 2017-03-18 DIAGNOSIS — N183 Chronic kidney disease, stage 3 (moderate): Secondary | ICD-10-CM | POA: Diagnosis not present

## 2017-03-18 DIAGNOSIS — N08 Glomerular disorders in diseases classified elsewhere: Secondary | ICD-10-CM | POA: Diagnosis not present

## 2017-03-18 DIAGNOSIS — I130 Hypertensive heart and chronic kidney disease with heart failure and stage 1 through stage 4 chronic kidney disease, or unspecified chronic kidney disease: Secondary | ICD-10-CM | POA: Diagnosis not present

## 2017-07-01 DIAGNOSIS — E1122 Type 2 diabetes mellitus with diabetic chronic kidney disease: Secondary | ICD-10-CM | POA: Diagnosis not present

## 2017-07-01 DIAGNOSIS — N183 Chronic kidney disease, stage 3 (moderate): Secondary | ICD-10-CM | POA: Diagnosis not present

## 2017-07-01 DIAGNOSIS — M109 Gout, unspecified: Secondary | ICD-10-CM | POA: Diagnosis not present

## 2017-07-01 DIAGNOSIS — I130 Hypertensive heart and chronic kidney disease with heart failure and stage 1 through stage 4 chronic kidney disease, or unspecified chronic kidney disease: Secondary | ICD-10-CM | POA: Diagnosis not present

## 2017-07-01 DIAGNOSIS — N08 Glomerular disorders in diseases classified elsewhere: Secondary | ICD-10-CM | POA: Diagnosis not present

## 2017-07-28 ENCOUNTER — Telehealth: Payer: Self-pay | Admitting: Adult Health

## 2017-07-28 NOTE — Telephone Encounter (Signed)
Pt has not been seen at office since 06/05/14. Due to this, pt would have to start over as a new consult pt to get reestablished at office.  Called Melissa with [facility] stating this information to her. Melissa expressed understanding. Nothing further needed.

## 2017-08-20 ENCOUNTER — Other Ambulatory Visit: Payer: Self-pay

## 2017-10-01 DIAGNOSIS — I5020 Unspecified systolic (congestive) heart failure: Secondary | ICD-10-CM | POA: Diagnosis not present

## 2017-10-01 DIAGNOSIS — Z79899 Other long term (current) drug therapy: Secondary | ICD-10-CM | POA: Diagnosis not present

## 2017-10-01 DIAGNOSIS — N183 Chronic kidney disease, stage 3 (moderate): Secondary | ICD-10-CM | POA: Diagnosis not present

## 2017-10-01 DIAGNOSIS — E1122 Type 2 diabetes mellitus with diabetic chronic kidney disease: Secondary | ICD-10-CM | POA: Diagnosis not present

## 2017-10-01 DIAGNOSIS — N08 Glomerular disorders in diseases classified elsewhere: Secondary | ICD-10-CM | POA: Diagnosis not present

## 2017-10-01 DIAGNOSIS — Z6841 Body Mass Index (BMI) 40.0 and over, adult: Secondary | ICD-10-CM | POA: Diagnosis not present

## 2017-10-01 DIAGNOSIS — I130 Hypertensive heart and chronic kidney disease with heart failure and stage 1 through stage 4 chronic kidney disease, or unspecified chronic kidney disease: Secondary | ICD-10-CM | POA: Diagnosis not present

## 2017-10-01 DIAGNOSIS — E559 Vitamin D deficiency, unspecified: Secondary | ICD-10-CM | POA: Diagnosis not present

## 2017-10-26 ENCOUNTER — Other Ambulatory Visit: Payer: Self-pay | Admitting: Family

## 2017-10-26 DIAGNOSIS — D573 Sickle-cell trait: Secondary | ICD-10-CM

## 2017-10-26 DIAGNOSIS — D509 Iron deficiency anemia, unspecified: Secondary | ICD-10-CM

## 2017-10-26 DIAGNOSIS — D631 Anemia in chronic kidney disease: Secondary | ICD-10-CM

## 2017-12-01 ENCOUNTER — Other Ambulatory Visit: Payer: Self-pay | Admitting: Internal Medicine

## 2017-12-02 ENCOUNTER — Other Ambulatory Visit: Payer: Self-pay

## 2017-12-02 MED ORDER — INSULIN GLARGINE (1 UNIT DIAL) 300 UNIT/ML ~~LOC~~ SOPN: 62.0000 [IU] | PEN_INJECTOR | Freq: Every day | SUBCUTANEOUS | 1 refills | Status: DC

## 2017-12-04 ENCOUNTER — Other Ambulatory Visit: Payer: Self-pay

## 2017-12-04 MED ORDER — GLUCOSE BLOOD VI STRP: ORAL_STRIP | 11 refills | Status: DC

## 2018-01-07 ENCOUNTER — Ambulatory Visit: Payer: Self-pay

## 2018-01-27 DIAGNOSIS — Z23 Encounter for immunization: Secondary | ICD-10-CM | POA: Diagnosis not present

## 2018-02-02 ENCOUNTER — Telehealth: Payer: Self-pay | Admitting: Internal Medicine

## 2018-02-02 NOTE — Telephone Encounter (Signed)
Called to schedule Medicare Annual Wellness Visit with the Nurse Health Advisor. Message states call can not be completed at this time.  If patient returns call, please note: their last AWV was on 01/02/17 please schedule AWV with [facility] any date AFTER 01/02/2018.  Patient also needs an office visit scheduled with one of the providers. Thank you! For any questions please contact: Janace Hoard at [phone] or Skype [email or web address]

## 2018-02-03 NOTE — Telephone Encounter (Signed)
ATT TO CONTACT PT TO GET APPT SCHEDULED , NO ANS LVM FOR PT TO CALL OFC TO SCHEDULE. NEW PHONE # [phone], INFO FOUND IN OUR [facility]

## 2018-02-15 ENCOUNTER — Other Ambulatory Visit: Payer: Self-pay

## 2018-02-17 ENCOUNTER — Telehealth: Payer: Self-pay

## 2018-02-17 ENCOUNTER — Ambulatory Visit (INDEPENDENT_AMBULATORY_CARE_PROVIDER_SITE_OTHER): Payer: Medicare Other

## 2018-02-17 VITALS — BP 142/70 | HR 104 | Temp 99.2°F | Ht 63.0 in | Wt 249.8 lb

## 2018-02-17 DIAGNOSIS — Z1239 Encounter for other screening for malignant neoplasm of breast: Secondary | ICD-10-CM | POA: Diagnosis not present

## 2018-02-17 DIAGNOSIS — Z0000 Encounter for general adult medical examination without abnormal findings: Secondary | ICD-10-CM

## 2018-02-17 DIAGNOSIS — E1165 Type 2 diabetes mellitus with hyperglycemia: Secondary | ICD-10-CM | POA: Diagnosis not present

## 2018-02-17 DIAGNOSIS — M109 Gout, unspecified: Secondary | ICD-10-CM

## 2018-02-17 DIAGNOSIS — I272 Pulmonary hypertension, unspecified: Secondary | ICD-10-CM

## 2018-02-17 LAB — POCT UA - MICROALBUMIN
ALBUMIN/CREATININE RATIO, URINE, POC: 300
Creatinine, POC: 200 mg/dL
Microalbumin Ur, POC: 150 mg/L

## 2018-02-17 LAB — POCT URINALYSIS DIPSTICK
BILIRUBIN UA: NEGATIVE
GLUCOSE UA: NEGATIVE
Ketones, UA: NEGATIVE
Leukocytes, UA: NEGATIVE
Nitrite, UA: NEGATIVE
PH UA: 7 (ref 5.0–8.0)
Protein, UA: POSITIVE — AB
RBC UA: NEGATIVE
Spec Grav, UA: 1.015 (ref 1.010–1.025)
UROBILINOGEN UA: 0.2 U/dL

## 2018-02-17 NOTE — Patient Instructions (Signed)
Anna Clay , Thank you for taking time to come for your Medicare Wellness Visit. I appreciate your ongoing commitment to your health goals. Please review the following plan we discussed and let me know if I can assist you in the future.   Screening recommendations/referrals: Colonoscopy: 08/2015 Mammogram: 05/2016 Bone Density: 05/2014 Recommended yearly ophthalmology/optometry visit for glaucoma screening and checkup Recommended yearly dental visit for hygiene and checkup  Vaccinations: Influenza vaccine: 01/2018 Pneumococcal vaccine: 01/2018 Tdap vaccine: 09/2017 Shingles vaccine: discussed    Advanced directives: Advance directive discussed with you today. I have provided a copy for you to complete at home and have notarized. Once this is complete please bring a copy in to our office so we can scan it into your chart.   Conditions/risks identified: Obesity  Next appointment: 05/19/2018 at 2:45   Preventive Care 65 Years and Older, Female Preventive care refers to lifestyle choices and visits with your health care provider that can promote health and wellness. What does preventive care include?  A yearly physical exam. This is also called an annual well check.  Dental exams once or twice a year.  Routine eye exams. Ask your health care provider how often you should have your eyes checked.  Personal lifestyle choices, including:  Daily care of your teeth and gums.  Regular physical activity.  Eating a healthy diet.  Avoiding tobacco and drug use.  Limiting alcohol use.  Practicing safe sex.  Taking low-dose aspirin every day.  Taking vitamin and mineral supplements as recommended by your health care provider. What happens during an annual well check? The services and screenings done by your health care provider during your annual well check will depend on your age, overall health, lifestyle risk factors, and family history of disease. Counseling  Your health care  provider may ask you questions about your:  Alcohol use.  Tobacco use.  Drug use.  Emotional well-being.  Home and relationship well-being.  Sexual activity.  Eating habits.  History of falls.  Memory and ability to understand (cognition).  Work and work Statistician.  Reproductive health. Screening  You may have the following tests or measurements:  Height, weight, and BMI.  Blood pressure.  Lipid and cholesterol levels. These may be checked every 5 years, or more frequently if you are over 59 years old.  Skin check.  Lung cancer screening. You may have this screening every year starting at age 40 if you have a 30-pack-year history of smoking and currently smoke or have quit within the past 15 years.  Fecal occult blood test (FOBT) of the stool. You may have this test every year starting at age 28.  Flexible sigmoidoscopy or colonoscopy. You may have a sigmoidoscopy every 5 years or a colonoscopy every 10 years starting at age 16.  Hepatitis C blood test.  Hepatitis B blood test.  Sexually transmitted disease (STD) testing.  Diabetes screening. This is done by checking your blood sugar (glucose) after you have not eaten for a while (fasting). You may have this done every 1-3 years.  Bone density scan. This is done to screen for osteoporosis. You may have this done starting at age 49.  Mammogram. This may be done every 1-2 years. Talk to your health care provider about how often you should have regular mammograms. Talk with your health care provider about your test results, treatment options, and if necessary, the need for more tests. Vaccines  Your health care provider may recommend certain vaccines, such as:  Influenza vaccine. This is recommended every year.  Tetanus, diphtheria, and acellular pertussis (Tdap, Td) vaccine. You may need a Td booster every 10 years.  Zoster vaccine. You may need this after age 44.  Pneumococcal 13-valent conjugate (PCV13)  vaccine. One dose is recommended after age 64.  Pneumococcal polysaccharide (PPSV23) vaccine. One dose is recommended after age 59. Talk to your health care provider about which screenings and vaccines you need and how often you need them. This information is not intended to replace advice given to you by your health care provider. Make sure you discuss any questions you have with your health care provider. Document Released: 02/02/2015 Document Revised: 09/26/2015 Document Reviewed: 11/07/2014 Elsevier Interactive Patient Education  2017 [place] Prevention in the Home Falls can cause injuries. They can happen to people of all ages. There are many things you can do to make your home safe and to help prevent falls. What can I do on the outside of my home?  Regularly fix the edges of walkways and driveways and fix any cracks.  Remove anything that might make you trip as you walk through a door, such as a raised step or threshold.  Trim any bushes or trees on the path to your home.  Use bright outdoor lighting.  Clear any walking paths of anything that might make someone trip, such as rocks or tools.  Regularly check to see if handrails are loose or broken. Make sure that both sides of any steps have handrails.  Any raised decks and porches should have guardrails on the edges.  Have any leaves, snow, or ice cleared regularly.  Use sand or salt on walking paths during winter.  Clean up any spills in your garage right away. This includes oil or grease spills. What can I do in the bathroom?  Use night lights.  Install grab bars by the toilet and in the tub and shower. Do not use towel bars as grab bars.  Use non-skid mats or decals in the tub or shower.  If you need to sit down in the shower, use a plastic, non-slip stool.  Keep the floor dry. Clean up any water that spills on the floor as soon as it happens.  Remove soap buildup in the tub or shower  regularly.  Attach bath mats securely with double-sided non-slip rug tape.  Do not have throw rugs and other things on the floor that can make you trip. What can I do in the bedroom?  Use night lights.  Make sure that you have a light by your bed that is easy to reach.  Do not use any sheets or blankets that are too big for your bed. They should not hang down onto the floor.  Have a firm chair that has side arms. You can use this for support while you get dressed.  Do not have throw rugs and other things on the floor that can make you trip. What can I do in the kitchen?  Clean up any spills right away.  Avoid walking on wet floors.  Keep items that you use a lot in easy-to-reach places.  If you need to reach something above you, use a strong step stool that has a grab bar.  Keep electrical cords out of the way.  Do not use floor polish or wax that makes floors slippery. If you must use wax, use non-skid floor wax.  Do not have throw rugs and other things on the floor that  can make you trip. What can I do with my stairs?  Do not leave any items on the stairs.  Make sure that there are handrails on both sides of the stairs and use them. Fix handrails that are broken or loose. Make sure that handrails are as long as the stairways.  Check any carpeting to make sure that it is firmly attached to the stairs. Fix any carpet that is loose or worn.  Avoid having throw rugs at the top or bottom of the stairs. If you do have throw rugs, attach them to the floor with carpet tape.  Make sure that you have a light switch at the top of the stairs and the bottom of the stairs. If you do not have them, ask someone to add them for you. What else can I do to help prevent falls?  Wear shoes that:  Do not have high heels.  Have rubber bottoms.  Are comfortable and fit you well.  Are closed at the toe. Do not wear sandals.  If you use a stepladder:  Make sure that it is fully  opened. Do not climb a closed stepladder.  Make sure that both sides of the stepladder are locked into place.  Ask someone to hold it for you, if possible.  Clearly mark and make sure that you can see:  Any grab bars or handrails.  First and last steps.  Where the edge of each step is.  Use tools that help you move around (mobility aids) if they are needed. These include:  Canes.  Walkers.  Scooters.  Crutches.  Turn on the lights when you go into a dark area. Replace any light bulbs as soon as they burn out.  Set up your furniture so you have a clear path. Avoid moving your furniture around.  If any of your floors are uneven, fix them.  If there are any pets around you, be aware of where they are.  Review your medicines with your doctor. Some medicines can make you feel dizzy. This can increase your chance of falling. Ask your doctor what other things that you can do to help prevent falls. This information is not intended to replace advice given to you by your health care provider. Make sure you discuss any questions you have with your health care provider. Document Released: 11/02/2008 Document Revised: 06/14/2015 Document Reviewed: 02/10/2014 Elsevier Interactive Patient Education  2017 [employer].

## 2018-02-17 NOTE — Telephone Encounter (Signed)
Pt.notified

## 2018-02-17 NOTE — Telephone Encounter (Signed)
-----   Message from Glendale Chard, MD sent at 02/15/2018  2:13 PM EST ----- I need to get kidney fxn first. Doesn't she have appt soon? ----- Message ----- From: Michelle Nasuti, CMA Sent: 02/15/2018   2:00 PM EST To: Glendale Chard, MD  The pt said that she was on allopurinal in the past for a gout study and wants to know if she can get a refill of the medication to help  with her gout.

## 2018-02-17 NOTE — Progress Notes (Signed)
Subjective:   Anna Clay is a 68 y.o. female who presents for Medicare Annual (Subsequent) preventive examination.  Review of Systems:  n/a Cardiac Risk Factors include: advanced age (>59men, >4 women);diabetes mellitus;dyslipidemia;obesity (BMI >30kg/m2);hypertension     Objective:     Vitals: BP (!) 142/70 (BP Location: Left Arm, Patient Position: Sitting)   Pulse (!) 104   Temp 99.2 F (37.3 C) (Oral)   Ht 5\' 3"  (1.6 m)   Wt 249 lb 12.8 oz (113.3 kg)   BMI 44.25 kg/m   Body mass index is 44.25 kg/m.  Advanced Directives 02/17/2018 12/05/2016 11/10/2016 10/24/2016 12/28/2013 10/04/2013 10/04/2013  Does Patient Have a Medical Advance Directive? No No No No No No No  Would patient like information on creating a medical advance directive? Yes (MAU/Ambulatory/Procedural Areas - Information given) No - Patient declined No - Patient declined Yes (MAU/Ambulatory/Procedural Areas - Information given) Yes - Educational materials given No - patient declined information -    Tobacco Social History   Tobacco Use  Smoking Status Former Smoker  . Packs/day: 1.00  . Years: 43.00  . Pack years: 43.00  . Types: Cigarettes  . Last attempt to quit: 09/20/2013  . Years since quitting: 4.4  Smokeless Tobacco Never Used     Counseling given: Not Answered   Clinical Intake:  Pre-visit preparation completed: Yes  Pain : No/denies pain Pain Score: 0-No pain     Nutritional Status: BMI > 30  Obese Nutritional Risks: None Diabetes: Yes CBG done?: No Did pt. bring in CBG monitor from home?: No  How often do you need to have someone help you when you read instructions, pamphlets, or other written materials from your doctor or pharmacy?: 1 - Never What is the last grade level you completed in school?: some college  Interpreter Needed?: No  Information entered by :: NAllen LPN  Past Medical History:  Diagnosis Date  . Chest pain   . Diabetes mellitus with kidney disease ([place])     . Diastolic heart failure ([place])   . Gout   . Hypercholesteremia   . Hypertension   . Obesity   . OSA (obstructive sleep apnea)   . Oxygen deficiency    Past Surgical History:  Procedure Laterality Date  . ABDOMINAL HYSTERECTOMY    . UVULOPALATOPHARYNGOPLASTY (UPPP)/TONSILLECTOMY/SEPTOPLASTY  01/25/2008   By Minna Merritts   Family History  Problem Relation Age of Onset  . Heart disease Father   . Heart disease Sister   . Cancer - Lung Sister   . Sickle cell trait Other        all family members  . Cancer Brother        bone  . Diabetes Brother   . Diabetes Sister    Social History   Socioeconomic History  . Marital status: Widowed    Spouse name: Not on file  . Number of children: 1  . Years of education: Not on file  . Highest education level: Not on file  Occupational History  . Occupation: retired  Scientific laboratory technician  . Financial resource strain: Not on file  . Food insecurity:    Worry: Never true    Inability: Never true  . Transportation needs:    Medical: No    Non-medical: No  Tobacco Use  . Smoking status: Former Smoker    Packs/day: 1.00    Years: 43.00    Pack years: 43.00    Types: Cigarettes    Last attempt to  quit: 09/20/2013    Years since quitting: 4.4  . Smokeless tobacco: Never Used  Substance and Sexual Activity  . Alcohol use: Not Currently    Alcohol/week: 0.0 standard drinks  . Drug use: No  . Sexual activity: Not Currently  Lifestyle  . Physical activity:    Days per week: 3 days    Minutes per session: 30 min  . Stress: Not at all  Relationships  . Social connections:    Talks on phone: Not on file    Gets together: Not on file    Attends religious service: Not on file    Active member of club or organization: Not on file    Attends meetings of clubs or organizations: Not on file    Relationship status: Not on file  Other Topics Concern  . Not on file  Social History Narrative  . Not on file    Outpatient Encounter  Medications as of 02/17/2018  Medication Sig  . acetaminophen (TYLENOL) 650 MG CR tablet Take 1,300 mg by mouth every 8 (eight) hours as needed for pain.  Marland Kitchen amLODipine (NORVASC) 5 MG tablet TAKE ONE TABLET BY MOUTH DAILY  . aspirin EC 81 MG tablet Take 81 mg by mouth daily.  . Cholecalciferol (VITAMIN D) 2000 UNITS CAPS Take 1 capsule by mouth daily.  . ferrous sulfate 325 (65 FE) MG EC tablet Take 325 mg by mouth daily.  . Flaxseed, Linseed, (FLAXSEED OIL MAX STR) 1300 MG CAPS Take 1 capsule by mouth daily.  . folic acid (FOLVITE) 1 MG tablet TAKE ONE TABLET BY MOUTH DAILY  . glucose blood (CONTOUR TEST) test strip Use as instructed to check blood sugars 2 times per day dx: e11.65  . insulin aspart (NOVOLOG) 100 UNIT/ML FlexPen Sliding scale w/ each meal  . Insulin Glargine, 1 Unit Dial, (TOUJEO SOLOSTAR) 300 UNIT/ML SOPN Inject 62 Units into the skin at bedtime.  . metoprolol succinate (TOPROL-XL) 12.5 mg TB24 24 hr tablet Take 0.5 tablets (12.5 mg total) by mouth daily.  . Multiple Vitamin (MULTIVITAMIN WITH MINERALS) TABS tablet Take 1 tablet by mouth daily.  . nitroGLYCERIN (NITROSTAT) 0.4 MG SL tablet Place 0.4 mg under the tongue every 5 (five) minutes as needed for chest pain.  Marland Kitchen NOVOFINE 32G X 6 MM MISC Inject 1 applicator every morning into the skin.  . Pitavastatin Calcium 4 MG TABS Take 4 mg by mouth daily.  . ranitidine (ZANTAC) 150 MG tablet Take 150 mg by mouth daily as needed for heartburn.  . simvastatin (ZOCOR) 20 MG tablet Take 20 mg by mouth daily.  Marland Kitchen spironolactone (ALDACTONE) 25 MG tablet TAKE ONE TABLET BY MOUTH DAILY  . torsemide (DEMADEX) 20 MG tablet Take 2 tablets (40 mg total) by mouth daily.  . mometasone (NASONEX) 50 MCG/ACT nasal spray Place 2 sprays into the nose daily.  . STUDY MEDICATION Take 2 capsules by mouth daily. Take 1 capsule from each bottle ([id number] and [id number]) every morning. [employer], Protocol No. [id number]. Study of Febuxostat,  Allopurinol, or Placebo.   No facility-administered encounter medications on file as of 02/17/2018.     Activities of Daily Living In your present state of health, do you have any difficulty performing the following activities: 02/17/2018  Hearing? N  Vision? N  Difficulty concentrating or making decisions? N  Walking or climbing stairs? Y  Comment bad hip  Dressing or bathing? N  Doing errands, shopping? N  Preparing Food and eating ? N  Using  the Toilet? N  In the past six months, have you accidently leaked urine? N  Do you have problems with loss of bowel control? N  Managing your Medications? N  Managing your Finances? N  Housekeeping or managing your Housekeeping? N  Some recent data might be hidden    Patient Care Team: Glendale Chard, MD as PCP - General (Internal Medicine)    Assessment:   This is a routine wellness examination for Shahidah.  Exercise Activities and Dietary recommendations Current Exercise Habits: Home exercise routine, Type of exercise: walking, Time (Minutes): 30, Frequency (Times/Week): 3, Weekly Exercise (Minutes/Week): 90, Intensity: Moderate, Exercise limited by: None identified  Goals    . Patient Stated (pt-stated)     No goals       Fall Risk Fall Risk  02/17/2018 08/20/2017  Falls in the past year? 0 No  Comment - Emmi Telephone Survey: data to providers prior to load  Risk for fall due to : Medication side effect -  Follow up Education provided;Falls prevention discussed -   Is the patient's home free of loose throw rugs in walkways, pet beds, electrical cords, etc?   yes      Grab bars in the bathroom? no      Handrails on the stairs?   yes      Adequate lighting?   yes  Timed Get Up and Go performed: n/a  Depression Screen PHQ 2/9 Scores 02/17/2018  PHQ - 2 Score 0  PHQ- 9 Score 5     Cognitive Function     6CIT Screen 02/17/2018  What Year? 0 points  What month? 0 points  What time? 0 points  Count back from 20 0 points    Months in reverse 0 points  Repeat phrase 0 points  Total Score 0    Immunization History  Administered Date(s) Administered  . Influenza, High Dose Seasonal PF 01/27/2018  . Influenza,inj,Quad PF,6+ Mos 10/06/2013  . Pneumococcal Conjugate-13 01/27/2018  . Pneumococcal Polysaccharide-23 10/06/2013    Qualifies for Shingles Vaccine? yes  Screening Tests Health Maintenance  Topic Date Due  . FOOT EXAM  02/22/1961  . OPHTHALMOLOGY EXAM  02/22/1961  . URINE MICROALBUMIN  02/22/1961  . TETANUS/TDAP  02/22/1970  . HEMOGLOBIN A1C  04/05/2014  . MAMMOGRAM  09/20/2015  . DEXA SCAN  02/23/2016  . PNA vac Low Risk Adult (2 of 2 - PPSV23) 01/28/2019  . COLONOSCOPY  08/29/2025  . INFLUENZA VACCINE  Completed  . Hepatitis C Screening  Completed    Cancer Screenings: Lung: Low Dose CT Chest recommended if Age 20-80 years, 30 pack-year currently smoking OR have quit w/in 15years. Patient does not qualify. Breast:  Up to date on Mammogram? No   Up to date of Bone Density/Dexa? Yes Colorectal: up to date  Additional Screenings: : Hepatitis C Screening: 03/28/2015 0.1     Plan:   Due for colonoscopy in August.  I have personally reviewed and noted the following in the patient's chart:   . Medical and social history . Use of alcohol, tobacco or illicit drugs  . Current medications and supplements . Functional ability and status . Nutritional status . Physical activity . Advanced directives . List of other physicians . Hospitalizations, surgeries, and ER visits in previous 12 months . Vitals . Screenings to include cognitive, depression, and falls . Referrals and appointments  In addition, I have reviewed and discussed with patient certain preventive protocols, quality metrics, and best practice recommendations. A written  personalized care plan for preventive services as well as general preventive health recommendations were provided to patient.     Kellie Simmering,  LPN  [id number]

## 2018-02-17 NOTE — Telephone Encounter (Signed)
error 

## 2018-02-18 ENCOUNTER — Other Ambulatory Visit: Payer: Self-pay

## 2018-02-18 LAB — CMP14+EGFR
A/G RATIO: 1.2 (ref 1.2–2.2)
ALT: 15 IU/L (ref 0–32)
AST: 15 IU/L (ref 0–40)
Albumin: 4.2 g/dL (ref 3.8–4.8)
Alkaline Phosphatase: 92 IU/L (ref 39–117)
BUN/Creatinine Ratio: 17 (ref 12–28)
BUN: 21 mg/dL (ref 8–27)
Bilirubin Total: 0.2 mg/dL (ref 0.0–1.2)
CALCIUM: 10.3 mg/dL (ref 8.7–10.3)
CO2: 25 mmol/L (ref 20–29)
CREATININE: 1.21 mg/dL — AB (ref 0.57–1.00)
Chloride: 95 mmol/L — ABNORMAL LOW (ref 96–106)
GFR, EST AFRICAN AMERICAN: 54 mL/min/{1.73_m2} — AB (ref >59)
GFR, EST NON AFRICAN AMERICAN: 47 mL/min/{1.73_m2} — AB (ref >59)
Globulin, Total: 3.4 g/dL (ref 1.5–4.5)
Glucose: 98 mg/dL (ref 65–99)
Potassium: 4.3 mmol/L (ref 3.5–5.2)
Sodium: 137 mmol/L (ref 134–144)
TOTAL PROTEIN: 7.6 g/dL (ref 6.0–8.5)

## 2018-02-18 LAB — URIC ACID: URIC ACID: 10.8 mg/dL — AB (ref 2.5–7.1)

## 2018-02-18 LAB — HEMOGLOBIN A1C
ESTIMATED AVERAGE GLUCOSE: 157 mg/dL
Hgb A1c MFr Bld: 7.1 % — ABNORMAL HIGH (ref 4.8–5.6)

## 2018-02-18 MED ORDER — ALLOPURINOL 100 MG PO TABS: 100.0000 mg | ORAL_TABLET | Freq: Every day | ORAL | 1 refills | Status: DC

## 2018-02-19 ENCOUNTER — Other Ambulatory Visit: Payer: Self-pay | Admitting: Internal Medicine

## 2018-03-03 ENCOUNTER — Other Ambulatory Visit: Payer: Self-pay

## 2018-03-03 MED ORDER — ALLOPURINOL 100 MG PO TABS: 100.0000 mg | ORAL_TABLET | Freq: Every day | ORAL | 2 refills | Status: DC

## 2018-03-03 MED ORDER — INSULIN GLARGINE (1 UNIT DIAL) 300 UNIT/ML ~~LOC~~ SOPN: 62.0000 [IU] | PEN_INJECTOR | Freq: Every day | SUBCUTANEOUS | 2 refills | Status: DC

## 2018-03-07 ENCOUNTER — Other Ambulatory Visit: Payer: Self-pay | Admitting: Internal Medicine

## 2018-03-20 ENCOUNTER — Other Ambulatory Visit: Payer: Self-pay | Admitting: Cardiology

## 2018-03-24 ENCOUNTER — Ambulatory Visit: Payer: Medicare Other | Admitting: Internal Medicine

## 2018-03-31 ENCOUNTER — Other Ambulatory Visit: Payer: Self-pay | Admitting: Cardiology

## 2018-03-31 ENCOUNTER — Other Ambulatory Visit: Payer: Self-pay | Admitting: Internal Medicine

## 2018-04-28 ENCOUNTER — Telehealth: Payer: Self-pay

## 2018-04-28 NOTE — Telephone Encounter (Signed)
The pt was notified that Dr. Baird Cancer said that the pt can use the cbd oil to see if it will help with her pain.

## 2018-04-30 ENCOUNTER — Other Ambulatory Visit: Payer: Self-pay | Admitting: Cardiology

## 2018-05-19 ENCOUNTER — Encounter: Payer: Self-pay | Admitting: Internal Medicine

## 2018-05-19 ENCOUNTER — Ambulatory Visit (INDEPENDENT_AMBULATORY_CARE_PROVIDER_SITE_OTHER): Payer: Medicare Other | Admitting: Internal Medicine

## 2018-05-19 VITALS — BP 118/66 | HR 95 | Temp 98.8°F | Ht 63.0 in | Wt 243.4 lb

## 2018-05-19 DIAGNOSIS — M109 Gout, unspecified: Secondary | ICD-10-CM | POA: Diagnosis not present

## 2018-05-19 DIAGNOSIS — I272 Pulmonary hypertension, unspecified: Secondary | ICD-10-CM | POA: Diagnosis not present

## 2018-05-19 DIAGNOSIS — Z1231 Encounter for screening mammogram for malignant neoplasm of breast: Secondary | ICD-10-CM

## 2018-05-19 DIAGNOSIS — L84 Corns and callosities: Secondary | ICD-10-CM

## 2018-05-19 DIAGNOSIS — E2839 Other primary ovarian failure: Secondary | ICD-10-CM

## 2018-05-19 DIAGNOSIS — E1165 Type 2 diabetes mellitus with hyperglycemia: Secondary | ICD-10-CM

## 2018-05-19 DIAGNOSIS — G4733 Obstructive sleep apnea (adult) (pediatric): Secondary | ICD-10-CM | POA: Diagnosis not present

## 2018-05-19 DIAGNOSIS — Z6841 Body Mass Index (BMI) 40.0 and over, adult: Secondary | ICD-10-CM | POA: Diagnosis not present

## 2018-05-19 NOTE — Patient Instructions (Signed)

## 2018-05-20 ENCOUNTER — Ambulatory Visit: Payer: Self-pay

## 2018-05-20 DIAGNOSIS — I272 Pulmonary hypertension, unspecified: Secondary | ICD-10-CM

## 2018-05-20 DIAGNOSIS — M109 Gout, unspecified: Secondary | ICD-10-CM

## 2018-05-20 DIAGNOSIS — E1165 Type 2 diabetes mellitus with hyperglycemia: Secondary | ICD-10-CM

## 2018-05-20 LAB — CMP14+EGFR
ALT: 13 IU/L (ref 0–32)
AST: 13 IU/L (ref 0–40)
Albumin/Globulin Ratio: 1.2 (ref 1.2–2.2)
Albumin: 4 g/dL (ref 3.8–4.8)
Alkaline Phosphatase: 99 IU/L (ref 39–117)
BUN/Creatinine Ratio: 28 (ref 12–28)
BUN: 31 mg/dL — ABNORMAL HIGH (ref 8–27)
Bilirubin Total: 0.4 mg/dL (ref 0.0–1.2)
CO2: 23 mmol/L (ref 20–29)
Calcium: 9.8 mg/dL (ref 8.7–10.3)
Chloride: 97 mmol/L (ref 96–106)
Creatinine, Ser: 1.11 mg/dL — ABNORMAL HIGH (ref 0.57–1.00)
GFR calc Af Amer: 59 mL/min/{1.73_m2} — ABNORMAL LOW (ref >59)
GFR calc non Af Amer: 52 mL/min/{1.73_m2} — ABNORMAL LOW (ref >59)
Globulin, Total: 3.3 g/dL (ref 1.5–4.5)
Glucose: 111 mg/dL — ABNORMAL HIGH (ref 65–99)
Potassium: 4.4 mmol/L (ref 3.5–5.2)
Sodium: 139 mmol/L (ref 134–144)
Total Protein: 7.3 g/dL (ref 6.0–8.5)

## 2018-05-20 LAB — URIC ACID: Uric Acid: 9 mg/dL — ABNORMAL HIGH (ref 2.5–7.1)

## 2018-05-20 LAB — LIPID PANEL
Chol/HDL Ratio: 4.4 ratio (ref 0.0–4.4)
Cholesterol, Total: 163 mg/dL (ref 100–199)
HDL: 37 mg/dL — ABNORMAL LOW (ref >39)
LDL Calculated: 100 mg/dL — ABNORMAL HIGH (ref 0–99)
Triglycerides: 130 mg/dL (ref 0–149)
VLDL Cholesterol Cal: 26 mg/dL (ref 5–40)

## 2018-05-20 LAB — HEMOGLOBIN A1C
Est. average glucose Bld gHb Est-mCnc: 163 mg/dL
Hgb A1c MFr Bld: 7.3 % — ABNORMAL HIGH (ref 4.8–5.6)

## 2018-05-20 NOTE — Progress Notes (Signed)
Subjective:     Patient ID: Anna Clay , female    DOB: 07/03/1950 , 68 y.o.   MRN: [id number]   Chief Complaint  Patient presents with  . Diabetes  . Hypertension    HPI  She is here today to re-establish care. She moved to [place] last year to live with her son and DIL. She established care with MDs in [place]. She has decided to return to the practice because she was unsatisfied with their care.   Diabetes  She presents for her follow-up diabetic visit. She has type 2 diabetes mellitus. There are no hypoglycemic associated symptoms. Pertinent negatives for diabetes include no blurred vision and no chest pain. There are no hypoglycemic complications. Diabetic complications include nephropathy. Risk factors for coronary artery disease include diabetes mellitus, dyslipidemia, hypertension, obesity, post-menopausal and sedentary lifestyle. She participates in exercise intermittently. An ACE inhibitor/angiotensin II receptor blocker is being taken.  Hypertension  This is a chronic problem. The current episode started more than 1 year ago. The problem has been gradually improving since onset. The problem is controlled. Pertinent negatives include no blurred vision, chest pain, palpitations or shortness of breath. Hypertensive end-organ damage includes heart failure. Identifiable causes of hypertension include chronic renal disease and sleep apnea.   She reports compliance with meds.   Past Medical History:  Diagnosis Date  . Chest pain   . Diabetes mellitus with kidney disease (HCC)   . Diastolic heart failure (HCC)   . Gout   . Hypercholesteremia   . Hypertension   . Obesity   . OSA (obstructive sleep apnea)   . Oxygen deficiency      Family History  Problem Relation Age of Onset  . Heart disease Father   . Heart disease Sister   . Cancer - Lung Sister   . Sickle cell trait Other        all family members  . Cancer Brother        bone  . Diabetes Brother   . Diabetes  Sister      Current Outpatient Medications:  .  acetaminophen (TYLENOL) 650 MG CR tablet, Take 1,300 mg by mouth every 8 (eight) hours as needed for pain., Disp: , Rfl:  .  allopurinol (ZYLOPRIM) 100 MG tablet, Take 1 tablet (100 mg total) by mouth daily., Disp: 90 tablet, Rfl: 2 .  amLODipine (NORVASC) 5 MG tablet, TAKE ONE TABLET BY MOUTH DAILY, Disp: 90 tablet, Rfl: 0 .  aspirin EC 81 MG tablet, Take 81 mg by mouth daily., Disp: , Rfl:  .  Cholecalciferol (VITAMIN D) 2000 UNITS CAPS, Take 1 capsule by mouth daily., Disp: , Rfl:  .  ferrous sulfate 325 (65 FE) MG EC tablet, Take 325 mg by mouth daily., Disp: , Rfl:  .  Flaxseed, Linseed, (FLAXSEED OIL MAX STR) 1300 MG CAPS, Take 1 capsule by mouth daily., Disp: , Rfl:  .  folic acid (FOLVITE) 1 MG tablet, TAKE ONE TABLET BY MOUTH DAILY, Disp: 90 tablet, Rfl: 10 .  glucose blood (CONTOUR TEST) test strip, Use as instructed to check blood sugars 2 times per day dx: e11.65, Disp: 100 each, Rfl: 11 .  Insulin Glargine, 1 Unit Dial, (TOUJEO SOLOSTAR) 300 UNIT/ML SOPN, Inject 62 Units into the skin at bedtime., Disp: 18 pen, Rfl: 2 .  metoprolol succinate (TOPROL-XL) 25 MG 24 hr tablet, TAKE ONE TABLET BY MOUTH DAILY, Disp: 90 tablet, Rfl: 0 .  Multiple Vitamin (MULTIVITAMIN WITH MINERALS) TABS   tablet, Take 1 tablet by mouth daily., Disp: , Rfl:  .  nitroGLYCERIN (NITROSTAT) 0.4 MG SL tablet, Place 0.4 mg under the tongue every 5 (five) minutes as needed for chest pain., Disp: , Rfl:  .  NOVOFINE 32G X 6 MM MISC, Inject 1 applicator every morning into the skin., Disp: , Rfl:  .  Pitavastatin Calcium 4 MG TABS, Take 4 mg by mouth daily., Disp: , Rfl:  .  Potassium Gluconate 550 MG TABS, Take 1 tablet by mouth daily., Disp: , Rfl:  .  ranitidine (ZANTAC) 150 MG tablet, Take 150 mg by mouth daily as needed for heartburn., Disp: , Rfl:  .  simvastatin (ZOCOR) 20 MG tablet, Take 20 mg by mouth daily., Disp: , Rfl:  .  spironolactone (ALDACTONE) 25 MG  tablet, TAKE ONE TABLET BY MOUTH DAILY, Disp: 90 tablet, Rfl: 0 .  torsemide (DEMADEX) 20 MG tablet, TAKE ONE TABLET BY MOUTH DAILY, Disp: 90 tablet, Rfl: 0 .  HUMALOG KWIKPEN 200 UNIT/ML SOPN, INJECT 6 UNITS BY SUBCUTANEOUS ROUTE  AT BREAKFAST, 8 UNITS AT LUNCH AND 10 UNITS AT DINNER (Patient not taking: Reported on 05/19/2018), Disp: 6 pen, Rfl: 4   Allergies  Allergen Reactions  . Allspice [Pimenta] Hives and Shortness Of Breath  . Black Cohosh Hives and Shortness Of Breath  . Peach [Prunus Persica] Hives and Shortness Of Breath    Fresh peaches  . Ivp Dye [Iodinated Diagnostic Agents] Other (See Comments)    Reaction unknown  . Tetracyclines & Related Other (See Comments)    Reaction unknown     Review of Systems  Constitutional: Negative.   Eyes: Negative for blurred vision.  Respiratory: Negative.  Negative for shortness of breath.   Cardiovascular: Negative.  Negative for chest pain and palpitations.  Gastrointestinal: Negative.   Neurological: Negative.   Psychiatric/Behavioral: Negative.      Today's Vitals   05/19/18 0918  BP: 118/66  Pulse: 95  Temp: 98.8 F (37.1 C)  TempSrc: Oral  Weight: 243 lb 6.4 oz (110.4 kg)  Height: 5' 3" (1.6 m)  PainSc: 3   PainLoc: Back   Body mass index is 43.12 kg/m.   Objective:  Physical Exam Vitals signs and nursing note reviewed.  Constitutional:      Appearance: Normal appearance.  HENT:     Head: Normocephalic and atraumatic.  Cardiovascular:     Rate and Rhythm: Normal rate and regular rhythm.     Pulses:          Dorsalis pedis pulses are 2+ on the right side and 2+ on the left side.     Heart sounds: Normal heart sounds.  Pulmonary:     Effort: Pulmonary effort is normal.     Breath sounds: Normal breath sounds.  Feet:     Right foot:     Protective Sensation: 5 sites tested. 5 sites sensed.     Skin integrity: Callus present.     Toenail Condition: Right toenails are normal.     Left foot:     Protective  Sensation: 5 sites tested. 5 sites sensed.     Skin integrity: Callus present.     Toenail Condition: Left toenails are normal.  Skin:    General: Skin is warm.  Neurological:     General: No focal deficit present.     Mental Status: She is alert.  Psychiatric:        Mood and Affect: Mood normal.  Behavior: Behavior normal.         Assessment And Plan:     1. Uncontrolled type 2 diabetes mellitus with hyperglycemia (HCC)  I will check labs as listed below. I will make further recommendations once her labs are available for review. She is also agreeable to referral to CCM team. We are both curious to see if she is eligible for transportation to/from MD appts, especially since [place].   - CMP14+EGFR - Hemoglobin A1c - Lipid panel - Referral to Chronic Care Management Services  2. Pulmonary hypertension (HCC)  Chronic, yet stable. Also followed by Cardiology.   3. Gout, unspecified cause, unspecified chronicity, unspecified site  I will check uric acid level and determine what treatment is needed. She is encouraged to stay well hydrated and to avoid foods that she knows trigger her sx.   - Uric acid  4. OSA (obstructive sleep apnea)  Chronic. Importance of CPAP compliance was discussed with the patient. She admits that she does wear nightly. She also offers that she feels better while on the CPAP.   5. Estrogen deficiency  I will refer for bone density exam. She prefers referral to location in [place].   6. Breast cancer screening  I will refer her for mammogram. I will request that this is scheduled same day as her bone density. She is encouraged to perform monthly SBE.   7. Callus  She requests referral to podiatry. However, she wants to wait to find out the name of podiatrist her DIL goes to in [place].   8. Class 3 severe obesity due to excess calories without serious comorbidity with body mass index (BMI) of 40.0 to 44.9 in adult  (HCC)  Importance of achieving optimal weight to decrease risk of cardiovascular disease and cancers was discussed with the patient in full detail. She is encouraged to start slowly - start with 10 minutes twice daily at least three to four days per week and to gradually build to 30 minutes five days weekly. She was given tips to incorporate more activity into her daily routine - take stairs when possible, park farther away from her job, grocery stores, etc.      N , MD    THE PATIENT IS ENCOURAGED TO PRACTICE SOCIAL DISTANCING DUE TO THE COVID-19 PANDEMIC.   

## 2018-05-20 NOTE — Chronic Care Management (AMB) (Signed)
  Chronic Care Management   Outreach Note  05/20/2018 Name: Anna Clay MRN: [id number] DOB: May 18, 1950  Referred by: Glendale Chard, MD Reason for referral : Uncontrolled DM  An unsuccessful telephone outreach was attempted today. The patient was referred to the case management team by for assistance with chronic care management and care coordination.   Follow Up Plan: A HIPPA compliant phone message was left for the patient providing contact information and requesting a return call.  The CM team will reach out to the patient again over the next 7 days.   Daneen Schick, BSW, CDP [facility] / [facility] Care Management Social Worker [phone]  Total time spent performing care coordination and/or care management activities with the patient by phone or face to face = 5 minutes.

## 2018-05-21 ENCOUNTER — Telehealth: Payer: Self-pay

## 2018-05-21 NOTE — Telephone Encounter (Signed)
Left the patient a message to call back for lab results. 

## 2018-05-21 NOTE — Telephone Encounter (Signed)
-----   Message from Glendale Chard, MD sent at 05/20/2018  2:37 PM EDT ----- Liver and kidney fxn are stable. hba1c is 7.3, this is great! I am so proud of you! You have done a great job to keep your sugars under control!  Your LDL, bad chol is 100. Ideally, should be less than 70. Additionally, HDL, good chol is low. Need to increase exercise. Can you start walking to the mailbox regularly? Sometimes twice a day? Your gout levels are high. Have you been taking allopurinol daily? Be sure to stay well hydrated.

## 2018-05-21 NOTE — Telephone Encounter (Signed)
Gave pt her lab results. She is willing to start excersicing more. She also states she is taking her medications as directed

## 2018-05-25 ENCOUNTER — Ambulatory Visit: Payer: Self-pay

## 2018-05-25 DIAGNOSIS — E1165 Type 2 diabetes mellitus with hyperglycemia: Secondary | ICD-10-CM

## 2018-05-25 DIAGNOSIS — I272 Pulmonary hypertension, unspecified: Secondary | ICD-10-CM

## 2018-05-25 NOTE — Chronic Care Management (AMB) (Signed)
  Chronic Care Management    Clinical Social Work General Note  05/25/2018 Name: Anna Clay MRN: [id number] DOB: 04/24/1950  Anna Clay is a 68 y.o. year old female who is a primary care patient of Glendale Chard, MD. The CCM was consulted to assist the patient with Transportation Resources.   Anna Clay was given information about Chronic Care Management services today including:  1. CCM service includes personalized support from designated clinical staff supervised by her physician, including individualized plan of care and coordination with other care providers 2. 24/7 contact phone numbers for assistance for urgent and routine care needs. 3. Service will only be billed when office clinical staff spend 20 minutes or more in a month to coordinate care. 4. Only one practitioner may furnish and bill the service in a calendar month. 5. The patient may stop CCM services at any time (effective at the end of the month) by phone call to the office staff. 6. The patient will be responsible for cost sharing (co-pay) of up to 20% of the service fee (after annual deductible is met).  Patient agreed to services and verbal consent obtained.   Review of patient status, including review of consultants reports, relevant laboratory and other test results, and collaboration with appropriate care team members and the patient's provider was performed as part of comprehensive patient evaluation and provision of chronic care management services.    SDOH (Social Determinants of Health) screening performed today. See Care Plan Entry related to challenges with:  Transportation.  Goals Addressed            This Visit's Progress     Patient Stated   . "I don't want to give up my doctor" (pt-stated)       Current Barriers:  . Limited social support  . Limited transportation resources from patients home to physician office . Concern that her son and daughter in law have to take the day off work to provide  transportation for appointments  Clinical Social Work Clinical Goal(s):  Marland Kitchen Over the next 30 days, client will work with SW to address concerns related to transportation barriers  Interventions: . Patient interviewed and appropriate assessments performed . Discussed plans with patient for ongoing care management follow up and provided patient with direct contact information for care management team . Collaborated with RN Case Manager re: patient enrollment into CCM program  . Informed the patient of barriers to transportation resources [place] . Advised the patient this SW would investigate resource options and return patient call at a later date  Patient Self Care Activities:  . Attends all scheduled provider appointments . Calls pharmacy for medication refills . Attends church or other social activities . Calls provider office for new concerns or questions  Initial goal documentation         Follow Up Plan: SW will follow up with patient by phone over the next 1-2 weeks       Anna Clay, [place], CDP [facility] / [facility] Care Management Social Worker [phone]  Total time spent performing care coordination and/or care management activities with the patient by phone or face to face = 20 minutes.

## 2018-05-25 NOTE — Patient Instructions (Signed)
Social Worker Visit Information  Goals we discussed today:  Goals Addressed            This Visit's Progress     Patient Stated   . "I don't want to give up my doctor" (pt-stated)       Current Barriers:  . Limited social support  . Limited transportation resources from patients home to physician office . Concern that her son and daughter in law have to take the day off work to provide transportation for appointments  Clinical Social Work Clinical Goal(s):  Marland Kitchen Over the next 30 days, client will work with SW to address concerns related to transportation barriers  Interventions: . Patient interviewed and appropriate assessments performed . Discussed plans with patient for ongoing care management follow up and provided patient with direct contact information for care management team . Collaborated with RN Case Manager re: patient enrollment into CCM program  . Informed the patient of barriers to transportation resources [place] . Advised the patient this SW would investigate resource options and return patient call at a later date  Patient Self Care Activities:  . Attends all scheduled provider appointments . Calls pharmacy for medication refills . Attends church or other social activities . Calls provider office for new concerns or questions  Initial goal documentation         Materials provided: Verbal education about CCM program provided by phone  Ms. He was given information about Chronic Care Management services today including:  1. CCM service includes personalized support from designated clinical staff supervised by her physician, including individualized plan of care and coordination with other care providers 2. 24/7 contact phone numbers for assistance for urgent and routine care needs. 3. Service will only be billed when office clinical staff spend 20 minutes or more in a month to coordinate care. 4. Only one practitioner may furnish and bill the  service in a calendar month. 5. The patient may stop CCM services at any time (effective at the end of the month) by phone call to the office staff. 6. The patient will be responsible for cost sharing (co-pay) of up to 20% of the service fee (after annual deductible is met).  Patient agreed to services and verbal consent obtained.   The patient verbalized understanding of instructions provided today and declined a print copy of patient instruction materials.   Follow up plan: SW will follow up with patient by phone over the next 1-2 weeks   Daneen Schick, BSW, CDP [facility] / [place] Management Social Worker [phone]

## 2018-05-25 NOTE — Chronic Care Management (AMB) (Signed)
  Chronic Care Management   Initial Visit Note  05/25/2018 Name: Anna Clay MRN: [id number] DOB: 1950-11-09  Referred by: Glendale Chard, MD Reason for referral : [name] is a 68 y.o. year old female who is a primary care patient of Glendale Chard, MD. The CCM team was consulted for assistance with chronic disease management and care coordination needs.   Review of patient status, including review of consultants reports, relevant laboratory and other test results, and collaboration with appropriate care team members and the patient's provider was performed as part of comprehensive patient evaluation and provision of chronic care management services.    I initiated and established the plan of care for Anna Clay during one on one collaboration with my clinical care management colleague Daneen Schick BSW who is also engaged with this patient to address social work needs.   Goals Addressed            This Visit's Progress     Patient Stated   . "I don't want to give up my doctor" (pt-stated)       Current Barriers:  . Limited social support  . Limited transportation resources from patients home to physician office . Concern that her son and daughter in law have to take the day off work to provide transportation for appointments  Clinical Social Work Clinical Goal(s):  Marland Kitchen Over the next 30 days, client will work with SW to address concerns related to transportation barriers  Interventions: . Patient interviewed and appropriate assessments performed . Discussed plans with patient for ongoing care management follow up and provided patient with direct contact information for care management team . Collaborated with RN Case Manager re: patient enrollment into CCM program  . Informed the patient of barriers to transportation resources [place] . Advised the patient this SW would investigate resource options and return patient call at a later date   Patient Self Care Activities:  . Attends all scheduled provider appointments . Calls pharmacy for medication refills . Attends church or other social activities . Calls provider office for new concerns or questions  Initial goal documentation       Other   . Assist with Disease Management and Care Coordination       Current Barriers:  Marland Kitchen Knowledge Barriers related to resources and support available to address needs related to chronic disease management and community resources  Case Manager Clinical Goal(s):  Marland Kitchen Over the next 30 days, patient will work with the CCM team to address needs related to patient specified disease management and community resources.   Interventions:  . Collaborated with BSW and initiated plan of care to address needs related to patient specified chronic disease management (pending) and community resources  Patient Self Care Activities:  . Attends all scheduled provider appointments . Calls pharmacy for medication refills . Attends church or other social activities . Calls provider office for new concerns or questions  Initial goal documentation         Telephone follow up appointment with CCM team member scheduled for: week of 05/31/18  [name], [facility] Care Management Coordinator [place] [facility]  Direct Phone: [phone]

## 2018-05-26 ENCOUNTER — Ambulatory Visit: Payer: Self-pay

## 2018-05-26 DIAGNOSIS — I272 Pulmonary hypertension, unspecified: Secondary | ICD-10-CM

## 2018-05-26 DIAGNOSIS — E1165 Type 2 diabetes mellitus with hyperglycemia: Secondary | ICD-10-CM

## 2018-05-26 NOTE — Chronic Care Management (AMB) (Signed)
  Chronic Care Management    Clinical Social Work Follow Up Note  05/26/2018 Name: Anna Clay MRN: [id number] DOB: July 26, 1950  Anna Clay is a 68 y.o. year old female who is a primary care patient of Anna Chard, MD. The CCM team was consulted for assistance with Transportation Resources.   Review of patient status, including review of consultants reports, other relevant assessments, and collaboration with appropriate care team members and the patient's provider was performed as part of comprehensive patient evaluation and provision of chronic care management services.     Goals Addressed            This Visit's Progress     Patient Stated   . "I don't want to give up my doctor" (pt-stated)       Current Barriers:  . Limited social support  . Limited transportation resources from patients home to physician office . Concern that her son and daughter in law have to take the day off work to provide transportation for appointments  Clinical Social Work Clinical Goal(s):  Marland Kitchen Over the next 30 days, client will work with SW to address concerns related to transportation barriers  Interventions: . Researched local resources including public transit and private program to provide transportation from [place] to [place] - unable to identify services . Scheduled phone call follow up with the patient in the next week to review options of riding with her daughter in law who works in [place]  Patient [place]:  . Attends all scheduled provider appointments . Calls pharmacy for medication refills . Attends church or other social activities . Calls provider office for new concerns or questions  Please see past updates related to this goal by clicking on the "Past Updates" button in the selected goal          Follow Up Plan: SW will follow up with patient by phone over the next 7-10 days.   Anna Clay, BSW, CDP Anna Clay / Anna [facility] Care Management Social Worker  [phone]  Total time spent performing care coordination and/or care management activities with the patient by phone or face to face = 15 minutes.

## 2018-05-26 NOTE — Patient Instructions (Signed)
Social Worker Visit Information  Goals we discussed today:  Goals Addressed            This Visit's Progress     Patient Stated   . "I don't want to give up my doctor" (pt-stated)       Current Barriers:  . Limited social support  . Limited transportation resources from patients home to physician office . Concern that her son and daughter in law have to take the day off work to provide transportation for appointments  Clinical Social Work Clinical Goal(s):  Marland Kitchen Over the next 30 days, client will work with SW to address concerns related to transportation barriers  Interventions: . Researched local resources including public transit and private program to provide transportation from [place] to [place] - unable to identify services . Scheduled phone call follow up with the patient in the next week to review options of riding with her daughter in law who works in [place]  Patient [place]:  . Attends all scheduled provider appointments . Calls pharmacy for medication refills . Attends church or other social activities . Calls provider office for new concerns or questions  Please see past updates related to this goal by clicking on the "Past Updates" button in the selected goal          Materials Provided: No. Patient not reached.  Follow Up Plan: SW will follow up with patient by phone over the next 7-10 days   Daneen Schick, [place], CDP [facility] / [place] Management Social Worker [phone]

## 2018-06-02 ENCOUNTER — Telehealth: Payer: Self-pay

## 2018-06-02 ENCOUNTER — Ambulatory Visit: Payer: Self-pay

## 2018-06-02 DIAGNOSIS — E1165 Type 2 diabetes mellitus with hyperglycemia: Secondary | ICD-10-CM

## 2018-06-02 DIAGNOSIS — I272 Pulmonary hypertension, unspecified: Secondary | ICD-10-CM

## 2018-06-02 NOTE — Chronic Care Management (AMB) (Signed)
  Chronic Care Management   Outreach Note  06/02/2018 Name: Anna Clay MRN: [id number] DOB: 1950-12-01  Referred by: Glendale Chard, MD Reason for referral : Care Coordination   An unsuccessful telephone outreach was attempted today. The patient was referred to the case management team by for assistance with chronic care management and care coordination.   Follow Up Plan: A HIPPA compliant phone message was left for the patient providing contact information and requesting a return call.  The CM team will reach out to the patient again over the next 7 days.   Daneen Schick, BSW, CDP [facility] / [facility] Care Management Social Worker [phone]  Total time spent performing care coordination and/or care management activities with the patient by phone or face to face = 3 minutes.

## 2018-06-03 ENCOUNTER — Telehealth: Payer: Self-pay

## 2018-06-04 ENCOUNTER — Ambulatory Visit (INDEPENDENT_AMBULATORY_CARE_PROVIDER_SITE_OTHER): Payer: Medicare Other

## 2018-06-04 DIAGNOSIS — E1165 Type 2 diabetes mellitus with hyperglycemia: Secondary | ICD-10-CM

## 2018-06-04 DIAGNOSIS — I272 Pulmonary hypertension, unspecified: Secondary | ICD-10-CM

## 2018-06-04 DIAGNOSIS — M25559 Pain in unspecified hip: Secondary | ICD-10-CM | POA: Diagnosis not present

## 2018-06-04 DIAGNOSIS — E669 Obesity, unspecified: Secondary | ICD-10-CM

## 2018-06-04 NOTE — Chronic Care Management (AMB) (Cosign Needed)
  Chronic Care Management   Note  06/04/2018 Name: Anna Clay MRN: [id number] DOB: 10-30-1950   Chronic Care Management   Outreach Note  06/04/2018 Name: Anna Clay MRN: [id number] DOB: [birth year]  Referred by: Glendale Chard, MD Reason for referral : Chronic Care Management (INITIAL CCM RN FOLLOW UP)   An unsuccessful telephone outreach was attempted today. The patient was referred to the case management team by Glendale Chard MD for assistance with chronic care management and care coordination.   Follow Up Plan: The CCM team will reach out to the patient again over the next 5-7 days.    [name], RN,CCM Care Management Coordinator [place] [facility]  Direct Phone: [phone]

## 2018-06-07 ENCOUNTER — Ambulatory Visit: Payer: Medicare Other

## 2018-06-07 DIAGNOSIS — E1165 Type 2 diabetes mellitus with hyperglycemia: Secondary | ICD-10-CM

## 2018-06-07 DIAGNOSIS — I272 Pulmonary hypertension, unspecified: Secondary | ICD-10-CM

## 2018-06-07 NOTE — Chronic Care Management (AMB) (Signed)
  Chronic Care Management    Clinical Social Work Follow Up Note  06/07/2018 Name: Anna Clay MRN: [id number] DOB: [birth year]  [name] is a 68 y.o. year old female who is a primary care patient of Glendale Chard, MD. The CCM team was consulted for assistance with Transportation Resources.   Review of patient status, including review of consultants reports, other relevant assessments, and collaboration with appropriate care team members and the patient's provider was performed as part of comprehensive patient evaluation and provision of chronic care management services.    I placed a follow up call to the patient on today's date to assess progress of below stated goal.   Goals Addressed            This Visit's Progress     Patient Stated   . COMPLETED: "I don't want to give up my doctor" (pt-stated)       Current Barriers:  . Limited social support  . Limited transportation resources from patients home to physician office . Concern that her son and daughter in law have to take the day off work to provide transportation for appointments  Clinical Social Work Clinical Goal(s):  Marland Kitchen Over the next 30 days, client will work with SW to address concerns related to transportation barriers  Interventions: . Telephonic follow up by CCM SW to the patient to discuss transportation options . Assessed the patient for willingness to ride with her son to work then use the [place] bus for transportation to physician office - the patient reports her son is not okay with her riding a bus sytem . Assessed the patient for willingness to utilize a shared ride service such as [employer] or [place] - the patient reports her son is also against her accessing one of these programs . Advised the patient that some Medicare Advantage plans have transportation benefits but they contract with ride sharing programs . Encouraged the patient that if she is interested in switching Medicare plans during open enrollment to meet  with a SHIPP counselor to review plans and select one that best fits her needs . Determined the patient will continue to use her son and/or daughter-in-law for transportation to and from physician appointments. The patient indicated she has local friends in [place] that may be open to her visiting on physician appointment days so her son may return to work and she may socialize   Patient Self Care Activities:  . Attends all scheduled provider appointments . Calls pharmacy for medication refills . Attends church or other social activities . Calls provider office for new concerns or questions  Please see past updates related to this goal by clicking on the "Past Updates" button in the selected goal          Follow Up Plan: No CCM SW follow up planned at this time. CCM SW will remain an active member of the patients care team. The patient is encouraged to contact CCM SW for any future SW needs.   Daneen Schick, BSW, CDP [facility] / [facility] Care Management Social Worker [phone]  Total time spent performing care coordination and/or care management activities with the patient by phone or face to face = 25 minutes.

## 2018-06-07 NOTE — Patient Instructions (Signed)
Social Worker Visit Information  Goals we discussed today:  Goals Addressed            This Visit's Progress     Patient Stated   . COMPLETED: "I don't want to give up my doctor" (pt-stated)       Current Barriers:  . Limited social support  . Limited transportation resources from patients home to physician office . Concern that her son and daughter in law have to take the day off work to provide transportation for appointments  Clinical Social Work Clinical Goal(s):  Marland Kitchen Over the next 30 days, client will work with SW to address concerns related to transportation barriers  Interventions: . Telephonic follow up by CCM SW to the patient to discuss transportation options . Assessed the patient for willingness to ride with her son to work then use the [place] bus for transportation to physician office - the patient reports her son is not okay with her riding a bus sytem . Assessed the patient for willingness to utilize a shared ride service such as [place] [employer] or [place] - the patient reports her son is also against her accessing one of these programs . Advised the patient that some Medicare Advantage plans have transportation benefits but they contract with ride sharing programs . Encouraged the patient that if she is interested in switching Medicare plans during open enrollment to meet with a SHIPP counselor to review plans and select one that best fits her needs . Determined the patient will continue to use her son and/or daughter-in-law for transportation to and from physician appointments. The patient indicated she has local friends in [place] that may be open to her visiting on physician appointment days so her son may return to work and she may socialize   Patient Self Care Activities:  . Attends all scheduled provider appointments . Calls pharmacy for medication refills . Attends church or other social activities . Calls provider office for new concerns or questions  Please see past  updates related to this goal by clicking on the "Past Updates" button in the selected goal          Materials Provided: No: Patient declined  Follow Up Plan: No CCM SW follow up planned at this time.    Daneen Schick, BSW, CDP [facility] / [facility] Care Management Social Worker [phone]

## 2018-06-15 ENCOUNTER — Telehealth: Payer: Self-pay

## 2018-06-16 ENCOUNTER — Telehealth: Payer: Medicare Other

## 2018-06-16 ENCOUNTER — Other Ambulatory Visit: Payer: Self-pay

## 2018-06-16 ENCOUNTER — Ambulatory Visit: Payer: Self-pay

## 2018-06-16 DIAGNOSIS — E1165 Type 2 diabetes mellitus with hyperglycemia: Secondary | ICD-10-CM

## 2018-06-16 DIAGNOSIS — I272 Pulmonary hypertension, unspecified: Secondary | ICD-10-CM

## 2018-06-16 DIAGNOSIS — G4733 Obstructive sleep apnea (adult) (pediatric): Secondary | ICD-10-CM

## 2018-06-17 ENCOUNTER — Telehealth: Payer: Self-pay

## 2018-06-17 NOTE — Chronic Care Management (AMB) (Addendum)
Chronic Care Management   Initial Visit Note  06/16/2018 Name: Anna Clay MRN: [id number] DOB: 28-Feb-1950  Referred by: Glendale Chard, MD Reason for referral : Chronic Care Management (#2 INITIAL CCM RN CM Telephone Follow Up )   Anna Clay is a 68 y.o. year old female who is a primary care patient of Glendale Chard, MD. The CCM team was consulted for assistance with chronic disease management and care coordination needs.   Review of patient status, including review of consultants reports, relevant laboratory and other test results, and collaboration with appropriate care team members and the patient's provider was performed as part of comprehensive patient evaluation and provision of chronic care management services.    I spoke with Anna Clay by telephone today to establish her CCM plan of care.   Objective:  Lab Results  Component Value Date   HGBA1C 7.3 (H) 05/19/2018   HGBA1C 7.1 (H) 02/17/2018   HGBA1C 7.7 (H) 10/05/2013   Lab Results  Component Value Date   MICROALBUR 150 02/17/2018   LDLCALC 100 (H) 05/19/2018   CREATININE 1.11 (H) 05/19/2018   BP Readings from Last 3 Encounters:  05/19/18 118/66  02/17/18 (!) 142/70  12/05/16 135/68    Goals Addressed      Patient Stated    "I am unable to walk long distances due to having hip pain" (pt-stated)       Current Barriers:   Impaired Physical Mobility secondary to hip pain   High Risk for Falls, uses cane with ambulation   Obesity   Nurse Case Manager Clinical Goal(s):   Over the next 30 days, patient will verbalize understanding of plan for in-home PT start date for evaluation and treatment of impaired physical mobility.   Over the next 90 days, patient will verbalize adherence to following her HEP as directed by PT and will have improved physical mobility, strengthening and overall stamina.   CCM RN CM Interventions:  Completed on 06/16/18: completed call with patient   Evaluation of current  treatment plan related to hip pain, impaired physical mobility and patient's adherence to plan as established by provider  Discussed and assessed for patient's current home exercise regimen -- pt is not able to tolerate long distance walking and short distances and stair climbing--pt plans to start water aerobics once her son installs a pool, planned for the near future  Mailed printed education materials related to safe water aerobics for patient to review - pt is aware  Provided education to patient re: benefits from receiving PT to help determine a safe HEP, help improve balance, strengthening and overall stamina  Collaborated with Dr. Baird Cancer regarding referral for in home PT  Discussed plans with patient for ongoing care management follow up and provided patient with direct contact information for care management team  Provided RNCM contact # and discussed nurse availability  Scheduled a CCM RN CM telephone follow up with patient for 1-2 weeks  Patient Self Care Activities:   Self administers medications as prescribed  Attends all scheduled provider appointments  Calls pharmacy for medication refills  Attends church or other social activities  Performs ADL's independently  Performs IADL's independently  Calls provider office for new concerns or questions  Initial goal documentation     "I need help getting my CPAP supplies" (pt-stated)       Current Barriers:   Inability to purchase new CPAP supplies due to need for new MD Rx  Nurse Case Manager Clinical Goal(s):  Over the next 30 days, patient will verbalize understanding of plan for order and purchase of Respro CPAP supplies.   CCM RN CM Interventions:  Completed on 06/16/18: completed call with patient   Evaluation of current treatment plan related to indication and usage of CPAP and patient's adherence to plan as established by provider.  Collaborated with [place] [employer] CSR regarding what is needed to  resume purchase order for Respro CPAP supplies  Discussed plans with patient for ongoing care management follow up and provided patient with direct contact information for care management team   Sent an in basket message to Dr. Baird Cancer, requesting a new Rx for Respro CPAP supplies; provided fax # [phone]  Provided RN CM contact # and discussed nurse availability  Scheduled a CCM RN CM follow up call for 1-2 weeks   Patient Self Care Activities:   Self administers medications as prescribed  Attends all scheduled provider appointments  Calls pharmacy for medication refills  Attends church or other social activities  Performs ADL's independently  Performs IADL's independently  Calls provider office for new concerns or questions  Initial goal documentation     "I would appreciate learning more about how to meal plan" (pt-stated)       Current Barriers:   Knowledge Deficits related to Diabetes disease process and Self Health management  Nurse Case Manager Clinical Goal(s):   Over the next 30 days, patient will work with CCM RN CM  to address needs related to meal planning and basic disease process for DMII.   CCM RN CM Interventions:  Completed on 06/16/18: completed call with patient   Evaluation of current treatment plan related to Diabetes disease management  and patient's adherence to plan as established by provider.  Provided education to patient re: basic disease process related diabetes; discussed Meal planning using the plate method; discussed current A1C and discussed target A1C  Reviewed medications with patient and discussed importance of taking prescribed medications exactly as prescribed for best effectiveness  Discussed plans with patient for ongoing care management follow up and provided patient with direct contact information for care management team  Provided patient with printed  educational materials related to Meal Planning, Know Your A1C, Signs  and Symptoms of Hypo/Hypoglycemia  Advised patient, providing education and rationale, to check cbg daily before meals  and record, calling RN CM and or Dr. Baird Cancer for findings outside established parameters.     Provided RNCM contact # and discussed nurse availability  Scheduled a CCM follow-up call with patient for 1-2 weeks  Patient Self Care Activities:   Self administers medications as prescribed  Attends all scheduled provider appointments  Calls pharmacy for medication refills  Attends church or other social activities  Performs ADL's independently  Performs IADL's independently  Calls provider office for new concerns or questions  Initial goal documentation      Other    COMPLETED: Assist with Disease Management and Care Coordination       Current Barriers:   Knowledge Barriers related to resources and support available to address needs related to chronic disease management and community resources  Case Manager Clinical Goal(s):   Over the next 30 days, patient will work with the CCM team to address needs related to patient specified disease management and community resources. Goal Met  CCM RN CM Interventions:  Completed on 06/16/18: completed call with patient   Established a plan of care related to patient specified chronic disease management and Care Coordination Needs  Patient Self Care Activities:   Attends all scheduled provider appointments  Calls pharmacy for medication refills  Attends church or other social activities  Calls provider office for new concerns or questions  Please see past updates related to this goal by clicking on the "Past Updates" button in the selected goal         Telephone follow up appointment with CCM team member scheduled for: 1-2 weeks  [name], [facility] Care Management Coordinator [place] [facility]  Direct Phone: [phone]

## 2018-06-17 NOTE — Patient Instructions (Signed)
Visit Information  Goals Addressed      Patient Stated   . "I am unable to walk long distances due to having hip pain" (pt-stated)       Current Barriers:  . Impaired Physical Mobility secondary to hip pain  . High Risk for Falls, uses cane with ambulation  . Obesity   Nurse Case Manager Clinical Goal(s):  Marland Kitchen Over the next 30 days, patient will verbalize understanding of plan for in-home PT start date for evaluation and treatment of impaired physical mobility.  . Over the next 90 days, patient will verbalize adherence to following her HEP as directed by PT and will have improved physical mobility, strengthening and overall stamina.   CCM RN CM Interventions:  Completed on 06/16/18: completed call with patient  . Evaluation of current treatment plan related to hip pain, impaired physical mobility and patient's adherence to plan as established by provider . Discussed and assessed for patient's current home exercise regimen -- pt is not able to tolerate long distance walking and short distances and stair climbing--pt plans to start water aerobics once her son installs a pool, planned for the near future . Mailed printed education materials related to safe water aerobics for patient to review - pt is aware . Provided education to patient re: benefits from receiving PT to help determine a safe HEP, help improve balance, strengthening and overall stamina . Collaborated with Dr. Baird Cancer regarding referral for in home PT . Discussed plans with patient for ongoing care management follow up and provided patient with direct contact information for care management team . Provided RNCM contact # and discussed nurse availability . Scheduled a CCM RN CM telephone follow up with patient for 1-2 weeks  Patient Self Care Activities:  . Self administers medications as prescribed . Attends all scheduled provider appointments . Calls pharmacy for medication refills . Attends church or other social  activities . Performs ADL's independently . Performs IADL's independently . Calls provider office for new concerns or questions  Initial goal documentation     . "I need help getting my CPAP supplies" (pt-stated)       Current Barriers:  . Inability to purchase new CPAP supplies due to need for new MD Rx  Nurse Case Manager Clinical Goal(s):  Marland Kitchen Over the next 30 days, patient will verbalize understanding of plan for order and purchase of Respro CPAP supplies.   CCM RN CM Interventions:  Completed on 06/16/18: completed call with patient  . Evaluation of current treatment plan related to indication and usage of CPAP and patient's adherence to plan as established by provider. Nash Dimmer with [place] [employer] CSR regarding what is needed to resume purchase order for Respro CPAP supplies . Discussed plans with patient for ongoing care management follow up and provided patient with direct contact information for care management team  . Sent an in basket message to Dr. Baird Cancer, requesting a new Rx for Respro CPAP supplies; provided fax # [phone] . Provided RN CM contact # and discussed nurse availability . Scheduled a CCM RN CM follow up call for 1-2 weeks   Patient Self Care Activities:  . Self administers medications as prescribed . Attends all scheduled provider appointments . Calls pharmacy for medication refills . Attends church or other social activities . Performs ADL's independently . Performs IADL's independently . Calls provider office for new concerns or questions  Initial goal documentation     . "I would appreciate learning more about how to  meal plan" (pt-stated)       Current Barriers:  Marland Kitchen Knowledge Deficits related to Diabetes disease process and Self Health management  Nurse Case Manager Clinical Goal(s):  Marland Kitchen Over the next 30 days, patient will work with CCM RN CM  to address needs related to meal planning and basic disease process for DMII.   CCM RN CM  Interventions:  Completed on 06/16/18: completed call with patient  . Evaluation of current treatment plan related to Diabetes disease management  and patient's adherence to plan as established by provider. . Provided education to patient re: basic disease process related diabetes; discussed Meal planning using the plate method; discussed current A1C and discussed target A1C . Reviewed medications with patient and discussed importance of taking prescribed medications exactly as prescribed for best effectiveness . Discussed plans with patient for ongoing care management follow up and provided patient with direct contact information for care management team . Provided patient with printed  educational materials related to Meal Planning, Know Your A1C, Signs and Symptoms of Hypo/Hypoglycemia . Advised patient, providing education and rationale, to check cbg daily before meals  and record, calling RN CM and or Dr. Baird Cancer for findings outside established parameters.    . Provided RNCM contact # and discussed nurse availability . Scheduled a CCM follow-up call with patient for 1-2 weeks  Patient Self Care Activities:  . Self administers medications as prescribed . Attends all scheduled provider appointments . Calls pharmacy for medication refills . Attends church or other social activities . Performs ADL's independently . Performs IADL's independently . Calls provider office for new concerns or questions  Initial goal documentation       Other   . COMPLETED: Assist with Disease Management and Care Coordination       Current Barriers:  Marland Kitchen Knowledge Barriers related to resources and support available to address needs related to chronic disease management and community resources  Case Manager Clinical Goal(s):  Marland Kitchen Over the next 30 days, patient will work with the CCM team to address needs related to patient specified disease management and community resources. Goal Met  CCM RN CM  Interventions:  Completed on 06/16/18: completed call with patient   Established a plan of care related to patient specified chronic disease management and Care Coordination Needs  Patient Self Care Activities:  . Attends all scheduled provider appointments . Calls pharmacy for medication refills . Attends church or other social activities . Calls provider office for new concerns or questions  Please see past updates related to this goal by clicking on the "Past Updates" button in the selected goal         The patient verbalized understanding of instructions provided today and declined a print copy of patient instruction materials.   Telephone follow up appointment with CCM team member scheduled for: 1-2 weeks  [name], [facility] Care Management Coordinator [place] [facility]  Direct Phone: [phone]

## 2018-06-19 ENCOUNTER — Other Ambulatory Visit: Payer: Self-pay | Admitting: Internal Medicine

## 2018-06-21 ENCOUNTER — Telehealth: Payer: Self-pay

## 2018-06-21 ENCOUNTER — Ambulatory Visit (INDEPENDENT_AMBULATORY_CARE_PROVIDER_SITE_OTHER): Payer: Medicare Other

## 2018-06-21 DIAGNOSIS — I272 Pulmonary hypertension, unspecified: Secondary | ICD-10-CM

## 2018-06-21 DIAGNOSIS — E1165 Type 2 diabetes mellitus with hyperglycemia: Secondary | ICD-10-CM

## 2018-06-21 DIAGNOSIS — G4733 Obstructive sleep apnea (adult) (pediatric): Secondary | ICD-10-CM

## 2018-06-22 ENCOUNTER — Telehealth: Payer: Medicare Other

## 2018-06-22 ENCOUNTER — Other Ambulatory Visit: Payer: Self-pay

## 2018-06-23 NOTE — Chronic Care Management (AMB) (Signed)
Chronic Care Management   Follow Up Note   06/22/2018 Name: Anna Clay MRN: [id number] DOB: 17-Sep-1950  Referred by: Glendale Chard, MD Reason for referral : Chronic Care Management (CCM RNCM Telephone )   Anna Clay is a 68 y.o. year old female who is a primary care patient of Glendale Chard, MD. The CCM team was consulted for assistance with chronic disease management and care coordination needs.    Review of patient status, including review of consultants reports, relevant laboratory and other test results, and collaboration with appropriate care team members and the patient's provider was performed as part of comprehensive patient evaluation and provision of chronic care management services.    I spoke with [name] by telephone today to f/u on her request for assistance with her CPAP supplies and in-home PT.   Goals Addressed      Patient Stated   . "I am unable to walk long distances due to having hip pain" (pt-stated)       Current Barriers:  . Impaired Physical Mobility secondary to hip pain  . High Risk for Falls, uses cane with ambulation  . Obesity   Nurse Case Manager Clinical Goal(s):  Marland Kitchen Over the next 30 days, patient will verbalize understanding of plan for in-home PT start date for evaluation and treatment of impaired physical mobility.  . Over the next 90 days, patient will verbalize adherence to following her HEP as directed by PT and will have improved physical mobility, strengthening and overall stamina.   CCM RN CM Interventions:  Completed on 06/22/18: completed call with patient  . Evaluation of current treatment plan related to hip pain, impaired physical mobility and patient's adherence to plan as established by provider . Advised patient that [facility] provided [facility] services her zip code area  . Discussed patient would like to pursue initiating in home PT asap . Advised of collaboration with Dr. Baird Cancer and she will send orders accordingly .  Instructed patient once orders have been received, a HH PT should reach out to her to schedule a PT evaluation and will make recommendations for treatment - pt verbalizes understanding  . Discussed plans with patient for ongoing care management follow up and provided patient with direct contact information for care management team  Patient Self Care Activities:  . Self administers medications as prescribed . Attends all scheduled provider appointments . Calls pharmacy for medication refills . Attends church or other social activities . Performs ADL's independently . Performs IADL's independently . Calls provider office for new concerns or questions  Please see past updates related to this goal by clicking on the "Past Updates" button in the selected goal     . "I need help getting my CPAP supplies" (pt-stated)       Current Barriers:  . Inability to purchase new CPAP supplies due to need for new MD Rx  Nurse Case Manager Clinical Goal(s):  Marland Kitchen Over the next 30 days, patient will verbalize understanding of plan for order and purchase of Respro CPAP supplies.   CCM RN CM Interventions:  Completed on 06/22/18: completed call with patient  . Evaluation of current treatment plan related to indication and usage of CPAP and patient's adherence to plan as established by provider . Advised patient Dr. Baird Cancer sent in new orders for her CPAP supplies to [place] - pt verbalizes understanding and is appreciative of the assistance . Discussed plans with patient for ongoing care management follow up and provided  patient with direct contact information for care management team   Patient Self Care Activities:  . Self administers medications as prescribed . Attends all scheduled provider appointments . Calls pharmacy for medication refills . Attends church or other social activities . Performs ADL's independently . Performs IADL's independently . Calls provider office for new concerns or questions   Please see past updates related to this goal by clicking on the "Past Updates" button in the selected goal         Telephone follow up appointment with care management team member scheduled for: 06/30/18   [name], [facility] Care Management Coordinator [place] [facility]  Direct Phone: [phone]

## 2018-06-23 NOTE — Patient Instructions (Signed)
Visit Information  Goals Addressed      Patient Stated   . "I am unable to walk long distances due to having hip pain" (pt-stated)       Current Barriers:  . Impaired Physical Mobility secondary to hip pain  . High Risk for Falls, uses cane with ambulation  . Obesity   Nurse Case Manager Clinical Goal(s):  Marland Kitchen Over the next 30 days, patient will verbalize understanding of plan for in-home PT start date for evaluation and treatment of impaired physical mobility.  . Over the next 90 days, patient will verbalize adherence to following her HEP as directed by PT and will have improved physical mobility, strengthening and overall stamina.   CCM RN CM Interventions:  Completed on 06/22/18: completed call with patient  . Evaluation of current treatment plan related to hip pain, impaired physical mobility and patient's adherence to plan as established by provider . Advised patient that [facility] provided [facility] services her zip code area  . Discussed patient would like to pursue initiating in home PT asap . Advised of collaboration with Dr. Baird Cancer and she will send orders accordingly . Instructed patient once orders have been received, a HH PT should reach out to her to schedule a PT evaluation and will make recommendations for treatment - pt verbalizes understanding  . Discussed plans with patient for ongoing care management follow up and provided patient with direct contact information for care management team  Patient Self Care Activities:  . Self administers medications as prescribed . Attends all scheduled provider appointments . Calls pharmacy for medication refills . Attends church or other social activities . Performs ADL's independently . Performs IADL's independently . Calls provider office for new concerns or questions  Please see past updates related to this goal by clicking on the "Past Updates" button in the selected goal      . "I need help getting my CPAP supplies"  (pt-stated)       Current Barriers:  . Inability to purchase new CPAP supplies due to need for new MD Rx  Nurse Case Manager Clinical Goal(s):  Marland Kitchen Over the next 30 days, patient will verbalize understanding of plan for order and purchase of Respro CPAP supplies.   CCM RN CM Interventions:  Completed on 06/22/18: completed call with patient  . Evaluation of current treatment plan related to indication and usage of CPAP and patient's adherence to plan as established by provider . Advised patient Dr. Baird Cancer sent in new orders for her CPAP supplies to [place] - pt verbalizes understanding and is appreciative of the assistance . Discussed plans with patient for ongoing care management follow up and provided patient with direct contact information for care management team   Patient Self Care Activities:  . Self administers medications as prescribed . Attends all scheduled provider appointments . Calls pharmacy for medication refills . Attends church or other social activities . Performs ADL's independently . Performs IADL's independently . Calls provider office for new concerns or questions  Please see past updates related to this goal by clicking on the "Past Updates" button in the selected goal        The patient verbalized understanding of instructions provided today and declined a print copy of patient instruction materials.     [name], RN,CCM Care Management Coordinator [place] [facility]  Direct Phone: [phone]

## 2018-06-30 ENCOUNTER — Telehealth: Payer: Self-pay

## 2018-07-03 ENCOUNTER — Other Ambulatory Visit: Payer: Self-pay | Admitting: Internal Medicine

## 2018-07-03 ENCOUNTER — Other Ambulatory Visit: Payer: Self-pay | Admitting: Cardiology

## 2018-07-27 ENCOUNTER — Other Ambulatory Visit: Payer: Self-pay | Admitting: Internal Medicine

## 2018-07-27 ENCOUNTER — Telehealth: Payer: Medicare Other

## 2018-07-27 ENCOUNTER — Other Ambulatory Visit: Payer: Self-pay

## 2018-07-27 ENCOUNTER — Ambulatory Visit: Payer: Self-pay

## 2018-07-27 DIAGNOSIS — I272 Pulmonary hypertension, unspecified: Secondary | ICD-10-CM

## 2018-07-27 DIAGNOSIS — G4733 Obstructive sleep apnea (adult) (pediatric): Secondary | ICD-10-CM

## 2018-07-27 DIAGNOSIS — M25559 Pain in unspecified hip: Secondary | ICD-10-CM

## 2018-07-27 DIAGNOSIS — E1165 Type 2 diabetes mellitus with hyperglycemia: Secondary | ICD-10-CM

## 2018-07-27 NOTE — Chronic Care Management (AMB) (Signed)
Chronic Care Management   Follow Up Note   07/27/2018 Name: Anna Clay MRN: [id number] DOB: 08-10-1950  Referred by: Anna Chard, MD Reason for referral : Chronic Care Management (CCM RNCM Telephone Follow up )   Anna Clay is a 68 y.o. year old female who is a primary care patient of Anna Chard, MD. The CCM team was consulted for assistance with chronic disease management and care coordination needs.    Review of patient status, including review of consultants reports, relevant laboratory and other test results, and collaboration with appropriate care team members and the patient's provider was performed as part of comprehensive patient evaluation and provision of chronic care management services.   I spoke with [name] by telephone today to follow up on her CPAP supplies and in home PT.   Goals Addressed      Patient Stated   . "I am unable to walk long distances due to having hip pain" (pt-stated)       Current Barriers:  . Impaired Physical Mobility secondary to hip pain  . High Risk for Falls, uses cane with ambulation  . Obesity   Nurse Case Manager Clinical Goal(s):  Anna Clay Over the next 60 days, patient will verbalize understanding of plan for in-home PT start date for evaluation and treatment of impaired physical mobility. 07/27/18 Goal date re-established for 60 days due to COVID treatment delays . Over the next 90 days, patient will verbalize adherence to following her HEP as directed by PT and will have improved physical mobility, strengthening and overall stamina.   CCM RN CM Interventions:  07/27/18: completed call with patient  . Evaluation of current treatment plan related to hip pain, impaired physical mobility and patient's adherence to plan as established by provider . Discussed the in home PT referral has not been sent  . Discussed patient would still like to pursue this service and is agreeable to having PT in home . Reiterated [facility] is noted  to service her zip code area  . Resent an in basket message to Dr. Baird Cancer requesting a referral be sent to [facility] for in home PT  . Advised patient once orders have been received, a HH PT should reach out to her to schedule a PT evaluation and will make recommendations for treatment - pt verbalizes understanding  . Discussed plans with patient for ongoing care management follow up and provided patient with direct contact information for care management team  Patient Self Care Activities:  . Self administers medications as prescribed . Attends all scheduled provider appointments . Calls pharmacy for medication refills . Attends church or other social activities . Performs ADL's independently . Performs IADL's independently . Calls provider office for new concerns or questions  Please see past updates related to this goal by clicking on the "Past Updates" button in the selected goal     . "I need help getting my CPAP supplies" (pt-stated)       Current Barriers:  . Inability to purchase new CPAP supplies due to need for new MD Rx  Nurse Case Manager Clinical Goal(s):  Anna Clay Over the next 30 days, patient will verbalize understanding of plan for order and purchase of Respro CPAP supplies. Goal Met . 07/27/18 Over the next 30 days, patient will have requested and received a reservoir replacement for her CPAP machine   CCM RN CM Interventions:  07/27/18: completed call with patient  . Evaluation of current treatment plan related  to indication and usage of CPAP and patient's adherence to plan as established by provider - pt reports adherence with using her CPAP as directed . Confirmed patient received her CPAP supplies; discussed patient will need her reservoir replaced and will call [place] to make the request . Encouraged patient to contact the CCM team and or Dr. Baird Cancer if further assistance is needed   . Discussed plans with patient for ongoing care management follow up and  provided patient with direct contact information for care management team   Patient Self Care Activities:  . Self administers medications as prescribed . Attends all scheduled provider appointments . Calls pharmacy for medication refills . Attends church or other social activities . Performs ADL's independently . Performs IADL's independently . Calls provider office for new concerns or questions  Please see past updates related to this goal by clicking on the "Past Updates" button in the selected goal        The care management team will reach out to the patient again over the next 7-10 days.   Anna Merino, RN, BSN, CCM Care Management Coordinator [place] [facility]  Direct Phone: [phone]

## 2018-07-27 NOTE — Patient Instructions (Signed)
Visit Information  Goals Addressed      Patient Stated   . "I am unable to walk long distances due to having hip pain" (pt-stated)       Current Barriers:  . Impaired Physical Mobility secondary to hip pain  . High Risk for Falls, uses cane with ambulation  . Obesity   Nurse Case Manager Clinical Goal(s):  Marland Kitchen Over the next 60 days, patient will verbalize understanding of plan for in-home PT start date for evaluation and treatment of impaired physical mobility. 07/27/18 Goal date re-established for 60 days due to COVID treatment delays . Over the next 90 days, patient will verbalize adherence to following her HEP as directed by PT and will have improved physical mobility, strengthening and overall stamina.   CCM RN CM Interventions:  07/27/18: completed call with patient  . Evaluation of current treatment plan related to hip pain, impaired physical mobility and patient's adherence to plan as established by provider . Discussed the in home PT referral has not been sent  . Discussed patient would still like to pursue this service and is agreeable to having PT in home . Reiterated [facility] is noted to service her zip code area  . Resent an in basket message to Dr. Baird Cancer requesting a referral be sent to [facility] for in home PT  . Advised patient once orders have been received, a HH PT should reach out to her to schedule a PT evaluation and will make recommendations for treatment - pt verbalizes understanding  . Discussed plans with patient for ongoing care management follow up and provided patient with direct contact information for care management team   Patient Self Care Activities:  . Self administers medications as prescribed . Attends all scheduled provider appointments . Calls pharmacy for medication refills . Attends church or other social activities . Performs ADL's independently . Performs IADL's independently . Calls provider office for new concerns or  questions  Please see past updates related to this goal by clicking on the "Past Updates" button in the selected goal       . "I need help getting my CPAP supplies" (pt-stated)       Current Barriers:  . Inability to purchase new CPAP supplies due to need for new MD Rx  Nurse Case Manager Clinical Goal(s):  Marland Kitchen Over the next 30 days, patient will verbalize understanding of plan for order and purchase of Respro CPAP supplies. Goal Met . 07/27/18 Over the next 30 days, patient will have requested and received a reservoir replacement for her CPAP machine   CCM RN CM Interventions:  07/27/18: completed call with patient  . Evaluation of current treatment plan related to indication and usage of CPAP and patient's adherence to plan as established by provider - pt reports adherence with using her CPAP as directed . Confirmed patient received her CPAP supplies; discussed patient will need her reservoir replaced and will call [place] to make the request . Encouraged patient to contact the CCM team and or Dr. Baird Cancer if further assistance is needed   . Discussed plans with patient for ongoing care management follow up and provided patient with direct contact information for care management team   Patient Self Care Activities:  . Self administers medications as prescribed . Attends all scheduled provider appointments . Calls pharmacy for medication refills . Attends church or other social activities . Performs ADL's independently . Performs IADL's independently . Calls provider office for new concerns  or questions  Please see past updates related to this goal by clicking on the "Past Updates" button in the selected goal       The patient verbalized understanding of instructions provided today and declined a print copy of patient instruction materials.   The care management team will reach out to the patient again over the next 7-10 days.   [name], RN, BSN, CCM Care Management  Coordinator [place] [facility]  Direct Phone: [phone]

## 2018-08-02 ENCOUNTER — Other Ambulatory Visit: Payer: Self-pay | Admitting: Cardiology

## 2018-08-02 NOTE — Telephone Encounter (Signed)
Please fill if necessary

## 2018-08-03 ENCOUNTER — Other Ambulatory Visit: Payer: Self-pay

## 2018-08-03 ENCOUNTER — Ambulatory Visit: Payer: Self-pay

## 2018-08-03 ENCOUNTER — Telehealth: Payer: Medicare Other

## 2018-08-03 DIAGNOSIS — G4733 Obstructive sleep apnea (adult) (pediatric): Secondary | ICD-10-CM

## 2018-08-03 DIAGNOSIS — I272 Pulmonary hypertension, unspecified: Secondary | ICD-10-CM

## 2018-08-03 DIAGNOSIS — E1165 Type 2 diabetes mellitus with hyperglycemia: Secondary | ICD-10-CM

## 2018-08-03 MED ORDER — METOPROLOL SUCCINATE ER 25 MG PO TB24: 25.0000 mg | ORAL_TABLET | Freq: Every day | ORAL | 0 refills | Status: DC

## 2018-08-03 NOTE — Chronic Care Management (AMB) (Signed)
  Chronic Care Management   Follow Up Note   08/03/2018 Name: Anna Clay MRN: [id number] DOB: [birth year]  Referred by: Anna Chard, MD Reason for referral : Chronic Care Management (CCM RNCM Telephone Follow up )   Anna Clay is a 68 y.o. year old female who is a primary care patient of Anna Chard, MD. The CCM team was consulted for assistance with chronic disease management and care coordination needs.    Review of patient status, including review of consultants reports, relevant laboratory and other test results, and collaboration with appropriate care team members and the patient's provider was performed as part of comprehensive patient evaluation and provision of chronic care management services.    I spoke with Anna Clay by telephone today to follow up on her request for in home PT services.   Goals Addressed      Patient Stated   . "I am unable to walk long distances due to having hip pain" (pt-stated)       Current Barriers:  . Impaired Physical Mobility secondary to hip pain  . High Risk for Falls, uses cane with ambulation  . Obesity   Nurse Case Manager Clinical Goal(s):  Marland Kitchen Over the next 60 days, patient will verbalize understanding of plan for in-home PT start date for evaluation and treatment of impaired physical mobility. 07/27/18 Goal date re-established for 60 days due to COVID treatment delays . Over the next 90 days, patient will verbalize adherence to following her HEP as directed by PT and will have improved physical mobility, strengthening and overall stamina.   CCM RN CM Interventions:  08/03/18: completed call with patient  . Evaluation of current treatment plan related to hip pain, impaired physical mobility and patient's adherence to plan as established by provider . Discussed the in home PT referral has been approved by Anna Clay but may be incomplete . Discussed patient has not been contacted by [facility] PT as of yet . Reiterated [facility] is  noted to service her zip code area  . Sent in basket message to Anna Clay requesting follow up on referral as it appears to be incomplete at this time - per Anna Clay, referral needs Anna Clay signature - advised Anna Clay may be waiting to complete the OV with patient on 08/18/18 before completing the referral  . Discussed plans with patient for ongoing care management follow up and provided patient with direct contact information for care management team  Patient Self Care Activities:  . Self administers medications as prescribed . Attends all scheduled provider appointments . Calls pharmacy for medication refills . Attends church or other social activities . Performs ADL's independently . Performs IADL's independently . Calls provider office for new concerns or questions  Please see past updates related to this goal by clicking on the "Past Updates" button in the selected goal         Telephone follow up appointment with care management team member scheduled for: 08/10/18   Anna Merino, RN, BSN, CCM Care Management Coordinator [place] [facility]  Direct Phone: [phone]

## 2018-08-03 NOTE — Patient Instructions (Signed)
Visit Information  Goals Addressed      Patient Stated   . "I am unable to walk long distances due to having hip pain" (pt-stated)       Current Barriers:  . Impaired Physical Mobility secondary to hip pain  . High Risk for Falls, uses cane with ambulation  . Obesity   Nurse Case Manager Clinical Goal(s):  Marland Kitchen Over the next 60 days, patient will verbalize understanding of plan for in-home PT start date for evaluation and treatment of impaired physical mobility. 07/27/18 Goal date re-established for 60 days due to COVID treatment delays . Over the next 90 days, patient will verbalize adherence to following her HEP as directed by PT and will have improved physical mobility, strengthening and overall stamina.   CCM RN CM Interventions:  08/03/18: completed call with patient  . Evaluation of current treatment plan related to hip pain, impaired physical mobility and patient's adherence to plan as established by provider . Discussed the in home PT referral has been approved by Dr. Baird Cancer but may be incomplete . Discussed patient has not been contacted by [facility] PT as of yet . Reiterated [facility] is noted to service her zip code area  . Sent in basket message to [place] requesting follow up on referral as it appears to be incomplete at this time - per Caleen Jobs, referral needs Dr. Baird Cancer signature - advised Dr. Baird Cancer may be waiting to complete the OV with patient on 08/18/18 before completing the referral  . Discussed plans with patient for ongoing care management follow up and provided patient with direct contact information for care management team  Patient Self Care Activities:  . Self administers medications as prescribed . Attends all scheduled provider appointments . Calls pharmacy for medication refills . Attends church or other social activities . Performs ADL's independently . Performs IADL's independently . Calls provider office for new concerns or  questions  Please see past updates related to this goal by clicking on the "Past Updates" button in the selected goal        The patient verbalized understanding of instructions provided today and declined a print copy of patient instruction materials.   Telephone follow up appointment with care management team member scheduled for: 08/10/18  [name], RN, BSN, CCM Care Management Coordinator [place] [facility]  Direct Phone: [phone]

## 2018-08-06 DIAGNOSIS — E7800 Pure hypercholesterolemia, unspecified: Secondary | ICD-10-CM | POA: Diagnosis not present

## 2018-08-06 DIAGNOSIS — D509 Iron deficiency anemia, unspecified: Secondary | ICD-10-CM | POA: Diagnosis not present

## 2018-08-06 DIAGNOSIS — E1165 Type 2 diabetes mellitus with hyperglycemia: Secondary | ICD-10-CM | POA: Diagnosis not present

## 2018-08-06 DIAGNOSIS — M25552 Pain in left hip: Secondary | ICD-10-CM | POA: Diagnosis not present

## 2018-08-06 DIAGNOSIS — J309 Allergic rhinitis, unspecified: Secondary | ICD-10-CM | POA: Diagnosis not present

## 2018-08-06 DIAGNOSIS — I110 Hypertensive heart disease with heart failure: Secondary | ICD-10-CM | POA: Diagnosis not present

## 2018-08-06 DIAGNOSIS — Z87891 Personal history of nicotine dependence: Secondary | ICD-10-CM | POA: Diagnosis not present

## 2018-08-06 DIAGNOSIS — I50811 Acute right heart failure: Secondary | ICD-10-CM | POA: Diagnosis not present

## 2018-08-06 DIAGNOSIS — E669 Obesity, unspecified: Secondary | ICD-10-CM | POA: Diagnosis not present

## 2018-08-06 DIAGNOSIS — Z7982 Long term (current) use of aspirin: Secondary | ICD-10-CM | POA: Diagnosis not present

## 2018-08-06 DIAGNOSIS — Z794 Long term (current) use of insulin: Secondary | ICD-10-CM | POA: Diagnosis not present

## 2018-08-06 DIAGNOSIS — G4733 Obstructive sleep apnea (adult) (pediatric): Secondary | ICD-10-CM | POA: Diagnosis not present

## 2018-08-06 DIAGNOSIS — E1121 Type 2 diabetes mellitus with diabetic nephropathy: Secondary | ICD-10-CM | POA: Diagnosis not present

## 2018-08-06 DIAGNOSIS — I5030 Unspecified diastolic (congestive) heart failure: Secondary | ICD-10-CM | POA: Diagnosis not present

## 2018-08-06 DIAGNOSIS — I272 Pulmonary hypertension, unspecified: Secondary | ICD-10-CM | POA: Diagnosis not present

## 2018-08-06 DIAGNOSIS — Z9181 History of falling: Secondary | ICD-10-CM | POA: Diagnosis not present

## 2018-08-06 DIAGNOSIS — M109 Gout, unspecified: Secondary | ICD-10-CM | POA: Diagnosis not present

## 2018-08-06 DIAGNOSIS — L84 Corns and callosities: Secondary | ICD-10-CM | POA: Diagnosis not present

## 2018-08-06 DIAGNOSIS — M25551 Pain in right hip: Secondary | ICD-10-CM | POA: Diagnosis not present

## 2018-08-06 DIAGNOSIS — Z6841 Body Mass Index (BMI) 40.0 and over, adult: Secondary | ICD-10-CM | POA: Diagnosis not present

## 2018-08-06 DIAGNOSIS — J9601 Acute respiratory failure with hypoxia: Secondary | ICD-10-CM | POA: Diagnosis not present

## 2018-08-10 ENCOUNTER — Ambulatory Visit: Payer: Self-pay

## 2018-08-10 ENCOUNTER — Telehealth: Payer: Medicare Other

## 2018-08-10 ENCOUNTER — Other Ambulatory Visit: Payer: Self-pay

## 2018-08-10 ENCOUNTER — Telehealth: Payer: Self-pay

## 2018-08-10 DIAGNOSIS — I272 Pulmonary hypertension, unspecified: Secondary | ICD-10-CM

## 2018-08-10 DIAGNOSIS — G4733 Obstructive sleep apnea (adult) (pediatric): Secondary | ICD-10-CM

## 2018-08-10 DIAGNOSIS — E1165 Type 2 diabetes mellitus with hyperglycemia: Secondary | ICD-10-CM

## 2018-08-11 DIAGNOSIS — I110 Hypertensive heart disease with heart failure: Secondary | ICD-10-CM | POA: Diagnosis not present

## 2018-08-11 DIAGNOSIS — E669 Obesity, unspecified: Secondary | ICD-10-CM | POA: Diagnosis not present

## 2018-08-11 DIAGNOSIS — M25551 Pain in right hip: Secondary | ICD-10-CM | POA: Diagnosis not present

## 2018-08-11 DIAGNOSIS — I272 Pulmonary hypertension, unspecified: Secondary | ICD-10-CM | POA: Diagnosis not present

## 2018-08-11 DIAGNOSIS — E1165 Type 2 diabetes mellitus with hyperglycemia: Secondary | ICD-10-CM | POA: Diagnosis not present

## 2018-08-11 DIAGNOSIS — M25552 Pain in left hip: Secondary | ICD-10-CM | POA: Diagnosis not present

## 2018-08-11 NOTE — Patient Instructions (Signed)
Visit Information  Goals Addressed      Patient Stated   . "I am unable to walk long distances due to having hip pain" (pt-stated)       Current Barriers:  . Impaired Physical Mobility secondary to hip pain  . High Risk for Falls, uses cane with ambulation  . Obesity   Nurse Case Manager Clinical Goal(s):  Marland Kitchen Over the next 60 days, patient will verbalize understanding of plan for in-home PT start date for evaluation and treatment of impaired physical mobility. 07/27/18 Goal date re-established for 60 days due to COVID treatment delays . Over the next 90 days, patient will verbalize adherence to following her HEP as directed by PT and will have improved physical mobility, strengthening and overall stamina.   CCM RN CM Interventions:  08/10/18: completed call with patient  . Evaluation of current treatment plan related to hip pain, impaired physical mobility and patient's adherence to plan as established by provider . Confirmed patient has completed her PT evaluation and recommendations for treatment have been placed through [facility] . Patient will receive in home PT twice weekly x 4 weeks to work on strengthening and balance . Discussed the importance of adhering to her prescribed HEP; discussed patient was given a written plan of care including the recommended exercises in which she is adhering . Discussed patient's plan to consider Yoga in the future and to implement deep breathing in her daily exercise routine  . Discussed plans with patient for ongoing care management follow up and provided patient with direct contact information for care management team  Patient Self Care Activities:  . Self administers medications as prescribed . Attends all scheduled provider appointments . Calls pharmacy for medication refills . Attends church or other social activities . Performs ADL's independently . Performs IADL's independently . Calls provider office for new concerns or  questions  Please see past updates related to this goal by clicking on the "Past Updates" button in the selected goal        The patient verbalized understanding of instructions provided today and declined a print copy of patient instruction materials.   Telephone follow up appointment with care management team member scheduled for: 09/03/18  [name], RN, BSN, CCM Care Management Coordinator [place] [facility]  Direct Phone: [phone]

## 2018-08-11 NOTE — Chronic Care Management (AMB) (Signed)
  Chronic Care Management   Follow Up Note   08/10/2018 Name: Anna Clay MRN: [id number] DOB: 1950/09/10  Referred by: Glendale Chard, MD Reason for referral : Chronic Care Management (CCM RNCM Telephone Follow up )   Anna Clay is a 68 y.o. year old female who is a primary care patient of Glendale Chard, MD. The CCM team was consulted for assistance with chronic disease management and care coordination needs.    Review of patient status, including review of consultants reports, relevant laboratory and other test results, and collaboration with appropriate care team members and the patient's provider was performed as part of comprehensive patient evaluation and provision of chronic care management services.    I spoke with Anna Clay today by telephone to f/u on her referral for in home PT.   Goals Addressed      Patient Stated   . "I am unable to walk long distances due to having hip pain" (pt-stated)       Current Barriers:  . Impaired Physical Mobility secondary to hip pain  . High Risk for Falls, uses cane with ambulation  . Obesity   Nurse Case Manager Clinical Goal(s):  Marland Kitchen Over the next 60 days, patient will verbalize understanding of plan for in-home PT start date for evaluation and treatment of impaired physical mobility. 07/27/18 Goal date re-established for 60 days due to COVID treatment delays . Over the next 90 days, patient will verbalize adherence to following her HEP as directed by PT and will have improved physical mobility, strengthening and overall stamina.   CCM RN CM Interventions:  08/10/18: completed call with patient  . Evaluation of current treatment plan related to hip pain, impaired physical mobility and patient's adherence to plan as established by provider . Confirmed patient has completed her PT evaluation and recommendations for treatment have been placed through [facility] . Patient will receive in home PT twice weekly x 4 weeks to work on  strengthening and balance . Discussed the importance of adhering to her prescribed HEP; discussed patient was given a written plan of care including the recommended exercises in which she is adhering . Discussed patient's plan to consider Yoga in the future and to implement deep breathing in her daily exercise routine  . Discussed plans with patient for ongoing care management follow up and provided patient with direct contact information for care management team  Patient Self Care Activities:  . Self administers medications as prescribed . Attends all scheduled provider appointments . Calls pharmacy for medication refills . Attends church or other social activities . Performs ADL's independently . Performs IADL's independently . Calls provider office for new concerns or questions  Please see past updates related to this goal by clicking on the "Past Updates" button in the selected goal         Telephone follow up appointment with care management team member scheduled for: 09/03/18   [name], RN, BSN, CCM Care Management Coordinator [place] [facility]  Direct Phone: [phone]

## 2018-08-13 DIAGNOSIS — I272 Pulmonary hypertension, unspecified: Secondary | ICD-10-CM | POA: Diagnosis not present

## 2018-08-13 DIAGNOSIS — E1165 Type 2 diabetes mellitus with hyperglycemia: Secondary | ICD-10-CM | POA: Diagnosis not present

## 2018-08-13 DIAGNOSIS — E669 Obesity, unspecified: Secondary | ICD-10-CM | POA: Diagnosis not present

## 2018-08-13 DIAGNOSIS — I110 Hypertensive heart disease with heart failure: Secondary | ICD-10-CM | POA: Diagnosis not present

## 2018-08-13 DIAGNOSIS — M25552 Pain in left hip: Secondary | ICD-10-CM | POA: Diagnosis not present

## 2018-08-13 DIAGNOSIS — M25551 Pain in right hip: Secondary | ICD-10-CM | POA: Diagnosis not present

## 2018-08-17 DIAGNOSIS — E669 Obesity, unspecified: Secondary | ICD-10-CM | POA: Diagnosis not present

## 2018-08-17 DIAGNOSIS — M25552 Pain in left hip: Secondary | ICD-10-CM | POA: Diagnosis not present

## 2018-08-17 DIAGNOSIS — M25551 Pain in right hip: Secondary | ICD-10-CM | POA: Diagnosis not present

## 2018-08-17 DIAGNOSIS — I272 Pulmonary hypertension, unspecified: Secondary | ICD-10-CM | POA: Diagnosis not present

## 2018-08-17 DIAGNOSIS — E1165 Type 2 diabetes mellitus with hyperglycemia: Secondary | ICD-10-CM | POA: Diagnosis not present

## 2018-08-17 DIAGNOSIS — I110 Hypertensive heart disease with heart failure: Secondary | ICD-10-CM | POA: Diagnosis not present

## 2018-08-18 ENCOUNTER — Other Ambulatory Visit: Payer: Self-pay

## 2018-08-18 ENCOUNTER — Encounter: Payer: Self-pay | Admitting: Internal Medicine

## 2018-08-18 ENCOUNTER — Ambulatory Visit (INDEPENDENT_AMBULATORY_CARE_PROVIDER_SITE_OTHER): Payer: Medicare Other | Admitting: Internal Medicine

## 2018-08-18 VITALS — BP 122/78 | HR 87 | Temp 98.6°F | Ht 63.0 in | Wt 234.0 lb

## 2018-08-18 DIAGNOSIS — D573 Sickle-cell trait: Secondary | ICD-10-CM

## 2018-08-18 DIAGNOSIS — E1165 Type 2 diabetes mellitus with hyperglycemia: Secondary | ICD-10-CM | POA: Diagnosis not present

## 2018-08-18 DIAGNOSIS — Z23 Encounter for immunization: Secondary | ICD-10-CM | POA: Diagnosis not present

## 2018-08-18 DIAGNOSIS — I272 Pulmonary hypertension, unspecified: Secondary | ICD-10-CM | POA: Diagnosis not present

## 2018-08-18 DIAGNOSIS — M25571 Pain in right ankle and joints of right foot: Secondary | ICD-10-CM | POA: Diagnosis not present

## 2018-08-18 DIAGNOSIS — M25559 Pain in unspecified hip: Secondary | ICD-10-CM

## 2018-08-18 DIAGNOSIS — M109 Gout, unspecified: Secondary | ICD-10-CM | POA: Diagnosis not present

## 2018-08-18 MED ORDER — COLCHICINE 0.6 MG PO TABS: 0.6000 mg | ORAL_TABLET | Freq: Every day | ORAL | 2 refills | Status: DC

## 2018-08-18 MED ORDER — TETANUS-DIPHTH-ACELL PERTUSSIS 5-2.5-18.5 LF-MCG/0.5 IM SUSP: 0.5000 mL | Freq: Once | INTRAMUSCULAR | 0 refills | Status: AC

## 2018-08-18 NOTE — Patient Instructions (Signed)
Gout  Gout is painful swelling of your joints. Gout is a type of arthritis. It is caused by having too much uric acid in your body. Uric acid is a chemical that is made when your body breaks down substances called purines. If your body has too much uric acid, sharp crystals can form and build up in your joints. This causes pain and swelling. Gout attacks can happen quickly and be very painful (acute gout). Over time, the attacks can affect more joints and happen more often (chronic gout). What are the causes?  Too much uric acid in your blood. This can happen because: ? Your kidneys do not remove enough uric acid from your blood. ? Your body makes too much uric acid. ? You eat too many foods that are high in purines. These foods include organ meats, some seafood, and beer.  Trauma or stress. What increases the risk?  Having a family history of gout.  Being female and middle-aged.  Being female and having gone through menopause.  Being very overweight (obese).  Drinking alcohol, especially beer.  Not having enough water in the body (being dehydrated).  Losing weight too quickly.  Having an organ transplant.  Having lead poisoning.  Taking certain medicines.  Having kidney disease.  Having a skin condition called psoriasis. What are the signs or symptoms? An attack of acute gout usually happens in just one joint. The most common place is the big toe. Attacks often start at night. Other joints that may be affected include joints of the feet, ankle, knee, fingers, wrist, or elbow. Symptoms of an attack may include:  Very bad pain.  Warmth.  Swelling.  Stiffness.  Shiny, red, or purple skin.  Tenderness. The affected joint may be very painful to touch.  Chills and fever. Chronic gout may cause symptoms more often. More joints may be involved. You may also have white or yellow lumps (tophi) on your hands or feet or in other areas near your joints. How is this  treated?  Treatment for this condition has two phases: treating an acute attack and preventing future attacks.  Acute gout treatment may include: ? NSAIDs. ? Steroids. These are taken by mouth or injected into a joint. ? Colchicine. This medicine relieves pain and swelling. It can be given by mouth or through an IV tube.  Preventive treatment may include: ? Taking small doses of NSAIDs or colchicine daily. ? Using a medicine that reduces uric acid levels in your blood. ? Making changes to your diet. You may need to see a food expert (dietitian) about what to eat and drink to prevent gout. Follow these instructions at home: During a gout attack   If told, put ice on the painful area: ? Put ice in a plastic bag. ? Place a towel between your skin and the bag. ? Leave the ice on for 20 minutes, 2-3 times a day.  Raise (elevate) the painful joint above the level of your heart as often as you can.  Rest the joint as much as possible. If the joint is in your leg, you may be given crutches.  Follow instructions from your doctor about what you cannot eat or drink. Avoiding future gout attacks  Eat a low-purine diet. Avoid foods and drinks such as: ? Liver. ? Kidney. ? Anchovies. ? Asparagus. ? Herring. ? Mushrooms. ? Mussels. ? Beer.  Stay at a healthy weight. If you want to lose weight, talk with your doctor. Do not lose weight   too fast.  Start or continue an exercise plan as told by your doctor. Eating and drinking  Drink enough fluids to keep your pee (urine) pale yellow.  If you drink alcohol: ? Limit how much you use to:  0-1 drink a day for women.  0-2 drinks a day for men. ? Be aware of how much alcohol is in your drink. In the [place] one drink equals one 12 oz bottle of beer (355 mL), one 5 oz glass of wine (148 mL), or one 1 oz glass of hard liquor (44 mL). General instructions  Take over-the-counter and prescription medicines only as told by your doctor.  Do  not drive or use heavy machinery while taking prescription pain medicine.  Return to your normal activities as told by your doctor. Ask your doctor what activities are safe for you.  Keep all follow-up visits as told by your doctor. This is important. Contact a doctor if:  You have another gout attack.  You still have symptoms of a gout attack after 10 days of treatment.  You have problems (side effects) because of your medicines.  You have chills or a fever.  You have burning pain when you pee (urinate).  You have pain in your lower back or belly. Get help right away if:  You have very bad pain.  Your pain cannot be controlled.  You cannot pee. Summary  Gout is painful swelling of the joints.  The most common site of pain is the big toe, but it can affect other joints.  Medicines and avoiding some foods can help to prevent and treat gout attacks. This information is not intended to replace advice given to you by your health care provider. Make sure you discuss any questions you have with your health care provider. Document Released: 10/16/2007 Document Revised: 07/29/2017 Document Reviewed: 07/29/2017 Elsevier Patient Education  2020 Elsevier Inc.  

## 2018-08-18 NOTE — Progress Notes (Signed)
Subjective:     Patient ID: Anna Clay , female    DOB: 12/14/1950 , 68 y.o.   MRN: [id number]   Chief Complaint  Patient presents with  . Diabetes  . Gout  . Immunizations    Tdap    HPI  Diabetes She presents for her follow-up diabetic visit. She has type 2 diabetes mellitus. There are no hypoglycemic associated symptoms. There are no hypoglycemic complications. Diabetic complications include nephropathy. Risk factors for coronary artery disease include diabetes mellitus, dyslipidemia, hypertension, obesity, post-menopausal and sedentary lifestyle. She participates in exercise intermittently. An ACE inhibitor/angiotensin II receptor blocker is being taken.     Past Medical History:  Diagnosis Date  . Chest pain   . Diabetes mellitus with kidney disease ([place])   . Diastolic heart failure ([place])   . Gout   . Hypercholesteremia   . Hypertension   . Obesity   . OSA (obstructive sleep apnea)   . Oxygen deficiency      Family History  Problem Relation Age of Onset  . Heart disease Father   . Heart attack Father   . Stroke Father   . Heart disease Sister   . Cancer - Lung Sister   . Sickle cell trait Other        all family members  . Cancer Brother        bone  . Diabetes Brother   . Diabetes Sister   . Thyroid disease Mother   . Goiter Mother      Current Outpatient Medications:  .  acetaminophen (TYLENOL) 650 MG CR tablet, Take 1,300 mg by mouth every 8 (eight) hours as needed for pain., Disp: , Rfl:  .  allopurinol (ZYLOPRIM) 100 MG tablet, Take 1 tablet (100 mg total) by mouth daily., Disp: 90 tablet, Rfl: 2 .  amLODipine (NORVASC) 5 MG tablet, TAKE ONE TABLET BY MOUTH DAILY, Disp: 90 tablet, Rfl: 0 .  aspirin EC 81 MG tablet, Take 81 mg by mouth daily., Disp: , Rfl:  .  Cholecalciferol (VITAMIN D) 2000 UNITS CAPS, Take 1 capsule by mouth daily., Disp: , Rfl:  .  ferrous sulfate 325 (65 FE) MG EC tablet, Take 325 mg by mouth daily., Disp: , Rfl:  .  Flaxseed,  Linseed, (FLAXSEED OIL MAX STR) 1300 MG CAPS, Take 1 capsule by mouth daily., Disp: , Rfl:  .  folic acid (FOLVITE) 1 MG tablet, TAKE ONE TABLET BY MOUTH DAILY, Disp: 90 tablet, Rfl: 10 .  glucose blood (CONTOUR TEST) test strip, Use as instructed to check blood sugars 2 times per day dx: e11.65, Disp: 100 each, Rfl: 11 .  HUMALOG KWIKPEN 200 UNIT/ML SOPN, INJECT 6 UNITS BY SUBCUTANEOUS ROUTE  AT BREAKFAST, 8 UNITS AT LUNCH AND 10 UNITS AT DINNER, Disp: 6 pen, Rfl: 4 .  Insulin Glargine, 1 Unit Dial, (TOUJEO SOLOSTAR) 300 UNIT/ML SOPN, Inject 62 Units into the skin at bedtime., Disp: 18 pen, Rfl: 2 .  metoprolol succinate (TOPROL-XL) 25 MG 24 hr tablet, Take 1 tablet (25 mg total) by mouth daily., Disp: 90 tablet, Rfl: 0 .  Multiple Vitamin (MULTIVITAMIN WITH MINERALS) TABS tablet, Take 1 tablet by mouth daily., Disp: , Rfl:  .  nitroGLYCERIN (NITROSTAT) 0.4 MG SL tablet, Place 0.4 mg under the tongue every 5 (five) minutes as needed for chest pain., Disp: , Rfl:  .  NOVOFINE 32G X 6 MM MISC, Inject 1 applicator every morning into the skin., Disp: , Rfl:  .  Pitavastatin Calcium 4 MG TABS, Take 4 mg by mouth daily., Disp: , Rfl:  .  Potassium Gluconate 550 MG TABS, Take 1 tablet by mouth daily., Disp: , Rfl:  .  ranitidine (ZANTAC) 150 MG tablet, Take 150 mg by mouth daily as needed for heartburn., Disp: , Rfl:  .  spironolactone (ALDACTONE) 25 MG tablet, TAKE ONE TABLET BY MOUTH DAILY, Disp: 90 tablet, Rfl: 0 .  torsemide (DEMADEX) 20 MG tablet, TAKE ONE TABLET BY MOUTH DAILY, Disp: 90 tablet, Rfl: 0 .  colchicine 0.6 MG tablet, Take 1 tablet (0.6 mg total) by mouth daily., Disp: 30 tablet, Rfl: 2 .  Tdap (BOOSTRIX) 5-2.5-18.5 LF-MCG/0.5 injection, Inject 0.5 mLs into the muscle once for 1 dose., Disp: 0.5 mL, Rfl: 0   Allergies  Allergen Reactions  . Allspice [Pimenta] Hives and Shortness Of Breath  . Black Cohosh Hives and Shortness Of Breath  . Peach [Prunus Persica] Hives and Shortness Of  Breath    Fresh peaches  . Ivp Dye [Iodinated Diagnostic Agents] Other (See Comments)    Reaction unknown  . Tetracyclines & Related Other (See Comments)    Reaction unknown     Review of Systems  Constitutional: Negative.   Respiratory: Negative.   Cardiovascular: Negative.   Gastrointestinal: Negative.   Musculoskeletal: Positive for arthralgias.       She c/o R ankle pain. There is some pain with ambulation.   Neurological: Negative.   Psychiatric/Behavioral: Negative.      Today's Vitals   08/18/18 0932  BP: 122/78  Pulse: 87  Temp: 98.6 F (37 C)  TempSrc: Oral  Weight: 234 lb (106.1 kg)  Height: '5\' 3"'$  (1.6 m)  PainSc: 3   PainLoc: Foot   Body mass index is 41.45 kg/m.   Objective:  Physical Exam Vitals signs and nursing note reviewed.  Constitutional:      Appearance: Normal appearance.  HENT:     Head: Normocephalic and atraumatic.  Cardiovascular:     Rate and Rhythm: Normal rate and regular rhythm.     Heart sounds: Normal heart sounds.  Pulmonary:     Effort: Pulmonary effort is normal.     Breath sounds: Normal breath sounds.  Musculoskeletal:     Comments: R ankle tenderness to palpation  Skin:    General: Skin is warm.  Neurological:     General: No focal deficit present.     Mental Status: She is alert.  Psychiatric:        Mood and Affect: Mood normal.        Behavior: Behavior normal.         Assessment And Plan:     1. Uncontrolled type 2 diabetes mellitus with hyperglycemia ([place])  I will check labs as listed below. I will also refer her for diabetic eye exam.   - Ambulatory referral to Ophthalmology - BMP8+EGFR - Hemoglobin A1c  2. Gout, unspecified cause, unspecified chronicity, unspecified site  I will check uric acid level today.   - Uric acid  3. Pulmonary hypertension (HCC)  Chronic, yet stable. She is encouraged to keep regular f/u appts with Cardiology.   4. Acute right ankle pain  She has h/o gout. I will  check uric acid level today.   5. Sickle cell trait (HCC)  Chronic, yet stable.   6. Arthralgia of hip, unspecified laterality  Improving with home PT.   7. Need for vaccination  Rx Boostrix was sent to the pharmacy.   Bailey Mech  N Davone Shinault, MD    THE PATIENT IS ENCOURAGED TO PRACTICE SOCIAL DISTANCING DUE TO THE COVID-19 PANDEMIC.   

## 2018-08-19 LAB — HEMOGLOBIN A1C
Est. average glucose Bld gHb Est-mCnc: 183 mg/dL
Hgb A1c MFr Bld: 8 % — ABNORMAL HIGH (ref 4.8–5.6)

## 2018-08-19 LAB — BMP8+EGFR
BUN/Creatinine Ratio: 26 (ref 12–28)
BUN: 34 mg/dL — ABNORMAL HIGH (ref 8–27)
CO2: 24 mmol/L (ref 20–29)
Calcium: 10.4 mg/dL — ABNORMAL HIGH (ref 8.7–10.3)
Chloride: 97 mmol/L (ref 96–106)
Creatinine, Ser: 1.32 mg/dL — ABNORMAL HIGH (ref 0.57–1.00)
GFR calc Af Amer: 48 mL/min/{1.73_m2} — ABNORMAL LOW (ref >59)
GFR calc non Af Amer: 42 mL/min/{1.73_m2} — ABNORMAL LOW (ref >59)
Glucose: 120 mg/dL — ABNORMAL HIGH (ref 65–99)
Potassium: 4.5 mmol/L (ref 3.5–5.2)
Sodium: 138 mmol/L (ref 134–144)

## 2018-08-19 LAB — URIC ACID: Uric Acid: 8.6 mg/dL — ABNORMAL HIGH (ref 2.5–7.1)

## 2018-08-20 DIAGNOSIS — E1165 Type 2 diabetes mellitus with hyperglycemia: Secondary | ICD-10-CM | POA: Diagnosis not present

## 2018-08-20 DIAGNOSIS — E669 Obesity, unspecified: Secondary | ICD-10-CM | POA: Diagnosis not present

## 2018-08-20 DIAGNOSIS — I110 Hypertensive heart disease with heart failure: Secondary | ICD-10-CM | POA: Diagnosis not present

## 2018-08-20 DIAGNOSIS — M25552 Pain in left hip: Secondary | ICD-10-CM | POA: Diagnosis not present

## 2018-08-20 DIAGNOSIS — I272 Pulmonary hypertension, unspecified: Secondary | ICD-10-CM | POA: Diagnosis not present

## 2018-08-20 DIAGNOSIS — M25551 Pain in right hip: Secondary | ICD-10-CM | POA: Diagnosis not present

## 2018-08-20 LAB — SPECIMEN STATUS REPORT

## 2018-08-20 LAB — FRUCTOSAMINE: Fructosamine: 277 umol/L (ref 0–285)

## 2018-08-25 DIAGNOSIS — I110 Hypertensive heart disease with heart failure: Secondary | ICD-10-CM | POA: Diagnosis not present

## 2018-08-25 DIAGNOSIS — M25552 Pain in left hip: Secondary | ICD-10-CM | POA: Diagnosis not present

## 2018-08-25 DIAGNOSIS — E1165 Type 2 diabetes mellitus with hyperglycemia: Secondary | ICD-10-CM | POA: Diagnosis not present

## 2018-08-25 DIAGNOSIS — E669 Obesity, unspecified: Secondary | ICD-10-CM | POA: Diagnosis not present

## 2018-08-25 DIAGNOSIS — M25551 Pain in right hip: Secondary | ICD-10-CM | POA: Diagnosis not present

## 2018-08-25 DIAGNOSIS — I272 Pulmonary hypertension, unspecified: Secondary | ICD-10-CM | POA: Diagnosis not present

## 2018-08-27 DIAGNOSIS — M25552 Pain in left hip: Secondary | ICD-10-CM | POA: Diagnosis not present

## 2018-08-27 DIAGNOSIS — E1165 Type 2 diabetes mellitus with hyperglycemia: Secondary | ICD-10-CM | POA: Diagnosis not present

## 2018-08-27 DIAGNOSIS — I272 Pulmonary hypertension, unspecified: Secondary | ICD-10-CM | POA: Diagnosis not present

## 2018-08-27 DIAGNOSIS — E669 Obesity, unspecified: Secondary | ICD-10-CM | POA: Diagnosis not present

## 2018-08-27 DIAGNOSIS — I110 Hypertensive heart disease with heart failure: Secondary | ICD-10-CM | POA: Diagnosis not present

## 2018-08-27 DIAGNOSIS — M25551 Pain in right hip: Secondary | ICD-10-CM | POA: Diagnosis not present

## 2018-08-30 DIAGNOSIS — I110 Hypertensive heart disease with heart failure: Secondary | ICD-10-CM | POA: Diagnosis not present

## 2018-08-30 DIAGNOSIS — E669 Obesity, unspecified: Secondary | ICD-10-CM | POA: Diagnosis not present

## 2018-08-30 DIAGNOSIS — I272 Pulmonary hypertension, unspecified: Secondary | ICD-10-CM | POA: Diagnosis not present

## 2018-08-30 DIAGNOSIS — M25552 Pain in left hip: Secondary | ICD-10-CM | POA: Diagnosis not present

## 2018-08-30 DIAGNOSIS — M25551 Pain in right hip: Secondary | ICD-10-CM | POA: Diagnosis not present

## 2018-08-30 DIAGNOSIS — E1165 Type 2 diabetes mellitus with hyperglycemia: Secondary | ICD-10-CM | POA: Diagnosis not present

## 2018-09-02 DIAGNOSIS — M25551 Pain in right hip: Secondary | ICD-10-CM | POA: Diagnosis not present

## 2018-09-02 DIAGNOSIS — E1165 Type 2 diabetes mellitus with hyperglycemia: Secondary | ICD-10-CM | POA: Diagnosis not present

## 2018-09-02 DIAGNOSIS — I110 Hypertensive heart disease with heart failure: Secondary | ICD-10-CM | POA: Diagnosis not present

## 2018-09-02 DIAGNOSIS — M25552 Pain in left hip: Secondary | ICD-10-CM | POA: Diagnosis not present

## 2018-09-02 DIAGNOSIS — E669 Obesity, unspecified: Secondary | ICD-10-CM | POA: Diagnosis not present

## 2018-09-02 DIAGNOSIS — I272 Pulmonary hypertension, unspecified: Secondary | ICD-10-CM | POA: Diagnosis not present

## 2018-09-03 ENCOUNTER — Telehealth: Payer: Self-pay

## 2018-09-05 DIAGNOSIS — I110 Hypertensive heart disease with heart failure: Secondary | ICD-10-CM | POA: Diagnosis not present

## 2018-09-05 DIAGNOSIS — E1121 Type 2 diabetes mellitus with diabetic nephropathy: Secondary | ICD-10-CM | POA: Diagnosis not present

## 2018-09-05 DIAGNOSIS — L84 Corns and callosities: Secondary | ICD-10-CM | POA: Diagnosis not present

## 2018-09-05 DIAGNOSIS — Z9181 History of falling: Secondary | ICD-10-CM | POA: Diagnosis not present

## 2018-09-05 DIAGNOSIS — M25552 Pain in left hip: Secondary | ICD-10-CM | POA: Diagnosis not present

## 2018-09-05 DIAGNOSIS — E669 Obesity, unspecified: Secondary | ICD-10-CM | POA: Diagnosis not present

## 2018-09-05 DIAGNOSIS — E7800 Pure hypercholesterolemia, unspecified: Secondary | ICD-10-CM | POA: Diagnosis not present

## 2018-09-05 DIAGNOSIS — M25551 Pain in right hip: Secondary | ICD-10-CM | POA: Diagnosis not present

## 2018-09-05 DIAGNOSIS — G4733 Obstructive sleep apnea (adult) (pediatric): Secondary | ICD-10-CM | POA: Diagnosis not present

## 2018-09-05 DIAGNOSIS — Z87891 Personal history of nicotine dependence: Secondary | ICD-10-CM | POA: Diagnosis not present

## 2018-09-05 DIAGNOSIS — I5030 Unspecified diastolic (congestive) heart failure: Secondary | ICD-10-CM | POA: Diagnosis not present

## 2018-09-05 DIAGNOSIS — M109 Gout, unspecified: Secondary | ICD-10-CM | POA: Diagnosis not present

## 2018-09-05 DIAGNOSIS — J309 Allergic rhinitis, unspecified: Secondary | ICD-10-CM | POA: Diagnosis not present

## 2018-09-05 DIAGNOSIS — Z6841 Body Mass Index (BMI) 40.0 and over, adult: Secondary | ICD-10-CM | POA: Diagnosis not present

## 2018-09-05 DIAGNOSIS — I50811 Acute right heart failure: Secondary | ICD-10-CM | POA: Diagnosis not present

## 2018-09-05 DIAGNOSIS — E1165 Type 2 diabetes mellitus with hyperglycemia: Secondary | ICD-10-CM | POA: Diagnosis not present

## 2018-09-05 DIAGNOSIS — I272 Pulmonary hypertension, unspecified: Secondary | ICD-10-CM | POA: Diagnosis not present

## 2018-09-05 DIAGNOSIS — D509 Iron deficiency anemia, unspecified: Secondary | ICD-10-CM | POA: Diagnosis not present

## 2018-09-05 DIAGNOSIS — J9601 Acute respiratory failure with hypoxia: Secondary | ICD-10-CM | POA: Diagnosis not present

## 2018-09-05 DIAGNOSIS — Z794 Long term (current) use of insulin: Secondary | ICD-10-CM | POA: Diagnosis not present

## 2018-09-05 DIAGNOSIS — Z7982 Long term (current) use of aspirin: Secondary | ICD-10-CM | POA: Diagnosis not present

## 2018-09-06 DIAGNOSIS — M25551 Pain in right hip: Secondary | ICD-10-CM | POA: Diagnosis not present

## 2018-09-06 DIAGNOSIS — I272 Pulmonary hypertension, unspecified: Secondary | ICD-10-CM | POA: Diagnosis not present

## 2018-09-06 DIAGNOSIS — I110 Hypertensive heart disease with heart failure: Secondary | ICD-10-CM | POA: Diagnosis not present

## 2018-09-06 DIAGNOSIS — E669 Obesity, unspecified: Secondary | ICD-10-CM | POA: Diagnosis not present

## 2018-09-06 DIAGNOSIS — M25552 Pain in left hip: Secondary | ICD-10-CM | POA: Diagnosis not present

## 2018-09-06 DIAGNOSIS — E1165 Type 2 diabetes mellitus with hyperglycemia: Secondary | ICD-10-CM | POA: Diagnosis not present

## 2018-09-08 ENCOUNTER — Other Ambulatory Visit: Payer: Self-pay | Admitting: Internal Medicine

## 2018-09-09 ENCOUNTER — Other Ambulatory Visit: Payer: Self-pay

## 2018-09-09 MED ORDER — CONTOUR NEXT TEST VI STRP: ORAL_STRIP | 2 refills | Status: DC

## 2018-09-10 ENCOUNTER — Other Ambulatory Visit: Payer: Self-pay | Admitting: Internal Medicine

## 2018-09-14 DIAGNOSIS — E669 Obesity, unspecified: Secondary | ICD-10-CM | POA: Diagnosis not present

## 2018-09-14 DIAGNOSIS — I272 Pulmonary hypertension, unspecified: Secondary | ICD-10-CM | POA: Diagnosis not present

## 2018-09-14 DIAGNOSIS — E1165 Type 2 diabetes mellitus with hyperglycemia: Secondary | ICD-10-CM | POA: Diagnosis not present

## 2018-09-14 DIAGNOSIS — M25551 Pain in right hip: Secondary | ICD-10-CM | POA: Diagnosis not present

## 2018-09-14 DIAGNOSIS — I110 Hypertensive heart disease with heart failure: Secondary | ICD-10-CM | POA: Diagnosis not present

## 2018-09-14 DIAGNOSIS — M25552 Pain in left hip: Secondary | ICD-10-CM | POA: Diagnosis not present

## 2018-09-17 ENCOUNTER — Telehealth: Payer: Self-pay

## 2018-09-20 DIAGNOSIS — I272 Pulmonary hypertension, unspecified: Secondary | ICD-10-CM | POA: Diagnosis not present

## 2018-09-20 DIAGNOSIS — I110 Hypertensive heart disease with heart failure: Secondary | ICD-10-CM | POA: Diagnosis not present

## 2018-09-20 DIAGNOSIS — M25551 Pain in right hip: Secondary | ICD-10-CM | POA: Diagnosis not present

## 2018-09-20 DIAGNOSIS — E669 Obesity, unspecified: Secondary | ICD-10-CM | POA: Diagnosis not present

## 2018-09-20 DIAGNOSIS — M25552 Pain in left hip: Secondary | ICD-10-CM | POA: Diagnosis not present

## 2018-09-20 DIAGNOSIS — E1165 Type 2 diabetes mellitus with hyperglycemia: Secondary | ICD-10-CM | POA: Diagnosis not present

## 2018-09-21 DIAGNOSIS — Z1231 Encounter for screening mammogram for malignant neoplasm of breast: Secondary | ICD-10-CM | POA: Diagnosis not present

## 2018-09-21 LAB — HM MAMMOGRAPHY

## 2018-09-29 ENCOUNTER — Other Ambulatory Visit: Payer: Self-pay | Admitting: Internal Medicine

## 2018-09-30 ENCOUNTER — Encounter: Payer: Self-pay | Admitting: Internal Medicine

## 2018-09-30 DIAGNOSIS — M25552 Pain in left hip: Secondary | ICD-10-CM | POA: Diagnosis not present

## 2018-09-30 DIAGNOSIS — I272 Pulmonary hypertension, unspecified: Secondary | ICD-10-CM | POA: Diagnosis not present

## 2018-09-30 DIAGNOSIS — I110 Hypertensive heart disease with heart failure: Secondary | ICD-10-CM | POA: Diagnosis not present

## 2018-09-30 DIAGNOSIS — E1165 Type 2 diabetes mellitus with hyperglycemia: Secondary | ICD-10-CM | POA: Diagnosis not present

## 2018-09-30 DIAGNOSIS — M25551 Pain in right hip: Secondary | ICD-10-CM | POA: Diagnosis not present

## 2018-09-30 DIAGNOSIS — E669 Obesity, unspecified: Secondary | ICD-10-CM | POA: Diagnosis not present

## 2018-10-01 ENCOUNTER — Ambulatory Visit: Payer: Self-pay

## 2018-10-01 DIAGNOSIS — I272 Pulmonary hypertension, unspecified: Secondary | ICD-10-CM

## 2018-10-01 DIAGNOSIS — G4733 Obstructive sleep apnea (adult) (pediatric): Secondary | ICD-10-CM

## 2018-10-01 DIAGNOSIS — E1165 Type 2 diabetes mellitus with hyperglycemia: Secondary | ICD-10-CM

## 2018-10-01 NOTE — Chronic Care Management (AMB) (Signed)
  Chronic Care Management   Outreach Note  10/01/2018 Name: Anna Clay MRN: [id number] DOB: [birth year]  Referred by: Glendale Chard, MD Reason for referral : Chronic Care Management (CCM RNCM Telephone Follow up )   An unsuccessful telephone outreach was attempted today. The patient was referred to the case management team by Glendale Chard MD for assistance with chronic care management and care coordination.   Follow Up Plan: Telephone follow up appointment with care management team member scheduled for: 10/08/18  [name], RN, BSN, CCM Care Management Coordinator [place] [facility]  Direct Phone: [phone]

## 2018-10-07 ENCOUNTER — Encounter: Payer: Self-pay | Admitting: Internal Medicine

## 2018-10-08 ENCOUNTER — Telehealth: Payer: Self-pay

## 2018-10-12 DIAGNOSIS — N6002 Solitary cyst of left breast: Secondary | ICD-10-CM | POA: Diagnosis not present

## 2018-10-12 DIAGNOSIS — R928 Other abnormal and inconclusive findings on diagnostic imaging of breast: Secondary | ICD-10-CM | POA: Diagnosis not present

## 2018-10-12 DIAGNOSIS — N6320 Unspecified lump in the left breast, unspecified quadrant: Secondary | ICD-10-CM | POA: Diagnosis not present

## 2018-10-12 LAB — HM MAMMOGRAPHY

## 2018-10-20 ENCOUNTER — Other Ambulatory Visit: Payer: Self-pay | Admitting: Internal Medicine

## 2018-10-20 ENCOUNTER — Other Ambulatory Visit: Payer: Self-pay | Admitting: Cardiology

## 2018-11-01 ENCOUNTER — Ambulatory Visit: Payer: Medicare Other

## 2018-11-04 ENCOUNTER — Other Ambulatory Visit: Payer: Self-pay | Admitting: Internal Medicine

## 2018-11-06 DIAGNOSIS — Z23 Encounter for immunization: Secondary | ICD-10-CM | POA: Diagnosis not present

## 2018-11-11 ENCOUNTER — Telehealth: Payer: Self-pay

## 2018-11-11 ENCOUNTER — Other Ambulatory Visit: Payer: Self-pay | Admitting: Internal Medicine

## 2018-11-27 ENCOUNTER — Telehealth: Payer: Self-pay

## 2018-11-27 NOTE — Telephone Encounter (Signed)
Left vm for pt to return call to reschedule appt

## 2018-11-29 ENCOUNTER — Ambulatory Visit: Payer: Medicare Other | Admitting: Internal Medicine

## 2018-12-20 ENCOUNTER — Ambulatory Visit: Payer: Self-pay

## 2018-12-20 ENCOUNTER — Telehealth: Payer: Self-pay

## 2018-12-20 DIAGNOSIS — I272 Pulmonary hypertension, unspecified: Secondary | ICD-10-CM

## 2018-12-20 DIAGNOSIS — G4733 Obstructive sleep apnea (adult) (pediatric): Secondary | ICD-10-CM

## 2018-12-20 DIAGNOSIS — E1165 Type 2 diabetes mellitus with hyperglycemia: Secondary | ICD-10-CM

## 2018-12-22 NOTE — Chronic Care Management (AMB) (Signed)
Chronic Care Management   Follow Up Note   12/21/2018 Name: Anna Clay MRN: [id number] DOB: 1950/08/12  Referred by: Glendale Chard, MD Reason for referral : Chronic Care Management (CCM RNCM Telephone Outreach )   Anna Clay is a 68 y.o. year old female who is a primary care patient of Glendale Chard, MD. The CCM team was consulted for assistance with chronic disease management and care coordination needs.    Review of patient status, including review of consultants reports, relevant laboratory and other test results, and collaboration with appropriate care team members and the patient's provider was performed as part of comprehensive patient evaluation and provision of chronic care management services.    SDOH (Social Determinants of Health) screening performed today: None. See Care Plan for related entries.   Placed outbound call to patient for a CCM RN follow up.    Outpatient Encounter Medications as of 12/20/2018  Medication Sig  . HUMALOG KWIKPEN 200 UNIT/ML SOPN INJECT 6 UNITS BY SUBCUTANEOUS ROUTE  AT BREAKFAST, 8 UNITS AT LUNCH AND 10 UNITS AT DINNER  . TOUJEO SOLOSTAR 300 UNIT/ML SOPN INJECT 62 UNITS SUBCUTANEOUSLY EVERY NIGHT AT BEDTIME  . acetaminophen (TYLENOL) 650 MG CR tablet Take 1,300 mg by mouth every 8 (eight) hours as needed for pain.  Marland Kitchen allopurinol (ZYLOPRIM) 100 MG tablet Take 1 tablet (100 mg total) by mouth daily.  Marland Kitchen amLODipine (NORVASC) 5 MG tablet TAKE ONE TABLET BY MOUTH DAILY  . aspirin EC 81 MG tablet Take 81 mg by mouth daily.  . Cholecalciferol (VITAMIN D) 2000 UNITS CAPS Take 1 capsule by mouth daily.  . colchicine 0.6 MG tablet Take 1 tablet (0.6 mg total) by mouth daily.  . ferrous sulfate 325 (65 FE) MG EC tablet Take 325 mg by mouth daily.  . Flaxseed, Linseed, (FLAXSEED OIL MAX STR) 1300 MG CAPS Take 1 capsule by mouth daily.  . folic acid (FOLVITE) 1 MG tablet TAKE ONE TABLET BY MOUTH DAILY  . glucose blood (CONTOUR NEXT TEST) test strip Use  as instructed to check blood sugars 2 times per day dx: e11.22  . metoprolol succinate (TOPROL-XL) 25 MG 24 hr tablet TAKE ONE TABLET BY MOUTH DAILY  . Multiple Vitamin (MULTIVITAMIN WITH MINERALS) TABS tablet Take 1 tablet by mouth daily.  . nitroGLYCERIN (NITROSTAT) 0.4 MG SL tablet Place 0.4 mg under the tongue every 5 (five) minutes as needed for chest pain.  Marland Kitchen NOVOFINE 32G X 6 MM MISC USE 5 TO 8 NEEDLES PER DAY AS DIRECTED PER INSULIN PROTOCOL  . Pitavastatin Calcium 4 MG TABS Take 4 mg by mouth daily.  . Potassium Gluconate 550 MG TABS Take 1 tablet by mouth daily.  . ranitidine (ZANTAC) 150 MG tablet Take 150 mg by mouth daily as needed for heartburn.  . spironolactone (ALDACTONE) 25 MG tablet TAKE ONE TABLET BY MOUTH DAILY  . torsemide (DEMADEX) 20 MG tablet TAKE ONE TABLET BY MOUTH DAILY   No facility-administered encounter medications on file as of 12/20/2018.      Goals Addressed      Patient Stated   . "I am having some low blood sugars...as low as in the 50's" (pt-stated)       Current Barriers:  . Chronic Disease Management support and education needs related to Diabetes  Nurse Case Manager Clinical Goal(s):  Marland Kitchen Over the next 90 days, patient will work with the CCM team to address needs related to Diabetes disease management   CCM RN CM  Interventions:  12/21/18 call completed with patient  . Evaluation of current treatment plan related to Diabetes and patient's adherence to plan as established by provider. . Advised patient to eat a healthy snack at bedtime, such as 1/2 plain peanut butter sandwich to help avoid hypoglycemic event during nighttime hours . Provided education to patient re: increase in A1C from 7.3 to 8.0 obtained on 08/18/18; Reviewed meal planning and discussed typical meals for patient; education provided of importance of implementing daily exercise routine, 150 minutes per week, minimal is recommended by ADA - pt is following a daily HEP currently, discussed  this will lower CBG's . Reviewed medications with patient and discussed patient is taking Toujeo 62 units at bedtime as directed and Humalog insulin SS but she has not needed . Collaborated with embedded Pharm D Lottie Dawson regarding outreach needed to patient to review Toujeo regimen due to having multiple nighttime hypoglycemic events with CBG in the 50's . Discussed plans with patient for ongoing care management follow up and provided patient with direct contact information for care management team . Provided patient with printed  educational materials related to Diabetes Zone Safety Tool, Diabetes Meal Planning, Carb Choices, Carb Counting, s/s hypo/hyperglycemia, Know Your A1C . Advised patient, providing education and rationale, to check cbg daily before meals and record, calling the CCM team and or PCP for findings outside established parameters.   . Discussed goal daily glycemic control is 80-130 . Rescheduled DM follow up with Dr. Baird Cancer for next afternoon available per patient's request, 12/29/18 @4 :00 PM   Patient Self Care Activities:  . Self administers medications as prescribed . Attends all scheduled provider appointments . Calls pharmacy for medication refills . Performs ADL's independently . Performs IADL's independently . Calls provider office for new concerns or questions   Initial goal documentation     . "I am having some shoulder pain" (pt-stated)       Current Barriers:  Marland Kitchen Knowledge Deficits related to diagnosis and treatment of left shoulder pain   Nurse Case Manager Clinical Goal(s):  Marland Kitchen Over the next 30 days, patient will work with PCP to address needs related to left shoulder pain and decreased ROM   CCM RN CM Interventions:  12/21/18 call completed with patient  . Evaluation of current treatment plan related to left shoulder pain and patient's adherence to plan as established by provider. . Advised patient to notify Dr. Baird Cancer of her left shoulder pain and  decreased ROM . Discussed plans with patient for ongoing care management follow up and provided patient with direct contact information for care management team . Reviewed scheduled/upcoming provider appointments including: DM follow visit scheduled with Dr. Baird Cancer for 12/29/18 @4  PM   Patient Self Care Activities:  . Self administers medications as prescribed . Attends all scheduled provider appointments . Calls pharmacy for medication refills . Performs ADL's independently . Performs IADL's independently . Calls provider office for new concerns or questions  Initial goal documentation     . COMPLETED: "I am unable to walk long distances due to having hip pain" (pt-stated)       Current Barriers:  . Impaired Physical Mobility secondary to hip pain  . High Risk for Falls, uses cane with ambulation  . Obesity   Nurse Case Manager Clinical Goal(s):  Marland Kitchen Over the next 60 days, patient will verbalize understanding of plan for in-home PT start date for evaluation and treatment of impaired physical mobility. 07/27/18 Goal date re-established for 60 days due  to COVID treatment delays Goal Met . Over the next 90 days, patient will verbalize adherence to following her HEP as directed by PT and will have improved physical mobility, strengthening and overall stamina. Goal Met  CCM RN CM Interventions:  12/21/18: completed call with patient  . Evaluation of current treatment plan related to hip pain, impaired physical mobility and patient's adherence to plan as established by provider . Discussed patient found in home PT to be very effective; discussed patient is following a HEP that consists of daily stretching and performance of exercises taught by PT . Discussed patient has improved ROM and stamina following PT . Positive reinforcement provided, discussed patient will notify PCP or new/worsening hip pain, change in balance, gait or falls   Patient Self Care Activities:  . Self administers  medications as prescribed . Attends all scheduled provider appointments . Calls pharmacy for medication refills . Attends church or other social activities . Performs ADL's independently . Performs IADL's independently . Calls provider office for new concerns or questions  Please see past updates related to this goal by clicking on the "Past Updates" button in the selected goal           Telephone follow up appointment with care management team member scheduled for: 01/05/19   [name], RN, BSN, CCM Care Management Coordinator [facility]  Direct Phone: [phone]

## 2018-12-22 NOTE — Patient Instructions (Signed)
Visit Information  Goals Addressed      Patient Stated   . "I am having some low blood sugars...as low as in the 50's" (pt-stated)       Current Barriers:  . Chronic Disease Management support and education needs related to Diabetes  Nurse Case Manager Clinical Goal(s):  Marland Kitchen Over the next 90 days, patient will work with the CCM team to address needs related to Diabetes disease management   CCM RN CM Interventions:  12/21/18 call completed with patient  . Evaluation of current treatment plan related to Diabetes and patient's adherence to plan as established by provider. . Advised patient to eat a healthy snack at bedtime, such as 1/2 plain peanut butter sandwich to help avoid hypoglycemic event during nighttime hours . Provided education to patient re: increase in A1C from 7.3 to 8.0 obtained on 08/18/18; Reviewed meal planning and discussed typical meals for patient; education provided of importance of implementing daily exercise routine, 150 minutes per week, minimal is recommended by ADA - pt is following a daily HEP currently, discussed this will lower CBG's . Reviewed medications with patient and discussed patient is taking Toujeo 62 units at bedtime as directed and Humalog insulin SS but she has not needed . Collaborated with embedded Pharm D Lottie Dawson regarding outreach needed to patient to review Toujeo regimen due to having multiple nighttime hypoglycemic events with CBG in the 50's . Discussed plans with patient for ongoing care management follow up and provided patient with direct contact information for care management team . Provided patient with printed  educational materials related to Diabetes Zone Safety Tool, Diabetes Meal Planning, Carb Choices, Carb Counting, s/s hypo/hyperglycemia, Know Your A1C . Advised patient, providing education and rationale, to check cbg daily before meals and record, calling the CCM team and or PCP for findings outside established parameters.    . Discussed goal daily glycemic control is 80-130 . Rescheduled DM follow up with Dr. Baird Cancer for next afternoon available per patient's request, 12/29/18 @4 :00 PM   Patient Self Care Activities:  . Self administers medications as prescribed . Attends all scheduled provider appointments . Calls pharmacy for medication refills . Performs ADL's independently . Performs IADL's independently . Calls provider office for new concerns or questions   Initial goal documentation     . "I am having some shoulder pain" (pt-stated)       Current Barriers:  Marland Kitchen Knowledge Deficits related to diagnosis and treatment of left shoulder pain   Nurse Case Manager Clinical Goal(s):  Marland Kitchen Over the next 30 days, patient will work with PCP to address needs related to left shoulder pain and decreased ROM   CCM RN CM Interventions:  12/21/18 call completed with patient  . Evaluation of current treatment plan related to left shoulder pain and patient's adherence to plan as established by provider. . Advised patient to notify Dr. Baird Cancer of her left shoulder pain and decreased ROM . Discussed plans with patient for ongoing care management follow up and provided patient with direct contact information for care management team . Reviewed scheduled/upcoming provider appointments including: DM follow visit scheduled with Dr. Baird Cancer for 12/29/18 @4  PM   Patient Self Care Activities:  . Self administers medications as prescribed . Attends all scheduled provider appointments . Calls pharmacy for medication refills . Performs ADL's independently . Performs IADL's independently . Calls provider office for new concerns or questions  Initial goal documentation     . COMPLETED: "I am unable to  walk long distances due to having hip pain" (pt-stated)       Current Barriers:  . Impaired Physical Mobility secondary to hip pain  . High Risk for Falls, uses cane with ambulation  . Obesity   Nurse Case Manager Clinical  Goal(s):  Marland Kitchen Over the next 60 days, patient will verbalize understanding of plan for in-home PT start date for evaluation and treatment of impaired physical mobility. 07/27/18 Goal date re-established for 60 days due to COVID treatment delays Goal Met . Over the next 90 days, patient will verbalize adherence to following her HEP as directed by PT and will have improved physical mobility, strengthening and overall stamina. Goal Met  CCM RN CM Interventions:  12/21/18: completed call with patient  . Evaluation of current treatment plan related to hip pain, impaired physical mobility and patient's adherence to plan as established by provider . Discussed patient found in home PT to be very effective; discussed patient is following a HEP that consists of daily stretching and performance of exercises taught by PT . Discussed patient has improved ROM and stamina following PT . Positive reinforcement provided, discussed patient will notify PCP or new/worsening hip pain, change in balance, gait or falls   Patient Self Care Activities:  . Self administers medications as prescribed . Attends all scheduled provider appointments . Calls pharmacy for medication refills . Attends church or other social activities . Performs ADL's independently . Performs IADL's independently . Calls provider office for new concerns or questions  Please see past updates related to this goal by clicking on the "Past Updates" button in the selected goal          The patient verbalized understanding of instructions provided today and declined a print copy of patient instruction materials.   Telephone follow up appointment with care management team member scheduled for: 01/05/19  [name], RN, BSN, CCM Care Management Coordinator [facility]  Direct Phone: [phone]

## 2018-12-23 ENCOUNTER — Ambulatory Visit: Payer: Self-pay | Admitting: Pharmacist

## 2018-12-23 DIAGNOSIS — E1165 Type 2 diabetes mellitus with hyperglycemia: Secondary | ICD-10-CM

## 2018-12-23 NOTE — Progress Notes (Signed)
  Chronic Care Management   Outreach Note  12/23/2018 Name: Anna Clay MRN: [id number] DOB: 1950/02/03  Referred by: Glendale Chard, MD Reason for referral : Chronic Care Management   An unsuccessful telephone outreach was attempted today. The patient was referred to the case management team by for assistance with care management and care coordination. Full chart review performed in anticipation of patient care.  Drug-drug interaction report ran for completeness.  Patient reports hypoglycemia per CCM RM CM.  Will follow up to adjust accordingly.  Follow Up Plan: A HIPPA compliant phone message was left for the patient providing contact information and requesting a return call.  The care management team will reach out to the patient again over the next 7-10 days.   SIGNATURE Regina Eck, PharmD, BCPS Clinical Pharmacist, [facility] [place]: [phone]

## 2018-12-24 ENCOUNTER — Ambulatory Visit (INDEPENDENT_AMBULATORY_CARE_PROVIDER_SITE_OTHER): Payer: Medicare Other | Admitting: Pharmacist

## 2018-12-24 DIAGNOSIS — E1165 Type 2 diabetes mellitus with hyperglycemia: Secondary | ICD-10-CM

## 2018-12-27 ENCOUNTER — Telehealth: Payer: Self-pay

## 2018-12-27 NOTE — Patient Instructions (Signed)
Visit Information  Goals Addressed            This Visit's Progress     Patient Stated   . I would like to manage my diabetes (pt-stated)       Current Barriers:  . Diabetes: T5VV; complicated by chronic medical conditions including HTN, HLD, most recent A1c 8% ON 08/18/18 . Current antihyperglycemic regimen: Toujeo 62 units qHS, Humalog meal time 6 units breakfast, 8 units lunch, 10 units with dinner o Follow up to adjust regimen based on patient's BG logs/PCP visit on 12/29/18 . Reports hypoglycemic symptoms, including dizziness, lightheadedness, shaking, sweating (3 weeks ago) . Denies hyperglycemic symptoms . Current meal patterns: reports eating better, reducing carbohydrate intake.  Her son is helping to prepare meals, etc . Current exercise: n/a . Current blood glucose readings: FBG<100, most recently 38 o Denies hypoglycemia in the past 3 weeks (BG<70 reported 3 weeks ago) o May decrease basal insulin pending patient's BG logs o Repeat a1C/LABS on 12/29/18 . Cardiovascular risk reduction: o Current hypertensive regimen: amlodipine, metoprolol. spironolactone o Current hyperlipidemia regimen: pitivastatin o Current antiplatelet regimen: n/a  Pharmacist Clinical Goal(s):  Marland Kitchen Over the next 90 days, patient will work with PharmD and primary care provider to address needs related to optimization of medication management of chronic conditions  Interventions: . Comprehensive medication review performed, medication list updated in electronic medical record . Reviewed & discussed the following diabetes-related information with patient: o Follow ADA recommended "diabetes-friendly" diet  (reviewed healthy snack/food options) o Discussed insulin injection technique o Reviewed medication purpose/side effects-->counseled on signs/symptoms of hypoglycemia  Patient Self Care Activities:  . Patient will check blood glucose 3-4 times daily as directed , document, and provide at future  appointments . Patient will focus on medication adherence by continuing to take medications as prescribed . Patient will take medications as prescribed . Patient will contact provider with any episodes of hypoglycemia . Patient will report any questions or concerns to provider   Initial goal documentation        The patient verbalized understanding of instructions provided today and declined a print copy of patient instruction materials.   The care management team will reach out to the patient again over the next 7 days.   SIGNATURE Regina Eck, PharmD, BCPS Clinical Pharmacist, [facility] [place]: [phone]

## 2018-12-27 NOTE — Progress Notes (Signed)
Chronic Care Management   Initial Visit Note  12/24/2018 Name: Anna Clay MRN: [id number] DOB: [birth year]  Referred by: Glendale Chard, MD Reason for referral : Chronic care managemetn   Anna Clay is a 68 y.o. year old female who is a primary care patient of Glendale Chard, MD. The CCM team was consulted for assistance with chronic disease management and care coordination needs related to DMII  Review of patient status, including review of consultants reports, relevant laboratory and other test results, and collaboration with appropriate care team members and the patient's provider was performed as part of comprehensive patient evaluation and provision of chronic care management services.    I spoke with Anna Clay by telephone today.  Medications: Outpatient Encounter Medications as of 12/24/2018  Medication Sig  . acetaminophen (TYLENOL) 650 MG CR tablet Take 1,300 mg by mouth every 8 (eight) hours as needed for pain.  Marland Kitchen allopurinol (ZYLOPRIM) 100 MG tablet Take 1 tablet (100 mg total) by mouth daily.  Marland Kitchen amLODipine (NORVASC) 5 MG tablet TAKE ONE TABLET BY MOUTH DAILY  . aspirin EC 81 MG tablet Take 81 mg by mouth daily.  . Cholecalciferol (VITAMIN D) 2000 UNITS CAPS Take 1 capsule by mouth daily.  . colchicine 0.6 MG tablet Take 1 tablet (0.6 mg total) by mouth daily.  . ferrous sulfate 325 (65 FE) MG EC tablet Take 325 mg by mouth daily.  . Flaxseed, Linseed, (FLAXSEED OIL MAX STR) 1300 MG CAPS Take 1 capsule by mouth daily.  . folic acid (FOLVITE) 1 MG tablet TAKE ONE TABLET BY MOUTH DAILY  . glucose blood (CONTOUR NEXT TEST) test strip Use as instructed to check blood sugars 2 times per day dx: e11.22  . HUMALOG KWIKPEN 200 UNIT/ML SOPN INJECT 6 UNITS BY SUBCUTANEOUS ROUTE  AT BREAKFAST, 8 UNITS AT LUNCH AND 10 UNITS AT DINNER  . metoprolol succinate (TOPROL-XL) 25 MG 24 hr tablet TAKE ONE TABLET BY MOUTH DAILY  . Multiple Vitamin (MULTIVITAMIN WITH MINERALS) TABS tablet Take  1 tablet by mouth daily.  . nitroGLYCERIN (NITROSTAT) 0.4 MG SL tablet Place 0.4 mg under the tongue every 5 (five) minutes as needed for chest pain.  Marland Kitchen NOVOFINE 32G X 6 MM MISC USE 5 TO 8 NEEDLES PER DAY AS DIRECTED PER INSULIN PROTOCOL  . Pitavastatin Calcium 4 MG TABS Take 4 mg by mouth daily.  . Potassium Gluconate 550 MG TABS Take 1 tablet by mouth daily.  . ranitidine (ZANTAC) 150 MG tablet Take 150 mg by mouth daily as needed for heartburn.  . spironolactone (ALDACTONE) 25 MG tablet TAKE ONE TABLET BY MOUTH DAILY  . torsemide (DEMADEX) 20 MG tablet TAKE ONE TABLET BY MOUTH DAILY  . TOUJEO SOLOSTAR 300 UNIT/ML SOPN INJECT 62 UNITS SUBCUTANEOUSLY EVERY NIGHT AT BEDTIME   No facility-administered encounter medications on file as of 12/24/2018.      Objective:   Goals Addressed            This Visit's Progress     Patient Stated   . I would like to manage my diabetes (pt-stated)       Current Barriers:  . Diabetes: K2HC; complicated by chronic medical conditions including HTN, HLD, most recent A1c 8% ON 08/18/18 . Current antihyperglycemic regimen: Toujeo 62 units qHS, Humalog meal time 6 units breakfast, 8 units lunch, 10 units with dinner o Follow up to adjust regimen based on patient's BG logs/PCP visit on 12/29/18 . Reports hypoglycemic symptoms, including dizziness, lightheadedness,  shaking, sweating (3 weeks ago) . Denies hyperglycemic symptoms . Current meal patterns: reports eating better, reducing carbohydrate intake.  Her son is helping to prepare meals, etc . Current exercise: n/a . Current blood glucose readings: FBG<100, most recently 49 o Denies hypoglycemia in the past 3 weeks (BG<70 reported 3 weeks ago) o May decrease basal insulin pending patient's BG logs o Repeat a1C/LABS on 12/29/18 . Cardiovascular risk reduction: o Current hypertensive regimen: amlodipine, metoprolol. spironolactone o Current hyperlipidemia regimen: pitivastatin o Current antiplatelet  regimen: n/a  Pharmacist Clinical Goal(s):  Marland Kitchen Over the next 90 days, patient will work with PharmD and primary care provider to address needs related to optimization of medication management of chronic conditions  Interventions: . Comprehensive medication review performed, medication list updated in electronic medical record . Reviewed & discussed the following diabetes-related information with patient: o Follow ADA recommended "diabetes-friendly" diet  (reviewed healthy snack/food options) o Discussed insulin injection technique o Reviewed medication purpose/side effects-->counseled on signs/symptoms of hypoglycemia  Patient Self Care Activities:  . Patient will check blood glucose 3-4 times daily as directed , document, and provide at future appointments . Patient will focus on medication adherence by continuing to take medications as prescribed . Patient will take medications as prescribed . Patient will contact provider with any episodes of hypoglycemia . Patient will report any questions or concerns to provider   Initial goal documentation         Plan:   The care management team will reach out to the patient again over the next 7 days.     Provider Signature Regina Eck, PharmD, BCPS Clinical Pharmacist, [facility] [place]: [phone]

## 2018-12-29 ENCOUNTER — Other Ambulatory Visit: Payer: Self-pay

## 2018-12-29 ENCOUNTER — Telehealth: Payer: Medicare Other | Admitting: Pharmacist

## 2018-12-29 ENCOUNTER — Ambulatory Visit (INDEPENDENT_AMBULATORY_CARE_PROVIDER_SITE_OTHER): Payer: Medicare Other | Admitting: Internal Medicine

## 2018-12-29 ENCOUNTER — Encounter: Payer: Self-pay | Admitting: Internal Medicine

## 2018-12-29 VITALS — BP 132/84 | HR 103 | Temp 98.4°F | Ht 63.0 in | Wt 243.2 lb

## 2018-12-29 DIAGNOSIS — M109 Gout, unspecified: Secondary | ICD-10-CM

## 2018-12-29 DIAGNOSIS — N183 Chronic kidney disease, stage 3 unspecified: Secondary | ICD-10-CM | POA: Diagnosis not present

## 2018-12-29 DIAGNOSIS — I272 Pulmonary hypertension, unspecified: Secondary | ICD-10-CM | POA: Diagnosis not present

## 2018-12-29 DIAGNOSIS — Z794 Long term (current) use of insulin: Secondary | ICD-10-CM | POA: Diagnosis not present

## 2018-12-29 DIAGNOSIS — M1612 Unilateral primary osteoarthritis, left hip: Secondary | ICD-10-CM

## 2018-12-29 DIAGNOSIS — E1122 Type 2 diabetes mellitus with diabetic chronic kidney disease: Secondary | ICD-10-CM

## 2018-12-29 DIAGNOSIS — Z6841 Body Mass Index (BMI) 40.0 and over, adult: Secondary | ICD-10-CM

## 2018-12-29 DIAGNOSIS — E1165 Type 2 diabetes mellitus with hyperglycemia: Secondary | ICD-10-CM | POA: Diagnosis not present

## 2018-12-29 DIAGNOSIS — M25512 Pain in left shoulder: Secondary | ICD-10-CM | POA: Diagnosis not present

## 2018-12-29 MED ORDER — ACCU-CHEK MULTICLIX LANCETS MISC: 3 refills | Status: DC

## 2018-12-29 NOTE — Progress Notes (Signed)
This visit occurred during the SARS-CoV-2 public health emergency.  Safety protocols were in place, including screening questions prior to the visit, additional usage of staff PPE, and extensive cleaning of exam room while observing appropriate contact time as indicated for disinfecting solutions.  Subjective:     Patient ID: Anna Clay , female    DOB: 07-Feb-1950 , 68 y.o.   MRN: [id number]   Chief Complaint  Patient presents with  . Diabetes  . Hypertension    HPI  Diabetes She presents for her follow-up diabetic visit. She has type 2 diabetes mellitus. There are no hypoglycemic associated symptoms. Pertinent negatives for diabetes include no blurred vision and no chest pain. There are no hypoglycemic complications. Diabetic complications include nephropathy. Risk factors for coronary artery disease include diabetes mellitus, dyslipidemia, hypertension, obesity, post-menopausal and sedentary lifestyle. She participates in exercise intermittently. An ACE inhibitor/angiotensin II receptor blocker is being taken.  Hypertension This is a chronic problem. The current episode started more than 1 year ago. The problem has been gradually improving since onset. The problem is controlled. Pertinent negatives include no blurred vision, chest pain, palpitations or shortness of breath. Risk factors for coronary artery disease include diabetes mellitus, dyslipidemia, post-menopausal state, sedentary lifestyle and obesity. The current treatment provides moderate improvement. Compliance problems include exercise.  Hypertensive end-organ damage includes kidney disease.     Past Medical History:  Diagnosis Date  . Chest pain   . Diabetes mellitus with kidney disease ([place])   . Diastolic heart failure ([place])   . Gout   . Hypercholesteremia   . Hypertension   . Obesity   . OSA (obstructive sleep apnea)   . Oxygen deficiency      Family History  Problem Relation Age of Onset  . Heart disease Father    . Heart attack Father   . Stroke Father   . Heart disease Sister   . Cancer - Lung Sister   . Sickle cell trait Other        all family members  . Cancer Brother        bone  . Diabetes Brother   . Diabetes Sister   . Thyroid disease Mother   . Goiter Mother      Current Outpatient Medications:  .  acetaminophen (TYLENOL) 650 MG CR tablet, Take 1,300 mg by mouth every 8 (eight) hours as needed for pain., Disp: , Rfl:  .  allopurinol (ZYLOPRIM) 100 MG tablet, Take 1 tablet (100 mg total) by mouth daily., Disp: 90 tablet, Rfl: 2 .  amLODipine (NORVASC) 5 MG tablet, TAKE ONE TABLET BY MOUTH DAILY, Disp: 90 tablet, Rfl: 0 .  aspirin EC 81 MG tablet, Take 81 mg by mouth daily., Disp: , Rfl:  .  calcium carbonate (TUMS - DOSED IN MG ELEMENTAL CALCIUM) 500 MG chewable tablet, Chew 1 tablet by mouth daily. 2 per day, Disp: , Rfl:  .  Cholecalciferol (VITAMIN D) 2000 UNITS CAPS, Take 1 capsule by mouth daily., Disp: , Rfl:  .  colchicine 0.6 MG tablet, Take 1 tablet (0.6 mg total) by mouth daily., Disp: 30 tablet, Rfl: 2 .  ferrous sulfate 325 (65 FE) MG EC tablet, Take 325 mg by mouth daily., Disp: , Rfl:  .  Flaxseed, Linseed, (FLAXSEED OIL MAX STR) 1300 MG CAPS, Take 1 capsule by mouth daily., Disp: , Rfl:  .  folic acid (FOLVITE) 1 MG tablet, TAKE ONE TABLET BY MOUTH DAILY, Disp: 90 tablet, Rfl: 10 .  glucose blood (CONTOUR NEXT TEST) test strip, Use as instructed to check blood sugars 2 times per day dx: e11.22, Disp: 150 strip, Rfl: 2 .  HUMALOG KWIKPEN 200 UNIT/ML SOPN, INJECT 6 UNITS BY SUBCUTANEOUS ROUTE  AT BREAKFAST, 8 UNITS AT LUNCH AND 10 UNITS AT DINNER, Disp: 6 pen, Rfl: 4 .  metoprolol succinate (TOPROL-XL) 25 MG 24 hr tablet, TAKE ONE TABLET BY MOUTH DAILY, Disp: 90 tablet, Rfl: 0 .  Multiple Vitamin (MULTIVITAMIN WITH MINERALS) TABS tablet, Take 1 tablet by mouth daily., Disp: , Rfl:  .  nitroGLYCERIN (NITROSTAT) 0.4 MG SL tablet, Place 0.4 mg under the tongue every 5 (five)  minutes as needed for chest pain., Disp: , Rfl:  .  NOVOFINE 32G X 6 MM MISC, USE 5 TO 8 NEEDLES PER DAY AS DIRECTED PER INSULIN PROTOCOL, Disp: 300 each, Rfl: 9 .  Pitavastatin Calcium 4 MG TABS, Take 4 mg by mouth daily., Disp: , Rfl:  .  Potassium Gluconate 550 MG TABS, Take 1 tablet by mouth daily., Disp: , Rfl:  .  ranitidine (ZANTAC) 150 MG tablet, Take 150 mg by mouth daily as needed for heartburn., Disp: , Rfl:  .  spironolactone (ALDACTONE) 25 MG tablet, TAKE ONE TABLET BY MOUTH DAILY, Disp: 90 tablet, Rfl: 0 .  torsemide (DEMADEX) 20 MG tablet, TAKE ONE TABLET BY MOUTH DAILY, Disp: 90 tablet, Rfl: 0 .  TOUJEO SOLOSTAR 300 UNIT/ML SOPN, INJECT 62 UNITS SUBCUTANEOUSLY EVERY NIGHT AT BEDTIME, Disp: 18 pen, Rfl: 1 .  Lancets (ACCU-CHEK MULTICLIX) lancets, Use as instructed to check blood sugars 2 times per day dx: e11.22, Disp: 150 each, Rfl: 3   Allergies  Allergen Reactions  . Allspice [Pimenta] Hives and Shortness Of Breath  . Black Cohosh Hives and Shortness Of Breath  . Peach [Prunus Persica] Hives and Shortness Of Breath    Fresh peaches  . Ivp Dye [Iodinated Diagnostic Agents] Other (See Comments)    Reaction unknown  . Tetracyclines & Related Other (See Comments)    Reaction unknown     Review of Systems  Constitutional: Negative.   Eyes: Negative for blurred vision.  Respiratory: Negative.  Negative for shortness of breath.   Cardiovascular: Negative.  Negative for chest pain and palpitations.  Gastrointestinal: Negative.   Musculoskeletal: Positive for arthralgias.       She c/o left hip pain. It has somewhat improved since she completed PT.   Neurological: Negative.   Psychiatric/Behavioral: Negative.      Today's Vitals   12/29/18 1612  BP: 132/84  Pulse: (!) 103  Temp: 98.4 F (36.9 C)  TempSrc: Oral  Weight: 243 lb 3.2 oz (110.3 kg)  Height: 5' 3"  (1.6 m)  PainSc: 3   PainLoc: Hip   Body mass index is 43.08 kg/m.   Objective:  Physical  Exam Vitals signs and nursing note reviewed.  Constitutional:      Appearance: Normal appearance. She is obese.  HENT:     Head: Normocephalic and atraumatic.  Cardiovascular:     Rate and Rhythm: Normal rate and regular rhythm.     Heart sounds: Normal heart sounds.  Pulmonary:     Effort: Pulmonary effort is normal.     Breath sounds: Normal breath sounds.  Musculoskeletal:     Left shoulder: She exhibits tenderness.  Skin:    General: Skin is warm.  Neurological:     General: No focal deficit present.     Mental Status: She is alert.  Psychiatric:  Mood and Affect: Mood normal.        Behavior: Behavior normal.         Assessment And Plan:     1. Type 2 diabetes mellitus with stage 3 chronic kidney disease, with long-term current use of insulin, unspecified whether stage 3a or 3b CKD (HCC)  Chronic. I will check labs as listed below. Importance of dietary compliance was discussed with the patient. She was also advised to decrease her basal insulin by 4 units. She is encouraged to send weekly reports of BS logs.   - CBC no Diff - CMP14+EGFR - Hemoglobin A1c  2. Pulmonary hypertension (HCC)  Chronic, yet stable.   3. Primary osteoarthritis of left hip  Chronic. She does not wish to have Ortho evaluation at this time.   4. Gout, unspecified cause, unspecified chronicity, unspecified site  Chronic, yet stable. I will check uric acid level today. - Uric acid  5. Acute pain of left shoulder  She declined PT evaluation. She was given exercises to perform upon awakening and throughout the day. She will let me know if her sx persist.   6. Class 3 severe obesity due to excess calories with serious comorbidity and body mass index (BMI) of 40.0 to 44.9 in adult [facility])  Importance of achieving optimal weight to decrease risk of cardiovascular disease and cancers was discussed with the patient in full detail. She is encouraged to start slowly - start with 10 minutes  twice daily at least three to four days per week and to gradually build to 30 minutes five days weekly. She was given tips to incorporate more activity into her daily routine - take stairs when possible, park farther away from grocery stores, etc.     Maximino Greenland, MD    THE PATIENT IS ENCOURAGED TO PRACTICE SOCIAL DISTANCING DUE TO THE COVID-19 PANDEMIC.

## 2018-12-29 NOTE — Patient Instructions (Signed)
Decrease Humalog to 6 units before breakfast/lunch and dinner  Decrease Toujeo to 58 units nightly    Diabetes Mellitus and Exercise Exercising regularly is important for your overall health, especially when you have diabetes (diabetes mellitus). Exercising is not only about losing weight. It has many other health benefits, such as increasing muscle strength and bone density and reducing body fat and stress. This leads to improved fitness, flexibility, and endurance, all of which result in better overall health. Exercise has additional benefits for people with diabetes, including:  Reducing appetite.  Helping to lower and control blood glucose.  Lowering blood pressure.  Helping to control amounts of fatty substances (lipids) in the blood, such as cholesterol and triglycerides.  Helping the body to respond better to insulin (improving insulin sensitivity).  Reducing how much insulin the body needs.  Decreasing the risk for heart disease by: ? Lowering cholesterol and triglyceride levels. ? Increasing the levels of good cholesterol. ? Lowering blood glucose levels. What is my activity plan? Your health care provider or certified diabetes educator can help you make a plan for the type and frequency of exercise (activity plan) that works for you. Make sure that you:  Do at least 150 minutes of moderate-intensity or vigorous-intensity exercise each week. This could be brisk walking, biking, or water aerobics. ? Do stretching and strength exercises, such as yoga or weightlifting, at least 2 times a week. ? Spread out your activity over at least 3 days of the week.  Get some form of physical activity every day. ? Do not go more than 2 days in a row without some kind of physical activity. ? Avoid being inactive for more than 30 minutes at a time. Take frequent breaks to walk or stretch.  Choose a type of exercise or activity that you enjoy, and set realistic goals.  Start slowly, and  gradually increase the intensity of your exercise over time. What do I need to know about managing my diabetes?   Check your blood glucose before and after exercising. ? If your blood glucose is 240 mg/dL (13.3 mmol/L) or higher before you exercise, check your urine for ketones. If you have ketones in your urine, do not exercise until your blood glucose returns to normal. ? If your blood glucose is 100 mg/dL (5.6 mmol/L) or lower, eat a snack containing 15-20 grams of carbohydrate. Check your blood glucose 15 minutes after the snack to make sure that your level is above 100 mg/dL (5.6 mmol/L) before you start your exercise.  Know the symptoms of low blood glucose (hypoglycemia) and how to treat it. Your risk for hypoglycemia increases during and after exercise. Common symptoms of hypoglycemia can include: ? Hunger. ? Anxiety. ? Sweating and feeling clammy. ? Confusion. ? Dizziness or feeling light-headed. ? Increased heart rate or palpitations. ? Blurry vision. ? Tingling or numbness around the mouth, lips, or tongue. ? Tremors or shakes. ? Irritability.  Keep a rapid-acting carbohydrate snack available before, during, and after exercise to help prevent or treat hypoglycemia.  Avoid injecting insulin into areas of the body that are going to be exercised. For example, avoid injecting insulin into: ? The arms, when playing tennis. ? The legs, when jogging.  Keep records of your exercise habits. Doing this can help you and your health care provider adjust your diabetes management plan as needed. Write down: ? Food that you eat before and after you exercise. ? Blood glucose levels before and after you exercise. ? The  type and amount of exercise you have done. ? When your insulin is expected to peak, if you use insulin. Avoid exercising at times when your insulin is peaking.  When you start a new exercise or activity, work with your health care provider to make sure the activity is safe  for you, and to adjust your insulin, medicines, or food intake as needed.  Drink plenty of water while you exercise to prevent dehydration or heat stroke. Drink enough fluid to keep your urine clear or pale yellow. Summary  Exercising regularly is important for your overall health, especially when you have diabetes (diabetes mellitus).  Exercising has many health benefits, such as increasing muscle strength and bone density and reducing body fat and stress.  Your health care provider or certified diabetes educator can help you make a plan for the type and frequency of exercise (activity plan) that works for you.  When you start a new exercise or activity, work with your health care provider to make sure the activity is safe for you, and to adjust your insulin, medicines, or food intake as needed. This information is not intended to replace advice given to you by your health care provider. Make sure you discuss any questions you have with your health care provider. Document Released: 03/29/2003 Document Revised: 07/31/2016 Document Reviewed: 06/18/2015 Elsevier Patient Education  2020 [employer].

## 2018-12-30 ENCOUNTER — Other Ambulatory Visit: Payer: Self-pay

## 2018-12-30 LAB — HEMOGLOBIN A1C
Est. average glucose Bld gHb Est-mCnc: 154 mg/dL
Hgb A1c MFr Bld: 7 % — ABNORMAL HIGH (ref 4.8–5.6)

## 2018-12-30 LAB — CMP14+EGFR
ALT: 15 IU/L (ref 0–32)
AST: 18 IU/L (ref 0–40)
Albumin/Globulin Ratio: 1.1 — ABNORMAL LOW (ref 1.2–2.2)
Albumin: 3.9 g/dL (ref 3.8–4.8)
Alkaline Phosphatase: 145 IU/L — ABNORMAL HIGH (ref 39–117)
BUN/Creatinine Ratio: 23 (ref 12–28)
BUN: 26 mg/dL (ref 8–27)
Bilirubin Total: 0.3 mg/dL (ref 0.0–1.2)
CO2: 25 mmol/L (ref 20–29)
Calcium: 10.1 mg/dL (ref 8.7–10.3)
Chloride: 98 mmol/L (ref 96–106)
Creatinine, Ser: 1.14 mg/dL — ABNORMAL HIGH (ref 0.57–1.00)
GFR calc Af Amer: 57 mL/min/{1.73_m2} — ABNORMAL LOW (ref >59)
GFR calc non Af Amer: 50 mL/min/{1.73_m2} — ABNORMAL LOW (ref >59)
Globulin, Total: 3.7 g/dL (ref 1.5–4.5)
Glucose: 90 mg/dL (ref 65–99)
Potassium: 4.1 mmol/L (ref 3.5–5.2)
Sodium: 138 mmol/L (ref 134–144)
Total Protein: 7.6 g/dL (ref 6.0–8.5)

## 2018-12-30 LAB — CBC
Hematocrit: 46.5 % (ref 34.0–46.6)
Hemoglobin: 13.5 g/dL (ref 11.1–15.9)
MCH: 18.9 pg — ABNORMAL LOW (ref 26.6–33.0)
MCHC: 29 g/dL — ABNORMAL LOW (ref 31.5–35.7)
MCV: 65 fL — ABNORMAL LOW (ref 79–97)
Platelets: 289 10*3/uL (ref 150–450)
RBC: 7.14 x10E6/uL (ref 3.77–5.28)
RDW: 23 % — ABNORMAL HIGH (ref 11.7–15.4)
WBC: 10.7 10*3/uL (ref 3.4–10.8)

## 2018-12-30 LAB — URIC ACID: Uric Acid: 10.3 mg/dL — ABNORMAL HIGH (ref 3.0–7.2)

## 2018-12-30 MED ORDER — ACCU-CHEK FASTCLIX LANCETS MISC: 2 refills | Status: AC

## 2018-12-30 MED ORDER — ACCU-CHEK FASTCLIX LANCETS MISC: 2 refills | Status: DC

## 2019-01-05 ENCOUNTER — Telehealth: Payer: Self-pay

## 2019-01-09 ENCOUNTER — Other Ambulatory Visit: Payer: Self-pay | Admitting: Internal Medicine

## 2019-01-10 ENCOUNTER — Encounter: Payer: Self-pay | Admitting: *Deleted

## 2019-01-10 NOTE — Progress Notes (Signed)
Received fax request from [facility] to refill Folic Acid prescription. Per Laverna Peace, NP, due not refill it. Patient has not been seen in this office for two years. If she wants a refill, she needs to make an appointment. Faxed prescription back to pharmacy informing them.

## 2019-01-12 ENCOUNTER — Telehealth: Payer: Medicare Other

## 2019-02-02 ENCOUNTER — Other Ambulatory Visit: Payer: Self-pay | Admitting: Internal Medicine

## 2019-02-02 ENCOUNTER — Other Ambulatory Visit: Payer: Self-pay | Admitting: Cardiology

## 2019-02-07 ENCOUNTER — Ambulatory Visit: Payer: Self-pay | Admitting: Pharmacist

## 2019-02-07 NOTE — Progress Notes (Signed)
  Chronic Care Management   Outreach Note  02/07/2019 Name: Anna Clay MRN: [id number] DOB: [birth year]  Referred by: Glendale Chard, MD Reason for referral : Chronic Care Management (Diabetes)   An unsuccessful telephone outreach was attempted today. The patient was referred to the case management team by for assistance with care management and care coordination.   Follow Up Plan: A HIPPA compliant phone message was left for the patient providing contact information and requesting a return call.  The care management team will reach out to the patient again over the next 7-10 days.   SIGNATURE Regina Eck, PharmD, BCPS Clinical Pharmacist, [facility] [place]: [phone]

## 2019-02-11 ENCOUNTER — Ambulatory Visit (INDEPENDENT_AMBULATORY_CARE_PROVIDER_SITE_OTHER): Payer: Medicare Other | Admitting: Pharmacist

## 2019-02-11 DIAGNOSIS — E1122 Type 2 diabetes mellitus with diabetic chronic kidney disease: Secondary | ICD-10-CM | POA: Diagnosis not present

## 2019-02-11 DIAGNOSIS — Z794 Long term (current) use of insulin: Secondary | ICD-10-CM

## 2019-02-11 DIAGNOSIS — N183 Chronic kidney disease, stage 3 unspecified: Secondary | ICD-10-CM

## 2019-02-11 NOTE — Progress Notes (Signed)
Chronic Care Management   Visit Note  02/11/2019 Name: Anna Clay MRN: [id number] DOB: 11-Sep-1950  Referred by: Glendale Chard, MD Reason for referral : Chronic Care Management   Anna Clay is a 69 y.o. year old female who is a primary care patient of Glendale Chard, MD. The CCM team was consulted for assistance with chronic disease management and care coordination needs related to DMII  Review of patient status, including review of consultants reports, relevant laboratory and other test results, and collaboration with appropriate care team members and the patient's provider was performed as part of comprehensive patient evaluation and provision of chronic care management services.    Patient was contacted via telephone for today's visit regarding her diabetes and chronic care management   Medications: Outpatient Encounter Medications as of 02/11/2019  Medication Sig  . Accu-Chek FastClix Lancets MISC USE AS DIRECTED TO CHECK BLOOD SUGARS 2 TIMES PER DAY DX:E11.65  . acetaminophen (TYLENOL) 650 MG CR tablet Take 1,300 mg by mouth every 8 (eight) hours as needed for pain.  Marland Kitchen allopurinol (ZYLOPRIM) 100 MG tablet Take 1 tablet (100 mg total) by mouth daily.  Marland Kitchen amLODipine (NORVASC) 5 MG tablet TAKE ONE TABLET BY MOUTH DAILY  . aspirin EC 81 MG tablet Take 81 mg by mouth daily.  . calcium carbonate (TUMS - DOSED IN MG ELEMENTAL CALCIUM) 500 MG chewable tablet Chew 1 tablet by mouth daily. 2 per day  . Cholecalciferol (VITAMIN D) 2000 UNITS CAPS Take 1 capsule by mouth daily.  . colchicine 0.6 MG tablet Take 1 tablet (0.6 mg total) by mouth daily.  . ferrous sulfate 325 (65 FE) MG EC tablet Take 325 mg by mouth daily.  . Flaxseed, Linseed, (FLAXSEED OIL MAX STR) 1300 MG CAPS Take 1 capsule by mouth daily.  . folic acid (FOLVITE) 1 MG tablet TAKE ONE TABLET BY MOUTH DAILY  . glucose blood (CONTOUR NEXT TEST) test strip Use as instructed to check blood sugars 2 times per day dx: e11.22  .  HUMALOG KWIKPEN 200 UNIT/ML SOPN INJECT 6 UNITS BY SUBCUTANEOUS ROUTE  AT BREAKFAST, 8 UNITS AT LUNCH AND 10 UNITS AT DINNER  . metoprolol succinate (TOPROL-XL) 25 MG 24 hr tablet TAKE ONE TABLET BY MOUTH DAILY  . Multiple Vitamin (MULTIVITAMIN WITH MINERALS) TABS tablet Take 1 tablet by mouth daily.  . nitroGLYCERIN (NITROSTAT) 0.4 MG SL tablet Place 0.4 mg under the tongue every 5 (five) minutes as needed for chest pain.  Marland Kitchen NOVOFINE 32G X 6 MM MISC USE 5 TO 8 NEEDLES PER DAY AS DIRECTED PER INSULIN PROTOCOL  . Pitavastatin Calcium 4 MG TABS Take 4 mg by mouth daily.  . Potassium Gluconate 550 MG TABS Take 1 tablet by mouth daily.  . ranitidine (ZANTAC) 150 MG tablet Take 150 mg by mouth daily as needed for heartburn.  . spironolactone (ALDACTONE) 25 MG tablet TAKE ONE TABLET BY MOUTH DAILY  . torsemide (DEMADEX) 20 MG tablet TAKE ONE TABLET BY MOUTH DAILY  . TOUJEO SOLOSTAR 300 UNIT/ML SOPN INJECT 62 UNITS SUBCUTANEOUSLY EVERY NIGHT AT BEDTIME (Patient taking differently: 58 Units. )   No facility-administered encounter medications on file as of 02/11/2019.     Objective:   Goals Addressed            This Visit's Progress     Patient Stated   . I would like to manage my diabetes (pt-stated)       Current Barriers:  . Diabetes: I7PO; complicated by  chronic medical conditions including HTN, HLD, most recent A1c 7% ON 12/29/18 . Current antihyperglycemic regimen: Toujeo 58 units qHS, Humalog meal time 6 units breakfast, 8 units lunch, 10 units with dinner o Patient's Bgs range 80-100 o Denies hypoglycemia (Bg<70) o Reports one incident where BG was 74 (due to no carbohydrate dinner and light lunch) o Continue current medication regimen as above . Reports hypoglycemic symptoms, including dizziness, lightheadedness, shaking, sweating (3 weeks ago) . Denies hyperglycemic symptoms . Current meal patterns: reports eating better, reducing carbohydrate intake.  Her son is helping to prepare  meals, etc . Current exercise: reports light walking . Cardiovascular risk reduction: o Current hypertensive regimen: amlodipine, metoprolol. spironolactone o Current hyperlipidemia regimen: pitivastatin - LDL on 4/29 was 100 o Current antiplatelet regimen: n/a  Pharmacist Clinical Goal(s):  Marland Kitchen Over the next 90 days, patient will work with PharmD and primary care provider to address needs related to optimization of medication management of chronic conditions  Interventions: . Comprehensive medication review performed, medication list updated in electronic medical record . Reviewed & discussed the following diabetes-related information with patient: o Follow ADA recommended "diabetes-friendly" diet  (reviewed healthy snack/food options) o Discussed insulin injection technique o Reviewed medication purpose/side effects-->counseled on signs/symptoms of hypoglycemia  Patient Self Care Activities:  . Patient will check blood glucose 3-4 times daily as directed , document, and provide at future appointments . Patient will focus on medication adherence by continuing to take medications as prescribed . Patient will take medications as prescribed . Patient will contact provider with any episodes of hypoglycemia . Patient will report any questions or concerns to provider   Initial goal documentation         Plan:   The care management team will reach out to the patient again over the next 60 days.   Provider Signature Regina Eck, PharmD, BCPS Clinical Pharmacist, [facility] [place]: [phone]

## 2019-02-11 NOTE — Patient Instructions (Signed)
Visit Information  Goals Addressed            This Visit's Progress     Patient Stated   . I would like to manage my diabetes (pt-stated)       Current Barriers:  . Diabetes: P2AE; complicated by chronic medical conditions including HTN, HLD, most recent A1c 7% ON 12/29/18 . Current antihyperglycemic regimen: Toujeo 58 units qHS, Humalog meal time 6 units breakfast, 8 units lunch, 10 units with dinner o Patient's Bgs range 80-100 o Denies hypoglycemia (Bg<70) o Reports one incident where BG was 74 (due to no carbohydrate dinner and light lunch) o Continue current medication regimen as above . Reports hypoglycemic symptoms, including dizziness, lightheadedness, shaking, sweating (3 weeks ago) . Denies hyperglycemic symptoms . Current meal patterns: reports eating better, reducing carbohydrate intake.  Her son is helping to prepare meals, etc . Current exercise: reports light walking . Cardiovascular risk reduction: o Current hypertensive regimen: amlodipine, metoprolol. spironolactone o Current hyperlipidemia regimen: pitivastatin - LDL on 4/29 was 100 o Current antiplatelet regimen: n/a  Pharmacist Clinical Goal(s):  Marland Kitchen Over the next 90 days, patient will work with PharmD and primary care provider to address needs related to optimization of medication management of chronic conditions  Interventions: . Comprehensive medication review performed, medication list updated in electronic medical record . Reviewed & discussed the following diabetes-related information with patient: o Follow ADA recommended "diabetes-friendly" diet  (reviewed healthy snack/food options) o Discussed insulin injection technique o Reviewed medication purpose/side effects-->counseled on signs/symptoms of hypoglycemia  Patient Self Care Activities:  . Patient will check blood glucose 3-4 times daily as directed , document, and provide at future appointments . Patient will focus on medication adherence by  continuing to take medications as prescribed . Patient will take medications as prescribed . Patient will contact provider with any episodes of hypoglycemia . Patient will report any questions or concerns to provider   Initial goal documentation        The patient verbalized understanding of instructions provided today and declined a print copy of patient instruction materials.   The care management team will reach out to the patient again over the next 60 days.   SIGNATURE Regina Eck, PharmD, BCPS Clinical Pharmacist, [facility] [place]: [phone]

## 2019-02-14 ENCOUNTER — Telehealth: Payer: Self-pay

## 2019-02-21 ENCOUNTER — Other Ambulatory Visit: Payer: Self-pay | Admitting: Internal Medicine

## 2019-02-22 ENCOUNTER — Ambulatory Visit: Payer: Medicare Other

## 2019-02-22 ENCOUNTER — Ambulatory Visit: Payer: Medicare Other | Admitting: Internal Medicine

## 2019-02-23 ENCOUNTER — Telehealth: Payer: Medicare Other

## 2019-03-18 ENCOUNTER — Telehealth: Payer: Medicare Other

## 2019-03-23 ENCOUNTER — Ambulatory Visit: Payer: Medicare Other | Admitting: Internal Medicine

## 2019-04-06 ENCOUNTER — Other Ambulatory Visit: Payer: Self-pay

## 2019-04-06 ENCOUNTER — Ambulatory Visit (INDEPENDENT_AMBULATORY_CARE_PROVIDER_SITE_OTHER): Payer: Medicare Other | Admitting: Internal Medicine

## 2019-04-06 ENCOUNTER — Encounter: Payer: Self-pay | Admitting: Internal Medicine

## 2019-04-06 ENCOUNTER — Ambulatory Visit (INDEPENDENT_AMBULATORY_CARE_PROVIDER_SITE_OTHER): Payer: Medicare Other

## 2019-04-06 VITALS — BP 138/78 | HR 98 | Temp 98.2°F | Ht 63.0 in | Wt 246.6 lb

## 2019-04-06 DIAGNOSIS — N183 Chronic kidney disease, stage 3 unspecified: Secondary | ICD-10-CM | POA: Diagnosis not present

## 2019-04-06 DIAGNOSIS — Z794 Long term (current) use of insulin: Secondary | ICD-10-CM

## 2019-04-06 DIAGNOSIS — D573 Sickle-cell trait: Secondary | ICD-10-CM | POA: Diagnosis not present

## 2019-04-06 DIAGNOSIS — M109 Gout, unspecified: Secondary | ICD-10-CM | POA: Diagnosis not present

## 2019-04-06 DIAGNOSIS — E1122 Type 2 diabetes mellitus with diabetic chronic kidney disease: Secondary | ICD-10-CM

## 2019-04-06 DIAGNOSIS — Z0000 Encounter for general adult medical examination without abnormal findings: Secondary | ICD-10-CM

## 2019-04-06 DIAGNOSIS — I272 Pulmonary hypertension, unspecified: Secondary | ICD-10-CM | POA: Diagnosis not present

## 2019-04-06 DIAGNOSIS — Z6841 Body Mass Index (BMI) 40.0 and over, adult: Secondary | ICD-10-CM | POA: Diagnosis not present

## 2019-04-06 DIAGNOSIS — E2839 Other primary ovarian failure: Secondary | ICD-10-CM

## 2019-04-06 DIAGNOSIS — Z23 Encounter for immunization: Secondary | ICD-10-CM | POA: Diagnosis not present

## 2019-04-06 LAB — POCT UA - MICROALBUMIN
Albumin/Creatinine Ratio, Urine, POC: >300
Creatinine, POC: 50 mg/dL
Microalbumin Ur, POC: 150 mg/L

## 2019-04-06 LAB — POCT URINALYSIS DIPSTICK
Bilirubin, UA: NEGATIVE
Blood, UA: NEGATIVE
Glucose, UA: NEGATIVE
Ketones, UA: NEGATIVE
Leukocytes, UA: NEGATIVE
Nitrite, UA: NEGATIVE
Protein, UA: POSITIVE — AB
Spec Grav, UA: 1.02 (ref 1.010–1.025)
Urobilinogen, UA: 0.2 E.U./dL
pH, UA: 7 (ref 5.0–8.0)

## 2019-04-06 MED ORDER — EPINEPHRINE 0.3 MG/0.3ML IJ SOAJ: 0.3000 mg | INTRAMUSCULAR | 1 refills | Status: AC | PRN

## 2019-04-06 MED ORDER — ALLOPURINOL 100 MG PO TABS: 100.0000 mg | ORAL_TABLET | Freq: Every day | ORAL | 2 refills | Status: DC

## 2019-04-06 NOTE — Patient Instructions (Signed)
Anna Clay , Thank you for taking time to come for your Medicare Wellness Visit. I appreciate your ongoing commitment to your health goals. Please review the following plan we discussed and let me know if I can assist you in the future.   Screening recommendations/referrals: Colonoscopy: 08/2015 Mammogram: 09/2018 Bone Density: ordered Recommended yearly ophthalmology/optometry visit for glaucoma screening and checkup Recommended yearly dental visit for hygiene and checkup  Vaccinations: Influenza vaccine: 11/2018 Pneumococcal vaccine: 01/2018 Tdap vaccine: 10/2018 Shingles vaccine: discussed    Advanced directives: Advance directive discussed with you today. I have provided a copy for you to complete at home and have notarized. Once this is complete please bring a copy in to our office so we can scan it into your chart.   Conditions/risks identified: obesity  Next appointment:    Preventive Care 68 Years and Older, Female Preventive care refers to lifestyle choices and visits with your health care provider that can promote health and wellness. What does preventive care include?  A yearly physical exam. This is also called an annual well check.  Dental exams once or twice a year.  Routine eye exams. Ask your health care provider how often you should have your eyes checked.  Personal lifestyle choices, including:  Daily care of your teeth and gums.  Regular physical activity.  Eating a healthy diet.  Avoiding tobacco and drug use.  Limiting alcohol use.  Practicing safe sex.  Taking low-dose aspirin every day.  Taking vitamin and mineral supplements as recommended by your health care provider. What happens during an annual well check? The services and screenings done by your health care provider during your annual well check will depend on your age, overall health, lifestyle risk factors, and family history of disease. Counseling  Your health care provider may ask you  questions about your:  Alcohol use.  Tobacco use.  Drug use.  Emotional well-being.  Home and relationship well-being.  Sexual activity.  Eating habits.  History of falls.  Memory and ability to understand (cognition).  Work and work Statistician.  Reproductive health. Screening  You may have the following tests or measurements:  Height, weight, and BMI.  Blood pressure.  Lipid and cholesterol levels. These may be checked every 5 years, or more frequently if you are over 55 years old.  Skin check.  Lung cancer screening. You may have this screening every year starting at age 50 if you have a 30-pack-year history of smoking and currently smoke or have quit within the past 15 years.  Fecal occult blood test (FOBT) of the stool. You may have this test every year starting at age 71.  Flexible sigmoidoscopy or colonoscopy. You may have a sigmoidoscopy every 5 years or a colonoscopy every 10 years starting at age 23.  Hepatitis C blood test.  Hepatitis B blood test.  Sexually transmitted disease (STD) testing.  Diabetes screening. This is done by checking your blood sugar (glucose) after you have not eaten for a while (fasting). You may have this done every 1-3 years.  Bone density scan. This is done to screen for osteoporosis. You may have this done starting at age 48.  Mammogram. This may be done every 1-2 years. Talk to your health care provider about how often you should have regular mammograms. Talk with your health care provider about your test results, treatment options, and if necessary, the need for more tests. Vaccines  Your health care provider may recommend certain vaccines, such as:  Influenza vaccine.  This is recommended every year.  Tetanus, diphtheria, and acellular pertussis (Tdap, Td) vaccine. You may need a Td booster every 10 years.  Zoster vaccine. You may need this after age 50.  Pneumococcal 13-valent conjugate (PCV13) vaccine. One dose is  recommended after age 79.  Pneumococcal polysaccharide (PPSV23) vaccine. One dose is recommended after age 75. Talk to your health care provider about which screenings and vaccines you need and how often you need them. This information is not intended to replace advice given to you by your health care provider. Make sure you discuss any questions you have with your health care provider. Document Released: 02/02/2015 Document Revised: 09/26/2015 Document Reviewed: 11/07/2014 Elsevier Interactive Patient Education  2017 [employer] Prevention in the Home Falls can cause injuries. They can happen to people of all ages. There are many things you can do to make your home safe and to help prevent falls. What can I do on the outside of my home?  Regularly fix the edges of walkways and driveways and fix any cracks.  Remove anything that might make you trip as you walk through a door, such as a raised step or threshold.  Trim any bushes or trees on the path to your home.  Use bright outdoor lighting.  Clear any walking paths of anything that might make someone trip, such as rocks or tools.  Regularly check to see if handrails are loose or broken. Make sure that both sides of any steps have handrails.  Any raised decks and porches should have guardrails on the edges.  Have any leaves, snow, or ice cleared regularly.  Use sand or salt on walking paths during winter.  Clean up any spills in your garage right away. This includes oil or grease spills. What can I do in the bathroom?  Use night lights.  Install grab bars by the toilet and in the tub and shower. Do not use towel bars as grab bars.  Use non-skid mats or decals in the tub or shower.  If you need to sit down in the shower, use a plastic, non-slip stool.  Keep the floor dry. Clean up any water that spills on the floor as soon as it happens.  Remove soap buildup in the tub or shower regularly.  Attach bath mats  securely with double-sided non-slip rug tape.  Do not have throw rugs and other things on the floor that can make you trip. What can I do in the bedroom?  Use night lights.  Make sure that you have a light by your bed that is easy to reach.  Do not use any sheets or blankets that are too big for your bed. They should not hang down onto the floor.  Have a firm chair that has side arms. You can use this for support while you get dressed.  Do not have throw rugs and other things on the floor that can make you trip. What can I do in the kitchen?  Clean up any spills right away.  Avoid walking on wet floors.  Keep items that you use a lot in easy-to-reach places.  If you need to reach something above you, use a strong step stool that has a grab bar.  Keep electrical cords out of the way.  Do not use floor polish or wax that makes floors slippery. If you must use wax, use non-skid floor wax.  Do not have throw rugs and other things on the floor that can make  you trip. What can I do with my stairs?  Do not leave any items on the stairs.  Make sure that there are handrails on both sides of the stairs and use them. Fix handrails that are broken or loose. Make sure that handrails are as long as the stairways.  Check any carpeting to make sure that it is firmly attached to the stairs. Fix any carpet that is loose or worn.  Avoid having throw rugs at the top or bottom of the stairs. If you do have throw rugs, attach them to the floor with carpet tape.  Make sure that you have a light switch at the top of the stairs and the bottom of the stairs. If you do not have them, ask someone to add them for you. What else can I do to help prevent falls?  Wear shoes that:  Do not have high heels.  Have rubber bottoms.  Are comfortable and fit you well.  Are closed at the toe. Do not wear sandals.  If you use a stepladder:  Make sure that it is fully opened. Do not climb a closed  stepladder.  Make sure that both sides of the stepladder are locked into place.  Ask someone to hold it for you, if possible.  Clearly mark and make sure that you can see:  Any grab bars or handrails.  First and last steps.  Where the edge of each step is.  Use tools that help you move around (mobility aids) if they are needed. These include:  Canes.  Walkers.  Scooters.  Crutches.  Turn on the lights when you go into a dark area. Replace any light bulbs as soon as they burn out.  Set up your furniture so you have a clear path. Avoid moving your furniture around.  If any of your floors are uneven, fix them.  If there are any pets around you, be aware of where they are.  Review your medicines with your doctor. Some medicines can make you feel dizzy. This can increase your chance of falling. Ask your doctor what other things that you can do to help prevent falls. This information is not intended to replace advice given to you by your health care provider. Make sure you discuss any questions you have with your health care provider. Document Released: 11/02/2008 Document Revised: 06/14/2015 Document Reviewed: 02/10/2014 Elsevier Interactive Patient Education  2017 [employer].

## 2019-04-06 NOTE — Progress Notes (Signed)
This visit occurred during the SARS-CoV-2 public health emergency.  Safety protocols were in place, including screening questions prior to the visit, additional usage of staff PPE, and extensive cleaning of exam room while observing appropriate contact time as indicated for disinfecting solutions.  Subjective:     Patient ID: Anna Clay , female    DOB: February 22, 1950 , 69 y.o.   MRN: [id number]   Chief Complaint  Patient presents with  . Hypertension  . Diabetes    HPI  She is here today for f/u DM and HTN. She reports compliance with meds. She has yet to establish care with cardiologist in [place]. She has yet to receive COVID vaccine. She is allergic to iodine and is concerned she may have a reaction.   Hypertension This is a chronic problem. The current episode started more than 1 year ago. The problem has been gradually improving since onset. The problem is controlled. Pertinent negatives include no blurred vision, chest pain, palpitations or shortness of breath.  Diabetes She presents for her follow-up diabetic visit. She has type 2 diabetes mellitus. There are no hypoglycemic associated symptoms. Pertinent negatives for diabetes include no blurred vision and no chest pain. There are no hypoglycemic complications. Diabetic complications include nephropathy. Risk factors for coronary artery disease include diabetes mellitus, dyslipidemia, hypertension, obesity, post-menopausal and sedentary lifestyle. She participates in exercise intermittently. An ACE inhibitor/angiotensin II receptor blocker is being taken.     Past Medical History:  Diagnosis Date  . Chest pain   . Diabetes mellitus with kidney disease ([place])   . Diastolic heart failure ([place])   . Gout   . Hypercholesteremia   . Hypertension   . Obesity   . OSA (obstructive sleep apnea)   . Oxygen deficiency      Family History  Problem Relation Age of Onset  . Heart disease Father   . Heart attack Father   . Stroke  Father   . Heart disease Sister   . Cancer - Lung Sister   . Sickle cell trait Other        all family members  . Cancer Brother        bone  . Diabetes Brother   . Diabetes Sister   . Thyroid disease Mother   . Goiter Mother      Current Outpatient Medications:  .  Accu-Chek FastClix Lancets MISC, USE AS DIRECTED TO CHECK BLOOD SUGARS 2 TIMES PER DAY DX:E11.65, Disp: 150 each, Rfl: 2 .  acetaminophen (TYLENOL) 650 MG CR tablet, Take 1,300 mg by mouth every 8 (eight) hours as needed for pain., Disp: , Rfl:  .  allopurinol (ZYLOPRIM) 100 MG tablet, Take 1 tablet (100 mg total) by mouth daily., Disp: 90 tablet, Rfl: 2 .  amLODipine (NORVASC) 5 MG tablet, TAKE ONE TABLET BY MOUTH DAILY, Disp: 90 tablet, Rfl: 0 .  aspirin EC 81 MG tablet, Take 81 mg by mouth daily., Disp: , Rfl:  .  calcium carbonate (TUMS - DOSED IN MG ELEMENTAL CALCIUM) 500 MG chewable tablet, Chew 1 tablet by mouth daily. 2 per day, Disp: , Rfl:  .  Cholecalciferol (VITAMIN D) 2000 UNITS CAPS, Take 1 capsule by mouth daily., Disp: , Rfl:  .  colchicine 0.6 MG tablet, Take 1 tablet (0.6 mg total) by mouth daily., Disp: 30 tablet, Rfl: 2 .  EPINEPHrine (EPIPEN 2-PAK) 0.3 mg/0.3 mL IJ SOAJ injection, Inject 0.3 mLs (0.3 mg total) into the muscle as needed for anaphylaxis., Disp: 2  each, Rfl: 1 .  ferrous sulfate 325 (65 FE) MG EC tablet, Take 325 mg by mouth daily., Disp: , Rfl:  .  Flaxseed, Linseed, (FLAXSEED OIL MAX STR) 1300 MG CAPS, Take 1 capsule by mouth daily., Disp: , Rfl:  .  folic acid (FOLVITE) 1 MG tablet, TAKE ONE TABLET BY MOUTH DAILY, Disp: 90 tablet, Rfl: 10 .  glucose blood (CONTOUR NEXT TEST) test strip, Use as instructed to check blood sugars 2 times per day dx: e11.22, Disp: 150 strip, Rfl: 2 .  HUMALOG KWIKPEN 200 UNIT/ML SOPN, INJECT 6 UNITS BY SUBCUTANEOUS ROUTE  AT BREAKFAST, 8 UNITS AT LUNCH AND 10 UNITS AT DINNER, Disp: 6 pen, Rfl: 4 .  metoprolol succinate (TOPROL-XL) 25 MG 24 hr tablet, TAKE ONE  TABLET BY MOUTH DAILY, Disp: 90 tablet, Rfl: 0 .  Multiple Vitamin (MULTIVITAMIN WITH MINERALS) TABS tablet, Take 1 tablet by mouth daily., Disp: , Rfl:  .  nitroGLYCERIN (NITROSTAT) 0.4 MG SL tablet, Place 0.4 mg under the tongue every 5 (five) minutes as needed for chest pain., Disp: , Rfl:  .  NOVOFINE 32G X 6 MM MISC, USE 5 TO 8 NEEDLES PER DAY AS DIRECTED PER INSULIN PROTOCOL, Disp: 300 each, Rfl: 9 .  Pitavastatin Calcium 4 MG TABS, Take 4 mg by mouth daily., Disp: , Rfl:  .  Potassium Gluconate 550 MG TABS, Take 1 tablet by mouth daily., Disp: , Rfl:  .  ranitidine (ZANTAC) 150 MG tablet, Take 150 mg by mouth daily as needed for heartburn., Disp: , Rfl:  .  spironolactone (ALDACTONE) 25 MG tablet, TAKE ONE TABLET BY MOUTH DAILY, Disp: 90 tablet, Rfl: 0 .  torsemide (DEMADEX) 20 MG tablet, TAKE ONE TABLET BY MOUTH DAILY, Disp: 90 tablet, Rfl: 0 .  TOUJEO SOLOSTAR 300 UNIT/ML SOPN, INJECT 62 UNITS SUBCUTANEOUSLY EVERY NIGHT AT BEDTIME (Patient taking differently: 58 Units. ), Disp: 18 pen, Rfl: 1   Allergies  Allergen Reactions  . Allspice [Pimenta] Hives and Shortness Of Breath  . Black Cohosh Hives and Shortness Of Breath  . Peach [Prunus Persica] Hives and Shortness Of Breath    Fresh peaches  . Ivp Dye [Iodinated Diagnostic Agents] Other (See Comments)    Reaction unknown  . Tetracyclines & Related Other (See Comments)    Reaction unknown     Review of Systems  Constitutional: Negative.   Eyes: Negative for blurred vision.  Respiratory: Negative.  Negative for shortness of breath.   Cardiovascular: Negative.  Negative for chest pain and palpitations.  Gastrointestinal: Negative.   Neurological: Negative.   Psychiatric/Behavioral: Negative.      Today's Vitals   04/06/19 1612  BP: 138/78  Pulse: 98  Temp: 98.2 F (36.8 C)  TempSrc: Oral  Weight: 246 lb 9.6 oz (111.9 kg)  Height: 5' 3" (1.6 m)  PainSc: 4   PainLoc: Hip   Body mass index is 43.68 kg/m.    Objective:  Physical Exam Vitals and nursing note reviewed.  Constitutional:      Appearance: Normal appearance. She is obese.  HENT:     Head: Normocephalic and atraumatic.  Cardiovascular:     Rate and Rhythm: Normal rate and regular rhythm.     Heart sounds: Normal heart sounds.  Pulmonary:     Effort: Pulmonary effort is normal.     Breath sounds: Normal breath sounds.  Musculoskeletal:     Comments: Ambulatory with cane  Skin:    General: Skin is warm.  Neurological:  General: No focal deficit present.     Mental Status: She is alert.  Psychiatric:        Mood and Affect: Mood normal.        Behavior: Behavior normal.         Assessment And Plan:     1. Type 2 diabetes mellitus with stage 3 chronic kidney disease, with long-term current use of insulin, unspecified whether stage 3a or 3b CKD (HCC)  Chronic, I will check labs as listed below. Importance of dietary compliance was discussed with the patient. I will refer her for diabetic eye exam. She will rto in 4 months for re-evaluation.   - Ambulatory referral to Ophthalmology - CMP14+EGFR - Lipid panel - Hemoglobin A1c  2. Pulmonary hypertension (HCC)  Chronic, yet stable. She does not wish to have Cardiology referral at this time. She would like to wait until her next visit.   3. Sickle cell trait (HCC)  Chronic, yet stable. Previous CBC performed, hb stable with low MCV.   4. Estrogen deficiency  She agrees to referral for bone density. Encouraged to perform weight-bearing exercises and continue with calcium/vitamin D supplementation.   - DG Bone Density; Future  5. Gout, unspecified cause, unspecified chronicity, unspecified site  Chronic. She was given refill of allopurinol. I will check uric acid level today.   - Uric acid  6. Class 3 severe obesity due to excess calories with serious comorbidity and body mass index (BMI) of 40.0 to 44.9 in adult (HCC)  BMI 43. She is encouraged to strive  for BMI less than 35 to decrease cardiac risk. She is encouraged to perform chair exercises while seated/watching TV.   7. Immunization due  She was advised to move forward with COVID vaccine. She was given rx Epipen and advised to stay for observation at least 30 minutes. She was given pneumovax-23 today, so she was advised to wait at least 2 weeks prior to scheduling her 1st COVID vaccine. She was advised to schedule this no sooner than April 1st.         Maximino Greenland, MD    THE PATIENT IS ENCOURAGED TO PRACTICE SOCIAL DISTANCING DUE TO THE COVID-19 PANDEMIC.

## 2019-04-06 NOTE — Patient Instructions (Signed)
Infection Prevention in the Home If you have an infection, may have been exposed to an infection, or are taking care of someone who has an infection, it is important to know how to keep the infection from spreading. Follow your health care provider's instructions and use these guidelines to help stop the spread of infection. How infections are spread In order for an infection to spread, the following must be present:  A germ. This may be a virus, bacteria, fungus, or parasite.  A place for the germ to live. This may be: ? On or in a person, animal, plant, or food. ? In soil or water. ? On surfaces, such as a door handle.  A person or animal who can develop a disease if the germ enters the body (host). The host does not have resistance to the germ.  A way for the germ to enter the host. This may occur by: ? Direct contact with an infected person or animal. This can happen through shaking hands or hugging. Some germs can also travel through the air and spread to others. This can happen when an infected person coughs or sneezes on or near other people. ? Indirect contact. This occurs when the germ enters the host through contact with an infected object. Examples include:  Eating or drinking food or water that has the germ (is contaminated).  Touching a contaminated surface with your hands, and then touching your face, eyes, nose, or mouth. Supplies needed:  Soap.  Alcohol-based hand sanitizer.  Standard cleaning products.  Disinfectants, such as bleach.  Reusable cleaning cloths, sponges, or paper towels.  Disposable or reusable utility gloves. How to prevent infection from spreading There are several things that you can do to help prevent infection from spreading. Take these general actions Everyone should take the following actions to prevent the spread of infection:  Wash your hands often with soap and water for at least 20 seconds. If soap and water are not available, use  alcohol-based hand sanitizer.  Avoid touching your face, mouth, nose, or eyes.  Cough or sneeze into a tissue, sleeve, or elbow instead of into your hand or into the air. ? If you cough or sneeze into a tissue, throw it away immediately and wash your hands.  Keep your bathroom clean  Provide soap.  Change towels and washcloths frequently.  Change toothbrushes often and store them separately in a clean, dry place.  Clean and disinfect all surfaces, including the toilet, floor, tub, shower, and sink.  Do not share personal items, such as razors, toothbrushes, deodorant, combs, brushes, towels, and washcloths. Maintain hygiene in the [facility] your hands before and after preparing food and before you eat.  Clean the inside of your refrigerator each week.  Keep your refrigerator set at 23F (4C) or less, and set your freezer at 50F (-18C) or less.  Keep work surfaces clean. Disinfect them regularly.  Wash your dishes in hot, soapy water. Air-dry your dishes or use a dishwasher.  Do not share dishes or eating utensils. Handle food safely  Store food carefully.  Refrigerate leftovers promptly in covered containers.  Throw out stale or spoiled food.  Thaw foods in the refrigerator or microwave, not at room temperature.  Serve foods at the proper temperature. Do not eat raw meat. Make sure it is cooked to the appropriate temperature. Cook eggs until they are firm.  Wash fruits and vegetables under running water.  Use separate cutting boards, plates, and utensils  for raw foods and cooked foods.  Use a clean spoon each time you sample food while cooking. Do laundry the right way  Wear gloves if laundry is visibly soiled.  Do not shake soiled laundry. Doing that may send germs into the air.  Wash laundry in hot water.  If you cannot wash the laundry right away, place it in a plastic bag and wash it as soon as possible. Be careful around animals and pets  Wash  your hands before and after touching animals.  If you have a pet, ensure that your pet stays clean. Do not let people with weak immune systems touch bird droppings, fish tank water, or a litter box. ? If you have a pet cage or litter box, be sure to clean it every day.  If you are sick, stay away from animals and have someone else care for them if possible. How to clean and disinfect objects and surfaces Precautions  Some disinfectants work for certain germs and not others. Read the manufacturer's instructions or read online resources to determine if the product you are using will work for the germ you are trying to remove.  If you choose to use bleach, use it safely. Never mix it with other cleaning products, especially those that contain ammonia. This mixture can create a dangerous gas that may be deadly.  Keep proper movement of fresh air in your home (ventilation).  Pour used mop water down the utility sink or toilet. Do not pour this water down the kitchen sink. Objects and surfaces   If surfaces are visibly soiled, clean them first with soap and water before disinfecting.  Disinfect surfaces that are frequently touched every day. This may include: ? Counters. ? Tables. ? Doorknobs. ? Sinks and faucets. ? Electronics, such as:  Architectural technologist.  Remote controls.  Keyboards.  Computers and tablets. Cleaning supplies Some cleaning supplies can breed germs. Take good care of them to prevent germs from spreading. To do this:  Soak toilet brushes, mops, and sponges in bleach and water for 5 minutes after each use, or according to manufacturer's instructions.  Wash reusable cleaning cloths and sanitize sponges after each use.  Throw away disposable gloves after one use.  Replace reusable utility gloves if they are cracked or torn or if they start to peel. Additional actions if you are sick If you live with other people:   Avoid close contact with those around you. Stay at least  3 ft (1 m) away from others, if possible.  Use a separate bathroom, if possible.  If possible, sleep in a separate bedroom or in a separate bed to prevent infecting other household members. ? Change bedroom linens each week or whenever they are soiled.  Have everyone in the household wash hands often with soap and water. If soap and water are not available, use alcohol-based hand sanitizer. In general:  Stay home except to get medical care. Call ahead before visiting your health care provider.  Ask others to get groceries and household supplies and to refill prescriptions for you.  Avoid public areas. Try not to take public transportation.  If you can, wear a mask if you need to go out of the house, or if you are in close contact with someone who is not sick.  Avoid visitors until you have completely recovered, or until you have no signs and symptoms of infection.  Avoid preparing food or providing care for others. If you must prepare food or provide care  for others, wear a mask and wash your hands before and after doing these things. Where to find more information  Centers for Disease Control and Prevention: PromotionalReview.nl  World Health Organization [facility]): PrintStats.tn  Association for Professionals in Infection Control and Epidemiology: professionals.site.StadiumBlog.se Summary  It is important to know how to keep the infection from spreading.  Make sure everyone in your household washes their hands often with soap and water.  Disinfect surfaces that are frequently touched every day.  If you are sick, stay home except to get medical care. This information is not intended to replace advice given to you by your health care provider. Make sure you discuss any questions you have with your health care provider. Document Released: 10/16/2007 Document Revised: 05/04/2018  Document Reviewed: 04/02/2018 Elsevier Patient Education  [place].

## 2019-04-06 NOTE — Progress Notes (Signed)
This visit occurred during the SARS-CoV-2 public health emergency.  Safety protocols were in place, including screening questions prior to the visit, additional usage of staff PPE, and extensive cleaning of exam room while observing appropriate contact time as indicated for disinfecting solutions.  Subjective:   Anna Clay is a 69 y.o. female who presents for Medicare Annual (Subsequent) preventive examination.  Review of Systems:  n/a Cardiac Risk Factors include: advanced age (>51mn, >>37women);diabetes mellitus;hypertension;obesity (BMI >30kg/m2);sedentary lifestyle     Objective:     Vitals: BP 138/78 (BP Location: Left Arm, Patient Position: Sitting, Cuff Size: Large)   Pulse 98   Temp 98.2 F (36.8 C) (Oral)   Ht 5' 3"  (1.6 m)   Wt 246 lb 9.6 oz (111.9 kg)   SpO2 92%   BMI 43.68 kg/m   Body mass index is 43.68 kg/m.  Advanced Directives 04/06/2019 05/25/2018 02/17/2018 12/05/2016 11/10/2016 10/24/2016 12/28/2013  Does Patient Have a Medical Advance Directive? No No No No No No No  Would patient like information on creating a medical advance directive? Yes (MAU/Ambulatory/Procedural Areas - Information given) No - Patient declined Yes (MAU/Ambulatory/Procedural Areas - Information given) No - Patient declined No - Patient declined Yes (MAU/Ambulatory/Procedural Areas - Information given) Yes - Educational materials given    Tobacco Social History   Tobacco Use  Smoking Status Former Smoker  . Packs/day: 1.00  . Years: 43.00  . Pack years: 43.00  . Types: Cigarettes  . Quit date: 09/20/2013  . Years since quitting: 5.5  Smokeless Tobacco Never Used     Counseling given: Not Answered   Clinical Intake:  Pre-visit preparation completed: Yes  Pain : 0-10 Pain Score: 4  Pain Type: Chronic pain Pain Location: Hip Pain Orientation: Left, Right Pain Descriptors / Indicators: Aching, Sharp, Dull Pain Onset: More than a month ago Pain Frequency: Constant Pain  Relieving Factors: APAP eases it up  Pain Relieving Factors: APAP eases it up  Nutritional Status: BMI > 30  Obese Nutritional Risks: None Diabetes: Yes  How often do you need to have someone help you when you read instructions, pamphlets, or other written materials from your doctor or pharmacy?: 1 - Never What is the last grade level you completed in school?: three years college  Interpreter Needed?: No  Information entered by :: NAllen LPN  Past Medical History:  Diagnosis Date  . Chest pain   . Diabetes mellitus with kidney disease ([facility]   . Diastolic heart failure ([facility]   . Gout   . Hypercholesteremia   . Hypertension   . Obesity   . OSA (obstructive sleep apnea)   . Oxygen deficiency    Past Surgical History:  Procedure Laterality Date  . ABDOMINAL HYSTERECTOMY    . UVULOPALATOPHARYNGOPLASTY (UPPP)/TONSILLECTOMY/SEPTOPLASTY  01/25/2008   By JMinna Merritts  Family History  Problem Relation Age of Onset  . Heart disease Father   . Heart attack Father   . Stroke Father   . Heart disease Sister   . Cancer - Lung Sister   . Sickle cell trait Other        all family members  . Cancer Brother        bone  . Diabetes Brother   . Diabetes Sister   . Thyroid disease Mother   . Goiter Mother    Social History   Socioeconomic History  . Marital status: Widowed    Spouse name: Not on file  . Number of children: 1  .  Years of education: Not on file  . Highest education level: Not on file  Occupational History  . Occupation: retired  Tobacco Use  . Smoking status: Former Smoker    Packs/day: 1.00    Years: 43.00    Pack years: 43.00    Types: Cigarettes    Quit date: 09/20/2013    Years since quitting: 5.5  . Smokeless tobacco: Never Used  Substance and Sexual Activity  . Alcohol use: Not Currently    Alcohol/week: 0.0 standard drinks  . Drug use: No  . Sexual activity: Not Currently  Other Topics Concern  . Not on file  Social History Narrative  . Not  on file   Social Determinants of Health   Financial Resource Strain: Low Risk   . Difficulty of Paying Living Expenses: Not hard at all  Food Insecurity: No Food Insecurity  . Worried About Charity fundraiser in the Last Year: Never true  . Ran Out of Food in the Last Year: Never true  Transportation Needs: No Transportation Needs  . Lack of Transportation (Medical): No  . Lack of Transportation (Non-Medical): No  Physical Activity: Inactive  . Days of Exercise per Week: 0 days  . Minutes of Exercise per Session: 0 min  Stress: No Stress Concern Present  . Feeling of Stress : Not at all  Social Connections: Slightly Isolated  . Frequency of Communication with Friends and Family: More than three times a week  . Frequency of Social Gatherings with Friends and Family: More than three times a week  . Attends Religious Services: More than 4 times per year  . Active Member of Clubs or Organizations: Patient refused  . Attends Archivist Meetings: More than 4 times per year  . Marital Status: Widowed    Outpatient Encounter Medications as of 04/06/2019  Medication Sig  . Accu-Chek FastClix Lancets MISC USE AS DIRECTED TO CHECK BLOOD SUGARS 2 TIMES PER DAY DX:E11.65  . acetaminophen (TYLENOL) 650 MG CR tablet Take 1,300 mg by mouth every 8 (eight) hours as needed for pain.  Marland Kitchen allopurinol (ZYLOPRIM) 100 MG tablet TAKE ONE TABLET BY MOUTH DAILY  . amLODipine (NORVASC) 5 MG tablet TAKE ONE TABLET BY MOUTH DAILY  . aspirin EC 81 MG tablet Take 81 mg by mouth daily.  . calcium carbonate (TUMS - DOSED IN MG ELEMENTAL CALCIUM) 500 MG chewable tablet Chew 1 tablet by mouth daily. 2 per day  . Cholecalciferol (VITAMIN D) 2000 UNITS CAPS Take 1 capsule by mouth daily.  . colchicine 0.6 MG tablet Take 1 tablet (0.6 mg total) by mouth daily.  . ferrous sulfate 325 (65 FE) MG EC tablet Take 325 mg by mouth daily.  . Flaxseed, Linseed, (FLAXSEED OIL MAX STR) 1300 MG CAPS Take 1 capsule by  mouth daily.  . folic acid (FOLVITE) 1 MG tablet TAKE ONE TABLET BY MOUTH DAILY  . glucose blood (CONTOUR NEXT TEST) test strip Use as instructed to check blood sugars 2 times per day dx: e11.22  . HUMALOG KWIKPEN 200 UNIT/ML SOPN INJECT 6 UNITS BY SUBCUTANEOUS ROUTE  AT BREAKFAST, 8 UNITS AT LUNCH AND 10 UNITS AT DINNER  . metoprolol succinate (TOPROL-XL) 25 MG 24 hr tablet TAKE ONE TABLET BY MOUTH DAILY  . Multiple Vitamin (MULTIVITAMIN WITH MINERALS) TABS tablet Take 1 tablet by mouth daily.  . nitroGLYCERIN (NITROSTAT) 0.4 MG SL tablet Place 0.4 mg under the tongue every 5 (five) minutes as needed for chest pain.  Marland Kitchen  NOVOFINE 32G X 6 MM MISC USE 5 TO 8 NEEDLES PER DAY AS DIRECTED PER INSULIN PROTOCOL  . Pitavastatin Calcium 4 MG TABS Take 4 mg by mouth daily.  . Potassium Gluconate 550 MG TABS Take 1 tablet by mouth daily.  . ranitidine (ZANTAC) 150 MG tablet Take 150 mg by mouth daily as needed for heartburn.  . spironolactone (ALDACTONE) 25 MG tablet TAKE ONE TABLET BY MOUTH DAILY  . torsemide (DEMADEX) 20 MG tablet TAKE ONE TABLET BY MOUTH DAILY  . TOUJEO SOLOSTAR 300 UNIT/ML SOPN INJECT 62 UNITS SUBCUTANEOUSLY EVERY NIGHT AT BEDTIME (Patient taking differently: 58 Units. )   No facility-administered encounter medications on file as of 04/06/2019.    Activities of Daily Living In your present state of health, do you have any difficulty performing the following activities: 04/06/2019  Hearing? N  Vision? N  Difficulty concentrating or making decisions? N  Walking or climbing stairs? N  Dressing or bathing? N  Doing errands, shopping? N  Preparing Food and eating ? N  Using the Toilet? N  In the past six months, have you accidently leaked urine? N  Do you have problems with loss of bowel control? N  Managing your Medications? N  Managing your Finances? N  Housekeeping or managing your Housekeeping? N  Some recent data might be hidden    Patient Care Team: Glendale Chard, MD as  PCP - General (Internal Medicine) Daneen Schick as Social Worker [place], Claudette Stapler, RN as Case Manager Lavera Guise, [facility] (Pharmacist)    Assessment:   This is a routine wellness examination for Dorissa.  Exercise Activities and Dietary recommendations Current Exercise Habits: The patient does not participate in regular exercise at present  Goals    . "I am having some low blood sugars...as low as in the 50's" (pt-stated)     Current Barriers:  . Chronic Disease Management support and education needs related to Diabetes  Nurse Case Manager Clinical Goal(s):  Marland Kitchen Over the next 90 days, patient will work with the CCM team to address needs related to Diabetes disease management   CCM RN CM Interventions:  12/21/18 call completed with patient  . Evaluation of current treatment plan related to Diabetes and patient's adherence to plan as established by provider. . Advised patient to eat a healthy snack at bedtime, such as 1/2 plain peanut butter sandwich to help avoid hypoglycemic event during nighttime hours . Provided education to patient re: increase in A1C from 7.3 to 8.0 obtained on 08/18/18; Reviewed meal planning and discussed typical meals for patient; education provided of importance of implementing daily exercise routine, 150 minutes per week, minimal is recommended by ADA - pt is following a daily HEP currently, discussed this will lower CBG's . Reviewed medications with patient and discussed patient is taking Toujeo 62 units at bedtime as directed and Humalog insulin SS but she has not needed . Collaborated with embedded Pharm D Lottie Dawson regarding outreach needed to patient to review Toujeo regimen due to having multiple nighttime hypoglycemic events with CBG in the 50's . Discussed plans with patient for ongoing care management follow up and provided patient with direct contact information for care management team . Provided patient with printed  educational materials related to  Diabetes Zone Safety Tool, Diabetes Meal Planning, Carb Choices, Carb Counting, s/s hypo/hyperglycemia, Know Your A1C . Advised patient, providing education and rationale, to check cbg daily before meals and record, calling the CCM team and or PCP for  findings outside established parameters.   . Discussed goal daily glycemic control is 80-130 . Rescheduled DM follow up with Dr. Baird Cancer for next afternoon available per patient's request, 12/29/18 @4 :00 PM   Patient Self Care Activities:  . Self administers medications as prescribed . Attends all scheduled provider appointments . Calls pharmacy for medication refills . Performs ADL's independently . Performs IADL's independently . Calls provider office for new concerns or questions   Initial goal documentation     . "I am having some shoulder pain" (pt-stated)     Current Barriers:  Marland Kitchen Knowledge Deficits related to diagnosis and treatment of left shoulder pain   Nurse Case Manager Clinical Goal(s):  Marland Kitchen Over the next 30 days, patient will work with PCP to address needs related to left shoulder pain and decreased ROM   CCM RN CM Interventions:  12/21/18 call completed with patient  . Evaluation of current treatment plan related to left shoulder pain and patient's adherence to plan as established by provider. . Advised patient to notify Dr. Baird Cancer of her left shoulder pain and decreased ROM . Discussed plans with patient for ongoing care management follow up and provided patient with direct contact information for care management team . Reviewed scheduled/upcoming provider appointments including: DM follow visit scheduled with Dr. Baird Cancer for 12/29/18 @4  PM   Patient Self Care Activities:  . Self administers medications as prescribed . Attends all scheduled provider appointments . Calls pharmacy for medication refills . Performs ADL's independently . Performs IADL's independently . Calls provider office for new concerns or  questions  Initial goal documentation     . "I need help getting my CPAP supplies" (pt-stated)     Current Barriers:  . Inability to purchase new CPAP supplies due to need for new MD Rx  Nurse Case Manager Clinical Goal(s):  Marland Kitchen Over the next 30 days, patient will verbalize understanding of plan for order and purchase of Respro CPAP supplies. Goal Met . 07/27/18 Over the next 30 days, patient will have requested and received a reservoir replacement for her CPAP machine   CCM RN CM Interventions:  07/27/18: completed call with patient  . Evaluation of current treatment plan related to indication and usage of CPAP and patient's adherence to plan as established by provider - pt reports adherence with using her CPAP as directed . Confirmed patient received her CPAP supplies; discussed patient will need her reservoir replaced and will call [facility] [place] to make the request . Encouraged patient to contact the CCM team and or Dr. Baird Cancer if further assistance is needed   . Discussed plans with patient for ongoing care management follow up and provided patient with direct contact information for care management team   Patient Self Care Activities:  . Self administers medications as prescribed . Attends all scheduled provider appointments . Calls pharmacy for medication refills . Attends church or other social activities . Performs ADL's independently . Performs IADL's independently . Calls provider office for new concerns or questions  Please see past updates related to this goal by clicking on the "Past Updates" button in the selected goal       . "I would appreciate learning more about how to meal plan" (pt-stated)     Current Barriers:  Marland Kitchen Knowledge Deficits related to Diabetes disease process and Self Health management  Nurse Case Manager Clinical Goal(s):  Marland Kitchen Over the next 90 days, patient will work with CCM RN CM  to address needs related to meal planning  and basic disease process for  DMII.   goal date re-established due to [place] treatment delays  CCM RN CM Interventions:  Completed on 06/16/18: completed call with patient  . Evaluation of current treatment plan related to Diabetes disease management  and patient's adherence to plan as established by provider. . Provided education to patient re: basic disease process related diabetes; discussed Meal planning using the plate method; discussed current A1C and discussed target A1C . Reviewed medications with patient and discussed importance of taking prescribed medications exactly as prescribed for best effectiveness . Discussed plans with patient for ongoing care management follow up and provided patient with direct contact information for care management team . Provided patient with printed  educational materials related to Meal Planning, Know Your A1C, Signs and Symptoms of Hypo/Hypoglycemia . Advised patient, providing education and rationale, to check cbg daily before meals  and record, calling RN CM and or Dr. Baird Cancer for findings outside established parameters.    . Provided RNCM contact # and discussed nurse availability . Scheduled a CCM follow-up call with patient for 1-2 weeks  Patient Self Care Activities:  . Self administers medications as prescribed . Attends all scheduled provider appointments . Calls pharmacy for medication refills . Attends church or other social activities . Performs ADL's independently . Performs IADL's independently . Calls provider office for new concerns or questions  Initial goal documentation      . I would like to manage my diabetes (pt-stated)     Current Barriers:  . Diabetes: P3IR; complicated by chronic medical conditions including HTN, HLD, most recent A1c 7% ON 12/29/18 . Current antihyperglycemic regimen: Toujeo 58 units qHS, Humalog meal time 6 units breakfast, 8 units lunch, 10 units with dinner o Patient's Bgs range 80-100 o Denies hypoglycemia (Bg<70) o Reports one  incident where BG was 74 (due to no carbohydrate dinner and light lunch) o Continue current medication regimen as above . Reports hypoglycemic symptoms, including dizziness, lightheadedness, shaking, sweating (3 weeks ago) . Denies hyperglycemic symptoms . Current meal patterns: reports eating better, reducing carbohydrate intake.  Her son is helping to prepare meals, etc . Current exercise: reports light walking . Cardiovascular risk reduction: o Current hypertensive regimen: amlodipine, metoprolol. spironolactone o Current hyperlipidemia regimen: pitivastatin - LDL on 4/29 was 100 o Current antiplatelet regimen: n/a  Pharmacist Clinical Goal(s):  Marland Kitchen Over the next 90 days, patient will work with PharmD and primary care provider to address needs related to optimization of medication management of chronic conditions  Interventions: . Comprehensive medication review performed, medication list updated in electronic medical record . Reviewed & discussed the following diabetes-related information with patient: o Follow ADA recommended "diabetes-friendly" diet  (reviewed healthy snack/food options) o Discussed insulin injection technique o Reviewed medication purpose/side effects-->counseled on signs/symptoms of hypoglycemia  Patient Self Care Activities:  . Patient will check blood glucose 3-4 times daily as directed , document, and provide at future appointments . Patient will focus on medication adherence by continuing to take medications as prescribed . Patient will take medications as prescribed . Patient will contact provider with any episodes of hypoglycemia . Patient will report any questions or concerns to provider   Initial goal documentation     . Patient Stated (pt-stated)     No goals    . Patient Stated     04/06/2019, no goals       Fall Risk Fall Risk  04/06/2019 08/18/2018 05/19/2018 02/17/2018 08/20/2017  Falls in the past year?  0 0 0 0 No  Comment - - - - Emmi  Telephone Survey: data to providers prior to load  Risk for fall due to : Impaired balance/gait;Medication side effect - - Medication side effect -  Follow up Falls evaluation completed;Education provided;Falls prevention discussed - - Education provided;Falls prevention discussed -   Is the patient's home free of loose throw rugs in walkways, pet beds, electrical cords, etc?   yes      Grab bars in the bathroom? no      Handrails on the stairs?   yes      Adequate lighting?   yes  Timed Get Up and Go performed: n/a  Depression Screen PHQ 2/9 Scores 04/06/2019 05/25/2018 05/19/2018 02/17/2018  PHQ - 2 Score 0 0 0 0  PHQ- 9 Score 0 - - 5     Cognitive Function     6CIT Screen 04/06/2019 02/17/2018  What Year? 0 points 0 points  What month? 0 points 0 points  What time? 0 points 0 points  Count back from 20 0 points 0 points  Months in reverse 0 points 0 points  Repeat phrase 0 points 0 points  Total Score 0 0    Immunization History  Administered Date(s) Administered  . Influenza, High Dose Seasonal PF 01/27/2018  . Influenza,inj,Quad PF,6+ Mos 10/06/2013, 11/06/2018  . Pneumococcal Conjugate-13 01/27/2018  . Pneumococcal Polysaccharide-23 10/06/2013  . Tdap 11/06/2018    Qualifies for Shingles Vaccine? yes  Screening Tests Health Maintenance  Topic Date Due  . OPHTHALMOLOGY EXAM  Never done  . DEXA SCAN  Never done  . PNA vac Low Risk Adult (2 of 2 - PPSV23) 01/28/2019  . FOOT EXAM  05/19/2019  . HEMOGLOBIN A1C  06/29/2019  . URINE MICROALBUMIN  04/05/2020  . MAMMOGRAM  10/11/2020  . COLONOSCOPY  08/29/2025  . TETANUS/TDAP  11/05/2028  . INFLUENZA VACCINE  Completed  . Hepatitis C Screening  Completed    Cancer Screenings: Lung: Low Dose CT Chest recommended if Age 71-80 years, 30 pack-year currently smoking OR have quit w/in 15years. Patient does not qualify. Breast:  Up to date on Mammogram? Yes   Up to date of Bone Density/Dexa? Yes Colorectal: up to  date  Additional Screenings: : Hepatitis C Screening: 03/28/2015     Plan:    Patient has no goals set at this time.   I have personally reviewed and noted the following in the patient's chart:   . Medical and social history . Use of alcohol, tobacco or illicit drugs  . Current medications and supplements . Functional ability and status . Nutritional status . Physical activity . Advanced directives . List of other physicians . Hospitalizations, surgeries, and ER visits in previous 12 months . Vitals . Screenings to include cognitive, depression, and falls . Referrals and appointments  In addition, I have reviewed and discussed with patient certain preventive protocols, quality metrics, and best practice recommendations. A written personalized care plan for preventive services as well as general preventive health recommendations were provided to patient.     Kellie Simmering, LPN  [id number]

## 2019-04-07 LAB — CMP14+EGFR
ALT: 16 IU/L (ref 0–32)
AST: 18 IU/L (ref 0–40)
Albumin/Globulin Ratio: 1.1 — ABNORMAL LOW (ref 1.2–2.2)
Albumin: 3.8 g/dL (ref 3.8–4.8)
Alkaline Phosphatase: 110 IU/L (ref 39–117)
BUN/Creatinine Ratio: 20 (ref 12–28)
BUN: 27 mg/dL (ref 8–27)
Bilirubin Total: 0.2 mg/dL (ref 0.0–1.2)
CO2: 26 mmol/L (ref 20–29)
Calcium: 10 mg/dL (ref 8.7–10.3)
Chloride: 99 mmol/L (ref 96–106)
Creatinine, Ser: 1.37 mg/dL — ABNORMAL HIGH (ref 0.57–1.00)
GFR calc Af Amer: 46 mL/min/1.73 — ABNORMAL LOW
GFR calc non Af Amer: 40 mL/min/1.73 — ABNORMAL LOW
Globulin, Total: 3.5 g/dL (ref 1.5–4.5)
Glucose: 70 mg/dL (ref 65–99)
Potassium: 4.6 mmol/L (ref 3.5–5.2)
Sodium: 139 mmol/L (ref 134–144)
Total Protein: 7.3 g/dL (ref 6.0–8.5)

## 2019-04-07 LAB — LIPID PANEL
Chol/HDL Ratio: 3.9 ratio (ref 0.0–4.4)
Cholesterol, Total: 176 mg/dL (ref 100–199)
HDL: 45 mg/dL (ref >39)
LDL Chol Calc (NIH): 110 mg/dL — ABNORMAL HIGH (ref 0–99)
Triglycerides: 117 mg/dL (ref 0–149)
VLDL Cholesterol Cal: 21 mg/dL (ref 5–40)

## 2019-04-07 LAB — HEMOGLOBIN A1C
Est. average glucose Bld gHb Est-mCnc: 151 mg/dL
Hgb A1c MFr Bld: 6.9 % — ABNORMAL HIGH (ref 4.8–5.6)

## 2019-04-07 LAB — URIC ACID: Uric Acid: 10.3 mg/dL — ABNORMAL HIGH (ref 3.0–7.2)

## 2019-04-17 ENCOUNTER — Other Ambulatory Visit: Payer: Self-pay | Admitting: Internal Medicine

## 2019-04-20 ENCOUNTER — Telehealth: Payer: Self-pay

## 2019-04-21 DIAGNOSIS — E2839 Other primary ovarian failure: Secondary | ICD-10-CM | POA: Diagnosis not present

## 2019-04-21 LAB — HM DEXA SCAN

## 2019-04-25 DIAGNOSIS — M5136 Other intervertebral disc degeneration, lumbar region: Secondary | ICD-10-CM | POA: Diagnosis not present

## 2019-04-25 DIAGNOSIS — M1612 Unilateral primary osteoarthritis, left hip: Secondary | ICD-10-CM | POA: Diagnosis not present

## 2019-04-25 DIAGNOSIS — M25552 Pain in left hip: Secondary | ICD-10-CM | POA: Diagnosis not present

## 2019-04-25 DIAGNOSIS — M4726 Other spondylosis with radiculopathy, lumbar region: Secondary | ICD-10-CM | POA: Diagnosis not present

## 2019-04-25 DIAGNOSIS — M4326 Fusion of spine, lumbar region: Secondary | ICD-10-CM | POA: Diagnosis not present

## 2019-04-25 DIAGNOSIS — M47816 Spondylosis without myelopathy or radiculopathy, lumbar region: Secondary | ICD-10-CM | POA: Diagnosis not present

## 2019-04-26 ENCOUNTER — Telehealth: Payer: Self-pay

## 2019-05-03 ENCOUNTER — Telehealth: Payer: Self-pay

## 2019-05-09 ENCOUNTER — Other Ambulatory Visit: Payer: Self-pay | Admitting: Cardiology

## 2019-05-09 ENCOUNTER — Other Ambulatory Visit: Payer: Self-pay | Admitting: Internal Medicine

## 2019-05-09 DIAGNOSIS — H35342 Macular cyst, hole, or pseudohole, left eye: Secondary | ICD-10-CM | POA: Diagnosis not present

## 2019-05-09 DIAGNOSIS — Z7984 Long term (current) use of oral hypoglycemic drugs: Secondary | ICD-10-CM | POA: Diagnosis not present

## 2019-05-09 DIAGNOSIS — H2511 Age-related nuclear cataract, right eye: Secondary | ICD-10-CM | POA: Diagnosis not present

## 2019-05-09 DIAGNOSIS — E119 Type 2 diabetes mellitus without complications: Secondary | ICD-10-CM | POA: Diagnosis not present

## 2019-05-09 DIAGNOSIS — Z961 Presence of intraocular lens: Secondary | ICD-10-CM | POA: Diagnosis not present

## 2019-05-10 DIAGNOSIS — Z23 Encounter for immunization: Secondary | ICD-10-CM | POA: Diagnosis not present

## 2019-05-13 ENCOUNTER — Other Ambulatory Visit: Payer: Self-pay

## 2019-05-13 MED ORDER — TORSEMIDE 20 MG PO TABS: 20.0000 mg | ORAL_TABLET | Freq: Every day | ORAL | 0 refills | Status: DC

## 2019-05-17 DIAGNOSIS — M47816 Spondylosis without myelopathy or radiculopathy, lumbar region: Secondary | ICD-10-CM | POA: Diagnosis not present

## 2019-05-17 DIAGNOSIS — M5136 Other intervertebral disc degeneration, lumbar region: Secondary | ICD-10-CM | POA: Diagnosis not present

## 2019-05-20 ENCOUNTER — Telehealth: Payer: Self-pay

## 2019-05-25 ENCOUNTER — Encounter: Payer: Self-pay | Admitting: Internal Medicine

## 2019-05-25 ENCOUNTER — Other Ambulatory Visit: Payer: Self-pay | Admitting: Internal Medicine

## 2019-05-26 ENCOUNTER — Telehealth: Payer: Self-pay | Admitting: Internal Medicine

## 2019-05-26 ENCOUNTER — Encounter: Payer: Self-pay | Admitting: Internal Medicine

## 2019-05-26 MED ORDER — METOPROLOL SUCCINATE ER 25 MG PO TB24: 25.0000 mg | ORAL_TABLET | Freq: Every day | ORAL | 0 refills | Status: DC

## 2019-05-26 NOTE — Chronic Care Management (AMB) (Signed)
  Care Management   Note  05/26/2019 Name: [name] MRN: [id number] DOB: July 07, 1950  Anna Clay is a 69 y.o. year old female who is a primary care patient of Glendale Chard, MD and is actively engaged with the care management team. I reached out to Anna Clay by phone today to assist with scheduling an initial visit with the Pharmacist  Follow up plan: Telephone appointment with care management team member scheduled for:06/16/2019.  [place], [place] [address] Direct Dial: [phone] [email or web address]

## 2019-06-01 DIAGNOSIS — M5416 Radiculopathy, lumbar region: Secondary | ICD-10-CM | POA: Diagnosis not present

## 2019-06-01 DIAGNOSIS — J9601 Acute respiratory failure with hypoxia: Secondary | ICD-10-CM | POA: Diagnosis not present

## 2019-06-01 DIAGNOSIS — M4316 Spondylolisthesis, lumbar region: Secondary | ICD-10-CM | POA: Diagnosis not present

## 2019-06-01 DIAGNOSIS — J159 Unspecified bacterial pneumonia: Secondary | ICD-10-CM | POA: Diagnosis not present

## 2019-06-01 DIAGNOSIS — S0990XA Unspecified injury of head, initial encounter: Secondary | ICD-10-CM | POA: Diagnosis not present

## 2019-06-01 DIAGNOSIS — I130 Hypertensive heart and chronic kidney disease with heart failure and stage 1 through stage 4 chronic kidney disease, or unspecified chronic kidney disease: Secondary | ICD-10-CM | POA: Diagnosis not present

## 2019-06-01 DIAGNOSIS — M5127 Other intervertebral disc displacement, lumbosacral region: Secondary | ICD-10-CM | POA: Diagnosis not present

## 2019-06-01 DIAGNOSIS — Z6841 Body Mass Index (BMI) 40.0 and over, adult: Secondary | ICD-10-CM | POA: Diagnosis not present

## 2019-06-01 DIAGNOSIS — W19XXXA Unspecified fall, initial encounter: Secondary | ICD-10-CM | POA: Diagnosis not present

## 2019-06-01 DIAGNOSIS — M47816 Spondylosis without myelopathy or radiculopathy, lumbar region: Secondary | ICD-10-CM | POA: Diagnosis not present

## 2019-06-01 DIAGNOSIS — Z8709 Personal history of other diseases of the respiratory system: Secondary | ICD-10-CM | POA: Diagnosis not present

## 2019-06-01 DIAGNOSIS — M5125 Other intervertebral disc displacement, thoracolumbar region: Secondary | ICD-10-CM | POA: Diagnosis not present

## 2019-06-01 DIAGNOSIS — G8929 Other chronic pain: Secondary | ICD-10-CM | POA: Diagnosis not present

## 2019-06-01 DIAGNOSIS — M5126 Other intervertebral disc displacement, lumbar region: Secondary | ICD-10-CM | POA: Diagnosis not present

## 2019-06-01 DIAGNOSIS — R7989 Other specified abnormal findings of blood chemistry: Secondary | ICD-10-CM | POA: Diagnosis not present

## 2019-06-01 DIAGNOSIS — M5136 Other intervertebral disc degeneration, lumbar region: Secondary | ICD-10-CM | POA: Diagnosis not present

## 2019-06-01 DIAGNOSIS — I5032 Chronic diastolic (congestive) heart failure: Secondary | ICD-10-CM | POA: Diagnosis not present

## 2019-06-01 DIAGNOSIS — Z20822 Contact with and (suspected) exposure to covid-19: Secondary | ICD-10-CM | POA: Diagnosis not present

## 2019-06-01 DIAGNOSIS — R0902 Hypoxemia: Secondary | ICD-10-CM | POA: Diagnosis not present

## 2019-06-01 DIAGNOSIS — M5489 Other dorsalgia: Secondary | ICD-10-CM | POA: Diagnosis not present

## 2019-06-01 DIAGNOSIS — E119 Type 2 diabetes mellitus without complications: Secondary | ICD-10-CM | POA: Diagnosis not present

## 2019-06-01 DIAGNOSIS — I10 Essential (primary) hypertension: Secondary | ICD-10-CM | POA: Diagnosis not present

## 2019-06-01 DIAGNOSIS — R0602 Shortness of breath: Secondary | ICD-10-CM | POA: Diagnosis not present

## 2019-06-01 DIAGNOSIS — M48061 Spinal stenosis, lumbar region without neurogenic claudication: Secondary | ICD-10-CM | POA: Diagnosis not present

## 2019-06-02 DIAGNOSIS — M5136 Other intervertebral disc degeneration, lumbar region: Secondary | ICD-10-CM | POA: Diagnosis present

## 2019-06-02 DIAGNOSIS — J9601 Acute respiratory failure with hypoxia: Secondary | ICD-10-CM | POA: Diagnosis present

## 2019-06-02 DIAGNOSIS — I272 Pulmonary hypertension, unspecified: Secondary | ICD-10-CM | POA: Diagnosis present

## 2019-06-02 DIAGNOSIS — I517 Cardiomegaly: Secondary | ICD-10-CM | POA: Diagnosis not present

## 2019-06-02 DIAGNOSIS — M48061 Spinal stenosis, lumbar region without neurogenic claudication: Secondary | ICD-10-CM | POA: Diagnosis present

## 2019-06-02 DIAGNOSIS — I080 Rheumatic disorders of both mitral and aortic valves: Secondary | ICD-10-CM | POA: Diagnosis not present

## 2019-06-02 DIAGNOSIS — M48062 Spinal stenosis, lumbar region with neurogenic claudication: Secondary | ICD-10-CM | POA: Diagnosis not present

## 2019-06-02 DIAGNOSIS — I5032 Chronic diastolic (congestive) heart failure: Secondary | ICD-10-CM | POA: Diagnosis present

## 2019-06-02 DIAGNOSIS — Z6841 Body Mass Index (BMI) 40.0 and over, adult: Secondary | ICD-10-CM | POA: Diagnosis not present

## 2019-06-02 DIAGNOSIS — K5900 Constipation, unspecified: Secondary | ICD-10-CM | POA: Diagnosis present

## 2019-06-02 DIAGNOSIS — M109 Gout, unspecified: Secondary | ICD-10-CM | POA: Diagnosis present

## 2019-06-02 DIAGNOSIS — M5442 Lumbago with sciatica, left side: Secondary | ICD-10-CM | POA: Diagnosis not present

## 2019-06-02 DIAGNOSIS — G4733 Obstructive sleep apnea (adult) (pediatric): Secondary | ICD-10-CM | POA: Diagnosis present

## 2019-06-02 DIAGNOSIS — F17210 Nicotine dependence, cigarettes, uncomplicated: Secondary | ICD-10-CM | POA: Diagnosis present

## 2019-06-02 DIAGNOSIS — E1122 Type 2 diabetes mellitus with diabetic chronic kidney disease: Secondary | ICD-10-CM | POA: Diagnosis present

## 2019-06-02 DIAGNOSIS — Z20822 Contact with and (suspected) exposure to covid-19: Secondary | ICD-10-CM | POA: Diagnosis present

## 2019-06-02 DIAGNOSIS — Z720 Tobacco use: Secondary | ICD-10-CM | POA: Diagnosis not present

## 2019-06-02 DIAGNOSIS — Z794 Long term (current) use of insulin: Secondary | ICD-10-CM | POA: Diagnosis not present

## 2019-06-02 DIAGNOSIS — I130 Hypertensive heart and chronic kidney disease with heart failure and stage 1 through stage 4 chronic kidney disease, or unspecified chronic kidney disease: Secondary | ICD-10-CM | POA: Diagnosis present

## 2019-06-02 DIAGNOSIS — M5117 Intervertebral disc disorders with radiculopathy, lumbosacral region: Secondary | ICD-10-CM | POA: Diagnosis present

## 2019-06-02 DIAGNOSIS — G8929 Other chronic pain: Secondary | ICD-10-CM | POA: Diagnosis not present

## 2019-06-02 DIAGNOSIS — R7989 Other specified abnormal findings of blood chemistry: Secondary | ICD-10-CM | POA: Diagnosis not present

## 2019-06-02 DIAGNOSIS — M5441 Lumbago with sciatica, right side: Secondary | ICD-10-CM | POA: Diagnosis not present

## 2019-06-02 DIAGNOSIS — J159 Unspecified bacterial pneumonia: Secondary | ICD-10-CM | POA: Diagnosis present

## 2019-06-02 DIAGNOSIS — Z7982 Long term (current) use of aspirin: Secondary | ICD-10-CM | POA: Diagnosis not present

## 2019-06-02 DIAGNOSIS — Z79899 Other long term (current) drug therapy: Secondary | ICD-10-CM | POA: Diagnosis not present

## 2019-06-02 DIAGNOSIS — M5126 Other intervertebral disc displacement, lumbar region: Secondary | ICD-10-CM | POA: Diagnosis not present

## 2019-06-02 DIAGNOSIS — R778 Other specified abnormalities of plasma proteins: Secondary | ICD-10-CM | POA: Diagnosis present

## 2019-06-02 DIAGNOSIS — K219 Gastro-esophageal reflux disease without esophagitis: Secondary | ICD-10-CM | POA: Diagnosis present

## 2019-06-02 DIAGNOSIS — N1831 Chronic kidney disease, stage 3a: Secondary | ICD-10-CM | POA: Diagnosis present

## 2019-06-02 DIAGNOSIS — M4316 Spondylolisthesis, lumbar region: Secondary | ICD-10-CM | POA: Diagnosis present

## 2019-06-06 ENCOUNTER — Encounter: Payer: Self-pay | Admitting: Internal Medicine

## 2019-06-06 DIAGNOSIS — J9601 Acute respiratory failure with hypoxia: Secondary | ICD-10-CM | POA: Diagnosis not present

## 2019-06-06 MED ORDER — ASPIRIN 81 MG PO TBEC
81.00 | DELAYED_RELEASE_TABLET | ORAL | Status: DC
Start: 2019-06-06 — End: 2019-06-06

## 2019-06-06 MED ORDER — SPIRONOLACTONE 25 MG PO TABS
25.00 | ORAL_TABLET | ORAL | Status: DC
Start: 2019-06-06 — End: 2019-06-06

## 2019-06-06 MED ORDER — GUAIFENESIN 100 MG/5ML PO SYRP
200.00 | ORAL_SOLUTION | ORAL | Status: DC
Start: ? — End: 2019-06-06

## 2019-06-06 MED ORDER — GENERIC EXTERNAL MEDICATION
500.00 | Status: DC
Start: 2019-06-05 — End: 2019-06-06

## 2019-06-06 MED ORDER — ENOXAPARIN SODIUM 40 MG/0.4ML ~~LOC~~ SOLN
40.00 | SUBCUTANEOUS | Status: DC
Start: 2019-06-06 — End: 2019-06-06

## 2019-06-06 MED ORDER — FERROUS SULFATE 325 (65 FE) MG PO TBEC
325.00 | DELAYED_RELEASE_TABLET | ORAL | Status: DC
Start: 2019-06-06 — End: 2019-06-06

## 2019-06-06 MED ORDER — ALLOPURINOL 100 MG PO TABS
100.00 | ORAL_TABLET | ORAL | Status: DC
Start: 2019-06-06 — End: 2019-06-06

## 2019-06-06 MED ORDER — TORSEMIDE 20 MG PO TABS
20.00 | ORAL_TABLET | ORAL | Status: DC
Start: 2019-06-06 — End: 2019-06-06

## 2019-06-06 MED ORDER — GENERIC EXTERNAL MEDICATION
Status: DC
Start: ? — End: 2019-06-06

## 2019-06-06 MED ORDER — CYCLOBENZAPRINE HCL 10 MG PO TABS
10.00 | ORAL_TABLET | ORAL | Status: DC
Start: ? — End: 2019-06-06

## 2019-06-06 MED ORDER — CALCIUM CARBONATE 1250 (500 CA) MG PO CHEW
1.00 | CHEWABLE_TABLET | ORAL | Status: DC
Start: 2019-06-06 — End: 2019-06-06

## 2019-06-06 MED ORDER — SENNOSIDES-DOCUSATE SODIUM 8.6-50 MG PO TABS
2.00 | ORAL_TABLET | ORAL | Status: DC
Start: ? — End: 2019-06-06

## 2019-06-06 MED ORDER — GABAPENTIN 100 MG PO CAPS
100.00 | ORAL_CAPSULE | ORAL | Status: DC
Start: 2019-06-05 — End: 2019-06-06

## 2019-06-06 MED ORDER — METOPROLOL SUCCINATE ER 25 MG PO TB24
25.00 | ORAL_TABLET | ORAL | Status: DC
Start: 2019-06-06 — End: 2019-06-06

## 2019-06-06 MED ORDER — FUROSEMIDE 10 MG/ML IJ SOLN
40.00 | INTRAMUSCULAR | Status: DC
Start: 2019-06-06 — End: 2019-06-06

## 2019-06-06 MED ORDER — AMLODIPINE BESYLATE 5 MG PO TABS
5.00 | ORAL_TABLET | ORAL | Status: DC
Start: 2019-06-06 — End: 2019-06-06

## 2019-06-06 MED ORDER — POLYETHYLENE GLYCOL 3350 17 GM/SCOOP PO POWD
17.00 | ORAL | Status: DC
Start: ? — End: 2019-06-06

## 2019-06-06 MED ORDER — COLCHICINE 0.6 MG PO TABS
0.60 | ORAL_TABLET | ORAL | Status: DC
Start: ? — End: 2019-06-06

## 2019-06-06 MED ORDER — FAMOTIDINE 20 MG PO TABS
20.00 | ORAL_TABLET | ORAL | Status: DC
Start: 2019-06-05 — End: 2019-06-06

## 2019-06-06 MED ORDER — SODIUM CHLORIDE 0.9 % IV SOLN
10.00 | INTRAVENOUS | Status: DC
Start: ? — End: 2019-06-06

## 2019-06-06 MED ORDER — INSULIN GLARGINE 100 UNIT/ML ~~LOC~~ SOLN
1.00 | SUBCUTANEOUS | Status: DC
Start: 2019-06-05 — End: 2019-06-06

## 2019-06-06 MED ORDER — INSULIN LISPRO 100 UNIT/ML ~~LOC~~ SOLN
1.00 | SUBCUTANEOUS | Status: DC
Start: 2019-06-05 — End: 2019-06-06

## 2019-06-06 MED ORDER — INSULIN LISPRO 100 UNIT/ML ~~LOC~~ SOLN
1.00 | SUBCUTANEOUS | Status: DC
Start: ? — End: 2019-06-06

## 2019-06-06 MED ORDER — NITROGLYCERIN 0.4 MG SL SUBL
0.40 | SUBLINGUAL_TABLET | SUBLINGUAL | Status: DC
Start: ? — End: 2019-06-06

## 2019-06-06 MED ORDER — DEXAMETHASONE 4 MG PO TABS
4.00 | ORAL_TABLET | ORAL | Status: DC
Start: 2019-06-06 — End: 2019-06-06

## 2019-06-06 MED ORDER — POLYETHYLENE GLYCOL 3350 17 GM/SCOOP PO POWD
17.00 | ORAL | Status: DC
Start: 2019-06-06 — End: 2019-06-06

## 2019-06-06 MED ORDER — CHOLECALCIFEROL 25 MCG (1000 UT) PO TABS
1000.00 | ORAL_TABLET | ORAL | Status: DC
Start: 2019-06-06 — End: 2019-06-06

## 2019-06-06 MED ORDER — GENERIC EXTERNAL MEDICATION
2.00 | Status: DC
Start: 2019-06-05 — End: 2019-06-06

## 2019-06-06 MED ORDER — HYDROMORPHONE HCL 1 MG/ML IJ SOLN
0.50 | INTRAMUSCULAR | Status: DC
Start: ? — End: 2019-06-06

## 2019-06-06 MED ORDER — OXYCODONE HCL 5 MG PO TABS
5.00 | ORAL_TABLET | ORAL | Status: DC
Start: ? — End: 2019-06-06

## 2019-06-06 MED ORDER — ATORVASTATIN CALCIUM 20 MG PO TABS
20.00 | ORAL_TABLET | ORAL | Status: DC
Start: 2019-06-05 — End: 2019-06-06

## 2019-06-13 ENCOUNTER — Telehealth: Payer: Self-pay

## 2019-06-13 NOTE — Telephone Encounter (Signed)
The pt was notified that dr. Baird Cancer wants to increase the pt's amlodipine medication to 10 mg because the physical therapist emily  with [facility] called and said that the pt's bp was 170/110 and that the pt said that she took all her medications.

## 2019-06-16 ENCOUNTER — Other Ambulatory Visit: Payer: Self-pay | Admitting: Internal Medicine

## 2019-06-16 ENCOUNTER — Ambulatory Visit: Payer: Medicare Other

## 2019-06-16 ENCOUNTER — Other Ambulatory Visit: Payer: Self-pay

## 2019-06-16 DIAGNOSIS — N183 Chronic kidney disease, stage 3 unspecified: Secondary | ICD-10-CM

## 2019-06-16 DIAGNOSIS — I272 Pulmonary hypertension, unspecified: Secondary | ICD-10-CM

## 2019-06-16 MED ORDER — NITROGLYCERIN 0.4 MG SL SUBL: 0.4000 mg | SUBLINGUAL_TABLET | SUBLINGUAL | 2 refills | Status: AC | PRN

## 2019-06-16 MED ORDER — PITAVASTATIN CALCIUM 4 MG PO TABS: 4.0000 mg | ORAL_TABLET | Freq: Every day | ORAL | 2 refills | Status: AC

## 2019-06-16 NOTE — Chronic Care Management (AMB) (Signed)
 Chronic Care Management Pharmacy  Name: Anna Clay  MRN: [id number] DOB: 10/15/1950  Chief Complaint/ HPI  Anna Clay,  69 y.o. , female presents for their Initial CCM visit with the clinical pharmacist via telephone due to COVID-19 Pandemic.  PCP : Sanders, Robyn, MD  Their chronic conditions include: Diabetes, Acute right-sided CHF, Pulmonary hypertension, Hyperlipidemia  Office Visits: 06/13/19 Telephone Call: Pt instructed to increase amlodipine to 10mg daily due to home care physical therapist reporting pt BP of 170/110.  04/06/19 OV and AWV: Presented for follow up for DM and HTN. Pt had not yet established care with cardiologist in [place]. Referred to Ophthalmology for diabetic eye exam. Labs ordered (CMP14+EGFR, Lipid panel, HgbA1c, uric acid). Referred for bone density exam. Rx for Epipen given in case of reaction with COVID vaccine. Administered Pneumovax 23 today.   05/25/19 Patient message: Communicated to pt that bone density was normal and would recheck in 2 years.   12/29/18 OV: Presented for DM and HTN follow up. Toujeo decreased by 4 units daily (to 58 units) and send weekly BG log to office. Labs ordered (CBC no Diff, CMP14+EGFR, HgbA1c, Uric acid). Given exercises to help with shoulder pain and decreased ROM.    Consult Visits: 5/12-5/16/21 ED admission: Presented for worsening lower back pain and bilateral hip pain. Pt also reported falling at home today. Recommend pt follow up with cardiology within 2 weeks of discharge d/t positive troponins, but no associated symptoms. Findings included spinal stenosis, degenerative disk disease, spondylolisthesis, and disk herniation. Discharged on prednisone.   05/17/19 OV w/ T. Martialis, Spine specialist: Presented for further evaluation of hip and back pain. Started on cyclobenzaprine for muscle spasm. Pt stated tramadol caused SOB and declined opioids. Continue Tylenol and ibuprofen as instructed on label. Exercises  provided to help with flexibility and ROM. Follow up in 4-6 weeks for bilateral lumbar facet injections.   04/25/19 Orthopedic OV w/ Dr. Mason: Presented for evaluation of left hip. Symptoms present for 3-4 months and have worsened over that time. Pain appears to be coming from her back. Exams performed. Referred to Neuro/Spine program for further evaluation. Given a short Rx of tramadol.   CCM Encounters: 02/11/19 PharmD: Comprehensive medication review performed. Diabetes treatment regimen discussed and reviewed BG.   12/24/18 PharmD: Comprehensive medication review performed. Discussed pt's low BG, will consider decreasing basal insulin after reviewing BG log.   12/21/18 RN: Reviewed current treatment plans for chronic conditions. Discussed low BG in the morning, advised patient to eat a healthy snack at bedtime to help prevent hypoglycemia. Provided dietary recommendations. Care coordination with other HCPs.   Medications: Outpatient Encounter Medications as of 06/16/2019  Medication Sig  . Accu-Chek FastClix Lancets MISC USE AS DIRECTED TO CHECK BLOOD SUGARS 2 TIMES PER DAY DX:E11.65  . acetaminophen (TYLENOL) 650 MG CR tablet Take 1,300 mg by mouth every 8 (eight) hours as needed for pain.  . allopurinol (ZYLOPRIM) 100 MG tablet Take 1 tablet (100 mg total) by mouth daily.  . amLODipine (NORVASC) 5 MG tablet TAKE ONE TABLET BY MOUTH DAILY (Patient taking differently: 2 tabs daily)  . aspirin EC 81 MG tablet Take 81 mg by mouth daily.  . calcium carbonate (TUMS - DOSED IN MG ELEMENTAL CALCIUM) 500 MG chewable tablet Chew 2 tablets by mouth daily. 2 per day   . Cholecalciferol (VITAMIN D) 2000 UNITS CAPS Take 1 capsule by mouth daily.  . colchicine 0.6 MG tablet Take 1 tablet (0.6 mg total)   by mouth daily. (Patient taking differently: Take 0.6 mg by mouth daily. PRN for gout flare)  . ferrous sulfate 325 (65 FE) MG EC tablet Take 325 mg by mouth daily.  . fexofenadine (ALLEGRA) 180 MG tablet  Take 180 mg by mouth daily.  . Flaxseed, Linseed, (FLAXSEED OIL MAX STR) 1300 MG CAPS Take 1 capsule by mouth daily. 700mg capsule daily  . folic acid (FOLVITE) 1 MG tablet TAKE ONE TABLET BY MOUTH DAILY  . glucose blood (CONTOUR NEXT TEST) test strip Use as instructed to check blood sugars 2 times per day dx: e11.22  . HUMALOG KWIKPEN 200 UNIT/ML SOPN INJECT 6 UNITS BY SUBCUTANEOUS ROUTE  AT BREAKFAST, 8 UNITS AT LUNCH AND 10 UNITS AT DINNER (Patient taking differently: 6 units with breakfast, 6 units with lunch, and 8 units with dinner)  . magnesium oxide (MAG-OX) 400 MG tablet Take 400 mg by mouth daily.  . metoprolol succinate (TOPROL-XL) 25 MG 24 hr tablet Take 1 tablet (25 mg total) by mouth daily.  . Multiple Vitamin (MULTIVITAMIN WITH MINERALS) TABS tablet Take 1 tablet by mouth daily.  . nitroGLYCERIN (NITROSTAT) 0.4 MG SL tablet Place 1 tablet (0.4 mg total) under the tongue every 5 (five) minutes as needed for chest pain.  . NOVOFINE 32G X 6 MM MISC USE 5 TO 8 NEEDLES PER DAY AS DIRECTED PER INSULIN PROTOCOL  . Pitavastatin Calcium 4 MG TABS Take 1 tablet (4 mg total) by mouth daily.  . spironolactone (ALDACTONE) 25 MG tablet TAKE ONE TABLET BY MOUTH DAILY  . torsemide (DEMADEX) 20 MG tablet Take 1 tablet (20 mg total) by mouth daily.  . TOUJEO SOLOSTAR 300 UNIT/ML Solostar Pen INJECT 62 UNITS SUBCUTANEOUSLY EVERY NIGHT AT BEDTIME (Patient taking differently: Inject 58 Units into the skin at bedtime. )  . vitamin E (VITAMIN E) 180 MG (400 UNITS) capsule Take 400 Units by mouth daily.  . EPINEPHrine (EPIPEN 2-PAK) 0.3 mg/0.3 mL IJ SOAJ injection Inject 0.3 mLs (0.3 mg total) into the muscle as needed for anaphylaxis. (Patient not taking: Reported on 06/17/2019)  . Potassium Gluconate 550 MG TABS Take 1 tablet by mouth daily.  . ranitidine (ZANTAC) 150 MG tablet Take 150 mg by mouth daily as needed for heartburn.  . [DISCONTINUED] nitroGLYCERIN (NITROSTAT) 0.4 MG SL tablet Place 0.4 mg  under the tongue every 5 (five) minutes as needed for chest pain.  . [DISCONTINUED] Pitavastatin Calcium 4 MG TABS Take 4 mg by mouth daily.   No facility-administered encounter medications on file as of 06/16/2019.    Current Diagnosis/Assessment:  SDOH Interventions     Most Recent Value  SDOH Interventions  Financial Strain Interventions  Other (Comment) [Offered patient assistance for insulin when patient enters coverage gap with Medicare]      Goals Addressed            This Visit's Progress   . Pharmacy Care Plan       CARE PLAN ENTRY  Current Barriers:  . Chronic Disease Management support, education, and care coordination needs related to Hypertension, Hyperlipidemia, and Diabetes   Hypertension . Pharmacist Clinical Goal(s): o Over the next 90 days, patient will work with PharmD and providers to achieve BP goal <130/80 . Current regimen:  o Amlodipine 10mg daily . Interventions: o Will discuss hypertensive regimen with PCP following nurse BP check on 6/1 . Patient self care activities - Over the next 90 days, patient will: o Check BP once, document, and provide at future   appointments o Ensure daily salt intake < 2300 mg/day  Hyperlipidemia . Pharmacist Clinical Goal(s): o Over the next 90 days, patient will work with PharmD and providers to achieve LDL goal < 70 . Current regimen:  o Pitavastatin 3m daily . Interventions: o Contacted PCP to send in refills, patient is out of medication . Patient self care activities - Over the next 90 days, patient will: o Resume taking medication daily  Diabetes . Pharmacist Clinical Goal(s): o Over the next 90 days, patient will work with PharmD and providers to maintain A1c goal <7% . Current regimen:  o Toujeo 58 units at bedtime o Humalog Kwikpen 6 units at breakfast, 6 units at lunch, 8 units at dinner . Interventions: o Will investigate CGM coverage and options on insurance . Patient self care activities - Over  the next 90 days, patient will: o Check blood sugar 3-4 times daily, document, and provide at future appointments o Contact provider with any episodes of hypoglycemia  Medication management . Pharmacist Clinical Goal(s): o Over the next 90 days, patient will work with PharmD and providers to achieve optimal medication adherence . [facility]. Interventions o Comprehensive medication review performed. o Utilize [facility] for medication synchronization, packaging and delivery . Patient self care activities - Over the next 90 days, patient will: o Focus on medication adherence by synchronization of medications and utilization of adherence packaging o Take medications as prescribed o Report any questions or concerns to PharmD and/or provider(s)  Initial goal documentation        Diabetes   Recent Relevant Labs: Lab Results  Component Value Date/Time   HGBA1C 6.9 (H) 04/06/2019 05:07 PM   HGBA1C 7.0 (H) 12/29/2018 04:49 PM   EGFR 46 (L) 12/05/2016 08:13 AM   MICROALBUR 150 04/06/2019 04:18 PM   MICROALBUR 150 02/17/2018 04:35 PM    Kidney Function Lab Results  Component Value Date/Time   CREATININE 1.37 (H) 04/06/2019 05:07 PM   CREATININE 1.14 (H) 12/29/2018 04:49 PM   CREATININE 1.4 (H) 12/05/2016 08:13 AM   CREATININE 1.4 (H) 10/24/2016 08:30 AM   GFRNONAA 40 (L) 04/06/2019 05:07 PM   GFRAA 46 (L) 04/06/2019 05:07 PM   Checking BG: 3x per Day  Recent FBG Readings: On prednisone: 188,177,183,171,182,152,132  After completing prednisone: [phone] pre-meal BG readings:   On prednisone: 181,175,213,252,186,279,237,216,361,229,243,342 After completing prednisone: 219,180,77,128,90,77 Recent HS BG readings:  Patient has failed these meds in past: Victoza, metformin Patient is currently controlled on the following medications:  -Toujeo 58 units at bedtime -Humalog Kwikpen 6 units at breakfast, 6 units at lunch, 8 units at  dinner  Last diabetic Foot exam: 05/19/18  Last diabetic Eye exam:  Referred on 04/06/19  We discussed:  -Pt reports that both insulins are very affordable  -Pt reports occasional hypoglycemia (not in past 2-3 months). When she does experience hypoglycemic episode she eats a few glucose tablets and then will have a snack. -BG was elevated during prednisone treatment, but after completing treatment course her BG has decreased and mostly at goal -Pt is interested in CGM due to checking BG three times daily -Pt reports exercise has been difficult due to back and hip pain. Her walking has gotten progressively worse so she is not able to be very active.  -She is currently doing home physical therapy twice weekly for 3 weeks and then once weekly for 6 weeks.  -Pt's goal is to be able to walk up and down the hallway  at home without a walker.    Plan -Continue current medications  -Investigate CGM options and coverage with insurance -Will discuss dietary and exercise recommendations at follow up   Heart Failure   Type: Acute, right-sided  Last ejection fraction: 55-60% 01/2014  Patient has failed these meds in past: Hydralazine Patient is currently controlled on the following medications:  -Metoprolol succinate 25mg daily -Spironolactone 25mg daily -Torsemide 20mg daily  We discussed -weighing daily; if you gain more than 3 pounds in one day or 5 pounds in one week call your doctor  -Pt has not seen cardiologist in several years, PCP has referred pt in the past -Pt did not report and fluid accumulation or swelling  Plan -Continue current medications   Hypertension   Office blood pressures are  BP Readings from Last 3 Encounters:  04/06/19 138/78  04/06/19 138/78  12/29/18 132/84    Patient has failed these meds in the past: Olmesartan (stopped in hospital) Patient is currently uncontrolled on the following medications:  -Amlodipine 5mg 2 tablets daily  Patient checks BP at  home twice daily  Patient home BP readings are ranging: 160/100, 160/98, 134/109, 160/109  We discussed: -Pt stated that BP has been fine for years, but was elevated when she went to the hospital and has remained elevated.  -Pt unsure why olmesartan was stopped (possibly sue to kidney function) -Amlodipine increased to 2 tablets daily recently due to elevated Bps -Pt has nurse visit on 6/1 to check BP -Another medication may need to be added to control BP  Plan -Continue current medications   Hyperlipidemia   Lipid Panel     Component Value Date/Time   CHOL 176 04/06/2019 1707   TRIG 117 04/06/2019 1707   HDL 45 04/06/2019 1707   LDLCALC 110 (H) 04/06/2019 1707    The 10-year ASCVD risk score (Goff DC Jr., et al., 2013) is: 56%   Values used to calculate the score:     Age: 69 years     Sex: Female     Is Non-Hispanic African American: Yes     Diabetic: Yes     Tobacco smoker: Yes     Systolic Blood Pressure: 163 mmHg     Is BP treated: Yes     HDL Cholesterol: 45 mg/dL     Total Cholesterol: 176 mg/dL   Patient has failed these meds in past: Simvastatin Patient is currently uncontrolled on the following medications:  -Pitavastatin 4mg daily -Aspirin 81mg daily  We discussed:  -Pt reports that she has not been taking pitavastatin because she has been out of refills  Plan -Continue current medications  -Contacted PCP to send in refill for pitavastatin  -Will discuss dietary and exercise recommendations at follow up  Gout   Uric acid 10.3 on 04/06/19  Patient has failed these meds in past: N/A Patient is currently controlled on the following medications:  -Allopurinol 100mg daily -Colchicine 0.6mg daily  We discussed:  -Pt has not taken colchicine in month because she has not had any gout flares  Plan -Continue current medications -Review uric acid with patient at follow up  Vaccines   Reviewed and discussed patient's vaccination history.     Immunization History  Administered Date(s) Administered  . Influenza, High Dose Seasonal PF 01/27/2018  . Influenza,inj,Quad PF,6+ Mos 10/06/2013, 11/06/2018  . Pneumococcal Conjugate-13 01/27/2018  . Pneumococcal Polysaccharide-23 10/06/2013, 04/06/2019  . Tdap 11/06/2018   Plan Recommended patient receive Shingrix vaccine in pharmacy (discuss at follow   up)  Medication Management   Pt uses [facility] pharmacy for all medications Uses pill box? Yes Pt endorses 90% compliance  We discussed:  -Pt report it is time consuming filling up her pill box each week -The importance of medication adherence  Plan -Utilize [facility] for medication synchronization, packaging and delivery  Verbal consent obtained for [facility] enhanced pharmacy services (medication synchronization, adherence packaging, delivery coordination). A medication sync plan was created to allow patient to get all medications delivered once every 30 to 90 days per patient preference. Patient understands they have freedom to choose pharmacy and clinical pharmacist will coordinate care between all prescribers and [facility].   Follow up: 1 month phone visit   , PharmD Clinical Pharmacist [facility] [phone]   

## 2019-06-17 DIAGNOSIS — M5136 Other intervertebral disc degeneration, lumbar region: Secondary | ICD-10-CM | POA: Diagnosis not present

## 2019-06-17 DIAGNOSIS — M47816 Spondylosis without myelopathy or radiculopathy, lumbar region: Secondary | ICD-10-CM | POA: Diagnosis not present

## 2019-06-17 NOTE — Patient Instructions (Addendum)
Visit Information  Goals Addressed            This Visit's Progress   . Pharmacy Care Plan       CARE PLAN ENTRY  Current Barriers:  . Chronic Disease Management support, education, and care coordination needs related to Hypertension, Hyperlipidemia, and Diabetes   Hypertension . Pharmacist Clinical Goal(s): o Over the next 90 days, patient will work with PharmD and providers to achieve BP goal <130/80 . Current regimen:  o Amlodipine 10mg  daily . Interventions: o Will discuss hypertensive regimen with PCP following nurse BP check on 6/1 . Patient self care activities - Over the next 90 days, patient will: o Check BP once, document, and provide at future appointments o Ensure daily salt intake < 2300 mg/day  Hyperlipidemia . Pharmacist Clinical Goal(s): o Over the next 90 days, patient will work with PharmD and providers to achieve LDL goal < 70 . Current regimen:  o Pitavastatin 4mg  daily . Interventions: o Contacted PCP to send in refills, patient is out of medication . Patient self care activities - Over the next 90 days, patient will: o Resume taking medication daily  Diabetes . Pharmacist Clinical Goal(s): o Over the next 90 days, patient will work with PharmD and providers to maintain A1c goal <7% . Current regimen:  o Toujeo 58 units at bedtime o Humalog Kwikpen 6 units at breakfast, 6 units at lunch, 8 units at dinner . Interventions: o Will investigate CGM coverage and options on insurance . Patient self care activities - Over the next 90 days, patient will: o Check blood sugar 3-4 times daily, document, and provide at future appointments o Contact provider with any episodes of hypoglycemia  Medication management . Pharmacist Clinical Goal(s): o Over the next 90 days, patient will work with PharmD and providers to achieve optimal medication adherence . [facility] . Interventions o Comprehensive medication review performed. o Utilize  [facility] for medication synchronization, packaging and delivery . Patient self care activities - Over the next 90 days, patient will: o Focus on medication adherence by synchronization of medications and utilization of adherence packaging o Take medications as prescribed o Report any questions or concerns to PharmD and/or provider(s)  Initial goal documentation        Anna Clay was given information about Chronic Care Management services today including:  1. CCM service includes personalized support from designated clinical staff supervised by her physician, including individualized plan of care and coordination with other care providers 2. 24/7 contact phone numbers for assistance for urgent and routine care needs. 3. Standard insurance, coinsurance, copays and deductibles apply for chronic care management only during months in which we provide at least 20 minutes of these services. Most insurances cover these services at 100%, however patients may be responsible for any copay, coinsurance and/or deductible if applicable. This service may help you avoid the need for more expensive face-to-face services. 4. Only one practitioner may furnish and bill the service in a calendar month. 5. The patient may stop CCM services at any time (effective at the end of the month) by phone call to the office staff.  Patient agreed to services and verbal consent obtained.   The patient verbalized understanding of instructions provided today and agreed to receive a mailed copy of patient instruction and/or educational materials. Telephone follow up appointment with pharmacy team member scheduled for: 07/29/19 @ 12:45pm  [name], PharmD Clinical Pharmacist [facility] [phone]   Diabetes  [place] and Nutrition, Adult When you have diabetes (diabetes mellitus), it is very important to have healthy eating habits because your blood sugar (glucose) levels are greatly  affected by what you eat and drink. Eating healthy foods in the appropriate amounts, at about the same times every day, can help you:  Control your blood glucose.  Lower your risk of heart disease.  Improve your blood pressure.  Reach or maintain a healthy weight. Every person with diabetes is different, and each person has different needs for a meal plan. Your health care provider may recommend that you work with a diet and nutrition specialist (dietitian) to make a meal plan that is best for you. Your meal plan may vary depending on factors such as:  The calories you need.  The medicines you take.  Your weight.  Your blood glucose, blood pressure, and cholesterol levels.  Your activity level.  Other health conditions you have, such as heart or kidney disease. How do carbohydrates affect me? Carbohydrates, also called carbs, affect your blood glucose level more than any other type of food. Eating carbs naturally raises the amount of glucose in your blood. Carb counting is a method for keeping track of how many carbs you eat. Counting carbs is important to keep your blood glucose at a healthy level, especially if you use insulin or take certain oral diabetes medicines. It is important to know how many carbs you can safely have in each meal. This is different for every person. Your dietitian can help you calculate how many carbs you should have at each meal and for each snack. Foods that contain carbs include:  Bread, cereal, rice, pasta, and crackers.  Potatoes and corn.  Peas, beans, and lentils.  Milk and yogurt.  Fruit and juice.  Desserts, such as cakes, cookies, ice cream, and candy. How does alcohol affect me? Alcohol can cause a sudden decrease in blood glucose (hypoglycemia), especially if you use insulin or take certain oral diabetes medicines. Hypoglycemia can be a life-threatening condition. Symptoms of hypoglycemia (sleepiness, dizziness, and confusion) are similar  to symptoms of having too much alcohol. If your health care provider says that alcohol is safe for you, follow these guidelines:  Limit alcohol intake to no more than 1 drink per day for nonpregnant women and 2 drinks per day for men. One drink equals 12 oz of beer, 5 oz of wine, or 1 oz of hard liquor.  Do not drink on an empty stomach.  Keep yourself hydrated with water, diet soda, or unsweetened iced tea.  Keep in mind that regular soda, juice, and other mixers may contain a lot of sugar and must be counted as carbs. What are tips for following this plan?  Reading food labels  Start by checking the serving size on the "Nutrition Facts" label of packaged foods and drinks. The amount of calories, carbs, fats, and other nutrients listed on the label is based on one serving of the item. Many items contain more than one serving per package.  Check the total grams (g) of carbs in one serving. You can calculate the number of servings of carbs in one serving by dividing the total carbs by 15. For example, if a food has 30 g of total carbs, it would be equal to 2 servings of carbs.  Check the number of grams (g) of saturated and trans fats in one serving. Choose foods that have low or no amount of these fats.  Check the number of  milligrams (mg) of salt (sodium) in one serving. Most people should limit total sodium intake to less than 2,300 mg per day.  Always check the nutrition information of foods labeled as "low-fat" or "nonfat". These foods may be higher in added sugar or refined carbs and should be avoided.  Talk to your dietitian to identify your daily goals for nutrients listed on the label. Shopping  Avoid buying canned, premade, or processed foods. These foods tend to be high in fat, sodium, and added sugar.  Shop around the outside edge of the grocery store. This includes fresh fruits and vegetables, bulk grains, fresh meats, and fresh dairy. Cooking  Use low-heat cooking  methods, such as baking, instead of high-heat cooking methods like deep frying.  Cook using healthy oils, such as olive, canola, or sunflower oil.  Avoid cooking with butter, cream, or high-fat meats. Meal planning  Eat meals and snacks regularly, preferably at the same times every day. Avoid going long periods of time without eating.  Eat foods high in fiber, such as fresh fruits, vegetables, beans, and whole grains. Talk to your dietitian about how many servings of carbs you can eat at each meal.  Eat 4-6 ounces (oz) of lean protein each day, such as lean meat, chicken, fish, eggs, or tofu. One oz of lean protein is equal to: ? 1 oz of meat, chicken, or fish. ? 1 egg. ?  cup of tofu.  Eat some foods each day that contain healthy fats, such as avocado, nuts, seeds, and fish. Lifestyle  Check your blood glucose regularly.  Exercise regularly as told by your health care provider. This may include: ? 150 minutes of moderate-intensity or vigorous-intensity exercise each week. This could be brisk walking, biking, or water aerobics. ? Stretching and doing strength exercises, such as yoga or weightlifting, at least 2 times a week.  Take medicines as told by your health care provider.  Do not use any products that contain nicotine or tobacco, such as cigarettes and e-cigarettes. If you need help quitting, ask your health care provider.  Work with a Social worker or diabetes educator to identify strategies to manage stress and any emotional and social challenges. Questions to ask a health care provider  Do I need to meet with a diabetes educator?  Do I need to meet with a dietitian?  What number can I call if I have questions?  When are the best times to check my blood glucose? Where to find more information:  American Diabetes Association: diabetes.org  Academy of Nutrition and Dietetics: www.eatright.[employer] of Diabetes and Digestive and Kidney Diseases (NIH):  [employer] Summary  A healthy meal plan will help you control your blood glucose and maintain a healthy lifestyle.  Working with a diet and nutrition specialist (dietitian) can help you make a meal plan that is best for you.  Keep in mind that carbohydrates (carbs) and alcohol have immediate effects on your blood glucose levels. It is important to count carbs and to use alcohol carefully. This information is not intended to replace advice given to you by your health care provider. Make sure you discuss any questions you have with your health care provider. Document Revised: 12/19/2016 Document Reviewed: 02/11/2016 Elsevier Patient Education  2020 [employer].

## 2019-06-21 ENCOUNTER — Other Ambulatory Visit: Payer: Self-pay

## 2019-06-21 ENCOUNTER — Ambulatory Visit (INDEPENDENT_AMBULATORY_CARE_PROVIDER_SITE_OTHER): Payer: Medicare Other

## 2019-06-21 VITALS — BP 146/88 | HR 94 | Temp 98.3°F | Ht 60.6 in | Wt 247.2 lb

## 2019-06-21 DIAGNOSIS — I10 Essential (primary) hypertension: Secondary | ICD-10-CM

## 2019-06-21 MED ORDER — TORSEMIDE 20 MG PO TABS: 20.0000 mg | ORAL_TABLET | Freq: Every day | ORAL | 1 refills | Status: DC

## 2019-06-21 MED ORDER — HUMALOG KWIKPEN 200 UNIT/ML ~~LOC~~ SOPN: PEN_INJECTOR | SUBCUTANEOUS | 1 refills | Status: DC

## 2019-06-21 MED ORDER — AMLODIPINE BESYLATE 5 MG PO TABS: 10.0000 mg | ORAL_TABLET | Freq: Every day | ORAL | 1 refills | Status: DC

## 2019-06-21 MED ORDER — METOPROLOL SUCCINATE ER 25 MG PO TB24: 25.0000 mg | ORAL_TABLET | Freq: Every day | ORAL | 1 refills | Status: DC

## 2019-06-21 MED ORDER — TOUJEO SOLOSTAR 300 UNIT/ML ~~LOC~~ SOPN: 58.0000 [IU] | PEN_INJECTOR | Freq: Every day | SUBCUTANEOUS | 1 refills | Status: DC

## 2019-06-21 MED ORDER — SPIRONOLACTONE 25 MG PO TABS: 25.0000 mg | ORAL_TABLET | Freq: Every day | ORAL | 1 refills | Status: DC

## 2019-06-21 NOTE — Progress Notes (Addendum)
Pt presents today for b/p check 146/88 Pt is currently on amlodipine 10mg , metoprolol succinate 25mg , torsemide 20mg  Per RS Once I see Cardiology last note , and her nextgen meds i will have a treatment plan.  Per Dr. Baird Cancer give pt [name] 5mg , have her come back in 3wks to have an appt w/her

## 2019-06-28 ENCOUNTER — Other Ambulatory Visit: Payer: Self-pay

## 2019-06-28 MED ORDER — GABAPENTIN 100 MG PO CAPS
100.0000 mg | ORAL_CAPSULE | Freq: Three times a day (TID) | ORAL | 2 refills | Status: DC
Start: 2019-06-28 — End: 2019-08-08

## 2019-06-28 MED ORDER — PANTOPRAZOLE SODIUM 40 MG PO TBEC
40.0000 mg | DELAYED_RELEASE_TABLET | Freq: Every day | ORAL | 2 refills | Status: DC
Start: 2019-06-28 — End: 2020-04-20

## 2019-06-30 ENCOUNTER — Ambulatory Visit (INDEPENDENT_AMBULATORY_CARE_PROVIDER_SITE_OTHER): Payer: Medicare Other

## 2019-06-30 ENCOUNTER — Other Ambulatory Visit: Payer: Self-pay

## 2019-06-30 ENCOUNTER — Telehealth: Payer: Medicare Other

## 2019-06-30 DIAGNOSIS — I10 Essential (primary) hypertension: Secondary | ICD-10-CM

## 2019-06-30 DIAGNOSIS — N183 Chronic kidney disease, stage 3 unspecified: Secondary | ICD-10-CM | POA: Diagnosis not present

## 2019-06-30 DIAGNOSIS — M1612 Unilateral primary osteoarthritis, left hip: Secondary | ICD-10-CM

## 2019-06-30 DIAGNOSIS — G4733 Obstructive sleep apnea (adult) (pediatric): Secondary | ICD-10-CM

## 2019-06-30 DIAGNOSIS — Z794 Long term (current) use of insulin: Secondary | ICD-10-CM

## 2019-06-30 DIAGNOSIS — E1122 Type 2 diabetes mellitus with diabetic chronic kidney disease: Secondary | ICD-10-CM | POA: Diagnosis not present

## 2019-06-30 DIAGNOSIS — I272 Pulmonary hypertension, unspecified: Secondary | ICD-10-CM

## 2019-06-30 DIAGNOSIS — M25559 Pain in unspecified hip: Secondary | ICD-10-CM

## 2019-07-01 ENCOUNTER — Other Ambulatory Visit: Payer: Self-pay

## 2019-07-01 MED ORDER — NOVOFINE 32G X 6 MM MISC: 9 refills | Status: DC

## 2019-07-04 ENCOUNTER — Telehealth: Payer: Self-pay

## 2019-07-04 ENCOUNTER — Other Ambulatory Visit: Payer: Self-pay

## 2019-07-04 NOTE — Telephone Encounter (Signed)
The pt wanted to know if she can do a virtual visit and the pt was told that Dr. Baird Cancer said depending on what the pt's issues are that the pt may need to come in but that the f/u can be virtually and the pt can do lab work at a [place] facility vi where she stays.

## 2019-07-04 NOTE — Chronic Care Management (AMB) (Signed)
Chronic Care Management   Follow Up Note   06/30/2019 Name: Anna Clay MRN: [id number] DOB: Jun 03, 1950  Referred by: Glendale Chard, MD Reason for referral : Chronic Care Management (FU RN CM Call )   Anna Clay is a 69 y.o. year old female who is a primary care patient of Glendale Chard, MD. The CCM team was consulted for assistance with chronic disease management and care coordination needs.    Review of patient status, including review of consultants reports, relevant laboratory and other test results, and collaboration with appropriate care team members and the patient's provider was performed as part of comprehensive patient evaluation and provision of chronic care management services.    SDOH (Social Determinants of Health) assessments performed: Yes - no acute needs See Care Plan activities for detailed interventions related to [place])   Placed outbound CCM RN CM follow up call to patient for a care plan update.     Outpatient Encounter Medications as of 06/30/2019  Medication Sig  . acetaminophen (TYLENOL) 650 MG CR tablet Take 1,300 mg by mouth every 8 (eight) hours as needed for pain.  Marland Kitchen gabapentin (NEURONTIN) 100 MG capsule Take 1 capsule (100 mg total) by mouth 3 (three) times daily.  . Accu-Chek FastClix Lancets MISC USE AS DIRECTED TO CHECK BLOOD SUGARS 2 TIMES PER DAY DX:E11.65  . allopurinol (ZYLOPRIM) 100 MG tablet Take 1 tablet (100 mg total) by mouth daily.  Marland Kitchen amLODipine (NORVASC) 5 MG tablet Take 2 tablets (10 mg total) by mouth daily.  Marland Kitchen aspirin EC 81 MG tablet Take 81 mg by mouth daily.  . calcium carbonate (TUMS - DOSED IN MG ELEMENTAL CALCIUM) 500 MG chewable tablet Chew 2 tablets by mouth daily. 2 per day   . Cholecalciferol (VITAMIN D) 2000 UNITS CAPS Take 1 capsule by mouth daily.  . colchicine 0.6 MG tablet Take 1 tablet (0.6 mg total) by mouth daily. (Patient taking differently: Take 0.6 mg by mouth daily. PRN for gout flare)  . EPINEPHrine (EPIPEN  2-PAK) 0.3 mg/0.3 mL IJ SOAJ injection Inject 0.3 mLs (0.3 mg total) into the muscle as needed for anaphylaxis. (Patient not taking: Reported on 06/17/2019)  . ferrous sulfate 325 (65 FE) MG EC tablet Take 325 mg by mouth daily.  . fexofenadine (ALLEGRA) 180 MG tablet Take 180 mg by mouth daily.  . Flaxseed, Linseed, (FLAXSEED OIL MAX STR) 1300 MG CAPS Take 1 capsule by mouth daily. 73m capsule daily  . folic acid (FOLVITE) 1 MG tablet TAKE ONE TABLET BY MOUTH DAILY  . glucose blood (CONTOUR NEXT TEST) test strip Use as instructed to check blood sugars 2 times per day dx: e11.22  . insulin glargine, 1 Unit Dial, (TOUJEO SOLOSTAR) 300 UNIT/ML Solostar Pen Inject 58 Units into the skin at bedtime.  . insulin lispro (HUMALOG KWIKPEN) 200 UNIT/ML KwikPen 6 units with breakfast, 6 units with lunch, and 8 units with dinner  . magnesium oxide (MAG-OX) 400 MG tablet Take 400 mg by mouth daily.  . metoprolol succinate (TOPROL-XL) 25 MG 24 hr tablet Take 1 tablet (25 mg total) by mouth daily.  . Multiple Vitamin (MULTIVITAMIN WITH MINERALS) TABS tablet Take 1 tablet by mouth daily.  . nitroGLYCERIN (NITROSTAT) 0.4 MG SL tablet Place 1 tablet (0.4 mg total) under the tongue every 5 (five) minutes as needed for chest pain.  . pantoprazole (PROTONIX) 40 MG tablet Take 1 tablet (40 mg total) by mouth daily.  . Pitavastatin Calcium 4 MG TABS Take  1 tablet (4 mg total) by mouth daily.  . Potassium Gluconate 550 MG TABS Take 1 tablet by mouth daily.  . ranitidine (ZANTAC) 150 MG tablet Take 150 mg by mouth daily as needed for heartburn.  . spironolactone (ALDACTONE) 25 MG tablet Take 1 tablet (25 mg total) by mouth daily.  Marland Kitchen torsemide (DEMADEX) 20 MG tablet Take 1 tablet (20 mg total) by mouth daily.  . vitamin E (VITAMIN E) 180 MG (400 UNITS) capsule Take 400 Units by mouth daily.  . [DISCONTINUED] NOVOFINE 32G X 6 MM MISC USE 5 TO 8 NEEDLES PER DAY AS DIRECTED PER INSULIN PROTOCOL   No facility-administered  encounter medications on file as of 06/30/2019.     Objective:  Lab Results  Component Value Date   HGBA1C 6.9 (H) 04/06/2019   HGBA1C 7.0 (H) 12/29/2018   HGBA1C 8.0 (H) 08/18/2018   Lab Results  Component Value Date   MICROALBUR 150 04/06/2019   LDLCALC 110 (H) 04/06/2019   CREATININE 1.37 (H) 04/06/2019   BP Readings from Last 3 Encounters:  06/21/19 (!) 146/88  04/06/19 138/78  04/06/19 138/78   Goals Addressed      Patient Stated   .  COMPLETED: "I am having some shoulder pain" (pt-stated)        Current Barriers:  Marland Kitchen Knowledge Deficits related to diagnosis and treatment of left shoulder pain   Nurse Case Manager Clinical Goal(s):  Marland Kitchen Over the next 30 days, patient will work with PCP to address needs related to left shoulder pain and decreased ROM   CCM RN CM Interventions:  06/30/19 call completed with patient . Evaluation of current treatment plan related to left shoulder pain and patient's adherence to plan as established by provider . Determined patient completed in home PT for shoulder pain and strengthening with good effectiveness  . Determined she continues to follow her HEP and feels this condition is better managed at this time  . Discussed plans with patient for ongoing care management follow up and provided patient with direct contact information for care management team  Patient Self Care Activities:  . Self administers medications as prescribed . Attends all scheduled provider appointments . Calls pharmacy for medication refills . Performs ADL's independently . Performs IADL's independently . Calls provider office for new concerns or questions  Please see past updates related to this goal by clicking on the "Past Updates" button in the selected goal      .  COMPLETED: "I need help getting my CPAP supplies" (pt-stated)        Current Barriers:  . Inability to purchase new CPAP supplies due to need for new MD Rx  Nurse Case Manager Clinical Goal(s):    Marland Kitchen Over the next 30 days, patient will verbalize understanding of plan for order and purchase of Respro CPAP supplies. Goal Met . 07/27/18 Over the next 30 days, patient will have requested and received a reservoir replacement for her CPAP machine   CCM RN CM Interventions:  06/30/19: completed call with patient . Evaluation of current treatment plan related to indication and usage of CPAP and patient's adherence to plan as established by provider  . Confirmed patient received her CPAP supplies and is adherent with using her CPAP machine as directed . Discussed plans with patient for ongoing care management follow up and provided patient with direct contact information for care management team   Patient Self Care Activities:  . Self administers medications as prescribed . Attends all scheduled  provider appointments . Calls pharmacy for medication refills . Attends church or other social activities . Performs ADL's independently . Performs IADL's independently . Calls provider office for new concerns or questions  Please see past updates related to this goal by clicking on the "Past Updates" button in the selected goal       .  "to continue to get stronger following my back surgery" (pt-stated)        [place] (see longitudinal plan of care for additional care plan information)  Current Barriers:  Marland Kitchen Knowledge Deficits related to treatment management of Spondylosis of lumbar, Lumbar facet arthropathy and DDD lumbar . Chronic Disease Management support and education needs related to DM II, Pulmonary Hypertension, OSA  Nurse Case Manager Clinical Goal(s):  Marland Kitchen Over the next 90 days, patient will work with CCM team and PCP to address needs related to disease education and support to improve Self Health management of chronic Spondylosis of lumbar, Lumbar facet arthropathy and DDD of her lumbar   CCM RN CM Interventions:  06/30/19 call completed with patient . Inter-disciplinary  care team collaboration (see longitudinal plan of care) . Evaluation of current treatment plan related to Spondylosis of lumbar and DDD, arthropathy of lumbar and patient's adherence to plan as established by provider . Determined patient was admitted to [facility] on [facility] after falling in her home and experiencing excruciated pain to her lower back and lower extremities along with other neurological symptoms such as numbness and tingling down her buttocks into her lower extremities . Discussed and reviewed the following diagnosis/treatment;  o 1. Spondylosis of lumbar region without myelopathy or radiculopathy (Primary) 2. DDD (degenerative disc disease), lumbar 3. Lumbar facet arthropathy o While in the hospital patient received an L2-L3 and L5-S1 TFESI by Dr. Vinie Sill where she endorsed having 100% relief of radicular leg pain and lower back pain. o Patient had L2-L3 and L5-S1 translaminar epidural steroid injection by Dr. Vinie Sill on 06/03/19 at [facility] and her bilateral buttock pain resolved completely. Constant numbness in bilateral legs and feet. Pain Left more so than right anterior thigh.  o In home PT to resume . Determined patient completed a post operative Neurosurgical f/u a the [facility] with Dr. Brayton El NP with the following visit note reviewed;  o Risks, benefits, and alternatives of the medications and treatment plan prescribed today were discussed, and patient expressed understanding. o Plan follow-up as discussed or as needed if any worsening symptoms or change in condition.  o A yearly preventative health exam was recommended and current age based recommendations were discussed . Discussed plans with patient for ongoing care management follow up and provided patient with direct contact information for care management team  Patient Self Care Activities:  . Self administers medications as prescribed . Attends all scheduled  provider appointments . Calls pharmacy for medication refills . Calls provider office for new concerns or questions  Initial goal documentation     .  "to keep my Diabetes under good control" (pt-stated)        Current Barriers:  . Chronic Disease Management support and education needs related to Diabetes  Nurse Case Manager Clinical Goal(s):  Marland Kitchen New 06/30/19 Over the next 90 days, patient will continue to engage with the CCM team and PCP for ongoing support and disease education to help improve her Self Health management of her DM  CCM RN CM Interventions:  06/30/19 call completed with  patient . Evaluation of current treatment plan related to Diabetes and patient's adherence to plan as established by provider . Determined patient's A1c remains to be stable at 6.9 %; Educated on daily glycemic control FBS 80-130, <180 after meals, Educated on 15'15' rule if needed for hypoglycemic events . Reviewed meal planning and discussed typical meals for patient; education provided of importance of implementing daily exercise routine, 150 minutes per week, minimal is recommended by ADA - pt is following a daily HEP currently . Reviewed medications with patient and discussed patient is taking and adhering to the following DM medication regimen o  Toujeo 58 units at bedtime as directed  o  Humalog insulin 6 units at breakfast, 6 units at lunch and 8 units at bedtime . Reviewed and discussed upcoming MD/Pharm D f/u visits scheduled  . Discussed plans with patient for ongoing care management follow up and provided patient with direct contact information for care management team  Patient Self Care Activities:  . Self administers medications as prescribed . Attends all scheduled provider appointments . Calls pharmacy for medication refills . Performs ADL's independently . Performs IADL's independently . Calls provider office for new concerns or questions  Please see past updates related to this goal  by clicking on the "Past Updates" button in the selected goal      .  COMPLETED: I would like to manage my diabetes (pt-stated)        Current Barriers:  . Diabetes: W1XB; complicated by chronic medical conditions including HTN, HLD, most recent A1c 7% ON 12/29/18 . Current antihyperglycemic regimen: Toujeo 58 units qHS, Humalog meal time 6 units breakfast, 8 units lunch, 10 units with dinner o Patient's Bgs range 80-100 o Denies hypoglycemia (Bg<70) o Reports one incident where BG was 74 (due to no carbohydrate dinner and light lunch) o Continue current medication regimen as above . Reports hypoglycemic symptoms, including dizziness, lightheadedness, shaking, sweating (3 weeks ago) . Denies hyperglycemic symptoms . Current meal patterns: reports eating better, reducing carbohydrate intake.  Her son is helping to prepare meals, etc . Current exercise: reports light walking . Cardiovascular risk reduction: o Current hypertensive regimen: amlodipine, metoprolol. spironolactone o Current hyperlipidemia regimen: pitivastatin - LDL on 4/29 was 100 o Current antiplatelet regimen: n/a  Pharmacist Clinical Goal(s):  Marland Kitchen Over the next 90 days, patient will work with PharmD and primary care provider to address needs related to optimization of medication management of chronic conditions  Interventions: . Comprehensive medication review performed, medication list updated in electronic medical record . Reviewed & discussed the following diabetes-related information with patient: o Follow ADA recommended "diabetes-friendly" diet  (reviewed healthy snack/food options) o Discussed insulin injection technique o Reviewed medication purpose/side effects-->counseled on signs/symptoms of hypoglycemia  Patient Self Care Activities:  . Patient will check blood glucose 3-4 times daily as directed , document, and provide at future appointments . Patient will focus on medication adherence by continuing to take  medications as prescribed . Patient will take medications as prescribed . Patient will contact provider with any episodes of hypoglycemia . Patient will report any questions or concerns to provider   Initial goal documentation       Telephone follow up appointment with care management team member scheduled for: 08/05/19  [name], RN, BSN, CCM Care Management Coordinator [place] [facility]  Direct Phone: [phone]

## 2019-07-04 NOTE — Patient Instructions (Signed)
Visit Information  Goals Addressed      Patient Stated   .  COMPLETED: "I am having some shoulder pain" (pt-stated)        Current Barriers:  Marland Kitchen Knowledge Deficits related to diagnosis and treatment of left shoulder pain   Nurse Case Manager Clinical Goal(s):  Marland Kitchen Over the next 30 days, patient will work with PCP to address needs related to left shoulder pain and decreased ROM   CCM RN CM Interventions:  06/30/19 call completed with patient . Evaluation of current treatment plan related to left shoulder pain and patient's adherence to plan as established by provider . Determined patient completed in home PT for shoulder pain and strengthening with good effectiveness  . Determined she continues to follow her HEP and feels this condition is better managed at this time  . Discussed plans with patient for ongoing care management follow up and provided patient with direct contact information for care management team  Patient Self Care Activities:  . Self administers medications as prescribed . Attends all scheduled provider appointments . Calls pharmacy for medication refills . Performs ADL's independently . Performs IADL's independently . Calls provider office for new concerns or questions  Please see past updates related to this goal by clicking on the "Past Updates" button in the selected goal      .  COMPLETED: "I need help getting my CPAP supplies" (pt-stated)        Current Barriers:  . Inability to purchase new CPAP supplies due to need for new MD Rx  Nurse Case Manager Clinical Goal(s):  Marland Kitchen Over the next 30 days, patient will verbalize understanding of plan for order and purchase of Respro CPAP supplies. Goal Met . 07/27/18 Over the next 30 days, patient will have requested and received a reservoir replacement for her CPAP machine   CCM RN CM Interventions:  06/30/19: completed call with patient . Evaluation of current treatment plan related to indication and usage of CPAP and  patient's adherence to plan as established by provider  . Confirmed patient received her CPAP supplies and is adherent with using her CPAP machine as directed . Discussed plans with patient for ongoing care management follow up and provided patient with direct contact information for care management team   Patient Self Care Activities:  . Self administers medications as prescribed . Attends all scheduled provider appointments . Calls pharmacy for medication refills . Attends church or other social activities . Performs ADL's independently . Performs IADL's independently . Calls provider office for new concerns or questions  Please see past updates related to this goal by clicking on the "Past Updates" button in the selected goal       .  "to continue to get stronger following my back surgery" (pt-stated)        [place] (see longitudinal plan of care for additional care plan information)  Current Barriers:  Marland Kitchen Knowledge Deficits related to treatment management of Spondylosis of lumbar, Lumbar facet arthropathy and DDD lumbar . Chronic Disease Management support and education needs related to DM II, Pulmonary Hypertension, OSA  Nurse Case Manager Clinical Goal(s):  Marland Kitchen Over the next 90 days, patient will work with CCM team and PCP to address needs related to disease education and support to improve Self Health management of chronic Spondylosis of lumbar, Lumbar facet arthropathy and DDD of her lumbar   CCM RN CM Interventions:  06/30/19 call completed with patient . Inter-disciplinary care team collaboration (see longitudinal  plan of care) . Evaluation of current treatment plan related to Spondylosis of lumbar and DDD, arthropathy of lumbar and patient's adherence to plan as established by provider . Determined patient was admitted to [facility] on 06/01/19 after falling in her home and experiencing excruciated pain to her lower back and lower extremities along with other  neurological symptoms such as numbness and tingling down her buttocks into her lower extremities . Discussed and reviewed the following diagnosis/treatment;  o 1. Spondylosis of lumbar region without myelopathy or radiculopathy (Primary) 2. DDD (degenerative disc disease), lumbar 3. Lumbar facet arthropathy o While in the hospital patient received an L2-L3 and L5-S1 TFESI by Dr. Vinie Sill where she endorsed having 100% relief of radicular leg pain and lower back pain. o Patient had L2-L3 and L5-S1 translaminar epidural steroid injection by Dr. Vinie Sill on 06/03/19 at [facility] and her bilateral buttock pain resolved completely. Constant numbness in bilateral legs and feet. Pain Left more so than right anterior thigh.  o In home PT to resume . Determined patient completed a post operative Neurosurgical f/u a [facility] with Dr. Brayton El NP with the following visit note reviewed;  o Risks, benefits, and alternatives of the medications and treatment plan prescribed today were discussed, and patient expressed understanding. o Plan follow-up as discussed or as needed if any worsening symptoms or change in condition.  o A yearly preventative health exam was recommended and current age based recommendations were discussed . Discussed plans with patient for ongoing care management follow up and provided patient with direct contact information for care management team  Patient Self Care Activities:  . Self administers medications as prescribed . Attends all scheduled provider appointments . Calls pharmacy for medication refills . Calls provider office for new concerns or questions  Initial goal documentation     .  "to keep my Diabetes under good control" (pt-stated)        Current Barriers:  . Chronic Disease Management support and education needs related to Diabetes  Nurse Case Manager Clinical Goal(s):  Marland Kitchen New 06/30/19 Over the next 90 days, patient  will continue to engage with the CCM team and PCP for ongoing support and disease education to help improve her Self Health management of her DM  CCM RN CM Interventions:  06/30/19 call completed with patient . Evaluation of current treatment plan related to Diabetes and patient's adherence to plan as established by provider . Determined patient's A1c remains to be stable at 6.9 %; Educated on daily glycemic control FBS 80-130, <180 after meals, Educated on 15'15' rule if needed for hypoglycemic events . Reviewed meal planning and discussed typical meals for patient; education provided of importance of implementing daily exercise routine, 150 minutes per week, minimal is recommended by ADA - pt is following a daily HEP currently . Reviewed medications with patient and discussed patient is taking and adhering to the following DM medication regimen o  Toujeo 58 units at bedtime as directed  o  Humalog insulin 6 units at breakfast, 6 units at lunch and 8 units at bedtime . Reviewed and discussed upcoming MD/Pharm D f/u visits scheduled  . Discussed plans with patient for ongoing care management follow up and provided patient with direct contact information for care management team  Patient Self Care Activities:  . Self administers medications as prescribed . Attends all scheduled provider appointments . Calls pharmacy for medication refills . Performs ADL's independently . Performs  IADL's independently . Calls provider office for new concerns or questions  Please see past updates related to this goal by clicking on the "Past Updates" button in the selected goal      .  COMPLETED: I would like to manage my diabetes (pt-stated)        Current Barriers:  . Diabetes: Z7BV; complicated by chronic medical conditions including HTN, HLD, most recent A1c 7% ON 12/29/18 . Current antihyperglycemic regimen: Toujeo 58 units qHS, Humalog meal time 6 units breakfast, 8 units lunch, 10 units with  dinner o Patient's Bgs range 80-100 o Denies hypoglycemia (Bg<70) o Reports one incident where BG was 74 (due to no carbohydrate dinner and light lunch) o Continue current medication regimen as above . Reports hypoglycemic symptoms, including dizziness, lightheadedness, shaking, sweating (3 weeks ago) . Denies hyperglycemic symptoms . Current meal patterns: reports eating better, reducing carbohydrate intake.  Her son is helping to prepare meals, etc . Current exercise: reports light walking . Cardiovascular risk reduction: o Current hypertensive regimen: amlodipine, metoprolol. spironolactone o Current hyperlipidemia regimen: pitivastatin - LDL on 4/29 was 100 o Current antiplatelet regimen: n/a  Pharmacist Clinical Goal(s):  Marland Kitchen Over the next 90 days, patient will work with PharmD and primary care provider to address needs related to optimization of medication management of chronic conditions  Interventions: . Comprehensive medication review performed, medication list updated in electronic medical record . Reviewed & discussed the following diabetes-related information with patient: o Follow ADA recommended "diabetes-friendly" diet  (reviewed healthy snack/food options) o Discussed insulin injection technique o Reviewed medication purpose/side effects-->counseled on signs/symptoms of hypoglycemia  Patient Self Care Activities:  . Patient will check blood glucose 3-4 times daily as directed , document, and provide at future appointments . Patient will focus on medication adherence by continuing to take medications as prescribed . Patient will take medications as prescribed . Patient will contact provider with any episodes of hypoglycemia . Patient will report any questions or concerns to provider   Initial goal documentation        Patient verbalizes understanding of instructions provided today.   Telephone follow up appointment with care management team member scheduled for:  06/30/19  [name], RN, BSN, CCM Care Management Coordinator [place] [facility]  Direct Phone: [phone]

## 2019-07-04 NOTE — Telephone Encounter (Signed)
The pt wanted to know if she could have her next appt virtually.  The pt was told that Dr. Bobetta Lime said depending on what the pt's issues are the patient may need to come in.  The pt can have her next appt virtually and would need to go to a [facility] in her town to have labs drawn.

## 2019-07-06 DIAGNOSIS — M5116 Intervertebral disc disorders with radiculopathy, lumbar region: Secondary | ICD-10-CM | POA: Diagnosis not present

## 2019-07-06 DIAGNOSIS — Z7982 Long term (current) use of aspirin: Secondary | ICD-10-CM | POA: Diagnosis not present

## 2019-07-06 DIAGNOSIS — K219 Gastro-esophageal reflux disease without esophagitis: Secondary | ICD-10-CM | POA: Diagnosis not present

## 2019-07-06 DIAGNOSIS — J159 Unspecified bacterial pneumonia: Secondary | ICD-10-CM | POA: Diagnosis not present

## 2019-07-06 DIAGNOSIS — M4316 Spondylolisthesis, lumbar region: Secondary | ICD-10-CM | POA: Diagnosis not present

## 2019-07-06 DIAGNOSIS — Z794 Long term (current) use of insulin: Secondary | ICD-10-CM | POA: Diagnosis not present

## 2019-07-06 DIAGNOSIS — E119 Type 2 diabetes mellitus without complications: Secondary | ICD-10-CM | POA: Diagnosis not present

## 2019-07-06 DIAGNOSIS — Z716 Tobacco abuse counseling: Secondary | ICD-10-CM | POA: Diagnosis not present

## 2019-07-06 DIAGNOSIS — I130 Hypertensive heart and chronic kidney disease with heart failure and stage 1 through stage 4 chronic kidney disease, or unspecified chronic kidney disease: Secondary | ICD-10-CM | POA: Diagnosis not present

## 2019-07-06 DIAGNOSIS — R0600 Dyspnea, unspecified: Secondary | ICD-10-CM | POA: Diagnosis not present

## 2019-07-06 DIAGNOSIS — Z6841 Body Mass Index (BMI) 40.0 and over, adult: Secondary | ICD-10-CM | POA: Diagnosis not present

## 2019-07-06 DIAGNOSIS — M1030 Gout due to renal impairment, unspecified site: Secondary | ICD-10-CM | POA: Diagnosis not present

## 2019-07-06 DIAGNOSIS — G4733 Obstructive sleep apnea (adult) (pediatric): Secondary | ICD-10-CM | POA: Diagnosis not present

## 2019-07-06 DIAGNOSIS — J9601 Acute respiratory failure with hypoxia: Secondary | ICD-10-CM | POA: Diagnosis not present

## 2019-07-06 DIAGNOSIS — F17210 Nicotine dependence, cigarettes, uncomplicated: Secondary | ICD-10-CM | POA: Diagnosis not present

## 2019-07-06 DIAGNOSIS — N1831 Chronic kidney disease, stage 3a: Secondary | ICD-10-CM | POA: Diagnosis not present

## 2019-07-06 DIAGNOSIS — E782 Mixed hyperlipidemia: Secondary | ICD-10-CM | POA: Diagnosis not present

## 2019-07-06 DIAGNOSIS — E1122 Type 2 diabetes mellitus with diabetic chronic kidney disease: Secondary | ICD-10-CM | POA: Diagnosis not present

## 2019-07-06 DIAGNOSIS — I5032 Chronic diastolic (congestive) heart failure: Secondary | ICD-10-CM | POA: Diagnosis not present

## 2019-07-06 DIAGNOSIS — M5136 Other intervertebral disc degeneration, lumbar region: Secondary | ICD-10-CM | POA: Diagnosis not present

## 2019-07-06 DIAGNOSIS — I10 Essential (primary) hypertension: Secondary | ICD-10-CM | POA: Diagnosis not present

## 2019-07-11 ENCOUNTER — Other Ambulatory Visit: Payer: Self-pay

## 2019-07-11 ENCOUNTER — Telehealth: Payer: Self-pay

## 2019-07-11 ENCOUNTER — Telehealth: Payer: Medicare Other | Admitting: Internal Medicine

## 2019-07-11 NOTE — Telephone Encounter (Signed)
PT CONSENT TO VIRTUAL VISIT 07/11/19

## 2019-07-11 NOTE — Telephone Encounter (Signed)
I returned the pt's call and left on the voicemail that Dr. Baird Cancer said thank you for the update because the pt's daughter in law shannon left a message that the pt was going to the ER via ambulance because the pt is having severe pain after getting injections for spinal stenopsis and that it isn't helping. I also left that Dr Baird Cancer said that the pt needed to contact her specialist.

## 2019-07-11 NOTE — Telephone Encounter (Signed)
I called the pt to let her know that her appt for today will be canceled because the pt had the ER visit today.

## 2019-07-11 NOTE — Telephone Encounter (Signed)
The pt was notified of her appt being rescheduled to Thursday at 4:30.  The pt said that she is currently admitted.  The pt was told to call the day before to reschedule if she is still admitted.

## 2019-07-12 DIAGNOSIS — E1122 Type 2 diabetes mellitus with diabetic chronic kidney disease: Secondary | ICD-10-CM | POA: Diagnosis not present

## 2019-07-12 DIAGNOSIS — M48061 Spinal stenosis, lumbar region without neurogenic claudication: Secondary | ICD-10-CM | POA: Diagnosis not present

## 2019-07-12 DIAGNOSIS — N1831 Chronic kidney disease, stage 3a: Secondary | ICD-10-CM | POA: Diagnosis not present

## 2019-07-12 DIAGNOSIS — Z720 Tobacco use: Secondary | ICD-10-CM | POA: Diagnosis not present

## 2019-07-12 DIAGNOSIS — Z6841 Body Mass Index (BMI) 40.0 and over, adult: Secondary | ICD-10-CM | POA: Diagnosis not present

## 2019-07-12 DIAGNOSIS — I5032 Chronic diastolic (congestive) heart failure: Secondary | ICD-10-CM | POA: Diagnosis not present

## 2019-07-12 DIAGNOSIS — Z9989 Dependence on other enabling machines and devices: Secondary | ICD-10-CM | POA: Diagnosis not present

## 2019-07-12 DIAGNOSIS — I130 Hypertensive heart and chronic kidney disease with heart failure and stage 1 through stage 4 chronic kidney disease, or unspecified chronic kidney disease: Secondary | ICD-10-CM | POA: Diagnosis not present

## 2019-07-12 DIAGNOSIS — Z794 Long term (current) use of insulin: Secondary | ICD-10-CM | POA: Diagnosis not present

## 2019-07-12 DIAGNOSIS — G4733 Obstructive sleep apnea (adult) (pediatric): Secondary | ICD-10-CM | POA: Diagnosis not present

## 2019-07-13 DIAGNOSIS — I130 Hypertensive heart and chronic kidney disease with heart failure and stage 1 through stage 4 chronic kidney disease, or unspecified chronic kidney disease: Secondary | ICD-10-CM | POA: Diagnosis not present

## 2019-07-13 DIAGNOSIS — I5032 Chronic diastolic (congestive) heart failure: Secondary | ICD-10-CM | POA: Diagnosis not present

## 2019-07-13 DIAGNOSIS — N1831 Chronic kidney disease, stage 3a: Secondary | ICD-10-CM | POA: Diagnosis not present

## 2019-07-13 DIAGNOSIS — E1122 Type 2 diabetes mellitus with diabetic chronic kidney disease: Secondary | ICD-10-CM | POA: Diagnosis not present

## 2019-07-13 DIAGNOSIS — M48061 Spinal stenosis, lumbar region without neurogenic claudication: Secondary | ICD-10-CM | POA: Diagnosis not present

## 2019-07-13 DIAGNOSIS — Z794 Long term (current) use of insulin: Secondary | ICD-10-CM | POA: Diagnosis not present

## 2019-07-13 DIAGNOSIS — Z9989 Dependence on other enabling machines and devices: Secondary | ICD-10-CM | POA: Diagnosis not present

## 2019-07-13 DIAGNOSIS — G4733 Obstructive sleep apnea (adult) (pediatric): Secondary | ICD-10-CM | POA: Diagnosis not present

## 2019-07-13 DIAGNOSIS — Z6841 Body Mass Index (BMI) 40.0 and over, adult: Secondary | ICD-10-CM | POA: Diagnosis not present

## 2019-07-13 DIAGNOSIS — Z720 Tobacco use: Secondary | ICD-10-CM | POA: Diagnosis not present

## 2019-07-14 ENCOUNTER — Telehealth: Payer: Medicare Other | Admitting: Internal Medicine

## 2019-07-14 ENCOUNTER — Other Ambulatory Visit: Payer: Self-pay

## 2019-07-14 ENCOUNTER — Telehealth: Payer: Self-pay

## 2019-07-14 DIAGNOSIS — I5032 Chronic diastolic (congestive) heart failure: Secondary | ICD-10-CM | POA: Diagnosis not present

## 2019-07-14 DIAGNOSIS — Z4789 Encounter for other orthopedic aftercare: Secondary | ICD-10-CM | POA: Diagnosis not present

## 2019-07-14 DIAGNOSIS — Z794 Long term (current) use of insulin: Secondary | ICD-10-CM | POA: Diagnosis not present

## 2019-07-14 DIAGNOSIS — Z9989 Dependence on other enabling machines and devices: Secondary | ICD-10-CM | POA: Diagnosis not present

## 2019-07-14 DIAGNOSIS — M4726 Other spondylosis with radiculopathy, lumbar region: Secondary | ICD-10-CM | POA: Diagnosis not present

## 2019-07-14 DIAGNOSIS — E1122 Type 2 diabetes mellitus with diabetic chronic kidney disease: Secondary | ICD-10-CM | POA: Diagnosis not present

## 2019-07-14 DIAGNOSIS — I130 Hypertensive heart and chronic kidney disease with heart failure and stage 1 through stage 4 chronic kidney disease, or unspecified chronic kidney disease: Secondary | ICD-10-CM | POA: Diagnosis not present

## 2019-07-14 DIAGNOSIS — R2689 Other abnormalities of gait and mobility: Secondary | ICD-10-CM | POA: Diagnosis not present

## 2019-07-14 DIAGNOSIS — G4733 Obstructive sleep apnea (adult) (pediatric): Secondary | ICD-10-CM | POA: Diagnosis not present

## 2019-07-14 DIAGNOSIS — M48061 Spinal stenosis, lumbar region without neurogenic claudication: Secondary | ICD-10-CM | POA: Diagnosis not present

## 2019-07-14 DIAGNOSIS — M5116 Intervertebral disc disorders with radiculopathy, lumbar region: Secondary | ICD-10-CM | POA: Diagnosis not present

## 2019-07-14 DIAGNOSIS — Z720 Tobacco use: Secondary | ICD-10-CM | POA: Diagnosis not present

## 2019-07-14 DIAGNOSIS — N1831 Chronic kidney disease, stage 3a: Secondary | ICD-10-CM | POA: Diagnosis not present

## 2019-07-14 DIAGNOSIS — M5136 Other intervertebral disc degeneration, lumbar region: Secondary | ICD-10-CM | POA: Diagnosis not present

## 2019-07-14 DIAGNOSIS — Z6841 Body Mass Index (BMI) 40.0 and over, adult: Secondary | ICD-10-CM | POA: Diagnosis not present

## 2019-07-14 NOTE — Telephone Encounter (Signed)
The pt was called to get her ready for her video visit and the pt is still admitted in the hospital.  The pt had surgery on her lumbar region and said that she will be discharged to rehab and will be in rehab for a few weeks.  The pt was told to call the office when she is discharged from the hospital.

## 2019-07-15 DIAGNOSIS — R279 Unspecified lack of coordination: Secondary | ICD-10-CM | POA: Diagnosis not present

## 2019-07-15 DIAGNOSIS — I130 Hypertensive heart and chronic kidney disease with heart failure and stage 1 through stage 4 chronic kidney disease, or unspecified chronic kidney disease: Secondary | ICD-10-CM | POA: Diagnosis not present

## 2019-07-15 DIAGNOSIS — Z6841 Body Mass Index (BMI) 40.0 and over, adult: Secondary | ICD-10-CM | POA: Diagnosis not present

## 2019-07-15 DIAGNOSIS — E1122 Type 2 diabetes mellitus with diabetic chronic kidney disease: Secondary | ICD-10-CM | POA: Diagnosis not present

## 2019-07-15 DIAGNOSIS — G4733 Obstructive sleep apnea (adult) (pediatric): Secondary | ICD-10-CM | POA: Diagnosis not present

## 2019-07-15 DIAGNOSIS — M48061 Spinal stenosis, lumbar region without neurogenic claudication: Secondary | ICD-10-CM | POA: Diagnosis not present

## 2019-07-15 DIAGNOSIS — Z794 Long term (current) use of insulin: Secondary | ICD-10-CM | POA: Diagnosis not present

## 2019-07-15 DIAGNOSIS — N1831 Chronic kidney disease, stage 3a: Secondary | ICD-10-CM | POA: Diagnosis not present

## 2019-07-15 DIAGNOSIS — R0902 Hypoxemia: Secondary | ICD-10-CM | POA: Diagnosis not present

## 2019-07-15 DIAGNOSIS — I5032 Chronic diastolic (congestive) heart failure: Secondary | ICD-10-CM | POA: Diagnosis not present

## 2019-07-15 DIAGNOSIS — Z720 Tobacco use: Secondary | ICD-10-CM | POA: Diagnosis not present

## 2019-07-15 DIAGNOSIS — Z9989 Dependence on other enabling machines and devices: Secondary | ICD-10-CM | POA: Diagnosis not present

## 2019-07-15 DIAGNOSIS — Z743 Need for continuous supervision: Secondary | ICD-10-CM | POA: Diagnosis not present

## 2019-07-18 DIAGNOSIS — Z9889 Other specified postprocedural states: Secondary | ICD-10-CM | POA: Diagnosis not present

## 2019-07-18 DIAGNOSIS — M48061 Spinal stenosis, lumbar region without neurogenic claudication: Secondary | ICD-10-CM | POA: Diagnosis not present

## 2019-07-18 DIAGNOSIS — E669 Obesity, unspecified: Secondary | ICD-10-CM | POA: Diagnosis not present

## 2019-07-18 DIAGNOSIS — I5032 Chronic diastolic (congestive) heart failure: Secondary | ICD-10-CM | POA: Diagnosis not present

## 2019-07-19 DIAGNOSIS — M48061 Spinal stenosis, lumbar region without neurogenic claudication: Secondary | ICD-10-CM | POA: Diagnosis not present

## 2019-07-19 DIAGNOSIS — E669 Obesity, unspecified: Secondary | ICD-10-CM | POA: Diagnosis not present

## 2019-07-19 DIAGNOSIS — Z9889 Other specified postprocedural states: Secondary | ICD-10-CM | POA: Diagnosis not present

## 2019-07-19 DIAGNOSIS — I5032 Chronic diastolic (congestive) heart failure: Secondary | ICD-10-CM | POA: Diagnosis not present

## 2019-07-21 DIAGNOSIS — J984 Other disorders of lung: Secondary | ICD-10-CM | POA: Diagnosis not present

## 2019-07-22 DIAGNOSIS — I5032 Chronic diastolic (congestive) heart failure: Secondary | ICD-10-CM | POA: Diagnosis not present

## 2019-07-22 DIAGNOSIS — Z9889 Other specified postprocedural states: Secondary | ICD-10-CM | POA: Diagnosis not present

## 2019-07-22 DIAGNOSIS — E669 Obesity, unspecified: Secondary | ICD-10-CM | POA: Diagnosis not present

## 2019-07-22 DIAGNOSIS — M48061 Spinal stenosis, lumbar region without neurogenic claudication: Secondary | ICD-10-CM | POA: Diagnosis not present

## 2019-07-25 DIAGNOSIS — I5032 Chronic diastolic (congestive) heart failure: Secondary | ICD-10-CM | POA: Diagnosis not present

## 2019-07-25 DIAGNOSIS — Z9889 Other specified postprocedural states: Secondary | ICD-10-CM | POA: Diagnosis not present

## 2019-07-25 DIAGNOSIS — E669 Obesity, unspecified: Secondary | ICD-10-CM | POA: Diagnosis not present

## 2019-07-25 DIAGNOSIS — M48061 Spinal stenosis, lumbar region without neurogenic claudication: Secondary | ICD-10-CM | POA: Diagnosis not present

## 2019-07-26 DIAGNOSIS — I5032 Chronic diastolic (congestive) heart failure: Secondary | ICD-10-CM | POA: Diagnosis not present

## 2019-07-26 DIAGNOSIS — E669 Obesity, unspecified: Secondary | ICD-10-CM | POA: Diagnosis not present

## 2019-07-26 DIAGNOSIS — Z9889 Other specified postprocedural states: Secondary | ICD-10-CM | POA: Diagnosis not present

## 2019-07-26 DIAGNOSIS — M48061 Spinal stenosis, lumbar region without neurogenic claudication: Secondary | ICD-10-CM | POA: Diagnosis not present

## 2019-07-27 DIAGNOSIS — M5126 Other intervertebral disc displacement, lumbar region: Secondary | ICD-10-CM | POA: Diagnosis not present

## 2019-07-27 DIAGNOSIS — Z794 Long term (current) use of insulin: Secondary | ICD-10-CM | POA: Diagnosis not present

## 2019-07-27 DIAGNOSIS — E669 Obesity, unspecified: Secondary | ICD-10-CM | POA: Diagnosis not present

## 2019-07-27 DIAGNOSIS — I5032 Chronic diastolic (congestive) heart failure: Secondary | ICD-10-CM | POA: Diagnosis present

## 2019-07-27 DIAGNOSIS — R29898 Other symptoms and signs involving the musculoskeletal system: Secondary | ICD-10-CM | POA: Diagnosis not present

## 2019-07-27 DIAGNOSIS — D72829 Elevated white blood cell count, unspecified: Secondary | ICD-10-CM | POA: Diagnosis not present

## 2019-07-27 DIAGNOSIS — J9811 Atelectasis: Secondary | ICD-10-CM | POA: Diagnosis not present

## 2019-07-27 DIAGNOSIS — F17210 Nicotine dependence, cigarettes, uncomplicated: Secondary | ICD-10-CM | POA: Diagnosis present

## 2019-07-27 DIAGNOSIS — K5900 Constipation, unspecified: Secondary | ICD-10-CM | POA: Diagnosis present

## 2019-07-27 DIAGNOSIS — Z888 Allergy status to other drugs, medicaments and biological substances status: Secondary | ICD-10-CM | POA: Diagnosis not present

## 2019-07-27 DIAGNOSIS — Y838 Other surgical procedures as the cause of abnormal reaction of the patient, or of later complication, without mention of misadventure at the time of the procedure: Secondary | ICD-10-CM | POA: Diagnosis not present

## 2019-07-27 DIAGNOSIS — Z9889 Other specified postprocedural states: Secondary | ICD-10-CM | POA: Diagnosis not present

## 2019-07-27 DIAGNOSIS — I130 Hypertensive heart and chronic kidney disease with heart failure and stage 1 through stage 4 chronic kidney disease, or unspecified chronic kidney disease: Secondary | ICD-10-CM | POA: Diagnosis not present

## 2019-07-27 DIAGNOSIS — R531 Weakness: Secondary | ICD-10-CM | POA: Diagnosis not present

## 2019-07-27 DIAGNOSIS — E785 Hyperlipidemia, unspecified: Secondary | ICD-10-CM | POA: Diagnosis present

## 2019-07-27 DIAGNOSIS — G834 Cauda equina syndrome: Secondary | ICD-10-CM | POA: Diagnosis not present

## 2019-07-27 DIAGNOSIS — G4733 Obstructive sleep apnea (adult) (pediatric): Secondary | ICD-10-CM | POA: Diagnosis present

## 2019-07-27 DIAGNOSIS — N1831 Chronic kidney disease, stage 3a: Secondary | ICD-10-CM | POA: Diagnosis present

## 2019-07-27 DIAGNOSIS — N179 Acute kidney failure, unspecified: Secondary | ICD-10-CM | POA: Diagnosis present

## 2019-07-27 DIAGNOSIS — E1122 Type 2 diabetes mellitus with diabetic chronic kidney disease: Secondary | ICD-10-CM | POA: Diagnosis present

## 2019-07-27 DIAGNOSIS — E1165 Type 2 diabetes mellitus with hyperglycemia: Secondary | ICD-10-CM | POA: Diagnosis present

## 2019-07-27 DIAGNOSIS — M79606 Pain in leg, unspecified: Secondary | ICD-10-CM | POA: Diagnosis not present

## 2019-07-27 DIAGNOSIS — Z8709 Personal history of other diseases of the respiratory system: Secondary | ICD-10-CM | POA: Diagnosis not present

## 2019-07-27 DIAGNOSIS — M47816 Spondylosis without myelopathy or radiculopathy, lumbar region: Secondary | ICD-10-CM | POA: Diagnosis not present

## 2019-07-27 DIAGNOSIS — G9782 Other postprocedural complications and disorders of nervous system: Secondary | ICD-10-CM | POA: Diagnosis not present

## 2019-07-27 DIAGNOSIS — Z6841 Body Mass Index (BMI) 40.0 and over, adult: Secondary | ICD-10-CM | POA: Diagnosis not present

## 2019-07-27 DIAGNOSIS — E860 Dehydration: Secondary | ICD-10-CM | POA: Diagnosis present

## 2019-07-27 DIAGNOSIS — R339 Retention of urine, unspecified: Secondary | ICD-10-CM | POA: Diagnosis present

## 2019-07-27 DIAGNOSIS — R0902 Hypoxemia: Secondary | ICD-10-CM | POA: Diagnosis not present

## 2019-07-27 DIAGNOSIS — R000 Tachycardia, unspecified: Secondary | ICD-10-CM | POA: Diagnosis not present

## 2019-07-27 DIAGNOSIS — M5136 Other intervertebral disc degeneration, lumbar region: Secondary | ICD-10-CM | POA: Diagnosis present

## 2019-07-27 DIAGNOSIS — M48061 Spinal stenosis, lumbar region without neurogenic claudication: Secondary | ICD-10-CM | POA: Diagnosis not present

## 2019-07-27 DIAGNOSIS — Z9989 Dependence on other enabling machines and devices: Secondary | ICD-10-CM | POA: Diagnosis not present

## 2019-07-27 DIAGNOSIS — M96842 Postprocedural seroma of a musculoskeletal structure following a musculoskeletal system procedure: Secondary | ICD-10-CM | POA: Diagnosis not present

## 2019-07-27 DIAGNOSIS — E119 Type 2 diabetes mellitus without complications: Secondary | ICD-10-CM | POA: Diagnosis not present

## 2019-07-27 DIAGNOSIS — Z91041 Radiographic dye allergy status: Secondary | ICD-10-CM | POA: Diagnosis not present

## 2019-07-27 DIAGNOSIS — R609 Edema, unspecified: Secondary | ICD-10-CM | POA: Diagnosis not present

## 2019-07-27 DIAGNOSIS — Z7982 Long term (current) use of aspirin: Secondary | ICD-10-CM | POA: Diagnosis not present

## 2019-07-27 DIAGNOSIS — E11649 Type 2 diabetes mellitus with hypoglycemia without coma: Secondary | ICD-10-CM | POA: Diagnosis not present

## 2019-07-27 DIAGNOSIS — G9763 Postprocedural seroma of a nervous system organ or structure following a nervous system procedure: Secondary | ICD-10-CM | POA: Diagnosis not present

## 2019-07-27 DIAGNOSIS — R0989 Other specified symptoms and signs involving the circulatory and respiratory systems: Secondary | ICD-10-CM | POA: Diagnosis not present

## 2019-07-27 DIAGNOSIS — I10 Essential (primary) hypertension: Secondary | ICD-10-CM | POA: Diagnosis not present

## 2019-07-27 DIAGNOSIS — J449 Chronic obstructive pulmonary disease, unspecified: Secondary | ICD-10-CM | POA: Diagnosis present

## 2019-07-27 DIAGNOSIS — R0602 Shortness of breath: Secondary | ICD-10-CM | POA: Diagnosis not present

## 2019-07-28 ENCOUNTER — Telehealth: Payer: Self-pay | Admitting: Internal Medicine

## 2019-07-28 DIAGNOSIS — G4733 Obstructive sleep apnea (adult) (pediatric): Secondary | ICD-10-CM | POA: Diagnosis not present

## 2019-07-28 DIAGNOSIS — M96842 Postprocedural seroma of a musculoskeletal structure following a musculoskeletal system procedure: Secondary | ICD-10-CM | POA: Diagnosis not present

## 2019-07-28 DIAGNOSIS — I5032 Chronic diastolic (congestive) heart failure: Secondary | ICD-10-CM | POA: Diagnosis not present

## 2019-07-28 DIAGNOSIS — Y838 Other surgical procedures as the cause of abnormal reaction of the patient, or of later complication, without mention of misadventure at the time of the procedure: Secondary | ICD-10-CM | POA: Diagnosis not present

## 2019-07-28 DIAGNOSIS — N1831 Chronic kidney disease, stage 3a: Secondary | ICD-10-CM | POA: Diagnosis not present

## 2019-07-28 DIAGNOSIS — Z6841 Body Mass Index (BMI) 40.0 and over, adult: Secondary | ICD-10-CM | POA: Diagnosis not present

## 2019-07-28 DIAGNOSIS — G9763 Postprocedural seroma of a nervous system organ or structure following a nervous system procedure: Secondary | ICD-10-CM | POA: Diagnosis not present

## 2019-07-28 DIAGNOSIS — I130 Hypertensive heart and chronic kidney disease with heart failure and stage 1 through stage 4 chronic kidney disease, or unspecified chronic kidney disease: Secondary | ICD-10-CM | POA: Diagnosis not present

## 2019-07-28 DIAGNOSIS — E1122 Type 2 diabetes mellitus with diabetic chronic kidney disease: Secondary | ICD-10-CM | POA: Diagnosis not present

## 2019-07-28 NOTE — Chronic Care Management (AMB) (Signed)
  Care Management   Note  07/28/2019 Name: SHAWNELL DYKES MRN: [id number] DOB: Jul 15, 1950  Anna Clay is a 69 y.o. year old female who is a primary care patient of Glendale Chard, MD and is actively engaged with the care management team. I reached out to Anna Clay by phone today to assist with re-scheduling a follow up visit with the Pharmacist.  Follow up plan: Unsuccessful telephone outreach attempt made.The care management team will reach out to the patient again over the next 7 days. If patient returns call to provider office, please advise to call [facility] at [phone].  [place], [place] [address] Direct Dial: [phone] [email or web address] Website: [email or web address]

## 2019-07-29 ENCOUNTER — Telehealth: Payer: Self-pay

## 2019-07-29 NOTE — Chronic Care Management (AMB) (Signed)
  Chronic Care Management   Note  07/29/2019 Name: Anna Clay MRN: [id number] DOB: October 04, 1950  Anna Clay is a 69 y.o. year old female who is a primary care patient of Glendale Chard, MD and is actively engaged with the care management team. I reached out to Anna Clay by phone today to assist with re-scheduling a follow up visit with the Pharmacist.  Follow up plan: Telephone appointment with care management team member scheduled for: 09/01/2019  [place], [place] [facility]  [place], [place] [address] Direct Dial: [email or web address] Website: [email or web address]

## 2019-08-01 ENCOUNTER — Telehealth: Payer: Self-pay

## 2019-08-01 NOTE — Telephone Encounter (Signed)
The pt was notified that Dr. Baird Cancer wanted to check on the pt to see how she is doing.  The pt said that she is slowly getting better, she had 2 falls and had to go back to the hospital, they did surgery on her at [facility] to drain infection from her surgery site.  The pt said that she doesn't know when she will be discharged.

## 2019-08-02 DIAGNOSIS — I5032 Chronic diastolic (congestive) heart failure: Secondary | ICD-10-CM | POA: Diagnosis not present

## 2019-08-02 DIAGNOSIS — M48061 Spinal stenosis, lumbar region without neurogenic claudication: Secondary | ICD-10-CM | POA: Diagnosis not present

## 2019-08-02 DIAGNOSIS — Z6841 Body Mass Index (BMI) 40.0 and over, adult: Secondary | ICD-10-CM | POA: Diagnosis not present

## 2019-08-02 DIAGNOSIS — M5441 Lumbago with sciatica, right side: Secondary | ICD-10-CM | POA: Diagnosis not present

## 2019-08-02 DIAGNOSIS — E7800 Pure hypercholesterolemia, unspecified: Secondary | ICD-10-CM | POA: Diagnosis not present

## 2019-08-02 DIAGNOSIS — G4733 Obstructive sleep apnea (adult) (pediatric): Secondary | ICD-10-CM | POA: Diagnosis not present

## 2019-08-02 DIAGNOSIS — E1142 Type 2 diabetes mellitus with diabetic polyneuropathy: Secondary | ICD-10-CM | POA: Diagnosis not present

## 2019-08-02 DIAGNOSIS — J9811 Atelectasis: Secondary | ICD-10-CM | POA: Diagnosis not present

## 2019-08-02 DIAGNOSIS — I50810 Right heart failure, unspecified: Secondary | ICD-10-CM | POA: Diagnosis not present

## 2019-08-02 DIAGNOSIS — J189 Pneumonia, unspecified organism: Secondary | ICD-10-CM | POA: Diagnosis not present

## 2019-08-02 DIAGNOSIS — Z794 Long term (current) use of insulin: Secondary | ICD-10-CM | POA: Diagnosis not present

## 2019-08-02 DIAGNOSIS — I130 Hypertensive heart and chronic kidney disease with heart failure and stage 1 through stage 4 chronic kidney disease, or unspecified chronic kidney disease: Secondary | ICD-10-CM | POA: Diagnosis not present

## 2019-08-02 DIAGNOSIS — Z4789 Encounter for other orthopedic aftercare: Secondary | ICD-10-CM | POA: Diagnosis not present

## 2019-08-02 DIAGNOSIS — D649 Anemia, unspecified: Secondary | ICD-10-CM | POA: Diagnosis not present

## 2019-08-02 DIAGNOSIS — M5442 Lumbago with sciatica, left side: Secondary | ICD-10-CM | POA: Diagnosis not present

## 2019-08-02 DIAGNOSIS — M109 Gout, unspecified: Secondary | ICD-10-CM | POA: Diagnosis not present

## 2019-08-02 DIAGNOSIS — M47816 Spondylosis without myelopathy or radiculopathy, lumbar region: Secondary | ICD-10-CM | POA: Diagnosis not present

## 2019-08-02 DIAGNOSIS — Z720 Tobacco use: Secondary | ICD-10-CM | POA: Diagnosis not present

## 2019-08-02 DIAGNOSIS — N1831 Chronic kidney disease, stage 3a: Secondary | ICD-10-CM | POA: Diagnosis not present

## 2019-08-02 DIAGNOSIS — J159 Unspecified bacterial pneumonia: Secondary | ICD-10-CM | POA: Diagnosis not present

## 2019-08-02 DIAGNOSIS — E1165 Type 2 diabetes mellitus with hyperglycemia: Secondary | ICD-10-CM | POA: Diagnosis not present

## 2019-08-02 DIAGNOSIS — J449 Chronic obstructive pulmonary disease, unspecified: Secondary | ICD-10-CM | POA: Diagnosis not present

## 2019-08-02 DIAGNOSIS — J9601 Acute respiratory failure with hypoxia: Secondary | ICD-10-CM | POA: Diagnosis not present

## 2019-08-02 DIAGNOSIS — I272 Pulmonary hypertension, unspecified: Secondary | ICD-10-CM | POA: Diagnosis not present

## 2019-08-05 ENCOUNTER — Telehealth: Payer: Self-pay

## 2019-08-08 ENCOUNTER — Ambulatory Visit: Payer: Medicare Other | Admitting: Internal Medicine

## 2019-08-08 ENCOUNTER — Telehealth: Payer: Self-pay

## 2019-08-08 NOTE — Telephone Encounter (Signed)
Transition Care Management Follow-up /Telephone Call  Date of discharge and from where: 08/07/2019 From [facility]  How have you been since you were released from the hospital? Tried Any questions or concerns? The pt said she wants to reschedule her telehealth visit that she had to cancel because she was in the hospital.  Items Reviewed:  Did the pt receive and understand the discharge instructions provided? Yes  Medications obtained and verified? Yes  Any new allergies since your discharge? No  Dietary orders reviewed? yes  Do you have support at home? yes Other (ie: DME, Home Health, etc) No Functional Questionnaire: (I = Independent and D = Dependent)  Bathing/Dressing- I   Meal Prep- D  Eating- I  Maintaining continence- I  Transferring/Ambulation- D  Managing Meds- I   Follow up appointments reviewed:    PCP Hospital f/u appt confirmed? Yes 08/16/2019 @ 4?45 pm with Laurance Flatten, [facility] f/u appt confirmed?n/a  Are transportation arrangements needed?No  If their condition worsens, is the pt aware to call  their PCP or go to the ED? yes  Was the patient provided with contact information for the PCP's office or ED? yes Was the pt encouraged to call back with questions or concerns? Yes

## 2019-08-08 NOTE — Telephone Encounter (Signed)
The pt was scheduled for a mychart video visit at the pt's request.

## 2019-08-09 DIAGNOSIS — M1990 Unspecified osteoarthritis, unspecified site: Secondary | ICD-10-CM | POA: Diagnosis not present

## 2019-08-09 DIAGNOSIS — M5136 Other intervertebral disc degeneration, lumbar region: Secondary | ICD-10-CM | POA: Diagnosis not present

## 2019-08-09 DIAGNOSIS — J449 Chronic obstructive pulmonary disease, unspecified: Secondary | ICD-10-CM | POA: Diagnosis not present

## 2019-08-09 DIAGNOSIS — M4726 Other spondylosis with radiculopathy, lumbar region: Secondary | ICD-10-CM | POA: Diagnosis not present

## 2019-08-09 DIAGNOSIS — Z4789 Encounter for other orthopedic aftercare: Secondary | ICD-10-CM | POA: Diagnosis not present

## 2019-08-09 DIAGNOSIS — E785 Hyperlipidemia, unspecified: Secondary | ICD-10-CM | POA: Diagnosis not present

## 2019-08-09 DIAGNOSIS — F17210 Nicotine dependence, cigarettes, uncomplicated: Secondary | ICD-10-CM | POA: Diagnosis not present

## 2019-08-09 DIAGNOSIS — M48061 Spinal stenosis, lumbar region without neurogenic claudication: Secondary | ICD-10-CM | POA: Diagnosis not present

## 2019-08-09 DIAGNOSIS — M1030 Gout due to renal impairment, unspecified site: Secondary | ICD-10-CM | POA: Diagnosis not present

## 2019-08-09 DIAGNOSIS — N1831 Chronic kidney disease, stage 3a: Secondary | ICD-10-CM | POA: Diagnosis not present

## 2019-08-09 DIAGNOSIS — Z7982 Long term (current) use of aspirin: Secondary | ICD-10-CM | POA: Diagnosis not present

## 2019-08-09 DIAGNOSIS — Z6841 Body Mass Index (BMI) 40.0 and over, adult: Secondary | ICD-10-CM | POA: Diagnosis not present

## 2019-08-09 DIAGNOSIS — I130 Hypertensive heart and chronic kidney disease with heart failure and stage 1 through stage 4 chronic kidney disease, or unspecified chronic kidney disease: Secondary | ICD-10-CM | POA: Diagnosis not present

## 2019-08-09 DIAGNOSIS — E1122 Type 2 diabetes mellitus with diabetic chronic kidney disease: Secondary | ICD-10-CM | POA: Diagnosis not present

## 2019-08-09 DIAGNOSIS — E1140 Type 2 diabetes mellitus with diabetic neuropathy, unspecified: Secondary | ICD-10-CM | POA: Diagnosis not present

## 2019-08-09 DIAGNOSIS — R252 Cramp and spasm: Secondary | ICD-10-CM | POA: Diagnosis not present

## 2019-08-09 DIAGNOSIS — R2689 Other abnormalities of gait and mobility: Secondary | ICD-10-CM | POA: Diagnosis not present

## 2019-08-09 DIAGNOSIS — M5116 Intervertebral disc disorders with radiculopathy, lumbar region: Secondary | ICD-10-CM | POA: Diagnosis not present

## 2019-08-09 DIAGNOSIS — I5032 Chronic diastolic (congestive) heart failure: Secondary | ICD-10-CM | POA: Diagnosis not present

## 2019-08-09 DIAGNOSIS — Z79891 Long term (current) use of opiate analgesic: Secondary | ICD-10-CM | POA: Diagnosis not present

## 2019-08-09 DIAGNOSIS — G4733 Obstructive sleep apnea (adult) (pediatric): Secondary | ICD-10-CM | POA: Diagnosis not present

## 2019-08-09 DIAGNOSIS — M4727 Other spondylosis with radiculopathy, lumbosacral region: Secondary | ICD-10-CM | POA: Diagnosis not present

## 2019-08-09 DIAGNOSIS — Z794 Long term (current) use of insulin: Secondary | ICD-10-CM | POA: Diagnosis not present

## 2019-08-09 DIAGNOSIS — E1165 Type 2 diabetes mellitus with hyperglycemia: Secondary | ICD-10-CM | POA: Diagnosis not present

## 2019-08-11 DIAGNOSIS — M5136 Other intervertebral disc degeneration, lumbar region: Secondary | ICD-10-CM | POA: Diagnosis not present

## 2019-08-11 DIAGNOSIS — Z4789 Encounter for other orthopedic aftercare: Secondary | ICD-10-CM | POA: Diagnosis not present

## 2019-08-11 DIAGNOSIS — M48061 Spinal stenosis, lumbar region without neurogenic claudication: Secondary | ICD-10-CM | POA: Diagnosis not present

## 2019-08-11 DIAGNOSIS — M4726 Other spondylosis with radiculopathy, lumbar region: Secondary | ICD-10-CM | POA: Diagnosis not present

## 2019-08-11 DIAGNOSIS — E1165 Type 2 diabetes mellitus with hyperglycemia: Secondary | ICD-10-CM | POA: Diagnosis not present

## 2019-08-11 DIAGNOSIS — I130 Hypertensive heart and chronic kidney disease with heart failure and stage 1 through stage 4 chronic kidney disease, or unspecified chronic kidney disease: Secondary | ICD-10-CM | POA: Diagnosis not present

## 2019-08-12 ENCOUNTER — Telehealth: Payer: Self-pay

## 2019-08-13 ENCOUNTER — Other Ambulatory Visit: Payer: Self-pay | Admitting: Internal Medicine

## 2019-08-15 DIAGNOSIS — S3210XA Unspecified fracture of sacrum, initial encounter for closed fracture: Secondary | ICD-10-CM | POA: Diagnosis not present

## 2019-08-15 DIAGNOSIS — E1140 Type 2 diabetes mellitus with diabetic neuropathy, unspecified: Secondary | ICD-10-CM | POA: Diagnosis present

## 2019-08-15 DIAGNOSIS — I2699 Other pulmonary embolism without acute cor pulmonale: Secondary | ICD-10-CM | POA: Diagnosis not present

## 2019-08-15 DIAGNOSIS — I959 Hypotension, unspecified: Secondary | ICD-10-CM | POA: Diagnosis not present

## 2019-08-15 DIAGNOSIS — R0989 Other specified symptoms and signs involving the circulatory and respiratory systems: Secondary | ICD-10-CM | POA: Diagnosis not present

## 2019-08-15 DIAGNOSIS — L03114 Cellulitis of left upper limb: Secondary | ICD-10-CM | POA: Diagnosis not present

## 2019-08-15 DIAGNOSIS — M47816 Spondylosis without myelopathy or radiculopathy, lumbar region: Secondary | ICD-10-CM | POA: Diagnosis present

## 2019-08-15 DIAGNOSIS — R600 Localized edema: Secondary | ICD-10-CM | POA: Diagnosis not present

## 2019-08-15 DIAGNOSIS — E119 Type 2 diabetes mellitus without complications: Secondary | ICD-10-CM | POA: Diagnosis not present

## 2019-08-15 DIAGNOSIS — M545 Low back pain: Secondary | ICD-10-CM | POA: Diagnosis not present

## 2019-08-15 DIAGNOSIS — Z6841 Body Mass Index (BMI) 40.0 and over, adult: Secondary | ICD-10-CM | POA: Diagnosis not present

## 2019-08-15 DIAGNOSIS — E875 Hyperkalemia: Secondary | ICD-10-CM | POA: Diagnosis present

## 2019-08-15 DIAGNOSIS — J811 Chronic pulmonary edema: Secondary | ICD-10-CM | POA: Diagnosis not present

## 2019-08-15 DIAGNOSIS — R0902 Hypoxemia: Secondary | ICD-10-CM | POA: Diagnosis not present

## 2019-08-15 DIAGNOSIS — Z794 Long term (current) use of insulin: Secondary | ICD-10-CM | POA: Diagnosis not present

## 2019-08-15 DIAGNOSIS — E669 Obesity, unspecified: Secondary | ICD-10-CM | POA: Diagnosis present

## 2019-08-15 DIAGNOSIS — N390 Urinary tract infection, site not specified: Secondary | ICD-10-CM | POA: Diagnosis not present

## 2019-08-15 DIAGNOSIS — R0602 Shortness of breath: Secondary | ICD-10-CM | POA: Diagnosis not present

## 2019-08-15 DIAGNOSIS — E559 Vitamin D deficiency, unspecified: Secondary | ICD-10-CM | POA: Diagnosis not present

## 2019-08-15 DIAGNOSIS — M79602 Pain in left arm: Secondary | ICD-10-CM | POA: Diagnosis not present

## 2019-08-15 DIAGNOSIS — I5032 Chronic diastolic (congestive) heart failure: Secondary | ICD-10-CM | POA: Diagnosis not present

## 2019-08-15 DIAGNOSIS — I82443 Acute embolism and thrombosis of tibial vein, bilateral: Secondary | ICD-10-CM | POA: Diagnosis not present

## 2019-08-15 DIAGNOSIS — F05 Delirium due to known physiological condition: Secondary | ICD-10-CM | POA: Diagnosis not present

## 2019-08-15 DIAGNOSIS — K219 Gastro-esophageal reflux disease without esophagitis: Secondary | ICD-10-CM | POA: Diagnosis not present

## 2019-08-15 DIAGNOSIS — J80 Acute respiratory distress syndrome: Secondary | ICD-10-CM | POA: Diagnosis not present

## 2019-08-15 DIAGNOSIS — D7582 Heparin induced thrombocytopenia (HIT): Secondary | ICD-10-CM | POA: Diagnosis present

## 2019-08-15 DIAGNOSIS — M109 Gout, unspecified: Secondary | ICD-10-CM | POA: Diagnosis not present

## 2019-08-15 DIAGNOSIS — R0600 Dyspnea, unspecified: Secondary | ICD-10-CM | POA: Diagnosis not present

## 2019-08-15 DIAGNOSIS — I2609 Other pulmonary embolism with acute cor pulmonale: Secondary | ICD-10-CM | POA: Diagnosis not present

## 2019-08-15 DIAGNOSIS — N179 Acute kidney failure, unspecified: Secondary | ICD-10-CM | POA: Diagnosis not present

## 2019-08-15 DIAGNOSIS — Z20822 Contact with and (suspected) exposure to covid-19: Secondary | ICD-10-CM | POA: Diagnosis not present

## 2019-08-15 DIAGNOSIS — M6281 Muscle weakness (generalized): Secondary | ICD-10-CM | POA: Diagnosis not present

## 2019-08-15 DIAGNOSIS — R7989 Other specified abnormal findings of blood chemistry: Secondary | ICD-10-CM | POA: Diagnosis not present

## 2019-08-15 DIAGNOSIS — R339 Retention of urine, unspecified: Secondary | ICD-10-CM | POA: Diagnosis not present

## 2019-08-15 DIAGNOSIS — Z23 Encounter for immunization: Secondary | ICD-10-CM | POA: Diagnosis not present

## 2019-08-15 DIAGNOSIS — J9600 Acute respiratory failure, unspecified whether with hypoxia or hypercapnia: Secondary | ICD-10-CM | POA: Diagnosis not present

## 2019-08-15 DIAGNOSIS — J9811 Atelectasis: Secondary | ICD-10-CM | POA: Diagnosis not present

## 2019-08-15 DIAGNOSIS — I082 Rheumatic disorders of both aortic and tricuspid valves: Secondary | ICD-10-CM | POA: Diagnosis not present

## 2019-08-15 DIAGNOSIS — I82403 Acute embolism and thrombosis of unspecified deep veins of lower extremity, bilateral: Secondary | ICD-10-CM | POA: Diagnosis not present

## 2019-08-15 DIAGNOSIS — Z4789 Encounter for other orthopedic aftercare: Secondary | ICD-10-CM | POA: Diagnosis not present

## 2019-08-15 DIAGNOSIS — J189 Pneumonia, unspecified organism: Secondary | ICD-10-CM | POA: Diagnosis not present

## 2019-08-15 DIAGNOSIS — E785 Hyperlipidemia, unspecified: Secondary | ICD-10-CM | POA: Diagnosis not present

## 2019-08-15 DIAGNOSIS — M5116 Intervertebral disc disorders with radiculopathy, lumbar region: Secondary | ICD-10-CM | POA: Diagnosis not present

## 2019-08-15 DIAGNOSIS — S3210XD Unspecified fracture of sacrum, subsequent encounter for fracture with routine healing: Secondary | ICD-10-CM | POA: Diagnosis not present

## 2019-08-15 DIAGNOSIS — I272 Pulmonary hypertension, unspecified: Secondary | ICD-10-CM | POA: Diagnosis not present

## 2019-08-15 DIAGNOSIS — E441 Mild protein-calorie malnutrition: Secondary | ICD-10-CM | POA: Diagnosis not present

## 2019-08-15 DIAGNOSIS — J9601 Acute respiratory failure with hypoxia: Secondary | ICD-10-CM | POA: Diagnosis present

## 2019-08-15 DIAGNOSIS — M5136 Other intervertebral disc degeneration, lumbar region: Secondary | ICD-10-CM | POA: Diagnosis not present

## 2019-08-15 DIAGNOSIS — Z7401 Bed confinement status: Secondary | ICD-10-CM | POA: Diagnosis not present

## 2019-08-15 DIAGNOSIS — I361 Nonrheumatic tricuspid (valve) insufficiency: Secondary | ICD-10-CM | POA: Diagnosis not present

## 2019-08-15 DIAGNOSIS — W108XXA Fall (on) (from) other stairs and steps, initial encounter: Secondary | ICD-10-CM | POA: Diagnosis not present

## 2019-08-15 DIAGNOSIS — R41841 Cognitive communication deficit: Secondary | ICD-10-CM | POA: Diagnosis not present

## 2019-08-15 DIAGNOSIS — I5033 Acute on chronic diastolic (congestive) heart failure: Secondary | ICD-10-CM | POA: Diagnosis present

## 2019-08-15 DIAGNOSIS — I517 Cardiomegaly: Secondary | ICD-10-CM | POA: Diagnosis not present

## 2019-08-15 DIAGNOSIS — I82433 Acute embolism and thrombosis of popliteal vein, bilateral: Secondary | ICD-10-CM | POA: Diagnosis not present

## 2019-08-15 DIAGNOSIS — I10 Essential (primary) hypertension: Secondary | ICD-10-CM | POA: Diagnosis not present

## 2019-08-15 DIAGNOSIS — I82413 Acute embolism and thrombosis of femoral vein, bilateral: Secondary | ICD-10-CM | POA: Diagnosis not present

## 2019-08-15 DIAGNOSIS — I130 Hypertensive heart and chronic kidney disease with heart failure and stage 1 through stage 4 chronic kidney disease, or unspecified chronic kidney disease: Secondary | ICD-10-CM | POA: Diagnosis present

## 2019-08-15 DIAGNOSIS — M2550 Pain in unspecified joint: Secondary | ICD-10-CM | POA: Diagnosis not present

## 2019-08-15 DIAGNOSIS — R52 Pain, unspecified: Secondary | ICD-10-CM | POA: Diagnosis not present

## 2019-08-15 DIAGNOSIS — Z043 Encounter for examination and observation following other accident: Secondary | ICD-10-CM | POA: Diagnosis not present

## 2019-08-15 DIAGNOSIS — I071 Rheumatic tricuspid insufficiency: Secondary | ICD-10-CM | POA: Diagnosis not present

## 2019-08-15 DIAGNOSIS — M48061 Spinal stenosis, lumbar region without neurogenic claudication: Secondary | ICD-10-CM | POA: Diagnosis not present

## 2019-08-15 DIAGNOSIS — E1122 Type 2 diabetes mellitus with diabetic chronic kidney disease: Secondary | ICD-10-CM | POA: Diagnosis present

## 2019-08-15 DIAGNOSIS — I081 Rheumatic disorders of both mitral and tricuspid valves: Secondary | ICD-10-CM | POA: Diagnosis not present

## 2019-08-15 DIAGNOSIS — R1310 Dysphagia, unspecified: Secondary | ICD-10-CM | POA: Diagnosis not present

## 2019-08-15 DIAGNOSIS — N184 Chronic kidney disease, stage 4 (severe): Secondary | ICD-10-CM | POA: Diagnosis not present

## 2019-08-15 DIAGNOSIS — G4730 Sleep apnea, unspecified: Secondary | ICD-10-CM | POA: Diagnosis not present

## 2019-08-15 DIAGNOSIS — J439 Emphysema, unspecified: Secondary | ICD-10-CM | POA: Diagnosis not present

## 2019-08-15 DIAGNOSIS — M4726 Other spondylosis with radiculopathy, lumbar region: Secondary | ICD-10-CM | POA: Diagnosis not present

## 2019-08-15 DIAGNOSIS — R2689 Other abnormalities of gait and mobility: Secondary | ICD-10-CM | POA: Diagnosis not present

## 2019-08-15 DIAGNOSIS — R918 Other nonspecific abnormal finding of lung field: Secondary | ICD-10-CM | POA: Diagnosis not present

## 2019-08-16 ENCOUNTER — Telehealth: Payer: Self-pay | Admitting: Nurse Practitioner

## 2019-08-17 NOTE — Chronic Care Management (AMB) (Addendum)
08/12/2019 Name: Anna Clay MRN: [id number] DOB: 1950-04-21 Anna Clay is a 69 y.o. year old female who is a primary care patient of Glendale Chard, MD. Reviewed chart prior to disease state call. Spoke with patient regarding BP and recent hospital visit.  Recent Office Vitals: BP Readings from Last 3 Encounters:  06/21/19 (!) 146/88  04/06/19 138/78  04/06/19 138/78   Pulse Readings from Last 3 Encounters:  06/21/19 94  04/06/19 98  04/06/19 98    Wt Readings from Last 3 Encounters:  06/21/19 247 lb 3.2 oz (112.1 kg)  04/06/19 246 lb 9.6 oz (111.9 kg)  04/06/19 246 lb 9.6 oz (111.9 kg)     Kidney Function Lab Results  Component Value Date/Time   CREATININE 1.37 (H) 04/06/2019 05:07 PM   CREATININE 1.14 (H) 12/29/2018 04:49 PM   CREATININE 1.4 (H) 12/05/2016 08:13 AM   CREATININE 1.4 (H) 10/24/2016 08:30 AM   GFRNONAA 40 (L) 04/06/2019 05:07 PM   GFRAA 46 (L) 04/06/2019 05:07 PM    BMP Latest Ref Rng & Units 04/06/2019 12/29/2018 08/18/2018  Glucose 65 - 99 mg/dL 70 90 120(H)  BUN 8 - 27 mg/dL 27 26 34(H)  Creatinine 0.57 - 1.00 mg/dL 1.37(H) 1.14(H) 1.32(H)  BUN/Creat Ratio 12 - 28 20 23 26   Sodium 134 - 144 mmol/L 139 138 138  Potassium 3.5 - 5.2 mmol/L 4.6 4.1 4.5  Chloride 96 - 106 mmol/L 99 98 97  CO2 20 - 29 mmol/L 26 25 24   Calcium 8.7 - 10.3 mg/dL 10.0 10.1 10.4(H)    Current antihypertensive regimen:  Amlodipine 5mg  take 1 tablet daily Metoprolol succinate 25mg  take 1 tablet daily Spironolactone 25mg  take 1 tablet daily Torsemide 20mg  take 1 tablet daily How often are you checking your Blood Pressure?  Patient is not checking blood pressures. Current home BP readings: none. Patient is not checking.  What recent interventions/DTPs have been made by any provider to improve Blood Pressure control since last CPP Visit: Per last CCM visit with [name] on 06/16/2019:  Hypertension Pharmacist Clinical Goal(s): Over the next 90 days, patient will work  with PharmD and providers to achieve BP goal <130/80 Current regimen:  Amlodipine 10mg  daily Interventions: Will discuss hypertensive regimen with PCP following nurse BP check on 6/1 Patient self care activities - Over the next 90 days, patient will: Check BP once, document, and provide at future appointments Ensure daily salt intake < 2300 mg/day Any recent hospitalizations or ED visits since last visit [facility] What diet changes have been made to improve Blood Pressure Control?  None at this time, she is feeling groggy from recent increase of Gabapentin that she takes every 8 hours so she is sleeping a lot.  What exercise is being done to improve your Blood Pressure Control?  None, patient is c/o bilateral leg swelling and feet swelling- communicated with [name], CPP.  Adherence Review: Is the patient currently on ACE/ARB medication? No Does the patient have >5 day gap between last estimated fill dates? Yes due to patient being admitted to the hospital off and on for 1 month.   Juliane Poot   08/12/2019 Name: Anna Clay MRN: [id number] DOB: Sep 25, 1950 Anna Clay is a 69 y.o. year old female who is a primary care patient of Glendale Chard, MD.   Recent Relevant Labs: Lab Results  Component Value Date/Time   HGBA1C 6.9 (H) 04/06/2019 05:07 PM   HGBA1C 7.0 (  H) 12/29/2018 04:49 PM   MICROALBUR 150 04/06/2019 04:18 PM   MICROALBUR 150 02/17/2018 04:35 PM    Kidney Function Lab Results  Component Value Date/Time   CREATININE 1.37 (H) 04/06/2019 05:07 PM   CREATININE 1.14 (H) 12/29/2018 04:49 PM   CREATININE 1.4 (H) 12/05/2016 08:13 AM   CREATININE 1.4 (H) 10/24/2016 08:30 AM   GFRNONAA 40 (L) 04/06/2019 05:07 PM   GFRAA 46 (L) 04/06/2019 05:07 PM    Current antihyperglycemic regimen:  Toujeo- Inject 25U at bedtime Humalog 10u in the morning, 10u at lunch and 14u at dinner. What recent interventions/DTPs have been made to  improve glycemic control:  Per last CCM visit with [name], CPP on 06/16/2019- Diabetes Pharmacist Clinical Goal(s): Over the next 90 days, patient will work with PharmD and providers to maintain A1c goal <7% Current regimen:  Toujeo 58 units at bedtime Humalog Kwikpen 6 units at breakfast, 6 units at lunch, 8 units at dinner Interventions: Will investigate CGM coverage and options on insurance Patient self care activities - Over the next 90 days, patient will: Check blood sugar 3-4 times daily, document, and provide at future appointments Contact provider with any episodes of hypoglycemia Have there been any recent hospitalizations or ED visits since last visit with CPP? Yes Patient denies vomiting, abdominal pain, fussiness, diarrhea, cough and difficulty breathing hypoglycemic symptoms, including Pale, Sweaty, Shaky, Hungry, Nervous/irritable and Vision changes Patient denies vomiting, abdominal pain, fussiness, diarrhea, cough and difficulty breathing hyperglycemic symptoms, including blurry vision, excessive thirst, fatigue, polyuria and weakness How often are you checking your blood sugar? once daily but has not checked recently. Last BS check was on 08/07/2019 was at 228, pt not sure if fasting blood sugar or not.  What are your blood sugars ranging?  Fasting: Patient is not checking regularly. Before meals: Patient is not checking regularly. After meals: Patient is not checking regularly. Bedtime: Patient is not checking regularly. During the week, how often does your blood glucose drop below 70?  Patient is not checking blood sugars regularly. Are you checking your feet daily/regularly? Yes, patient c/o of bilateral foot pain due to swelling, CPP [name] was notified.   Adherence Review: Is the patient currently on a STATIN medication? Yes- Pitavastatin 4mg  taking 1 tablet at bedtime. Is the patient currently on ACE/ARB medication? No Does the patient have >5 day  gap between last estimated fill dates? Yes- due to being in the hospital off and on for about 1 month.    Juliane Poot   Reviewed chart for medication changes ahead of medication coordination call.  OVs, Consults, or hospital visits since last care coordination call/Pharmacist visit.  Hospital admission 07/11/2019 [facility] , Readmission on 07/27/19.   Medication changes indicated OR if recent visit, treatment plan here. 07/27/2019 Hospital discharge summary: Avoid extremes of overhead work or extremes of bending, stooping, or twisting for one month. Avoid lifting over 10 pounds for one month. You may resume driving in 3 days if you are not taking pain medications prior to driving and feel confident in driving, otherwise.  You may shower on postop day 2 but do not get your incision wet.   Keep the incision clean and dry at all times for one week. Cover it in the shower for one week. After 1 week from surgery remove the paper tape/Steri-Strips and you may get your incision wet.  Percocet (Oxycodone/Acetaminophen) 5-10/325mg  may be taken every 6 hours as needed for pain. This medication can  cause constipation along with additional side effects to include (but are not limited to) respiratory depression, sedation, dependence, and even death. Monitor for over sedation. Do not drive while taking pain medication. Each tablet of your pain medication contains 325mg  of Tylenol/acetaminophen. Be sure you do not exceed the daily maximum amount of Tylenol/acetaminophen per day which is 3000mg .  We will also send you home with Flexeril 10mg  TID which is a muscle relaxant which you can take up to three to four times a day as needed for muscle spasms. This medication is also sedating and drowsy so do not drive while taking it.   I would also advise you to take over the counter Colace stool softener 100mg  twice daily if you are taking pain medication regularly to help prevent opioid induced  constipation and prevent postoperative ileus (slowing of your bowels). You may additionally take Miralax 17g once daily. Once you wean down off of your pain medication or if you do not have any trouble with constipation you may decrease your bowel medications.  If you were taking aspirin prior to surgery you may resume it when you go home.   Follow-up with NP at 2 weeks and Dr. Cathren Laine at 6-8 weeks.  We will schedule your second postop visit at your first postop visit.   BP Readings from Last 3 Encounters:  06/21/19 (!) 146/88  04/06/19 138/78  04/06/19 138/78    Lab Results  Component Value Date   HGBA1C 6.9 (H) 04/06/2019     Patient obtains medications through Adherence Packaging  90 Days   Last adherence delivery included: (medication name and frequency) Toujeo 300u/ml inject 58u sq daily Novofine Needles use 6-8 needs per day Pantoprazole 40mg  one tablet daily Gabapentin 100mg  one capsule three times a day Nitroglycerin 0.4mg  dissolve one tablet under tongue as needed for chest pains. Torsemide 20mg  one tablet by mouth daily Amlodipine 5mg  take two tablets by mouth daily Cyclobenzaprine 10mg  take one tablet by mouth daily as needed  Allopurinol 100mg  take one tablet daily Spironolactone 25mg  take one tablet daily Livalo 4mg  take one tablet at bedtime   Patient declined (meds) last month due to PRN use/additional supply on hand. none Explanation of abundance on hand (ie #30 due to overlapping fills or previous adherence issues etc) none  Patient is due for next adherence delivery on: 08/20/2019. Called patient and reviewed medications and coordinated delivery. Holding on delivery and reviewing medication due to urgent complaints from patient. Patient c/o Back and leg pain and swelling. Just d/c from [facility] had a Laminectomy. Patient is currently feeling bloated and complains of bilateral leg swelling and feet swelling. Patient states it hurts to walk because of  swollen feet. Patient also c/o memory loss and getting distracted easily, not having an appetite.  This information was immediately relayed to [name] who spoke with Dr. Glendale Chard and patient was contacted same day.  Will f/u when patient is out of the hospital.

## 2019-08-28 DIAGNOSIS — M5136 Other intervertebral disc degeneration, lumbar region: Secondary | ICD-10-CM | POA: Diagnosis not present

## 2019-08-28 DIAGNOSIS — R2689 Other abnormalities of gait and mobility: Secondary | ICD-10-CM | POA: Diagnosis not present

## 2019-08-28 DIAGNOSIS — M48061 Spinal stenosis, lumbar region without neurogenic claudication: Secondary | ICD-10-CM | POA: Diagnosis not present

## 2019-08-28 DIAGNOSIS — M4726 Other spondylosis with radiculopathy, lumbar region: Secondary | ICD-10-CM | POA: Diagnosis not present

## 2019-08-28 DIAGNOSIS — M5116 Intervertebral disc disorders with radiculopathy, lumbar region: Secondary | ICD-10-CM | POA: Diagnosis not present

## 2019-08-28 DIAGNOSIS — Z4789 Encounter for other orthopedic aftercare: Secondary | ICD-10-CM | POA: Diagnosis not present

## 2019-09-01 ENCOUNTER — Telehealth: Payer: Self-pay

## 2019-09-06 DIAGNOSIS — J069 Acute upper respiratory infection, unspecified: Secondary | ICD-10-CM | POA: Diagnosis not present

## 2019-09-06 DIAGNOSIS — I2699 Other pulmonary embolism without acute cor pulmonale: Secondary | ICD-10-CM | POA: Diagnosis not present

## 2019-09-06 DIAGNOSIS — M5136 Other intervertebral disc degeneration, lumbar region: Secondary | ICD-10-CM | POA: Diagnosis not present

## 2019-09-06 DIAGNOSIS — S3210XD Unspecified fracture of sacrum, subsequent encounter for fracture with routine healing: Secondary | ICD-10-CM | POA: Diagnosis not present

## 2019-09-06 DIAGNOSIS — R0989 Other specified symptoms and signs involving the circulatory and respiratory systems: Secondary | ICD-10-CM | POA: Diagnosis not present

## 2019-09-06 DIAGNOSIS — I5032 Chronic diastolic (congestive) heart failure: Secondary | ICD-10-CM | POA: Diagnosis not present

## 2019-09-06 DIAGNOSIS — J439 Emphysema, unspecified: Secondary | ICD-10-CM | POA: Diagnosis not present

## 2019-09-06 DIAGNOSIS — M2550 Pain in unspecified joint: Secondary | ICD-10-CM | POA: Diagnosis not present

## 2019-09-06 DIAGNOSIS — E782 Mixed hyperlipidemia: Secondary | ICD-10-CM | POA: Diagnosis not present

## 2019-09-06 DIAGNOSIS — R062 Wheezing: Secondary | ICD-10-CM | POA: Diagnosis not present

## 2019-09-06 DIAGNOSIS — I5020 Unspecified systolic (congestive) heart failure: Secondary | ICD-10-CM | POA: Diagnosis not present

## 2019-09-06 DIAGNOSIS — N1831 Chronic kidney disease, stage 3a: Secondary | ICD-10-CM | POA: Diagnosis not present

## 2019-09-06 DIAGNOSIS — R0602 Shortness of breath: Secondary | ICD-10-CM | POA: Diagnosis not present

## 2019-09-06 DIAGNOSIS — R41841 Cognitive communication deficit: Secondary | ICD-10-CM | POA: Diagnosis not present

## 2019-09-06 DIAGNOSIS — D7582 Heparin induced thrombocytopenia (HIT): Secondary | ICD-10-CM | POA: Diagnosis not present

## 2019-09-06 DIAGNOSIS — R05 Cough: Secondary | ICD-10-CM | POA: Diagnosis not present

## 2019-09-06 DIAGNOSIS — M4726 Other spondylosis with radiculopathy, lumbar region: Secondary | ICD-10-CM | POA: Diagnosis not present

## 2019-09-06 DIAGNOSIS — I82413 Acute embolism and thrombosis of femoral vein, bilateral: Secondary | ICD-10-CM | POA: Diagnosis not present

## 2019-09-06 DIAGNOSIS — Z794 Long term (current) use of insulin: Secondary | ICD-10-CM | POA: Diagnosis not present

## 2019-09-06 DIAGNOSIS — I272 Pulmonary hypertension, unspecified: Secondary | ICD-10-CM | POA: Diagnosis not present

## 2019-09-06 DIAGNOSIS — I10 Essential (primary) hypertension: Secondary | ICD-10-CM | POA: Diagnosis not present

## 2019-09-06 DIAGNOSIS — R0902 Hypoxemia: Secondary | ICD-10-CM | POA: Diagnosis not present

## 2019-09-06 DIAGNOSIS — M48061 Spinal stenosis, lumbar region without neurogenic claudication: Secondary | ICD-10-CM | POA: Diagnosis not present

## 2019-09-06 DIAGNOSIS — Z4789 Encounter for other orthopedic aftercare: Secondary | ICD-10-CM | POA: Diagnosis not present

## 2019-09-06 DIAGNOSIS — E1142 Type 2 diabetes mellitus with diabetic polyneuropathy: Secondary | ICD-10-CM | POA: Diagnosis not present

## 2019-09-06 DIAGNOSIS — J9600 Acute respiratory failure, unspecified whether with hypoxia or hypercapnia: Secondary | ICD-10-CM | POA: Diagnosis not present

## 2019-09-06 DIAGNOSIS — N184 Chronic kidney disease, stage 4 (severe): Secondary | ICD-10-CM | POA: Diagnosis not present

## 2019-09-06 DIAGNOSIS — E559 Vitamin D deficiency, unspecified: Secondary | ICD-10-CM | POA: Diagnosis not present

## 2019-09-06 DIAGNOSIS — E119 Type 2 diabetes mellitus without complications: Secondary | ICD-10-CM | POA: Diagnosis not present

## 2019-09-06 DIAGNOSIS — M109 Gout, unspecified: Secondary | ICD-10-CM | POA: Diagnosis not present

## 2019-09-06 DIAGNOSIS — Z6841 Body Mass Index (BMI) 40.0 and over, adult: Secondary | ICD-10-CM | POA: Diagnosis not present

## 2019-09-06 DIAGNOSIS — G4733 Obstructive sleep apnea (adult) (pediatric): Secondary | ICD-10-CM | POA: Diagnosis not present

## 2019-09-06 DIAGNOSIS — G4730 Sleep apnea, unspecified: Secondary | ICD-10-CM | POA: Diagnosis not present

## 2019-09-06 DIAGNOSIS — E441 Mild protein-calorie malnutrition: Secondary | ICD-10-CM | POA: Diagnosis not present

## 2019-09-06 DIAGNOSIS — Z23 Encounter for immunization: Secondary | ICD-10-CM | POA: Diagnosis not present

## 2019-09-06 DIAGNOSIS — I2609 Other pulmonary embolism with acute cor pulmonale: Secondary | ICD-10-CM | POA: Diagnosis not present

## 2019-09-06 DIAGNOSIS — J9601 Acute respiratory failure with hypoxia: Secondary | ICD-10-CM | POA: Diagnosis not present

## 2019-09-06 DIAGNOSIS — M1A09X0 Idiopathic chronic gout, multiple sites, without tophus (tophi): Secondary | ICD-10-CM | POA: Diagnosis not present

## 2019-09-06 DIAGNOSIS — M6281 Muscle weakness (generalized): Secondary | ICD-10-CM | POA: Diagnosis not present

## 2019-09-06 DIAGNOSIS — E785 Hyperlipidemia, unspecified: Secondary | ICD-10-CM | POA: Diagnosis not present

## 2019-09-06 DIAGNOSIS — Z9989 Dependence on other enabling machines and devices: Secondary | ICD-10-CM | POA: Diagnosis not present

## 2019-09-06 DIAGNOSIS — Z7401 Bed confinement status: Secondary | ICD-10-CM | POA: Diagnosis not present

## 2019-09-06 DIAGNOSIS — Z6839 Body mass index (BMI) 39.0-39.9, adult: Secondary | ICD-10-CM | POA: Diagnosis not present

## 2019-09-06 DIAGNOSIS — Z86718 Personal history of other venous thrombosis and embolism: Secondary | ICD-10-CM | POA: Diagnosis not present

## 2019-09-06 DIAGNOSIS — E1165 Type 2 diabetes mellitus with hyperglycemia: Secondary | ICD-10-CM | POA: Diagnosis not present

## 2019-09-06 DIAGNOSIS — R1310 Dysphagia, unspecified: Secondary | ICD-10-CM | POA: Diagnosis not present

## 2019-09-06 DIAGNOSIS — I2729 Other secondary pulmonary hypertension: Secondary | ICD-10-CM | POA: Diagnosis not present

## 2019-09-06 DIAGNOSIS — K219 Gastro-esophageal reflux disease without esophagitis: Secondary | ICD-10-CM | POA: Diagnosis not present

## 2019-09-06 DIAGNOSIS — S3210XS Unspecified fracture of sacrum, sequela: Secondary | ICD-10-CM | POA: Diagnosis not present

## 2019-09-07 DIAGNOSIS — M48061 Spinal stenosis, lumbar region without neurogenic claudication: Secondary | ICD-10-CM

## 2019-09-07 DIAGNOSIS — M5136 Other intervertebral disc degeneration, lumbar region: Secondary | ICD-10-CM

## 2019-09-08 DIAGNOSIS — E1142 Type 2 diabetes mellitus with diabetic polyneuropathy: Secondary | ICD-10-CM | POA: Diagnosis not present

## 2019-09-08 DIAGNOSIS — D7582 Heparin induced thrombocytopenia (HIT): Secondary | ICD-10-CM | POA: Diagnosis not present

## 2019-09-08 DIAGNOSIS — I2729 Other secondary pulmonary hypertension: Secondary | ICD-10-CM | POA: Diagnosis not present

## 2019-09-08 DIAGNOSIS — S3210XS Unspecified fracture of sacrum, sequela: Secondary | ICD-10-CM | POA: Diagnosis not present

## 2019-09-08 DIAGNOSIS — Z794 Long term (current) use of insulin: Secondary | ICD-10-CM | POA: Diagnosis not present

## 2019-09-08 DIAGNOSIS — I2699 Other pulmonary embolism without acute cor pulmonale: Secondary | ICD-10-CM | POA: Diagnosis not present

## 2019-09-13 DIAGNOSIS — E1165 Type 2 diabetes mellitus with hyperglycemia: Secondary | ICD-10-CM | POA: Diagnosis not present

## 2019-09-13 DIAGNOSIS — M109 Gout, unspecified: Secondary | ICD-10-CM | POA: Diagnosis not present

## 2019-09-21 ENCOUNTER — Other Ambulatory Visit: Payer: Self-pay

## 2019-09-21 ENCOUNTER — Ambulatory Visit: Payer: Self-pay

## 2019-09-21 ENCOUNTER — Telehealth: Payer: Medicare Other

## 2019-09-21 DIAGNOSIS — E1122 Type 2 diabetes mellitus with diabetic chronic kidney disease: Secondary | ICD-10-CM

## 2019-09-21 DIAGNOSIS — Z794 Long term (current) use of insulin: Secondary | ICD-10-CM

## 2019-09-21 DIAGNOSIS — N183 Chronic kidney disease, stage 3 unspecified: Secondary | ICD-10-CM

## 2019-09-21 DIAGNOSIS — G4733 Obstructive sleep apnea (adult) (pediatric): Secondary | ICD-10-CM

## 2019-09-21 DIAGNOSIS — Z6841 Body Mass Index (BMI) 40.0 and over, adult: Secondary | ICD-10-CM

## 2019-09-21 DIAGNOSIS — I272 Pulmonary hypertension, unspecified: Secondary | ICD-10-CM

## 2019-09-21 DIAGNOSIS — I10 Essential (primary) hypertension: Secondary | ICD-10-CM

## 2019-09-21 NOTE — Chronic Care Management (AMB) (Signed)
Chronic Care Management   Follow Up Note   09/21/2019 Name: Anna Clay MRN: [id number] DOB: Aug 09, 1950  Referred by: Glendale Chard, MD Reason for referral : Chronic Care Management (Chart Review)   Anna Clay is a 69 y.o. year old female who is a primary care patient of Glendale Chard, MD. The CCM team was consulted for assistance with chronic disease management and care coordination needs.    Review of patient status, including review of consultants reports, relevant laboratory and other test results, and collaboration with appropriate care team members and the patient's provider was performed as part of comprehensive patient evaluation and provision of chronic care management services.    SDOH (Social Determinants of Health) assessments performed: No See Care Plan activities for detailed interventions related to [facility])   Per chart review in preparation to contact patient, noted the following IP admission on 08/15/19 to [facility], dx: HCAP (healthcare-associated pneumonia); Acute respiratory failure with hypoxia; patient transferred to [place] and remains to be inpatient.     Outpatient Encounter Medications as of 09/21/2019  Medication Sig  . Accu-Chek FastClix Lancets MISC USE AS DIRECTED TO CHECK BLOOD SUGARS 2 TIMES PER DAY DX:E11.65  . acetaminophen (TYLENOL) 650 MG CR tablet Take 1,300 mg by mouth every 8 (eight) hours as needed for pain.  Marland Kitchen allopurinol (ZYLOPRIM) 100 MG tablet Take 1 tablet (100 mg total) by mouth daily.  Marland Kitchen amLODipine (NORVASC) 5 MG tablet TAKE ONE TABLET BY MOUTH DAILY  . aspirin EC 81 MG tablet Take 81 mg by mouth daily.  . calcium carbonate (TUMS - DOSED IN MG ELEMENTAL CALCIUM) 500 MG chewable tablet Chew 2 tablets by mouth daily. 2 per day   . Cholecalciferol (VITAMIN D) 2000 UNITS CAPS Take 1 capsule by mouth daily.  . colchicine 0.6 MG tablet Take 1 tablet (0.6 mg total) by mouth daily. (Patient taking  differently: Take 0.6 mg by mouth daily. PRN for gout flare)  . EPINEPHrine (EPIPEN 2-PAK) 0.3 mg/0.3 mL IJ SOAJ injection Inject 0.3 mLs (0.3 mg total) into the muscle as needed for anaphylaxis.  . ferrous sulfate 325 (65 FE) MG EC tablet Take 325 mg by mouth daily.  . fexofenadine (ALLEGRA) 180 MG tablet Take 180 mg by mouth daily.  . folic acid (FOLVITE) 1 MG tablet TAKE ONE TABLET BY MOUTH DAILY  . gabapentin (NEURONTIN) 300 MG capsule  300 mg, 1 cap, Cap, Oral, TID, 90 cap, 0 Refill(s), Route to Pharmacy Electronically, Kristopher Oppenheim at [facility], 160, 08/07/19 5:39:00 EDT, Height/Length Dosing, cm, 105.5, 08/07/19 5:39:00 EDT, Weight Dosing, kg  . glucose blood (CONTOUR NEXT TEST) test strip Use as instructed to check blood sugars 2 times per day dx: e11.22  . insulin glargine, 1 Unit Dial, (TOUJEO SOLOSTAR) 300 UNIT/ML Solostar Pen Inject 58 Units into the skin at bedtime.  . insulin lispro (HUMALOG KWIKPEN) 200 UNIT/ML KwikPen 6 units with breakfast, 6 units with lunch, and 8 units with dinner (Patient taking differently: 10 units with breakfast, 10 units with lunch, and 14 units with dinner)  . Insulin Pen Needle (NOVOFINE) 32G X 6 MM MISC USE 5 TO 8 NEEDLES PER DAY AS DIRECTED PER INSULIN PROTOCOL Dx code:e11.65  . magnesium oxide (MAG-OX) 400 MG tablet Take 400 mg by mouth daily.  . metoprolol succinate (TOPROL-XL) 25 MG 24 hr tablet Take 1 tablet (25 mg total) by mouth daily.  . Multiple Vitamin (MULTIVITAMIN WITH MINERALS)  TABS tablet Take 1 tablet by mouth daily.  . nitroGLYCERIN (NITROSTAT) 0.4 MG SL tablet Place 1 tablet (0.4 mg total) under the tongue every 5 (five) minutes as needed for chest pain.  Marland Kitchen oxyCODONE (OXY IR/ROXICODONE) 5 MG immediate release tablet Take by mouth. (Patient not taking: Reported on 08/08/2019)  . pantoprazole (PROTONIX) 40 MG tablet Take 1 tablet (40 mg total) by mouth daily.  . Pitavastatin Calcium 4 MG TABS Take 1 tablet (4 mg total) by mouth  daily.  . Potassium Gluconate 550 MG TABS Take 1 tablet by mouth daily.  . ranitidine (ZANTAC) 150 MG tablet Take 150 mg by mouth daily as needed for heartburn.  . spironolactone (ALDACTONE) 25 MG tablet Take 1 tablet (25 mg total) by mouth daily.  Marland Kitchen torsemide (DEMADEX) 20 MG tablet Take 1 tablet (20 mg total) by mouth daily.  . vitamin E (VITAMIN E) 180 MG (400 UNITS) capsule Take 400 Units by mouth daily.   No facility-administered encounter medications on file as of 09/21/2019.     Objective:  Lab Results  Component Value Date   HGBA1C 6.9 (H) 04/06/2019   HGBA1C 7.0 (H) 12/29/2018   HGBA1C 8.0 (H) 08/18/2018   Lab Results  Component Value Date   MICROALBUR 150 04/06/2019   LDLCALC 110 (H) 04/06/2019   CREATININE 1.37 (H) 04/06/2019   BP Readings from Last 3 Encounters:  06/21/19 (!) 146/88  04/06/19 138/78  04/06/19 138/78    Goals Addressed      Patient Stated   .  "I would appreciate learning more about how to meal plan" (pt-stated)        Current Barriers:  Marland Kitchen Knowledge Deficits related to Diabetes disease process and Self Health management  Nurse Case Manager Clinical Goal(s):  Marland Kitchen Over the next 90 days, patient will work with CCM RN CM  to address needs related to meal planning and basic disease process for DMII.   goal date re-established due to [place] treatment delays  CCM RN CM Interventions:  Completed on 06/16/18: completed call with patient . Evaluation of current treatment plan related to Diabetes disease management  and patient's adherence to plan as established by provider. . Provided education to patient re: basic disease process related diabetes; discussed Meal planning using the plate method; discussed current A1C and discussed target A1C . Reviewed medications with patient and discussed importance of taking prescribed medications exactly as prescribed for best effectiveness . Discussed plans with patient for ongoing care management follow up and provided  patient with direct contact information for care management team . Provided patient with printed  educational materials related to Meal Planning, Know Your A1C, Signs and Symptoms of Hypo/Hypoglycemia . Advised patient, providing education and rationale, to check cbg daily before meals  and record, calling RN CM and or Dr. Baird Cancer for findings outside established parameters.    09/21/19 Unable to assess goal outcome, patient is IP  Patient Self Care Activities:  . Self administers medications as prescribed . Attends all scheduled provider appointments . Calls pharmacy for medication refills . Attends church or other social activities . Performs ADL's independently . Performs IADL's independently . Calls provider office for new concerns or questions  Initial goal documentation      .  "to continue to get stronger following my back surgery" (pt-stated)        [place] (see longitudinal plan of care for additional care plan information)  Current Barriers:  Marland Kitchen Knowledge Deficits related to  treatment management of Spondylosis of lumbar, Lumbar facet arthropathy and DDD lumbar . Chronic Disease Management support and education needs related to DM II, Pulmonary Hypertension, OSA  Nurse Case Manager Clinical Goal(s):  Marland Kitchen Over the next 90 days, patient will work with CCM team and PCP to address needs related to disease education and support to improve Self Health management of chronic Spondylosis of lumbar, Lumbar facet arthropathy and DDD of her lumbar   CCM RN CM Interventions:  06/30/19 call completed with patient . Inter-disciplinary care team collaboration (see longitudinal plan of care) . Evaluation of current treatment plan related to Spondylosis of lumbar and DDD, arthropathy of lumbar and patient's adherence to plan as established by provider . Determined patient was admitted to [facility] on 06/01/19 after falling in her home and experiencing excruciated pain to her lower  back and lower extremities along with other neurological symptoms such as numbness and tingling down her buttocks into her lower extremities . Discussed and reviewed the following diagnosis/treatment;  o 1. Spondylosis of lumbar region without myelopathy or radiculopathy (Primary) 2. DDD (degenerative disc disease), lumbar 3. Lumbar facet arthropathy o While in the hospital patient received an L2-L3 and L5-S1 TFESI by Dr. Vinie Sill where she endorsed having 100% relief of radicular leg pain and lower back pain. o Patient had L2-L3 and L5-S1 translaminar epidural steroid injection by Dr. Vinie Sill on 06/03/19 at [facility] and her bilateral buttock pain resolved completely. Constant numbness in bilateral legs and feet. Pain Left more so than right anterior thigh.  o In home PT to resume . Determined patient completed a post operative Neurosurgical f/u a the [facility] [place] with Dr. Brayton El NP with the following visit note reviewed;  o Risks, benefits, and alternatives of the medications and treatment plan prescribed today were discussed, and patient expressed understanding. o Plan follow-up as discussed or as needed if any worsening symptoms or change in condition.  o A yearly preventative health exam was recommended and current age based recommendations were discussed . Discussed plans with patient for ongoing care management follow up and provided patient with direct contact information for care management team 09/21/19 Unable to assess goal outcome, patient is IP  Patient Self Care Activities:  . Self administers medications as prescribed . Attends all scheduled provider appointments . Calls pharmacy for medication refills . Calls provider office for new concerns or questions  Initial goal documentation     .  "to keep my Diabetes under good control" (pt-stated)        Current Barriers:  . Chronic Disease Management support and education needs related  to Diabetes  Nurse Case Manager Clinical Goal(s):  Marland Kitchen New 06/30/19 Over the next 90 days, patient will continue to engage with the CCM team and PCP for ongoing support and disease education to help improve her Self Health management of her DM  CCM RN CM Interventions:  06/30/19 call completed with patient . Evaluation of current treatment plan related to Diabetes and patient's adherence to plan as established by provider . Determined patient's A1c remains to be stable at 6.9 %; Educated on daily glycemic control FBS 80-130, <180 after meals, Educated on 15'15' rule if needed for hypoglycemic events . Reviewed meal planning and discussed typical meals for patient; education provided of importance of implementing daily exercise routine, 150 minutes per week, minimal is recommended by ADA - pt is following a daily HEP currently . Reviewed medications with patient  and discussed patient is taking and adhering to the following DM medication regimen o  Toujeo 58 units at bedtime as directed  o  Humalog insulin 6 units at breakfast, 6 units at lunch and 8 units at bedtime . Reviewed and discussed upcoming MD/Pharm D f/u visits scheduled  . Discussed plans with patient for ongoing care management follow up and provided patient with direct contact information for care management team 09/21/19 Unable to assess goal outcome, patient is IP   Patient Self Care Activities:  . Self administers medications as prescribed . Attends all scheduled provider appointments . Calls pharmacy for medication refills . Performs ADL's independently . Performs IADL's independently . Calls provider office for new concerns or questions  Please see past updates related to this goal by clicking on the "Past Updates" button in the selected goal        Plan:   The care management team will reach out to the patient upon her discharge home from acute SNF    [name], RN, BSN, CCM Care Management Coordinator [place]  Management/[facility]  Direct Phone: [phone]

## 2019-09-22 DIAGNOSIS — M109 Gout, unspecified: Secondary | ICD-10-CM | POA: Diagnosis not present

## 2019-09-27 ENCOUNTER — Telehealth: Payer: Self-pay

## 2019-09-27 NOTE — Chronic Care Management (AMB) (Signed)
Chronic Care Management Pharmacy Assistant   Name: Anna Clay  MRN: [id number] DOB: 1950-11-23  Reason for Encounter: Medication Review/Care Coordination with Outside Provider  PCP : Glendale Chard, MD  Allergies:   Allergies  Allergen Reactions  . Allspice [Pimenta] Hives and Shortness Of Breath  . Black Cohosh Hives and Shortness Of Breath  . Peach [Prunus Persica] Hives and Shortness Of Breath    Fresh peaches  . Ivp Dye [Iodinated Diagnostic Agents] Other (See Comments)    Reaction unknown  . Tetracyclines & Related Other (See Comments)    Reaction unknown    Medications: Outpatient Encounter Medications as of 09/27/2019  Medication Sig  . Accu-Chek FastClix Lancets MISC USE AS DIRECTED TO CHECK BLOOD SUGARS 2 TIMES PER DAY DX:E11.65  . acetaminophen (TYLENOL) 650 MG CR tablet Take 1,300 mg by mouth every 8 (eight) hours as needed for pain.  Marland Kitchen allopurinol (ZYLOPRIM) 100 MG tablet Take 1 tablet (100 mg total) by mouth daily.  Marland Kitchen amLODipine (NORVASC) 5 MG tablet TAKE ONE TABLET BY MOUTH DAILY  . aspirin EC 81 MG tablet Take 81 mg by mouth daily.  . calcium carbonate (TUMS - DOSED IN MG ELEMENTAL CALCIUM) 500 MG chewable tablet Chew 2 tablets by mouth daily. 2 per day   . Cholecalciferol (VITAMIN D) 2000 UNITS CAPS Take 1 capsule by mouth daily.  . colchicine 0.6 MG tablet Take 1 tablet (0.6 mg total) by mouth daily. (Patient taking differently: Take 0.6 mg by mouth daily. PRN for gout flare)  . EPINEPHrine (EPIPEN 2-PAK) 0.3 mg/0.3 mL IJ SOAJ injection Inject 0.3 mLs (0.3 mg total) into the muscle as needed for anaphylaxis.  . ferrous sulfate 325 (65 FE) MG EC tablet Take 325 mg by mouth daily.  . fexofenadine (ALLEGRA) 180 MG tablet Take 180 mg by mouth daily.  . folic acid (FOLVITE) 1 MG tablet TAKE ONE TABLET BY MOUTH DAILY  . gabapentin (NEURONTIN) 300 MG capsule  300 mg, 1 cap, Cap, Oral, TID, 90 cap, 0 Refill(s), Route to Pharmacy Electronically, Kristopher Oppenheim at [facility], 160, 08/07/19 5:39:00 EDT, Height/Length Dosing, cm, 105.5, 08/07/19 5:39:00 EDT, Weight Dosing, kg  . glucose blood (CONTOUR NEXT TEST) test strip Use as instructed to check blood sugars 2 times per day dx: e11.22  . insulin glargine, 1 Unit Dial, (TOUJEO SOLOSTAR) 300 UNIT/ML Solostar Pen Inject 58 Units into the skin at bedtime.  . insulin lispro (HUMALOG KWIKPEN) 200 UNIT/ML KwikPen 6 units with breakfast, 6 units with lunch, and 8 units with dinner (Patient taking differently: 10 units with breakfast, 10 units with lunch, and 14 units with dinner)  . Insulin Pen Needle (NOVOFINE) 32G X 6 MM MISC USE 5 TO 8 NEEDLES PER DAY AS DIRECTED PER INSULIN PROTOCOL Dx code:e11.65  . magnesium oxide (MAG-OX) 400 MG tablet Take 400 mg by mouth daily.  . metoprolol succinate (TOPROL-XL) 25 MG 24 hr tablet Take 1 tablet (25 mg total) by mouth daily.  . Multiple Vitamin (MULTIVITAMIN WITH MINERALS) TABS tablet Take 1 tablet by mouth daily.  . nitroGLYCERIN (NITROSTAT) 0.4 MG SL tablet Place 1 tablet (0.4 mg total) under the tongue every 5 (five) minutes as needed for chest pain.  Marland Kitchen oxyCODONE (OXY IR/ROXICODONE) 5 MG immediate release tablet Take by mouth. (Patient not taking: Reported on 08/08/2019)  . pantoprazole (PROTONIX) 40 MG tablet Take 1 tablet (40 mg total) by mouth daily.  . Pitavastatin Calcium 4 MG TABS Take 1 tablet (  4 mg total) by mouth daily.  . Potassium Gluconate 550 MG TABS Take 1 tablet by mouth daily.  . ranitidine (ZANTAC) 150 MG tablet Take 150 mg by mouth daily as needed for heartburn.  . spironolactone (ALDACTONE) 25 MG tablet Take 1 tablet (25 mg total) by mouth daily.  Marland Kitchen torsemide (DEMADEX) 20 MG tablet Take 1 tablet (20 mg total) by mouth daily.  . vitamin E (VITAMIN E) 180 MG (400 UNITS) capsule Take 400 Units by mouth daily.   No facility-administered encounter medications on file as of 09/27/2019.    Current Diagnosis: Patient Active Problem List   Diagnosis Date  Noted  . Sickle cell trait ([place]) 08/18/2018  . Iron deficiency anemia 10/24/2016  . Allergic rhinitis 06/07/2014  . OSA (obstructive sleep apnea) 10/05/2013  . Diabetes mellitus ([place]) 10/05/2013  . Anemia 10/05/2013  . Pulmonary hypertension ([place]) 10/04/2013  . Acute right-sided CHF (congestive heart failure) ([place]) 10/04/2013     Follow-Up:  Coordination of Enhanced Pharmacy Services and Pharmacist Review Patient due for medication dispense call on 09/26/19 for delivery date of 10/05/19 but as of 09/23/19 patient is in a [place]. Called facility to inquire if [facility] will still be used for patient's medication delivery. Lm on Scientist, forensic to return call.  10/03/19- [place] to inquire on medication process, no answer at nurses station, Left message to return call.  10/07/19- [place], transferred to nurses unit, no answer, left message once again to return call to inquire about medication refills from [facility].  10/19/19- [place] to inquire on patient status and medication adherence, spoke with Jenny Reichmann, patient is still a residence and will be followed by the SNF provider for medications and medical treatment.  [name], CPP notified.  Pattricia Boss, [place] Pharmacist Assistant [phone]

## 2019-09-30 DIAGNOSIS — Z6841 Body Mass Index (BMI) 40.0 and over, adult: Secondary | ICD-10-CM | POA: Diagnosis not present

## 2019-09-30 DIAGNOSIS — E782 Mixed hyperlipidemia: Secondary | ICD-10-CM | POA: Diagnosis not present

## 2019-09-30 DIAGNOSIS — N1831 Chronic kidney disease, stage 3a: Secondary | ICD-10-CM | POA: Diagnosis not present

## 2019-09-30 DIAGNOSIS — E1165 Type 2 diabetes mellitus with hyperglycemia: Secondary | ICD-10-CM | POA: Diagnosis not present

## 2019-09-30 DIAGNOSIS — I5032 Chronic diastolic (congestive) heart failure: Secondary | ICD-10-CM | POA: Diagnosis not present

## 2019-09-30 DIAGNOSIS — I10 Essential (primary) hypertension: Secondary | ICD-10-CM | POA: Diagnosis not present

## 2019-09-30 DIAGNOSIS — M109 Gout, unspecified: Secondary | ICD-10-CM | POA: Diagnosis not present

## 2019-10-04 DIAGNOSIS — M109 Gout, unspecified: Secondary | ICD-10-CM | POA: Diagnosis not present

## 2019-10-10 DIAGNOSIS — R0989 Other specified symptoms and signs involving the circulatory and respiratory systems: Secondary | ICD-10-CM | POA: Diagnosis not present

## 2019-10-10 DIAGNOSIS — R05 Cough: Secondary | ICD-10-CM | POA: Diagnosis not present

## 2019-10-10 DIAGNOSIS — Z9989 Dependence on other enabling machines and devices: Secondary | ICD-10-CM | POA: Diagnosis not present

## 2019-10-10 DIAGNOSIS — I2729 Other secondary pulmonary hypertension: Secondary | ICD-10-CM | POA: Diagnosis not present

## 2019-10-10 DIAGNOSIS — G4733 Obstructive sleep apnea (adult) (pediatric): Secondary | ICD-10-CM | POA: Diagnosis not present

## 2019-10-10 DIAGNOSIS — I5032 Chronic diastolic (congestive) heart failure: Secondary | ICD-10-CM | POA: Diagnosis not present

## 2019-10-10 DIAGNOSIS — Z4789 Encounter for other orthopedic aftercare: Secondary | ICD-10-CM | POA: Diagnosis not present

## 2019-10-10 DIAGNOSIS — R062 Wheezing: Secondary | ICD-10-CM | POA: Diagnosis not present

## 2019-10-10 DIAGNOSIS — Z6839 Body mass index (BMI) 39.0-39.9, adult: Secondary | ICD-10-CM | POA: Diagnosis not present

## 2019-10-10 DIAGNOSIS — M4726 Other spondylosis with radiculopathy, lumbar region: Secondary | ICD-10-CM | POA: Diagnosis not present

## 2019-10-10 DIAGNOSIS — I2699 Other pulmonary embolism without acute cor pulmonale: Secondary | ICD-10-CM | POA: Diagnosis not present

## 2019-10-10 DIAGNOSIS — M5136 Other intervertebral disc degeneration, lumbar region: Secondary | ICD-10-CM | POA: Diagnosis not present

## 2019-10-10 DIAGNOSIS — I5020 Unspecified systolic (congestive) heart failure: Secondary | ICD-10-CM | POA: Diagnosis not present

## 2019-10-11 DIAGNOSIS — J069 Acute upper respiratory infection, unspecified: Secondary | ICD-10-CM | POA: Diagnosis not present

## 2019-10-11 DIAGNOSIS — M109 Gout, unspecified: Secondary | ICD-10-CM | POA: Diagnosis not present

## 2019-10-19 ENCOUNTER — Ambulatory Visit: Payer: Self-pay

## 2019-10-19 ENCOUNTER — Other Ambulatory Visit: Payer: Self-pay

## 2019-10-19 ENCOUNTER — Telehealth: Payer: Medicare Other

## 2019-10-19 DIAGNOSIS — I10 Essential (primary) hypertension: Secondary | ICD-10-CM

## 2019-10-19 DIAGNOSIS — N183 Chronic kidney disease, stage 3 unspecified: Secondary | ICD-10-CM

## 2019-10-19 DIAGNOSIS — I272 Pulmonary hypertension, unspecified: Secondary | ICD-10-CM

## 2019-10-19 DIAGNOSIS — Z794 Long term (current) use of insulin: Secondary | ICD-10-CM

## 2019-10-19 DIAGNOSIS — Z6841 Body Mass Index (BMI) 40.0 and over, adult: Secondary | ICD-10-CM

## 2019-10-19 DIAGNOSIS — G4733 Obstructive sleep apnea (adult) (pediatric): Secondary | ICD-10-CM

## 2019-10-19 NOTE — Chronic Care Management (AMB) (Signed)
Chronic Care Management   Follow Up Note   10/19/2019 Name: Anna Clay MRN: [id number] DOB: 13-Dec-1950  Referred by: Glendale Chard, MD Reason for referral : Chronic Care Management (CCM RN CM Chart Review)   Anna Clay is a 69 y.o. year old female who is a primary care patient of Glendale Chard, MD. The CCM team was consulted for assistance with chronic disease management and care coordination needs.    Review of patient status, including review of consultants reports, relevant laboratory and other test results, and collaboration with appropriate care team members and the patient's provider was performed as part of comprehensive patient evaluation and provision of chronic care management services.    Per chart review in preparation to contact patient, noted patient remains to be admitted to [facility]. A CCM team member will outreach to patient upon her discharge home to resume embedded CCM services.     Outpatient Encounter Medications as of 10/19/2019  Medication Sig  . Accu-Chek FastClix Lancets MISC USE AS DIRECTED TO CHECK BLOOD SUGARS 2 TIMES PER DAY DX:E11.65  . acetaminophen (TYLENOL) 650 MG CR tablet Take 1,300 mg by mouth every 8 (eight) hours as needed for pain.  Marland Kitchen allopurinol (ZYLOPRIM) 100 MG tablet Take 1 tablet (100 mg total) by mouth daily.  Marland Kitchen amLODipine (NORVASC) 5 MG tablet TAKE ONE TABLET BY MOUTH DAILY  . aspirin EC 81 MG tablet Take 81 mg by mouth daily.  . calcium carbonate (TUMS - DOSED IN MG ELEMENTAL CALCIUM) 500 MG chewable tablet Chew 2 tablets by mouth daily. 2 per day   . Cholecalciferol (VITAMIN D) 2000 UNITS CAPS Take 1 capsule by mouth daily.  . colchicine 0.6 MG tablet Take 1 tablet (0.6 mg total) by mouth daily. (Patient taking differently: Take 0.6 mg by mouth daily. PRN for gout flare)  . EPINEPHrine (EPIPEN 2-PAK) 0.3 mg/0.3 mL IJ SOAJ injection Inject 0.3 mLs (0.3 mg total) into the muscle as needed for anaphylaxis.  . ferrous  sulfate 325 (65 FE) MG EC tablet Take 325 mg by mouth daily.  . fexofenadine (ALLEGRA) 180 MG tablet Take 180 mg by mouth daily.  . folic acid (FOLVITE) 1 MG tablet TAKE ONE TABLET BY MOUTH DAILY  . gabapentin (NEURONTIN) 300 MG capsule  300 mg, 1 cap, Cap, Oral, TID, 90 cap, 0 Refill(s), Route to Pharmacy Electronically, Kristopher Oppenheim at [facility], 160, 08/07/19 5:39:00 EDT, Height/Length Dosing, cm, 105.5, 08/07/19 5:39:00 EDT, Weight Dosing, kg  . glucose blood (CONTOUR NEXT TEST) test strip Use as instructed to check blood sugars 2 times per day dx: e11.22  . insulin glargine, 1 Unit Dial, (TOUJEO SOLOSTAR) 300 UNIT/ML Solostar Pen Inject 58 Units into the skin at bedtime.  . insulin lispro (HUMALOG KWIKPEN) 200 UNIT/ML KwikPen 6 units with breakfast, 6 units with lunch, and 8 units with dinner (Patient taking differently: 10 units with breakfast, 10 units with lunch, and 14 units with dinner)  . Insulin Pen Needle (NOVOFINE) 32G X 6 MM MISC USE 5 TO 8 NEEDLES PER DAY AS DIRECTED PER INSULIN PROTOCOL Dx code:e11.65  . magnesium oxide (MAG-OX) 400 MG tablet Take 400 mg by mouth daily.  . metoprolol succinate (TOPROL-XL) 25 MG 24 hr tablet Take 1 tablet (25 mg total) by mouth daily.  . Multiple Vitamin (MULTIVITAMIN WITH MINERALS) TABS tablet Take 1 tablet by mouth daily.  . nitroGLYCERIN (NITROSTAT) 0.4 MG SL tablet Place 1 tablet (0.4 mg total) under the  tongue every 5 (five) minutes as needed for chest pain.  Marland Kitchen oxyCODONE (OXY IR/ROXICODONE) 5 MG immediate release tablet Take by mouth. (Patient not taking: Reported on 08/08/2019)  . pantoprazole (PROTONIX) 40 MG tablet Take 1 tablet (40 mg total) by mouth daily.  . Pitavastatin Calcium 4 MG TABS Take 1 tablet (4 mg total) by mouth daily.  . Potassium Gluconate 550 MG TABS Take 1 tablet by mouth daily.  . ranitidine (ZANTAC) 150 MG tablet Take 150 mg by mouth daily as needed for heartburn.  . spironolactone (ALDACTONE) 25 MG tablet Take 1  tablet (25 mg total) by mouth daily.  Marland Kitchen torsemide (DEMADEX) 20 MG tablet Take 1 tablet (20 mg total) by mouth daily.  . vitamin E (VITAMIN E) 180 MG (400 UNITS) capsule Take 400 Units by mouth daily.   No facility-administered encounter medications on file as of 10/19/2019.     Objective:  Lab Results  Component Value Date   HGBA1C 6.9 (H) 04/06/2019   HGBA1C 7.0 (H) 12/29/2018   HGBA1C 8.0 (H) 08/18/2018   Lab Results  Component Value Date   MICROALBUR 150 04/06/2019   LDLCALC 110 (H) 04/06/2019   CREATININE 1.37 (H) 04/06/2019   BP Readings from Last 3 Encounters:  06/21/19 (!) 146/88  04/06/19 138/78  04/06/19 138/78    Goals Addressed      Patient Stated   .  "I would appreciate learning more about how to meal plan" (pt-stated)        Current Barriers:  Marland Kitchen Knowledge Deficits related to Diabetes disease process and Self Health management  Nurse Case Manager Clinical Goal(s):  Marland Kitchen Over the next 90 days, patient will work with CCM RN CM  to address needs related to meal planning and basic disease process for DMII.   Goal Outcome Unknown   CCM RN CM Interventions:  10/19/19 CCM Chart Review . Evaluation of current treatment plan related to Diabetes disease management  and patient's adherence to plan as established by provider. . Per review of patient record, patient remains to be admitted to [facility]  Patient Self Care Activities:  . Self administers medications as prescribed . Attends all scheduled provider appointments . Calls pharmacy for medication refills . Attends church or other social activities . Performs ADL's independently . Performs IADL's independently . Calls provider office for new concerns or questions  Please see past updates related to this goal by clicking on the "Past Updates" button in the selected goal     .  "to continue to get stronger following my back surgery" (pt-stated)        [place] (see longitudinal plan of care  for additional care plan information)  Current Barriers:  Marland Kitchen Knowledge Deficits related to treatment management of Spondylosis of lumbar, Lumbar facet arthropathy and DDD lumbar . Chronic Disease Management support and education needs related to DM II, Pulmonary Hypertension, OSA  Nurse Case Manager Clinical Goal(s):  Marland Kitchen Over the next 90 days, patient will work with CCM team and PCP to address needs related to disease education and support to improve Self Health management of chronic Spondylosis of lumbar, Lumbar facet arthropathy and DDD of her lumbar   Goal Outcome Unknown  CCM RN CM Interventions:  10/19/19 CCM Chart Review  . Inter-disciplinary care team collaboration (see longitudinal plan of care) . Evaluation of current treatment plan related to Spondylosis of lumbar and DDD, arthropathy of lumbar and patient's adherence to plan as established  by provider . Per review of patient record, patient remains to be admitted to [facility] [place] [facility]  Patient Self Care Activities:  . Self administers medications as prescribed . Attends all scheduled provider appointments . Calls pharmacy for medication refills . Calls provider office for new concerns or questions  Please see past updates related to this goal by clicking on the "Past Updates" button in the selected goal      .  "to keep my Diabetes under good control" (pt-stated)        Current Barriers:  . Chronic Disease Management support and education needs related to Diabetes  Nurse Case Manager Clinical Goal(s):  Marland Kitchen New 06/30/19 Over the next 90 days, patient will continue to engage with the CCM team and PCP for ongoing support and disease education to help improve her Self Health management of her DM - Goal Outcome Unknown  CCM RN CM Interventions:  10/19/19 CCM Chart Review . Evaluation of current treatment plan related to Diabetes and patient's adherence to plan as established by provider . Per review of patient record,  patient remains to be admitted to [facility] [place]  Patient Self Care Activities:  . Self administers medications as prescribed . Attends all scheduled provider appointments . Calls pharmacy for medication refills . Performs ADL's independently . Performs IADL's independently . Calls provider office for new concerns or questions  Please see past updates related to this goal by clicking on the "Past Updates" button in the selected goal        Plan:   Continue to monitor for patient discharge to home  [name], RN, BSN, CCM Care Management Coordinator [place] [facility]  Direct Phone: [phone]

## 2019-10-21 DIAGNOSIS — I10 Essential (primary) hypertension: Secondary | ICD-10-CM | POA: Diagnosis not present

## 2019-10-21 DIAGNOSIS — N1831 Chronic kidney disease, stage 3a: Secondary | ICD-10-CM | POA: Diagnosis not present

## 2019-10-21 DIAGNOSIS — Z6839 Body mass index (BMI) 39.0-39.9, adult: Secondary | ICD-10-CM | POA: Diagnosis not present

## 2019-10-21 DIAGNOSIS — Z794 Long term (current) use of insulin: Secondary | ICD-10-CM | POA: Diagnosis not present

## 2019-10-21 DIAGNOSIS — E1142 Type 2 diabetes mellitus with diabetic polyneuropathy: Secondary | ICD-10-CM | POA: Diagnosis not present

## 2019-10-21 DIAGNOSIS — E782 Mixed hyperlipidemia: Secondary | ICD-10-CM | POA: Diagnosis not present

## 2019-10-21 DIAGNOSIS — Z86718 Personal history of other venous thrombosis and embolism: Secondary | ICD-10-CM | POA: Diagnosis not present

## 2019-10-21 DIAGNOSIS — I5032 Chronic diastolic (congestive) heart failure: Secondary | ICD-10-CM | POA: Diagnosis not present

## 2019-10-21 DIAGNOSIS — M1A09X0 Idiopathic chronic gout, multiple sites, without tophus (tophi): Secondary | ICD-10-CM | POA: Diagnosis not present

## 2019-11-01 DIAGNOSIS — M109 Gout, unspecified: Secondary | ICD-10-CM | POA: Diagnosis not present

## 2019-11-01 DIAGNOSIS — I5032 Chronic diastolic (congestive) heart failure: Secondary | ICD-10-CM | POA: Diagnosis not present

## 2019-11-01 DIAGNOSIS — Z86718 Personal history of other venous thrombosis and embolism: Secondary | ICD-10-CM | POA: Diagnosis not present

## 2019-11-01 DIAGNOSIS — N1831 Chronic kidney disease, stage 3a: Secondary | ICD-10-CM | POA: Diagnosis not present

## 2019-11-01 DIAGNOSIS — Z794 Long term (current) use of insulin: Secondary | ICD-10-CM | POA: Diagnosis not present

## 2019-11-01 DIAGNOSIS — Z6839 Body mass index (BMI) 39.0-39.9, adult: Secondary | ICD-10-CM | POA: Diagnosis not present

## 2019-11-01 DIAGNOSIS — E1142 Type 2 diabetes mellitus with diabetic polyneuropathy: Secondary | ICD-10-CM | POA: Diagnosis not present

## 2019-11-01 DIAGNOSIS — I10 Essential (primary) hypertension: Secondary | ICD-10-CM | POA: Diagnosis not present

## 2019-11-01 DIAGNOSIS — E782 Mixed hyperlipidemia: Secondary | ICD-10-CM | POA: Diagnosis not present

## 2019-11-04 ENCOUNTER — Telehealth: Payer: Medicare Other

## 2019-11-05 DIAGNOSIS — M5441 Lumbago with sciatica, right side: Secondary | ICD-10-CM | POA: Diagnosis not present

## 2019-11-05 DIAGNOSIS — W19XXXD Unspecified fall, subsequent encounter: Secondary | ICD-10-CM | POA: Diagnosis not present

## 2019-11-05 DIAGNOSIS — Z9181 History of falling: Secondary | ICD-10-CM | POA: Diagnosis not present

## 2019-11-05 DIAGNOSIS — I10 Essential (primary) hypertension: Secondary | ICD-10-CM | POA: Diagnosis not present

## 2019-11-05 DIAGNOSIS — N1831 Chronic kidney disease, stage 3a: Secondary | ICD-10-CM | POA: Diagnosis not present

## 2019-11-05 DIAGNOSIS — R339 Retention of urine, unspecified: Secondary | ICD-10-CM | POA: Diagnosis not present

## 2019-11-05 DIAGNOSIS — M47816 Spondylosis without myelopathy or radiculopathy, lumbar region: Secondary | ICD-10-CM | POA: Diagnosis not present

## 2019-11-05 DIAGNOSIS — I130 Hypertensive heart and chronic kidney disease with heart failure and stage 1 through stage 4 chronic kidney disease, or unspecified chronic kidney disease: Secondary | ICD-10-CM | POA: Diagnosis not present

## 2019-11-05 DIAGNOSIS — I5042 Chronic combined systolic (congestive) and diastolic (congestive) heart failure: Secondary | ICD-10-CM | POA: Diagnosis not present

## 2019-11-05 DIAGNOSIS — Z794 Long term (current) use of insulin: Secondary | ICD-10-CM | POA: Diagnosis not present

## 2019-11-05 DIAGNOSIS — M5136 Other intervertebral disc degeneration, lumbar region: Secondary | ICD-10-CM | POA: Diagnosis not present

## 2019-11-05 DIAGNOSIS — Z86718 Personal history of other venous thrombosis and embolism: Secondary | ICD-10-CM | POA: Diagnosis not present

## 2019-11-05 DIAGNOSIS — E1142 Type 2 diabetes mellitus with diabetic polyneuropathy: Secondary | ICD-10-CM | POA: Diagnosis not present

## 2019-11-05 DIAGNOSIS — G4733 Obstructive sleep apnea (adult) (pediatric): Secondary | ICD-10-CM | POA: Diagnosis not present

## 2019-11-05 DIAGNOSIS — E875 Hyperkalemia: Secondary | ICD-10-CM | POA: Diagnosis not present

## 2019-11-05 DIAGNOSIS — Z7984 Long term (current) use of oral hypoglycemic drugs: Secondary | ICD-10-CM | POA: Diagnosis not present

## 2019-11-05 DIAGNOSIS — E1165 Type 2 diabetes mellitus with hyperglycemia: Secondary | ICD-10-CM | POA: Diagnosis not present

## 2019-11-05 DIAGNOSIS — Z7982 Long term (current) use of aspirin: Secondary | ICD-10-CM | POA: Diagnosis not present

## 2019-11-05 DIAGNOSIS — Z6841 Body Mass Index (BMI) 40.0 and over, adult: Secondary | ICD-10-CM | POA: Diagnosis not present

## 2019-11-05 DIAGNOSIS — E782 Mixed hyperlipidemia: Secondary | ICD-10-CM | POA: Diagnosis not present

## 2019-11-05 DIAGNOSIS — E1122 Type 2 diabetes mellitus with diabetic chronic kidney disease: Secondary | ICD-10-CM | POA: Diagnosis not present

## 2019-11-05 DIAGNOSIS — M5442 Lumbago with sciatica, left side: Secondary | ICD-10-CM | POA: Diagnosis not present

## 2019-11-05 DIAGNOSIS — I2729 Other secondary pulmonary hypertension: Secondary | ICD-10-CM | POA: Diagnosis not present

## 2019-11-05 DIAGNOSIS — M1009 Idiopathic gout, multiple sites: Secondary | ICD-10-CM | POA: Diagnosis not present

## 2019-11-05 DIAGNOSIS — D631 Anemia in chronic kidney disease: Secondary | ICD-10-CM | POA: Diagnosis not present

## 2019-11-07 DIAGNOSIS — N1831 Chronic kidney disease, stage 3a: Secondary | ICD-10-CM | POA: Diagnosis not present

## 2019-11-07 DIAGNOSIS — E1122 Type 2 diabetes mellitus with diabetic chronic kidney disease: Secondary | ICD-10-CM | POA: Diagnosis not present

## 2019-11-07 DIAGNOSIS — I5042 Chronic combined systolic (congestive) and diastolic (congestive) heart failure: Secondary | ICD-10-CM | POA: Diagnosis not present

## 2019-11-07 DIAGNOSIS — M1009 Idiopathic gout, multiple sites: Secondary | ICD-10-CM | POA: Diagnosis not present

## 2019-11-07 DIAGNOSIS — I130 Hypertensive heart and chronic kidney disease with heart failure and stage 1 through stage 4 chronic kidney disease, or unspecified chronic kidney disease: Secondary | ICD-10-CM | POA: Diagnosis not present

## 2019-11-07 DIAGNOSIS — D631 Anemia in chronic kidney disease: Secondary | ICD-10-CM | POA: Diagnosis not present

## 2019-11-08 DIAGNOSIS — N1831 Chronic kidney disease, stage 3a: Secondary | ICD-10-CM | POA: Diagnosis not present

## 2019-11-08 DIAGNOSIS — I130 Hypertensive heart and chronic kidney disease with heart failure and stage 1 through stage 4 chronic kidney disease, or unspecified chronic kidney disease: Secondary | ICD-10-CM | POA: Diagnosis not present

## 2019-11-08 DIAGNOSIS — M1009 Idiopathic gout, multiple sites: Secondary | ICD-10-CM | POA: Diagnosis not present

## 2019-11-08 DIAGNOSIS — E1122 Type 2 diabetes mellitus with diabetic chronic kidney disease: Secondary | ICD-10-CM | POA: Diagnosis not present

## 2019-11-08 DIAGNOSIS — I5042 Chronic combined systolic (congestive) and diastolic (congestive) heart failure: Secondary | ICD-10-CM | POA: Diagnosis not present

## 2019-11-08 DIAGNOSIS — D631 Anemia in chronic kidney disease: Secondary | ICD-10-CM | POA: Diagnosis not present

## 2019-11-14 DIAGNOSIS — M1009 Idiopathic gout, multiple sites: Secondary | ICD-10-CM | POA: Diagnosis not present

## 2019-11-14 DIAGNOSIS — N1831 Chronic kidney disease, stage 3a: Secondary | ICD-10-CM | POA: Diagnosis not present

## 2019-11-14 DIAGNOSIS — I5042 Chronic combined systolic (congestive) and diastolic (congestive) heart failure: Secondary | ICD-10-CM | POA: Diagnosis not present

## 2019-11-14 DIAGNOSIS — I130 Hypertensive heart and chronic kidney disease with heart failure and stage 1 through stage 4 chronic kidney disease, or unspecified chronic kidney disease: Secondary | ICD-10-CM | POA: Diagnosis not present

## 2019-11-14 DIAGNOSIS — D631 Anemia in chronic kidney disease: Secondary | ICD-10-CM | POA: Diagnosis not present

## 2019-11-14 DIAGNOSIS — E1122 Type 2 diabetes mellitus with diabetic chronic kidney disease: Secondary | ICD-10-CM | POA: Diagnosis not present

## 2019-11-15 ENCOUNTER — Telehealth: Payer: Self-pay

## 2019-11-15 DIAGNOSIS — D631 Anemia in chronic kidney disease: Secondary | ICD-10-CM | POA: Diagnosis not present

## 2019-11-15 DIAGNOSIS — N1831 Chronic kidney disease, stage 3a: Secondary | ICD-10-CM | POA: Diagnosis not present

## 2019-11-15 DIAGNOSIS — I5042 Chronic combined systolic (congestive) and diastolic (congestive) heart failure: Secondary | ICD-10-CM | POA: Diagnosis not present

## 2019-11-15 DIAGNOSIS — M1009 Idiopathic gout, multiple sites: Secondary | ICD-10-CM | POA: Diagnosis not present

## 2019-11-15 DIAGNOSIS — I130 Hypertensive heart and chronic kidney disease with heart failure and stage 1 through stage 4 chronic kidney disease, or unspecified chronic kidney disease: Secondary | ICD-10-CM | POA: Diagnosis not present

## 2019-11-15 DIAGNOSIS — E1122 Type 2 diabetes mellitus with diabetic chronic kidney disease: Secondary | ICD-10-CM | POA: Diagnosis not present

## 2019-11-15 NOTE — Telephone Encounter (Signed)
   Transition Care Management Follow-up Telephone Call  Date of discharge and from where: 11/03/2019 from [facility]  How have you been since you were released from the hospital? Patient said she is feeling better and slowing gaining strength.  Any questions or concerns? Concerned about ankles and feet swelling as the day goes but they are fine first thing in the morning.   Items Reviewed:  Did the pt receive and understand the discharge instructions provided? yes  Medications obtained and verified? Yes  Any new allergies since your discharge? No  Dietary orders reviewed? Diabetic diet  Do you have support at home? Yes  Other (ie: DME, Home Health, etc) No  Functional Questionnaire: (I = Independent and D = Dependent)  Bathing/Dressing-D   Meal Prep- D  Eating- D  Maintaining continence- I  Transferring/Ambulation- I D Managing Meds-    Follow up appointments reviewed:    PCP Hospital f/u appt confirmed? Calling the pt back with an appt date and time.  [facility] f/u appt confirmed?   Are transportation arrangements needed? N/a patient request a virtual appt  If their condition worsens, is the pt aware to call  their PCP or go to the ED? Yes  Was the patient provided with contact information for the PCP's office or ED?Yes  Was the pt encouraged to call back with questions or concerns? Yes

## 2019-11-15 NOTE — Telephone Encounter (Signed)
error 

## 2019-11-16 DIAGNOSIS — E1122 Type 2 diabetes mellitus with diabetic chronic kidney disease: Secondary | ICD-10-CM | POA: Diagnosis not present

## 2019-11-16 DIAGNOSIS — I5042 Chronic combined systolic (congestive) and diastolic (congestive) heart failure: Secondary | ICD-10-CM | POA: Diagnosis not present

## 2019-11-16 DIAGNOSIS — N1831 Chronic kidney disease, stage 3a: Secondary | ICD-10-CM | POA: Diagnosis not present

## 2019-11-16 DIAGNOSIS — M1009 Idiopathic gout, multiple sites: Secondary | ICD-10-CM | POA: Diagnosis not present

## 2019-11-16 DIAGNOSIS — D631 Anemia in chronic kidney disease: Secondary | ICD-10-CM | POA: Diagnosis not present

## 2019-11-16 DIAGNOSIS — I130 Hypertensive heart and chronic kidney disease with heart failure and stage 1 through stage 4 chronic kidney disease, or unspecified chronic kidney disease: Secondary | ICD-10-CM | POA: Diagnosis not present

## 2019-11-17 ENCOUNTER — Telehealth: Payer: Self-pay

## 2019-11-17 DIAGNOSIS — I130 Hypertensive heart and chronic kidney disease with heart failure and stage 1 through stage 4 chronic kidney disease, or unspecified chronic kidney disease: Secondary | ICD-10-CM | POA: Diagnosis not present

## 2019-11-17 DIAGNOSIS — M1009 Idiopathic gout, multiple sites: Secondary | ICD-10-CM | POA: Diagnosis not present

## 2019-11-17 DIAGNOSIS — N1831 Chronic kidney disease, stage 3a: Secondary | ICD-10-CM | POA: Diagnosis not present

## 2019-11-17 DIAGNOSIS — E1122 Type 2 diabetes mellitus with diabetic chronic kidney disease: Secondary | ICD-10-CM | POA: Diagnosis not present

## 2019-11-17 DIAGNOSIS — I5042 Chronic combined systolic (congestive) and diastolic (congestive) heart failure: Secondary | ICD-10-CM | POA: Diagnosis not present

## 2019-11-17 DIAGNOSIS — D631 Anemia in chronic kidney disease: Secondary | ICD-10-CM | POA: Diagnosis not present

## 2019-11-17 NOTE — Telephone Encounter (Signed)
Please advise the patient to cut back on her salt intake. Is she experiencing shortness of breath? When is she due to see cardiologist again?

## 2019-11-17 NOTE — Telephone Encounter (Signed)
Shelly with [employer] calling to report a 6 pound weight gain this week for the pt. She is asking if you would like to send something in or up her dose on one of her medications (spironolactone)?

## 2019-11-18 DIAGNOSIS — I130 Hypertensive heart and chronic kidney disease with heart failure and stage 1 through stage 4 chronic kidney disease, or unspecified chronic kidney disease: Secondary | ICD-10-CM | POA: Diagnosis not present

## 2019-11-18 DIAGNOSIS — D631 Anemia in chronic kidney disease: Secondary | ICD-10-CM | POA: Diagnosis not present

## 2019-11-18 DIAGNOSIS — I5042 Chronic combined systolic (congestive) and diastolic (congestive) heart failure: Secondary | ICD-10-CM | POA: Diagnosis not present

## 2019-11-18 DIAGNOSIS — E1122 Type 2 diabetes mellitus with diabetic chronic kidney disease: Secondary | ICD-10-CM | POA: Diagnosis not present

## 2019-11-18 DIAGNOSIS — M1009 Idiopathic gout, multiple sites: Secondary | ICD-10-CM | POA: Diagnosis not present

## 2019-11-18 DIAGNOSIS — N1831 Chronic kidney disease, stage 3a: Secondary | ICD-10-CM | POA: Diagnosis not present

## 2019-11-18 NOTE — Telephone Encounter (Signed)
The pt said that the only time she has shortness of breath is if she's without her oxygen, she has no problem cutting back on the salt, she usually tries to eat low salt and fresh vegetables, she was supposed to see the cardiologist around the time she was admitted in the hospital and that she has to call them to reschedule the appt she had.

## 2019-11-21 DIAGNOSIS — I130 Hypertensive heart and chronic kidney disease with heart failure and stage 1 through stage 4 chronic kidney disease, or unspecified chronic kidney disease: Secondary | ICD-10-CM | POA: Diagnosis not present

## 2019-11-21 DIAGNOSIS — E1122 Type 2 diabetes mellitus with diabetic chronic kidney disease: Secondary | ICD-10-CM | POA: Diagnosis not present

## 2019-11-21 DIAGNOSIS — M1009 Idiopathic gout, multiple sites: Secondary | ICD-10-CM | POA: Diagnosis not present

## 2019-11-21 DIAGNOSIS — D631 Anemia in chronic kidney disease: Secondary | ICD-10-CM | POA: Diagnosis not present

## 2019-11-21 DIAGNOSIS — N1831 Chronic kidney disease, stage 3a: Secondary | ICD-10-CM | POA: Diagnosis not present

## 2019-11-21 DIAGNOSIS — I5042 Chronic combined systolic (congestive) and diastolic (congestive) heart failure: Secondary | ICD-10-CM | POA: Diagnosis not present

## 2019-11-22 DIAGNOSIS — N1831 Chronic kidney disease, stage 3a: Secondary | ICD-10-CM | POA: Diagnosis not present

## 2019-11-22 DIAGNOSIS — I5042 Chronic combined systolic (congestive) and diastolic (congestive) heart failure: Secondary | ICD-10-CM | POA: Diagnosis not present

## 2019-11-22 DIAGNOSIS — I130 Hypertensive heart and chronic kidney disease with heart failure and stage 1 through stage 4 chronic kidney disease, or unspecified chronic kidney disease: Secondary | ICD-10-CM | POA: Diagnosis not present

## 2019-11-22 DIAGNOSIS — E1122 Type 2 diabetes mellitus with diabetic chronic kidney disease: Secondary | ICD-10-CM | POA: Diagnosis not present

## 2019-11-22 DIAGNOSIS — M1009 Idiopathic gout, multiple sites: Secondary | ICD-10-CM | POA: Diagnosis not present

## 2019-11-22 DIAGNOSIS — D631 Anemia in chronic kidney disease: Secondary | ICD-10-CM | POA: Diagnosis not present

## 2019-11-23 DIAGNOSIS — I5042 Chronic combined systolic (congestive) and diastolic (congestive) heart failure: Secondary | ICD-10-CM | POA: Diagnosis not present

## 2019-11-23 DIAGNOSIS — E1122 Type 2 diabetes mellitus with diabetic chronic kidney disease: Secondary | ICD-10-CM | POA: Diagnosis not present

## 2019-11-23 DIAGNOSIS — M1009 Idiopathic gout, multiple sites: Secondary | ICD-10-CM | POA: Diagnosis not present

## 2019-11-23 DIAGNOSIS — N1831 Chronic kidney disease, stage 3a: Secondary | ICD-10-CM | POA: Diagnosis not present

## 2019-11-23 DIAGNOSIS — D631 Anemia in chronic kidney disease: Secondary | ICD-10-CM | POA: Diagnosis not present

## 2019-11-23 DIAGNOSIS — I130 Hypertensive heart and chronic kidney disease with heart failure and stage 1 through stage 4 chronic kidney disease, or unspecified chronic kidney disease: Secondary | ICD-10-CM | POA: Diagnosis not present

## 2019-11-24 DIAGNOSIS — I130 Hypertensive heart and chronic kidney disease with heart failure and stage 1 through stage 4 chronic kidney disease, or unspecified chronic kidney disease: Secondary | ICD-10-CM | POA: Diagnosis not present

## 2019-11-24 DIAGNOSIS — I5042 Chronic combined systolic (congestive) and diastolic (congestive) heart failure: Secondary | ICD-10-CM | POA: Diagnosis not present

## 2019-11-24 DIAGNOSIS — E1122 Type 2 diabetes mellitus with diabetic chronic kidney disease: Secondary | ICD-10-CM | POA: Diagnosis not present

## 2019-11-24 DIAGNOSIS — D631 Anemia in chronic kidney disease: Secondary | ICD-10-CM | POA: Diagnosis not present

## 2019-11-24 DIAGNOSIS — M1009 Idiopathic gout, multiple sites: Secondary | ICD-10-CM | POA: Diagnosis not present

## 2019-11-24 DIAGNOSIS — N1831 Chronic kidney disease, stage 3a: Secondary | ICD-10-CM | POA: Diagnosis not present

## 2019-11-28 ENCOUNTER — Other Ambulatory Visit: Payer: Self-pay | Admitting: Internal Medicine

## 2019-11-28 DIAGNOSIS — D631 Anemia in chronic kidney disease: Secondary | ICD-10-CM | POA: Diagnosis not present

## 2019-11-28 DIAGNOSIS — M1009 Idiopathic gout, multiple sites: Secondary | ICD-10-CM | POA: Diagnosis not present

## 2019-11-28 DIAGNOSIS — I5042 Chronic combined systolic (congestive) and diastolic (congestive) heart failure: Secondary | ICD-10-CM | POA: Diagnosis not present

## 2019-11-28 DIAGNOSIS — N1831 Chronic kidney disease, stage 3a: Secondary | ICD-10-CM | POA: Diagnosis not present

## 2019-11-28 DIAGNOSIS — E1122 Type 2 diabetes mellitus with diabetic chronic kidney disease: Secondary | ICD-10-CM | POA: Diagnosis not present

## 2019-11-28 DIAGNOSIS — I130 Hypertensive heart and chronic kidney disease with heart failure and stage 1 through stage 4 chronic kidney disease, or unspecified chronic kidney disease: Secondary | ICD-10-CM | POA: Diagnosis not present

## 2019-11-29 DIAGNOSIS — I130 Hypertensive heart and chronic kidney disease with heart failure and stage 1 through stage 4 chronic kidney disease, or unspecified chronic kidney disease: Secondary | ICD-10-CM | POA: Diagnosis not present

## 2019-11-29 DIAGNOSIS — D631 Anemia in chronic kidney disease: Secondary | ICD-10-CM | POA: Diagnosis not present

## 2019-11-29 DIAGNOSIS — I5042 Chronic combined systolic (congestive) and diastolic (congestive) heart failure: Secondary | ICD-10-CM | POA: Diagnosis not present

## 2019-11-29 DIAGNOSIS — M1009 Idiopathic gout, multiple sites: Secondary | ICD-10-CM | POA: Diagnosis not present

## 2019-11-29 DIAGNOSIS — N1831 Chronic kidney disease, stage 3a: Secondary | ICD-10-CM | POA: Diagnosis not present

## 2019-11-29 DIAGNOSIS — E1122 Type 2 diabetes mellitus with diabetic chronic kidney disease: Secondary | ICD-10-CM | POA: Diagnosis not present

## 2019-11-30 DIAGNOSIS — I130 Hypertensive heart and chronic kidney disease with heart failure and stage 1 through stage 4 chronic kidney disease, or unspecified chronic kidney disease: Secondary | ICD-10-CM | POA: Diagnosis not present

## 2019-11-30 DIAGNOSIS — M1009 Idiopathic gout, multiple sites: Secondary | ICD-10-CM | POA: Diagnosis not present

## 2019-11-30 DIAGNOSIS — I5042 Chronic combined systolic (congestive) and diastolic (congestive) heart failure: Secondary | ICD-10-CM | POA: Diagnosis not present

## 2019-11-30 DIAGNOSIS — D631 Anemia in chronic kidney disease: Secondary | ICD-10-CM | POA: Diagnosis not present

## 2019-11-30 DIAGNOSIS — N1831 Chronic kidney disease, stage 3a: Secondary | ICD-10-CM | POA: Diagnosis not present

## 2019-11-30 DIAGNOSIS — E1122 Type 2 diabetes mellitus with diabetic chronic kidney disease: Secondary | ICD-10-CM | POA: Diagnosis not present

## 2019-12-01 DIAGNOSIS — N1831 Chronic kidney disease, stage 3a: Secondary | ICD-10-CM | POA: Diagnosis not present

## 2019-12-01 DIAGNOSIS — I5042 Chronic combined systolic (congestive) and diastolic (congestive) heart failure: Secondary | ICD-10-CM | POA: Diagnosis not present

## 2019-12-01 DIAGNOSIS — E1122 Type 2 diabetes mellitus with diabetic chronic kidney disease: Secondary | ICD-10-CM | POA: Diagnosis not present

## 2019-12-01 DIAGNOSIS — M1009 Idiopathic gout, multiple sites: Secondary | ICD-10-CM | POA: Diagnosis not present

## 2019-12-01 DIAGNOSIS — D631 Anemia in chronic kidney disease: Secondary | ICD-10-CM | POA: Diagnosis not present

## 2019-12-01 DIAGNOSIS — I130 Hypertensive heart and chronic kidney disease with heart failure and stage 1 through stage 4 chronic kidney disease, or unspecified chronic kidney disease: Secondary | ICD-10-CM | POA: Diagnosis not present

## 2019-12-05 DIAGNOSIS — Z7984 Long term (current) use of oral hypoglycemic drugs: Secondary | ICD-10-CM | POA: Diagnosis not present

## 2019-12-05 DIAGNOSIS — I130 Hypertensive heart and chronic kidney disease with heart failure and stage 1 through stage 4 chronic kidney disease, or unspecified chronic kidney disease: Secondary | ICD-10-CM | POA: Diagnosis not present

## 2019-12-05 DIAGNOSIS — Z86718 Personal history of other venous thrombosis and embolism: Secondary | ICD-10-CM | POA: Diagnosis not present

## 2019-12-05 DIAGNOSIS — E782 Mixed hyperlipidemia: Secondary | ICD-10-CM | POA: Diagnosis not present

## 2019-12-05 DIAGNOSIS — Z7982 Long term (current) use of aspirin: Secondary | ICD-10-CM | POA: Diagnosis not present

## 2019-12-05 DIAGNOSIS — Z9181 History of falling: Secondary | ICD-10-CM | POA: Diagnosis not present

## 2019-12-05 DIAGNOSIS — R339 Retention of urine, unspecified: Secondary | ICD-10-CM | POA: Diagnosis not present

## 2019-12-05 DIAGNOSIS — M47816 Spondylosis without myelopathy or radiculopathy, lumbar region: Secondary | ICD-10-CM | POA: Diagnosis not present

## 2019-12-05 DIAGNOSIS — M5441 Lumbago with sciatica, right side: Secondary | ICD-10-CM | POA: Diagnosis not present

## 2019-12-05 DIAGNOSIS — I5042 Chronic combined systolic (congestive) and diastolic (congestive) heart failure: Secondary | ICD-10-CM | POA: Diagnosis not present

## 2019-12-05 DIAGNOSIS — N1831 Chronic kidney disease, stage 3a: Secondary | ICD-10-CM | POA: Diagnosis not present

## 2019-12-05 DIAGNOSIS — Z6841 Body Mass Index (BMI) 40.0 and over, adult: Secondary | ICD-10-CM | POA: Diagnosis not present

## 2019-12-05 DIAGNOSIS — M5442 Lumbago with sciatica, left side: Secondary | ICD-10-CM | POA: Diagnosis not present

## 2019-12-05 DIAGNOSIS — E1142 Type 2 diabetes mellitus with diabetic polyneuropathy: Secondary | ICD-10-CM | POA: Diagnosis not present

## 2019-12-05 DIAGNOSIS — E875 Hyperkalemia: Secondary | ICD-10-CM | POA: Diagnosis not present

## 2019-12-05 DIAGNOSIS — D631 Anemia in chronic kidney disease: Secondary | ICD-10-CM | POA: Diagnosis not present

## 2019-12-05 DIAGNOSIS — M5136 Other intervertebral disc degeneration, lumbar region: Secondary | ICD-10-CM | POA: Diagnosis not present

## 2019-12-05 DIAGNOSIS — G4733 Obstructive sleep apnea (adult) (pediatric): Secondary | ICD-10-CM | POA: Diagnosis not present

## 2019-12-05 DIAGNOSIS — I2729 Other secondary pulmonary hypertension: Secondary | ICD-10-CM | POA: Diagnosis not present

## 2019-12-05 DIAGNOSIS — E1165 Type 2 diabetes mellitus with hyperglycemia: Secondary | ICD-10-CM | POA: Diagnosis not present

## 2019-12-05 DIAGNOSIS — Z794 Long term (current) use of insulin: Secondary | ICD-10-CM | POA: Diagnosis not present

## 2019-12-05 DIAGNOSIS — M1009 Idiopathic gout, multiple sites: Secondary | ICD-10-CM | POA: Diagnosis not present

## 2019-12-05 DIAGNOSIS — W19XXXD Unspecified fall, subsequent encounter: Secondary | ICD-10-CM | POA: Diagnosis not present

## 2019-12-05 DIAGNOSIS — E1122 Type 2 diabetes mellitus with diabetic chronic kidney disease: Secondary | ICD-10-CM | POA: Diagnosis not present

## 2019-12-06 DIAGNOSIS — I130 Hypertensive heart and chronic kidney disease with heart failure and stage 1 through stage 4 chronic kidney disease, or unspecified chronic kidney disease: Secondary | ICD-10-CM | POA: Diagnosis not present

## 2019-12-06 DIAGNOSIS — I5042 Chronic combined systolic (congestive) and diastolic (congestive) heart failure: Secondary | ICD-10-CM | POA: Diagnosis not present

## 2019-12-06 DIAGNOSIS — E1122 Type 2 diabetes mellitus with diabetic chronic kidney disease: Secondary | ICD-10-CM | POA: Diagnosis not present

## 2019-12-06 DIAGNOSIS — D631 Anemia in chronic kidney disease: Secondary | ICD-10-CM | POA: Diagnosis not present

## 2019-12-06 DIAGNOSIS — M1009 Idiopathic gout, multiple sites: Secondary | ICD-10-CM | POA: Diagnosis not present

## 2019-12-06 DIAGNOSIS — N1831 Chronic kidney disease, stage 3a: Secondary | ICD-10-CM | POA: Diagnosis not present

## 2019-12-07 DIAGNOSIS — I130 Hypertensive heart and chronic kidney disease with heart failure and stage 1 through stage 4 chronic kidney disease, or unspecified chronic kidney disease: Secondary | ICD-10-CM | POA: Diagnosis not present

## 2019-12-07 DIAGNOSIS — D631 Anemia in chronic kidney disease: Secondary | ICD-10-CM | POA: Diagnosis not present

## 2019-12-07 DIAGNOSIS — N1831 Chronic kidney disease, stage 3a: Secondary | ICD-10-CM | POA: Diagnosis not present

## 2019-12-07 DIAGNOSIS — E1122 Type 2 diabetes mellitus with diabetic chronic kidney disease: Secondary | ICD-10-CM | POA: Diagnosis not present

## 2019-12-07 DIAGNOSIS — M1009 Idiopathic gout, multiple sites: Secondary | ICD-10-CM | POA: Diagnosis not present

## 2019-12-07 DIAGNOSIS — I5042 Chronic combined systolic (congestive) and diastolic (congestive) heart failure: Secondary | ICD-10-CM | POA: Diagnosis not present

## 2019-12-08 ENCOUNTER — Other Ambulatory Visit: Payer: Self-pay

## 2019-12-08 MED ORDER — ELIQUIS 5 MG PO TABS: 5.0000 mg | ORAL_TABLET | Freq: Two times a day (BID) | ORAL | 0 refills | Status: DC

## 2019-12-09 DIAGNOSIS — D631 Anemia in chronic kidney disease: Secondary | ICD-10-CM | POA: Diagnosis not present

## 2019-12-09 DIAGNOSIS — E1122 Type 2 diabetes mellitus with diabetic chronic kidney disease: Secondary | ICD-10-CM | POA: Diagnosis not present

## 2019-12-09 DIAGNOSIS — N1831 Chronic kidney disease, stage 3a: Secondary | ICD-10-CM | POA: Diagnosis not present

## 2019-12-09 DIAGNOSIS — I5042 Chronic combined systolic (congestive) and diastolic (congestive) heart failure: Secondary | ICD-10-CM | POA: Diagnosis not present

## 2019-12-09 DIAGNOSIS — M1009 Idiopathic gout, multiple sites: Secondary | ICD-10-CM | POA: Diagnosis not present

## 2019-12-09 DIAGNOSIS — I130 Hypertensive heart and chronic kidney disease with heart failure and stage 1 through stage 4 chronic kidney disease, or unspecified chronic kidney disease: Secondary | ICD-10-CM | POA: Diagnosis not present

## 2019-12-12 ENCOUNTER — Ambulatory Visit: Payer: Self-pay

## 2019-12-12 ENCOUNTER — Other Ambulatory Visit: Payer: Self-pay

## 2019-12-12 ENCOUNTER — Telehealth: Payer: Medicare Other

## 2019-12-12 DIAGNOSIS — D631 Anemia in chronic kidney disease: Secondary | ICD-10-CM | POA: Diagnosis not present

## 2019-12-12 DIAGNOSIS — M1009 Idiopathic gout, multiple sites: Secondary | ICD-10-CM | POA: Diagnosis not present

## 2019-12-12 DIAGNOSIS — Z794 Long term (current) use of insulin: Secondary | ICD-10-CM

## 2019-12-12 DIAGNOSIS — G4733 Obstructive sleep apnea (adult) (pediatric): Secondary | ICD-10-CM

## 2019-12-12 DIAGNOSIS — I272 Pulmonary hypertension, unspecified: Secondary | ICD-10-CM

## 2019-12-12 DIAGNOSIS — E1122 Type 2 diabetes mellitus with diabetic chronic kidney disease: Secondary | ICD-10-CM | POA: Diagnosis not present

## 2019-12-12 DIAGNOSIS — N1831 Chronic kidney disease, stage 3a: Secondary | ICD-10-CM | POA: Diagnosis not present

## 2019-12-12 DIAGNOSIS — I130 Hypertensive heart and chronic kidney disease with heart failure and stage 1 through stage 4 chronic kidney disease, or unspecified chronic kidney disease: Secondary | ICD-10-CM | POA: Diagnosis not present

## 2019-12-12 DIAGNOSIS — I5042 Chronic combined systolic (congestive) and diastolic (congestive) heart failure: Secondary | ICD-10-CM | POA: Diagnosis not present

## 2019-12-12 DIAGNOSIS — N183 Chronic kidney disease, stage 3 unspecified: Secondary | ICD-10-CM

## 2019-12-12 MED ORDER — ACCU-CHEK GUIDE W/DEVICE KIT: PACK | 1 refills | Status: AC

## 2019-12-13 ENCOUNTER — Telehealth: Payer: Self-pay

## 2019-12-13 ENCOUNTER — Ambulatory Visit: Payer: Medicare Other

## 2019-12-13 DIAGNOSIS — N183 Chronic kidney disease, stage 3 unspecified: Secondary | ICD-10-CM

## 2019-12-13 DIAGNOSIS — Z794 Long term (current) use of insulin: Secondary | ICD-10-CM

## 2019-12-13 DIAGNOSIS — I272 Pulmonary hypertension, unspecified: Secondary | ICD-10-CM

## 2019-12-13 NOTE — Chronic Care Management (AMB) (Signed)
Chronic Care Management    Social Work Follow Up Note  12/13/2019 Name: Anna Clay MRN: [id number] DOB: 1950/08/19  Anna Clay is a 69 y.o. year old female who is a primary care patient of Anna Chard, MD. The CCM team was consulted for assistance with care coordination.   Review of patient status, including review of consultants reports, other relevant assessments, and collaboration with appropriate care team members and the patient's provider was performed as part of comprehensive patient evaluation and provision of chronic care management services.    SDOH (Social Determinants of Health) assessments performed: No    Outpatient Encounter Medications as of 12/13/2019  Medication Sig  . Accu-Chek FastClix Lancets MISC USE AS DIRECTED TO CHECK BLOOD SUGARS 2 TIMES PER DAY DX:E11.65  . acetaminophen (TYLENOL) 650 MG CR tablet Take 1,300 mg by mouth every 8 (eight) hours as needed for pain.  Marland Kitchen allopurinol (ZYLOPRIM) 100 MG tablet Take 1 tablet (100 mg total) by mouth daily.  Marland Kitchen aspirin EC 81 MG tablet Take 81 mg by mouth daily.  . Blood Glucose Monitoring Suppl (ACCU-CHEK GUIDE) w/Device KIT USE AS DIRECTED TO CHECK BLOOD SUGARS 2 TIMES PER DAY DX:E11.65  . calcium carbonate (TUMS - DOSED IN MG ELEMENTAL CALCIUM) 500 MG chewable tablet Chew 2 tablets by mouth daily. 2 per day   . Cholecalciferol (VITAMIN D) 2000 UNITS CAPS Take 1 capsule by mouth daily.  . colchicine 0.6 MG tablet Take 1 tablet (0.6 mg total) by mouth daily. (Patient taking differently: Take 0.6 mg by mouth daily. PRN for gout flare)  . ELIQUIS 5 MG TABS tablet Take 1 tablet (5 mg total) by mouth 2 (two) times daily.  Marland Kitchen EPINEPHrine (EPIPEN 2-PAK) 0.3 mg/0.3 mL IJ SOAJ injection Inject 0.3 mLs (0.3 mg total) into the muscle as needed for anaphylaxis.  . ferrous sulfate 325 (65 FE) MG EC tablet Take 325 mg by mouth daily.  . fexofenadine (ALLEGRA) 180 MG tablet Take 180 mg by mouth daily.  . folic acid (FOLVITE) 1 MG  tablet TAKE ONE TABLET BY MOUTH DAILY  . gabapentin (NEURONTIN) 300 MG capsule 100 mg 3 times per day  . insulin glargine, 1 Unit Dial, (TOUJEO SOLOSTAR) 300 UNIT/ML Solostar Pen Inject 58 Units into the skin at bedtime. (Patient taking differently: Inject 25 Units into the skin at bedtime. )  . insulin lispro (HUMALOG KWIKPEN) 200 UNIT/ML KwikPen 6 units with breakfast, 6 units with lunch, and 8 units with dinner (Patient taking differently: 10 units with breakfast, 10 units with lunch, and 14 units with dinner)  . Insulin Pen Needle (NOVOFINE) 32G X 6 MM MISC USE 5 TO 8 NEEDLES PER DAY AS DIRECTED PER INSULIN PROTOCOL Dx code:e11.65  . magnesium oxide (MAG-OX) 400 MG tablet Take 400 mg by mouth daily.  . metoprolol succinate (TOPROL-XL) 25 MG 24 hr tablet Take 1 tablet (25 mg total) by mouth daily.  . Multiple Vitamin (MULTIVITAMIN WITH MINERALS) TABS tablet Take 1 tablet by mouth daily.  . nitroGLYCERIN (NITROSTAT) 0.4 MG SL tablet Place 1 tablet (0.4 mg total) under the tongue every 5 (five) minutes as needed for chest pain.  Marland Kitchen oxyCODONE (OXY IR/ROXICODONE) 5 MG immediate release tablet Take by mouth. (Patient not taking: Reported on 08/08/2019)  . pantoprazole (PROTONIX) 40 MG tablet Take 1 tablet (40 mg total) by mouth daily.  . Pitavastatin Calcium 4 MG TABS Take 1 tablet (4 mg total) by mouth daily.  . Potassium Gluconate 550 MG TABS  Take 1 tablet by mouth daily.  Marland Kitchen spironolactone (ALDACTONE) 25 MG tablet Take 1 tablet (25 mg total) by mouth daily.  Marland Kitchen torsemide (DEMADEX) 20 MG tablet Take 1 tablet (20 mg total) by mouth daily.  . vitamin E (VITAMIN E) 180 MG (400 UNITS) capsule Take 400 Units by mouth daily.   No facility-administered encounter medications on file as of 12/13/2019.     Patient Care Plan: Social Work    Problem Identified: Care Coordination     Goal: Food Insecurity   Start Date: 12/13/2019  Priority: High  Note:   Current Barriers:  . Limited social  support . Limited access to food . Social Isolation . Inability to perform IADL's independently . Chronic conditions including DM II with Stage III CKD and Pulmonary HTN which put patient at increased risk for hospitalization  Social Work Clinical Goal(s):  Marland Kitchen Over the next 30 days, patient will work with SW to address concerns related to meal preparation  CCM SW Interventions: Completed 12/13/19 . Inter-disciplinary care team collaboration (see longitudinal plan of care) . Collaboration with RN Care Manager who indicates patient recently returned home from rehab and is having difficulty with meal preparation. Patient lives with her son and daughter in law but they do not always prepare food that is appropriate for the patients medical needs . Outbound call placed to the patient to discuss nutritional needs. The patient reports her son and daughter in law provide dinner each night but it is not always diabetic friendly and is often very high in sodium . Patient reports she is in the home alone throughout the day and it is up to her to prepare her own breakfast and lunch "I wanted eggs this morning with coffee and toast but only had coffee and toast because I did not have the energy to cook my own eggs" . Discussed referral to [facility] on Wheels program - patient in agreement and understands there may be a wait list - patient is aware she will receive a call from [place] to complete intake once referral is received . Advised the patient SW has also placed a referral to [place] [facility] program to provide 30 meals   Patient Goals/Self-Care Activities Over the next 30 days, patient will:   - Contact SW as needed prior to next scheduled call . Take Medication as prescribed . Contact provider office as needed . Follow up with [facility] as needed regarding Meals on Wheels program  Follow up Plan: SW will follow up with the patient over the next 10 days        Follow Up Plan: SW will follow up with patient by phone over the next 10 days.   Daneen Schick, BSW, CDP Social Worker, Certified Dementia Practitioner [place] [facility] [place] [facility] [phone]  Total time spent performing care coordination and/or care management activities with the patient by phone or face to face = 20 minutes.

## 2019-12-13 NOTE — Patient Instructions (Signed)
Patient Goals/Self-Care Activities Over the next 30 days, patient will:   - Contact SW as needed prior to next scheduled call . Take Medication as prescribed . Contact provider office as needed . Follow up with [facility] as needed [employer] on [address]

## 2019-12-13 NOTE — Telephone Encounter (Signed)
  Chronic Care Management   Outreach Note  12/13/2019 Name: Anna Clay MRN: [id number] DOB: Apr 17, 1950  Referred by: Glendale Chard, MD Reason for referral : Care Coordination   An unsuccessful telephone outreach was attempted today. The patient was referred to the case management team for assistance with care management and care coordination.   Follow Up Plan: A HIPAA compliant phone message was left for the patient providing contact information and requesting a return call.  The care management team will reach out to the patient again over the next 10 days.   Daneen Schick, BSW, CDP Social Worker, Certified Dementia Practitioner [place] [facility] [place] [facility] [phone]

## 2019-12-14 DIAGNOSIS — M1009 Idiopathic gout, multiple sites: Secondary | ICD-10-CM | POA: Diagnosis not present

## 2019-12-14 DIAGNOSIS — E1122 Type 2 diabetes mellitus with diabetic chronic kidney disease: Secondary | ICD-10-CM | POA: Diagnosis not present

## 2019-12-14 DIAGNOSIS — D631 Anemia in chronic kidney disease: Secondary | ICD-10-CM | POA: Diagnosis not present

## 2019-12-14 DIAGNOSIS — I5042 Chronic combined systolic (congestive) and diastolic (congestive) heart failure: Secondary | ICD-10-CM | POA: Diagnosis not present

## 2019-12-14 DIAGNOSIS — I130 Hypertensive heart and chronic kidney disease with heart failure and stage 1 through stage 4 chronic kidney disease, or unspecified chronic kidney disease: Secondary | ICD-10-CM | POA: Diagnosis not present

## 2019-12-14 DIAGNOSIS — N1831 Chronic kidney disease, stage 3a: Secondary | ICD-10-CM | POA: Diagnosis not present

## 2019-12-14 NOTE — Chronic Care Management (AMB) (Signed)
Chronic Care Management   Follow Up Note   12/12/2019 Name: Anna Clay MRN: [id number] DOB: 11/07/1950  Referred by: Glendale Chard, MD Reason for referral : Chronic Care Management (RNCM FU Call)   Anna Clay is a 69 y.o. year old female who is a primary care patient of Glendale Chard, MD. The CCM team was consulted for assistance with chronic disease management and care coordination needs.    Review of patient status, including review of consultants reports, relevant laboratory and other test results, and collaboration with appropriate care team members and the patient's provider was performed as part of comprehensive patient evaluation and provision of chronic care management services.    SDOH (Social Determinants of Health) assessments performed: Yes See Care Plan activities for detailed interventions related to [place])  Placed outbound CCM RN CM follow up call to patient for a post d/c update.      Outpatient Encounter Medications as of 12/12/2019  Medication Sig  . Accu-Chek FastClix Lancets MISC USE AS DIRECTED TO CHECK BLOOD SUGARS 2 TIMES PER DAY DX:E11.65  . acetaminophen (TYLENOL) 650 MG CR tablet Take 1,300 mg by mouth every 8 (eight) hours as needed for pain.  Marland Kitchen allopurinol (ZYLOPRIM) 100 MG tablet Take 1 tablet (100 mg total) by mouth daily.  Marland Kitchen aspirin EC 81 MG tablet Take 81 mg by mouth daily.  . Blood Glucose Monitoring Suppl (ACCU-CHEK GUIDE) w/Device KIT USE AS DIRECTED TO CHECK BLOOD SUGARS 2 TIMES PER DAY DX:E11.65  . calcium carbonate (TUMS - DOSED IN MG ELEMENTAL CALCIUM) 500 MG chewable tablet Chew 2 tablets by mouth daily. 2 per day   . Cholecalciferol (VITAMIN D) 2000 UNITS CAPS Take 1 capsule by mouth daily.  . colchicine 0.6 MG tablet Take 1 tablet (0.6 mg total) by mouth daily. (Patient taking differently: Take 0.6 mg by mouth daily. PRN for gout flare)  . ELIQUIS 5 MG TABS tablet Take 1 tablet (5 mg total) by mouth 2 (two) times daily.  Marland Kitchen EPINEPHrine  (EPIPEN 2-PAK) 0.3 mg/0.3 mL IJ SOAJ injection Inject 0.3 mLs (0.3 mg total) into the muscle as needed for anaphylaxis.  . ferrous sulfate 325 (65 FE) MG EC tablet Take 325 mg by mouth daily.  . fexofenadine (ALLEGRA) 180 MG tablet Take 180 mg by mouth daily.  . folic acid (FOLVITE) 1 MG tablet TAKE ONE TABLET BY MOUTH DAILY  . gabapentin (NEURONTIN) 300 MG capsule 100 mg 3 times per day  . insulin glargine, 1 Unit Dial, (TOUJEO SOLOSTAR) 300 UNIT/ML Solostar Pen Inject 58 Units into the skin at bedtime. (Patient taking differently: Inject 25 Units into the skin at bedtime. )  . insulin lispro (HUMALOG KWIKPEN) 200 UNIT/ML KwikPen 6 units with breakfast, 6 units with lunch, and 8 units with dinner (Patient taking differently: 10 units with breakfast, 10 units with lunch, and 14 units with dinner)  . Insulin Pen Needle (NOVOFINE) 32G X 6 MM MISC USE 5 TO 8 NEEDLES PER DAY AS DIRECTED PER INSULIN PROTOCOL Dx code:e11.65  . magnesium oxide (MAG-OX) 400 MG tablet Take 400 mg by mouth daily.  . metoprolol succinate (TOPROL-XL) 25 MG 24 hr tablet Take 1 tablet (25 mg total) by mouth daily.  . Multiple Vitamin (MULTIVITAMIN WITH MINERALS) TABS tablet Take 1 tablet by mouth daily.  . nitroGLYCERIN (NITROSTAT) 0.4 MG SL tablet Place 1 tablet (0.4 mg total) under the tongue every 5 (five) minutes as needed for chest pain.  Marland Kitchen oxyCODONE (OXY IR/ROXICODONE) 5  MG immediate release tablet Take by mouth. (Patient not taking: Reported on 08/08/2019)  . pantoprazole (PROTONIX) 40 MG tablet Take 1 tablet (40 mg total) by mouth daily.  . Pitavastatin Calcium 4 MG TABS Take 1 tablet (4 mg total) by mouth daily.  . Potassium Gluconate 550 MG TABS Take 1 tablet by mouth daily.  Marland Kitchen spironolactone (ALDACTONE) 25 MG tablet Take 1 tablet (25 mg total) by mouth daily.  Marland Kitchen torsemide (DEMADEX) 20 MG tablet Take 1 tablet (20 mg total) by mouth daily.  . vitamin E (VITAMIN E) 180 MG (400 UNITS) capsule Take 400 Units by mouth  daily.  . [DISCONTINUED] glucose blood (CONTOUR NEXT TEST) test strip USE AS INSTRUCTED TO CHECK BLOOD SUGAR TWICE A DAY   No facility-administered encounter medications on file as of 12/12/2019.     Objective:  Lab Results  Component Value Date   HGBA1C 6.9 (H) 04/06/2019   HGBA1C 7.0 (H) 12/29/2018   HGBA1C 8.0 (H) 08/18/2018   Lab Results  Component Value Date   MICROALBUR 150 04/06/2019   LDLCALC 110 (H) 04/06/2019   CREATININE 1.37 (H) 04/06/2019   BP Readings from Last 3 Encounters:  06/21/19 (!) 146/88  04/06/19 138/78  04/06/19 138/78    Goals Addressed      Patient Stated   .  "to be able to walk out of the house and do some Christmas shopping" (pt-stated)        [place] (see longitudinal plan of care for additional care plan information)  Current Barriers:  . Chronic Disease Management support and education needs related to DM II, Pulmonary Hypertension, OSA  Nurse Case Manager Clinical Goal(s):  Marland Kitchen Over the next 90 days, patient will work with the CCM team and PCP  to address needs related to disease education and support for improved Self Health management of Impaired Physical Mobility and Poor Gait   CCM RN CM Interventions:  12/12/19 call completed with patient  . Inter-disciplinary care team collaboration (see longitudinal plan of care) . Evaluation of current treatment plan related to Rehabilitation and patient's adherence to plan as established by provider . Assessed for status of patient's impaired physical mobility following several hospital admissions and inpatient rehabilitation  . Determined patient is progressing toward wells and meeting her OT/PT goals as evidence by patient reports having increased endurance, strength and the ability to walk short distances without aid; Determined she is working on stair climbing as directed by PT; Determined patient has been discharged by OT due to meeting all goals related to upper body rehabilitation   . Determined patient is home alone during the daytime while her family works, she uses the "[employer]" Echo dot system to call for help if needed and keeps her cell phone with her at all times . Determined patient could benefit from having home delivered meals due to her limitations are preventing her ability to prepare meals . Collaborated with embedded BSW Daneen Schick regarding patient's request to initiate home delivered meals  . Discussed plans with patient for ongoing care management follow up and provided patient with direct contact information for care management team  Patient Self Care Activities:  . Self administers medications as prescribed . Attend all scheduled provider appointments . Call pharmacy for medication refills . Call provider office for new concerns or questions . Supportive family to assist with care needs . Unable to perform ADLs independently . Unable to perform IADLs independently  Initial goal documentation     .  COMPLETED: "to continue to get stronger following my back surgery" (pt-stated)        CARE PLAN ENTRY (see longitudinal plan of care for additional care plan information)  Current Barriers:  Marland Kitchen Knowledge Deficits related to treatment management of Spondylosis of lumbar, Lumbar facet arthropathy and DDD lumbar . Chronic Disease Management support and education needs related to DM II, Pulmonary Hypertension, OSA  Nurse Case Manager Clinical Goal(s):  Marland Kitchen Over the next 90 days, patient will work with CCM team and PCP to address needs related to disease education and support to improve Self Health management of chronic Spondylosis of lumbar, Lumbar facet arthropathy and DDD of her lumbar   Goal Outcome Unknown  CCM RN CM Interventions:  10/19/19 CCM Chart Review  . Inter-disciplinary care team collaboration (see longitudinal plan of care) . Evaluation of current treatment plan related to Spondylosis of lumbar and DDD, arthropathy of lumbar and patient's  adherence to plan as established by provider . Per review of patient record, patient remains to be admitted to [facility]  Patient Self Care Activities:  . Self administers medications as prescribed . Attends all scheduled provider appointments . Calls pharmacy for medication refills . Calls provider office for new concerns or questions  Please see past updates related to this goal by clicking on the "Past Updates" button in the selected goal        Patient Care Plan: Social Work    Problem Identified: Care Coordination     Goal: Food Insecurity   Start Date: 12/13/2019  Priority: High  Note:   Current Barriers:  . Limited social support . Limited access to food . Social Isolation . Inability to perform IADL's independently . Chronic conditions including DM II with Stage III CKD and Pulmonary HTN which put patient at increased risk for hospitalization  Social Work Clinical Goal(s):  Marland Kitchen Over the next 30 days, patient will work with SW to address concerns related to meal preparation  CCM SW Interventions: Completed 12/13/19 . Inter-disciplinary care team collaboration (see longitudinal plan of care) . Collaboration with RN Care Manager who indicates patient recently returned home from rehab and is having difficulty with meal preparation. Patient lives with her son and daughter in law but they do not always prepare food that is appropriate for the patients medical needs . Outbound call placed to the patient to discuss nutritional needs. The patient reports her son and daughter in law provide dinner each night but it is not always diabetic friendly and is often very high in sodium . Patient reports she is in the home alone throughout the day and it is up to her to prepare her own breakfast and lunch "I wanted eggs this morning with coffee and toast but only had coffee and toast because I did not have the energy to cook my own eggs" . Discussed referral to [facility] on Wheels program - patient in agreement and understands there may be a wait list - patient is aware she will receive a call from [employer] to complete intake once referral is received . Advised the patient SW has also placed a referral to [employer] for Mom program to provide 30 meals   Patient Goals/Self-Care Activities Over the next 30 days, patient will:   - Contact SW as needed prior to next scheduled call . Take Medication as prescribed . Contact provider office as needed . Follow up with [facility] as needed regarding  Meals on Wheels program  Follow up Plan: SW will follow up with the patient over the next 10 days     Plan:   Telephone follow up appointment with care management team member scheduled for: 01/26/19  [name], RN, BSN, CCM Care Management Coordinator [place] [facility]  Direct Phone: [phone]

## 2019-12-14 NOTE — Patient Instructions (Addendum)
Visit Information  Goals Addressed      Patient Stated   .  "to be able to walk out of the house and do some Christmas shopping" (pt-stated)        [place] (see longitudinal plan of care for additional care plan information)  Current Barriers:  . Chronic Disease Management support and education needs related to DM II, Pulmonary Hypertension, OSA  Nurse Case Manager Clinical Goal(s):  Marland Kitchen Over the next 90 days, patient will work with the CCM team and PCP  to address needs related to disease education and support for improved Self Health management of Impaired Physical Mobility and Poor Gait   CCM RN CM Interventions:  12/12/19 call completed with patient  . Inter-disciplinary care team collaboration (see longitudinal plan of care) . Evaluation of current treatment plan related to Rehabilitation and patient's adherence to plan as established by provider . Assessed for status of patient's impaired physical mobility following several hospital admissions and inpatient rehabilitation  . Determined patient is progressing toward wells and meeting her OT/PT goals as evidence by patient reports having increased endurance, strength and the ability to walk short distances without aid; Determined she is working on stair climbing as directed by PT; Determined patient has been discharged by OT due to meeting all goals related to upper body rehabilitation  . Determined patient is home alone during the daytime while her family works, she uses the "[employer]" Echo dot system to call for help if needed and keeps her cell phone with her at all times . Determined patient could benefit from having home delivered meals due to her limitations are preventing her ability to prepare meals . Collaborated with embedded BSW Daneen Schick regarding patient's request to initiate home delivered meals  . Discussed plans with patient for ongoing care management follow up and provided patient with direct contact information  for care management team  Patient Self Care Activities:  . Self administers medications as prescribed . Attend all scheduled provider appointments . Call pharmacy for medication refills . Call provider office for new concerns or questions . Supportive family to assist with care needs . Unable to perform ADLs independently . Unable to perform IADLs independently  Initial goal documentation     .  COMPLETED: "to continue to get stronger following my back surgery" (pt-stated)        CARE PLAN ENTRY (see longitudinal plan of care for additional care plan information)  Current Barriers:  Marland Kitchen Knowledge Deficits related to treatment management of Spondylosis of lumbar, Lumbar facet arthropathy and DDD lumbar . Chronic Disease Management support and education needs related to DM II, Pulmonary Hypertension, OSA  Nurse Case Manager Clinical Goal(s):  Marland Kitchen Over the next 90 days, patient will work with CCM team and PCP to address needs related to disease education and support to improve Self Health management of chronic Spondylosis of lumbar, Lumbar facet arthropathy and DDD of her lumbar   Goal Outcome Unknown  CCM RN CM Interventions:  10/19/19 CCM Chart Review  . Inter-disciplinary care team collaboration (see longitudinal plan of care) . Evaluation of current treatment plan related to Spondylosis of lumbar and DDD, arthropathy of lumbar and patient's adherence to plan as established by provider . Per review of patient record, patient remains to be admitted to [facility] [place] [facility]  Patient Self Care Activities:  . Self administers medications as prescribed . Attends all scheduled provider appointments . Calls pharmacy for medication refills . Calls provider  office for new concerns or questions  Please see past updates related to this goal by clicking on the "Past Updates" button in the selected goal        The patient verbalized understanding of instructions, educational materials,  and care plan provided today and declined offer to receive copy of patient instructions, educational materials, and care plan.   Telephone follow up appointment with care management team member scheduled for:  01/26/20  [name], RN, BSN, CCM Care Management Coordinator [place] [facility]  Direct Phone: [phone]

## 2019-12-19 ENCOUNTER — Ambulatory Visit: Payer: Medicare Other

## 2019-12-19 DIAGNOSIS — Z794 Long term (current) use of insulin: Secondary | ICD-10-CM

## 2019-12-19 DIAGNOSIS — M1009 Idiopathic gout, multiple sites: Secondary | ICD-10-CM | POA: Diagnosis not present

## 2019-12-19 DIAGNOSIS — I272 Pulmonary hypertension, unspecified: Secondary | ICD-10-CM

## 2019-12-19 DIAGNOSIS — D631 Anemia in chronic kidney disease: Secondary | ICD-10-CM | POA: Diagnosis not present

## 2019-12-19 DIAGNOSIS — N1831 Chronic kidney disease, stage 3a: Secondary | ICD-10-CM | POA: Diagnosis not present

## 2019-12-19 DIAGNOSIS — I5042 Chronic combined systolic (congestive) and diastolic (congestive) heart failure: Secondary | ICD-10-CM | POA: Diagnosis not present

## 2019-12-19 DIAGNOSIS — I130 Hypertensive heart and chronic kidney disease with heart failure and stage 1 through stage 4 chronic kidney disease, or unspecified chronic kidney disease: Secondary | ICD-10-CM | POA: Diagnosis not present

## 2019-12-19 DIAGNOSIS — E1122 Type 2 diabetes mellitus with diabetic chronic kidney disease: Secondary | ICD-10-CM | POA: Diagnosis not present

## 2019-12-19 DIAGNOSIS — N183 Chronic kidney disease, stage 3 unspecified: Secondary | ICD-10-CM

## 2019-12-19 NOTE — Patient Instructions (Signed)
Over the next 30 days, patient will:   - Contact SW as needed prior to next scheduled call . Take Medication as prescribed . Contact provider office as needed . Follow up with [facility] as needed [employer] on [address]

## 2019-12-19 NOTE — Chronic Care Management (AMB) (Signed)
Chronic Care Management    Social Work Follow Up Note  12/19/2019 Name: Anna Clay MRN: [id number] DOB: 1950/09/19  Anna Clay is a 69 y.o. year old female who is a primary care patient of Glendale Chard, MD. The CCM team was consulted for assistance with care coordination.   Review of patient status, including review of consultants reports, other relevant assessments, and collaboration with appropriate care team members and the patient's provider was performed as part of comprehensive patient evaluation and provision of chronic care management services.    SDOH (Social Determinants of Health) assessments performed: No    Outpatient Encounter Medications as of 12/19/2019  Medication Sig  . Accu-Chek FastClix Lancets MISC USE AS DIRECTED TO CHECK BLOOD SUGARS 2 TIMES PER DAY DX:E11.65  . acetaminophen (TYLENOL) 650 MG CR tablet Take 1,300 mg by mouth every 8 (eight) hours as needed for pain.  Marland Kitchen allopurinol (ZYLOPRIM) 100 MG tablet Take 1 tablet (100 mg total) by mouth daily.  Marland Kitchen aspirin EC 81 MG tablet Take 81 mg by mouth daily.  . Blood Glucose Monitoring Suppl (ACCU-CHEK GUIDE) w/Device KIT USE AS DIRECTED TO CHECK BLOOD SUGARS 2 TIMES PER DAY DX:E11.65  . calcium carbonate (TUMS - DOSED IN MG ELEMENTAL CALCIUM) 500 MG chewable tablet Chew 2 tablets by mouth daily. 2 per day   . Cholecalciferol (VITAMIN D) 2000 UNITS CAPS Take 1 capsule by mouth daily.  . colchicine 0.6 MG tablet Take 1 tablet (0.6 mg total) by mouth daily. (Patient taking differently: Take 0.6 mg by mouth daily. PRN for gout flare)  . ELIQUIS 5 MG TABS tablet Take 1 tablet (5 mg total) by mouth 2 (two) times daily.  Marland Kitchen EPINEPHrine (EPIPEN 2-PAK) 0.3 mg/0.3 mL IJ SOAJ injection Inject 0.3 mLs (0.3 mg total) into the muscle as needed for anaphylaxis.  . ferrous sulfate 325 (65 FE) MG EC tablet Take 325 mg by mouth daily.  . fexofenadine (ALLEGRA) 180 MG tablet Take 180 mg by mouth daily.  . folic acid (FOLVITE) 1 MG  tablet TAKE ONE TABLET BY MOUTH DAILY  . gabapentin (NEURONTIN) 300 MG capsule 100 mg 3 times per day  . insulin glargine, 1 Unit Dial, (TOUJEO SOLOSTAR) 300 UNIT/ML Solostar Pen Inject 58 Units into the skin at bedtime. (Patient taking differently: Inject 25 Units into the skin at bedtime. )  . insulin lispro (HUMALOG KWIKPEN) 200 UNIT/ML KwikPen 6 units with breakfast, 6 units with lunch, and 8 units with dinner (Patient taking differently: 10 units with breakfast, 10 units with lunch, and 14 units with dinner)  . Insulin Pen Needle (NOVOFINE) 32G X 6 MM MISC USE 5 TO 8 NEEDLES PER DAY AS DIRECTED PER INSULIN PROTOCOL Dx code:e11.65  . magnesium oxide (MAG-OX) 400 MG tablet Take 400 mg by mouth daily.  . metoprolol succinate (TOPROL-XL) 25 MG 24 hr tablet Take 1 tablet (25 mg total) by mouth daily.  . Multiple Vitamin (MULTIVITAMIN WITH MINERALS) TABS tablet Take 1 tablet by mouth daily.  . nitroGLYCERIN (NITROSTAT) 0.4 MG SL tablet Place 1 tablet (0.4 mg total) under the tongue every 5 (five) minutes as needed for chest pain.  Marland Kitchen oxyCODONE (OXY IR/ROXICODONE) 5 MG immediate release tablet Take by mouth. (Patient not taking: Reported on 08/08/2019)  . pantoprazole (PROTONIX) 40 MG tablet Take 1 tablet (40 mg total) by mouth daily.  . Pitavastatin Calcium 4 MG TABS Take 1 tablet (4 mg total) by mouth daily.  . Potassium Gluconate 550 MG TABS  Take 1 tablet by mouth daily.  Marland Kitchen spironolactone (ALDACTONE) 25 MG tablet Take 1 tablet (25 mg total) by mouth daily.  Marland Kitchen torsemide (DEMADEX) 20 MG tablet Take 1 tablet (20 mg total) by mouth daily.  . vitamin E (VITAMIN E) 180 MG (400 UNITS) capsule Take 400 Units by mouth daily.   No facility-administered encounter medications on file as of 12/19/2019.     Patient Care Plan: Social Work    Problem Identified: Care Coordination     Goal: Food Insecurity   Start Date: 12/13/2019  Priority: High  Note:   Current Barriers:  . Limited social  support . Limited access to food . Social Isolation . Inability to perform IADL's independently . Chronic conditions including DM II with Stage III CKD and Pulmonary HTN which put patient at increased risk for hospitalization  Social Work Clinical Goal(s):  Marland Kitchen Over the next 30 days, patient will work with SW to address concerns related to meal preparation  CCM SW Interventions: Completed 12/19/19 . Successful outbound call placed to the patient to confirm receipt of meal delivery . Determined patient has yet to receive meals from [place] or be contacted by [facility] on [address] . Advised the patient SW will reach out to her over the next 5 days to assess goal progression; meals may be taking a bit longer due to Thanksgiving holiday  Completed 12/13/19 . Inter-disciplinary care team collaboration (see longitudinal plan of care) . Collaboration with RN Care Manager who indicates patient recently returned home from rehab and is having difficulty with meal preparation. Patient lives with her son and daughter in law but they do not always prepare food that is appropriate for the patients medical needs . Outbound call placed to the patient to discuss nutritional needs. The patient reports her son and daughter in law provide dinner each night but it is not always diabetic friendly and is often very high in sodium . Patient reports she is in the home alone throughout the day and it is up to her to prepare her own breakfast and lunch "I wanted eggs this morning with coffee and toast but only had coffee and toast because I did not have the energy to cook my own eggs" . Discussed referral to [facility] on Wheels program - patient in agreement and understands there may be a wait list - patient is aware she will receive a call from [place] to complete intake once referral is received . Advised the patient SW has also placed a referral to [place] for Mom program to  provide 30 meals   Patient Goals/Self-Care Activities Over the next 30 days, patient will:   - Contact SW as needed prior to next scheduled call . Take Medication as prescribed . Contact provider office as needed . Follow up with [facility] as needed regarding Meals on Wheels program  Follow up Plan: SW will follow up with the patient over the next 5 days     Daneen Schick, BSW, CDP Social Worker, Certified Dementia Practitioner [place] / [place] [facility] [phone]  Total time spent performing care coordination and/or care management activities with the patient by phone or face to face = 7 minutes.

## 2019-12-20 DIAGNOSIS — E1122 Type 2 diabetes mellitus with diabetic chronic kidney disease: Secondary | ICD-10-CM | POA: Diagnosis not present

## 2019-12-20 DIAGNOSIS — D631 Anemia in chronic kidney disease: Secondary | ICD-10-CM | POA: Diagnosis not present

## 2019-12-20 DIAGNOSIS — I130 Hypertensive heart and chronic kidney disease with heart failure and stage 1 through stage 4 chronic kidney disease, or unspecified chronic kidney disease: Secondary | ICD-10-CM | POA: Diagnosis not present

## 2019-12-20 DIAGNOSIS — M1009 Idiopathic gout, multiple sites: Secondary | ICD-10-CM | POA: Diagnosis not present

## 2019-12-20 DIAGNOSIS — N1831 Chronic kidney disease, stage 3a: Secondary | ICD-10-CM | POA: Diagnosis not present

## 2019-12-20 DIAGNOSIS — I5042 Chronic combined systolic (congestive) and diastolic (congestive) heart failure: Secondary | ICD-10-CM | POA: Diagnosis not present

## 2019-12-21 ENCOUNTER — Telehealth: Payer: Self-pay

## 2019-12-21 ENCOUNTER — Ambulatory Visit: Payer: Medicare Other

## 2019-12-21 DIAGNOSIS — I5042 Chronic combined systolic (congestive) and diastolic (congestive) heart failure: Secondary | ICD-10-CM | POA: Diagnosis not present

## 2019-12-21 DIAGNOSIS — N183 Chronic kidney disease, stage 3 unspecified: Secondary | ICD-10-CM

## 2019-12-21 DIAGNOSIS — M1009 Idiopathic gout, multiple sites: Secondary | ICD-10-CM | POA: Diagnosis not present

## 2019-12-21 DIAGNOSIS — D631 Anemia in chronic kidney disease: Secondary | ICD-10-CM | POA: Diagnosis not present

## 2019-12-21 DIAGNOSIS — N1831 Chronic kidney disease, stage 3a: Secondary | ICD-10-CM | POA: Diagnosis not present

## 2019-12-21 DIAGNOSIS — I10 Essential (primary) hypertension: Secondary | ICD-10-CM

## 2019-12-21 DIAGNOSIS — Z794 Long term (current) use of insulin: Secondary | ICD-10-CM

## 2019-12-21 DIAGNOSIS — E1122 Type 2 diabetes mellitus with diabetic chronic kidney disease: Secondary | ICD-10-CM | POA: Diagnosis not present

## 2019-12-21 DIAGNOSIS — I130 Hypertensive heart and chronic kidney disease with heart failure and stage 1 through stage 4 chronic kidney disease, or unspecified chronic kidney disease: Secondary | ICD-10-CM | POA: Diagnosis not present

## 2019-12-21 NOTE — Patient Instructions (Signed)
Patient Goals/Self-Care Activities Over the next 30 days, patient will:   - Contact SW as needed prior to next scheduled call . Take Medication as prescribed . Contact provider office as needed . Follow up with [facility] as needed [employer] on [address] Completed

## 2019-12-21 NOTE — Chronic Care Management (AMB) (Signed)
Chronic Care Management    Social Work Follow Up Note  12/21/2019 Name: Mikenzi L Bertram MRN: [id number] DOB: 06/06/1950  Maryagnes L Fleischer is a 69 y.o. year old female who is a primary care patient of Sanders, Robyn, MD. The CCM team was consulted for assistance with care coordination.   Review of patient status, including review of consultants reports, other relevant assessments, and collaboration with appropriate care team members and the patient's provider was performed as part of comprehensive patient evaluation and provision of chronic care management services.    SW received inbound return call from the patient.    Outpatient Encounter Medications as of 12/21/2019  Medication Sig  . Accu-Chek FastClix Lancets MISC USE AS DIRECTED TO CHECK BLOOD SUGARS 2 TIMES PER DAY DX:E11.65  . acetaminophen (TYLENOL) 650 MG CR tablet Take 1,300 mg by mouth every 8 (eight) hours as needed for pain.  . allopurinol (ZYLOPRIM) 100 MG tablet Take 1 tablet (100 mg total) by mouth daily.  . aspirin EC 81 MG tablet Take 81 mg by mouth daily.  . Blood Glucose Monitoring Suppl (ACCU-CHEK GUIDE) w/Device KIT USE AS DIRECTED TO CHECK BLOOD SUGARS 2 TIMES PER DAY DX:E11.65  . calcium carbonate (TUMS - DOSED IN MG ELEMENTAL CALCIUM) 500 MG chewable tablet Chew 2 tablets by mouth daily. 2 per day   . Cholecalciferol (VITAMIN D) 2000 UNITS CAPS Take 1 capsule by mouth daily.  . colchicine 0.6 MG tablet Take 1 tablet (0.6 mg total) by mouth daily. (Patient taking differently: Take 0.6 mg by mouth daily. PRN for gout flare)  . ELIQUIS 5 MG TABS tablet Take 1 tablet (5 mg total) by mouth 2 (two) times daily.  . EPINEPHrine (EPIPEN 2-PAK) 0.3 mg/0.3 mL IJ SOAJ injection Inject 0.3 mLs (0.3 mg total) into the muscle as needed for anaphylaxis.  . ferrous sulfate 325 (65 FE) MG EC tablet Take 325 mg by mouth daily.  . fexofenadine (ALLEGRA) 180 MG tablet Take 180 mg by mouth daily.  . folic acid (FOLVITE) 1 MG tablet TAKE ONE  TABLET BY MOUTH DAILY  . gabapentin (NEURONTIN) 300 MG capsule 100 mg 3 times per day  . insulin glargine, 1 Unit Dial, (TOUJEO SOLOSTAR) 300 UNIT/ML Solostar Pen Inject 58 Units into the skin at bedtime. (Patient taking differently: Inject 25 Units into the skin at bedtime. )  . insulin lispro (HUMALOG KWIKPEN) 200 UNIT/ML KwikPen 6 units with breakfast, 6 units with lunch, and 8 units with dinner (Patient taking differently: 10 units with breakfast, 10 units with lunch, and 14 units with dinner)  . Insulin Pen Needle (NOVOFINE) 32G X 6 MM MISC USE 5 TO 8 NEEDLES PER DAY AS DIRECTED PER INSULIN PROTOCOL Dx code:e11.65  . magnesium oxide (MAG-OX) 400 MG tablet Take 400 mg by mouth daily.  . metoprolol succinate (TOPROL-XL) 25 MG 24 hr tablet Take 1 tablet (25 mg total) by mouth daily.  . Multiple Vitamin (MULTIVITAMIN WITH MINERALS) TABS tablet Take 1 tablet by mouth daily.  . nitroGLYCERIN (NITROSTAT) 0.4 MG SL tablet Place 1 tablet (0.4 mg total) under the tongue every 5 (five) minutes as needed for chest pain.  . oxyCODONE (OXY IR/ROXICODONE) 5 MG immediate release tablet Take by mouth. (Patient not taking: Reported on 08/08/2019)  . pantoprazole (PROTONIX) 40 MG tablet Take 1 tablet (40 mg total) by mouth daily.  . Pitavastatin Calcium 4 MG TABS Take 1 tablet (4 mg total) by mouth daily.  . Potassium Gluconate 550 MG TABS   Take 1 tablet by mouth daily.  Marland Kitchen spironolactone (ALDACTONE) 25 MG tablet Take 1 tablet (25 mg total) by mouth daily.  Marland Kitchen torsemide (DEMADEX) 20 MG tablet Take 1 tablet (20 mg total) by mouth daily.  . vitamin E (VITAMIN E) 180 MG (400 UNITS) capsule Take 400 Units by mouth daily.   No facility-administered encounter medications on file as of 12/21/2019.     Patient Care Plan: Social Work    Problem Identified: Care Coordination     Goal: Food Insecurity   Start Date: 12/13/2019  Priority: High  Note:   Current Barriers:  . Limited social support . Limited access to  food . Social Isolation . Inability to perform IADL's independently . Chronic conditions including DM II with Stage III CKD and Pulmonary HTN which put patient at increased risk for hospitalization  Social Work Clinical Goal(s):  Marland Kitchen Over the next 30 days, patient will work with SW to address concerns related to meal preparation  CCM SW Interventions: Completed 12/21/19 . Successful outbound call placed to the patient to assess goal progression . Determined the patient was contacted by [facility] on [address]; unfortunately the patient lives outside the current service area . Patient offered to enroll in a meal pick-up program - program open from 8:30 am - 2:00 pm. Patient reports her son and daughter-in-law are unable to pick up during those times . Discussed patient has yet to receive meal delivery from [employer] . Collaboration with Matilde Bash requesting follow up on status of patient meals . Scheduled follow up call over the next week  Completed 12/19/19 . Successful outbound call placed to the patient to confirm receipt of meal delivery . Determined patient has yet to receive meals from [place] or be contacted by [facility] on [address] . Advised the patient SW will reach out to her over the next 5 days to assess goal progression; meals may be taking a bit longer due to Thanksgiving holiday  Completed 12/13/19 . Inter-disciplinary care team collaboration (see longitudinal plan of care) . Collaboration with RN Care Manager who indicates patient recently returned home from rehab and is having difficulty with meal preparation. Patient lives with her son and daughter in law but they do not always prepare food that is appropriate for the patients medical needs . Outbound call placed to the patient to discuss nutritional needs. The patient reports her son and daughter in law provide dinner each night but it is not always diabetic friendly and is often  very high in sodium . Patient reports she is in the home alone throughout the day and it is up to her to prepare her own breakfast and lunch "I wanted eggs this morning with coffee and toast but only had coffee and toast because I did not have the energy to cook my own eggs" . Discussed referral to [facility] on Wheels program - patient in agreement and understands there may be a wait list - patient is aware she will receive a call from [place] to complete intake once referral is received . Advised the patient SW has also placed a referral to [place] [facility] program to provide 30 meals   Patient Goals/Self-Care Activities Over the next 30 days, patient will:   - Contact SW as needed prior to next scheduled call . Take Medication as prescribed . Contact provider office as needed . Follow up with [facility] as needed regarding Meals  on Wheels program Completed  Follow up Plan: SW will follow up with the patient over the next week      Daneen Schick, BSW, CDP Social Worker, Certified Dementia Practitioner [place] / [place] [facility] [phone]  Total time spent performing care coordination and/or care management activities with the patient by phone or face to face = 10 minutes.

## 2019-12-21 NOTE — Telephone Encounter (Signed)
  Chronic Care Management   Outreach Note  12/21/2019 Name: Anna Clay MRN: [id number] DOB: Feb 09, 1950  Referred by: Glendale Chard, MD Reason for referral : Care Coordination   An unsuccessful telephone outreach was attempted today. The patient was referred to the case management team for assistance with care management and care coordination.   Follow Up Plan: A HIPAA compliant phone message was left for the patient providing contact information and requesting a return call.   Daneen Schick, BSW, CDP Social Worker, Certified Dementia Practitioner [place] / [place] [facility] [phone]

## 2019-12-23 ENCOUNTER — Telehealth: Payer: Medicare Other

## 2019-12-26 DIAGNOSIS — I130 Hypertensive heart and chronic kidney disease with heart failure and stage 1 through stage 4 chronic kidney disease, or unspecified chronic kidney disease: Secondary | ICD-10-CM | POA: Diagnosis not present

## 2019-12-26 DIAGNOSIS — M1009 Idiopathic gout, multiple sites: Secondary | ICD-10-CM | POA: Diagnosis not present

## 2019-12-26 DIAGNOSIS — I5042 Chronic combined systolic (congestive) and diastolic (congestive) heart failure: Secondary | ICD-10-CM | POA: Diagnosis not present

## 2019-12-26 DIAGNOSIS — D631 Anemia in chronic kidney disease: Secondary | ICD-10-CM | POA: Diagnosis not present

## 2019-12-26 DIAGNOSIS — N1831 Chronic kidney disease, stage 3a: Secondary | ICD-10-CM | POA: Diagnosis not present

## 2019-12-26 DIAGNOSIS — E1122 Type 2 diabetes mellitus with diabetic chronic kidney disease: Secondary | ICD-10-CM | POA: Diagnosis not present

## 2019-12-27 ENCOUNTER — Ambulatory Visit: Payer: Medicare Other

## 2019-12-27 ENCOUNTER — Telehealth: Payer: Self-pay

## 2019-12-27 DIAGNOSIS — N183 Chronic kidney disease, stage 3 unspecified: Secondary | ICD-10-CM

## 2019-12-27 DIAGNOSIS — Z794 Long term (current) use of insulin: Secondary | ICD-10-CM

## 2019-12-27 DIAGNOSIS — I10 Essential (primary) hypertension: Secondary | ICD-10-CM

## 2019-12-27 NOTE — Patient Instructions (Signed)
  Goals we discussed today:  Goals Addressed            This Visit's Progress   . Collaborate with SW to address care coordination needs       Timeframe:  Short-Term Goal Priority:  High Start Date:    12/13/19                         Expected End Date: 02/19/19                 Next Expected Date of Contact: 02/01/19  Patient Goals/Self-Care Activities Over the next 30 days, patient will:   - Contact SW as needed prior to next scheduled call . Take Medication as prescribed . Contact provider office as needed . Follow up with [facility] as needed [employer] on [address] Completed . Consume diet specific meals delivered by [employer] to address food insecurity        Follow Up Plan: SW will follow up with patient by phone over the next month.   Daneen Schick, BSW, CDP Social Worker, Certified Dementia Practitioner [place] / [place] [facility] [phone]

## 2019-12-27 NOTE — Chronic Care Management (AMB) (Signed)
Chronic Care Management    Social Work Follow Up Note  12/27/2019 Name: Anna Clay MRN: [id number] DOB: 05-21-1950  Anna Clay is a 69 y.o. year old female who is a primary care patient of Glendale Chard, MD. The CCM team was consulted for assistance with care coordination.   Review of patient status, including review of consultants reports, other relevant assessments, and collaboration with appropriate care team members and the patient's provider was performed as part of comprehensive patient evaluation and provision of chronic care management services.    SDOH (Social Determinants of Health) assessments performed: No    Outpatient Encounter Medications as of 12/27/2019  Medication Sig   Accu-Chek FastClix Lancets MISC USE AS DIRECTED TO CHECK BLOOD SUGARS 2 TIMES PER DAY DX:E11.65   acetaminophen (TYLENOL) 650 MG CR tablet Take 1,300 mg by mouth every 8 (eight) hours as needed for pain.   allopurinol (ZYLOPRIM) 100 MG tablet Take 1 tablet (100 mg total) by mouth daily.   aspirin EC 81 MG tablet Take 81 mg by mouth daily.   Blood Glucose Monitoring Suppl (ACCU-CHEK GUIDE) w/Device KIT USE AS DIRECTED TO CHECK BLOOD SUGARS 2 TIMES PER DAY DX:E11.65   calcium carbonate (TUMS - DOSED IN MG ELEMENTAL CALCIUM) 500 MG chewable tablet Chew 2 tablets by mouth daily. 2 per day    Cholecalciferol (VITAMIN D) 2000 UNITS CAPS Take 1 capsule by mouth daily.   colchicine 0.6 MG tablet Take 1 tablet (0.6 mg total) by mouth daily. (Patient taking differently: Take 0.6 mg by mouth daily. PRN for gout flare)   ELIQUIS 5 MG TABS tablet Take 1 tablet (5 mg total) by mouth 2 (two) times daily.   EPINEPHrine (EPIPEN 2-PAK) 0.3 mg/0.3 mL IJ SOAJ injection Inject 0.3 mLs (0.3 mg total) into the muscle as needed for anaphylaxis.   ferrous sulfate 325 (65 FE) MG EC tablet Take 325 mg by mouth daily.   fexofenadine (ALLEGRA) 180 MG tablet Take 180 mg by mouth daily.   folic acid (FOLVITE) 1 MG  tablet TAKE ONE TABLET BY MOUTH DAILY   gabapentin (NEURONTIN) 300 MG capsule 100 mg 3 times per day   insulin glargine, 1 Unit Dial, (TOUJEO SOLOSTAR) 300 UNIT/ML Solostar Pen Inject 58 Units into the skin at bedtime. (Patient taking differently: Inject 25 Units into the skin at bedtime. )   insulin lispro (HUMALOG KWIKPEN) 200 UNIT/ML KwikPen 6 units with breakfast, 6 units with lunch, and 8 units with dinner (Patient taking differently: 10 units with breakfast, 10 units with lunch, and 14 units with dinner)   Insulin Pen Needle (NOVOFINE) 32G X 6 MM MISC USE 5 TO 8 NEEDLES PER DAY AS DIRECTED PER INSULIN PROTOCOL Dx code:e11.65   magnesium oxide (MAG-OX) 400 MG tablet Take 400 mg by mouth daily.   metoprolol succinate (TOPROL-XL) 25 MG 24 hr tablet Take 1 tablet (25 mg total) by mouth daily.   Multiple Vitamin (MULTIVITAMIN WITH MINERALS) TABS tablet Take 1 tablet by mouth daily.   nitroGLYCERIN (NITROSTAT) 0.4 MG SL tablet Place 1 tablet (0.4 mg total) under the tongue every 5 (five) minutes as needed for chest pain.   oxyCODONE (OXY IR/ROXICODONE) 5 MG immediate release tablet Take by mouth. (Patient not taking: Reported on 08/08/2019)   pantoprazole (PROTONIX) 40 MG tablet Take 1 tablet (40 mg total) by mouth daily.   Pitavastatin Calcium 4 MG TABS Take 1 tablet (4 mg total) by mouth daily.   Potassium Gluconate 550 MG TABS  Take 1 tablet by mouth daily.   spironolactone (ALDACTONE) 25 MG tablet Take 1 tablet (25 mg total) by mouth daily.   torsemide (DEMADEX) 20 MG tablet Take 1 tablet (20 mg total) by mouth daily.   vitamin E (VITAMIN E) 180 MG (400 UNITS) capsule Take 400 Units by mouth daily.   No facility-administered encounter medications on file as of 12/27/2019.     Patient Care Plan: Social Work    Problem Identified: Care Coordination     Goal: Food Insecurity   Start Date: 12/13/2019  Priority: High  Note:   Current Barriers:   Limited social  support  Limited access to food  Social Isolation  Inability to perform IADL's independently  Chronic conditions including DM II with Stage III CKD and Pulmonary HTN which put patient at increased risk for hospitalization  Social Work Clinical Goal(s):   Over the next 30 days, patient will work with SW to address concerns related to meal preparation  CCM SW Interventions: Completed 12/27/19  Collaboration with Lavena Bullion with [facility] who confirms patients meal delivery from [place] is scheduled for 12/28/19  Successful outbound call placed to the patient to advise of meal delivery date  No other acute care coordination needs identified during today's call  Scheduled follow up call over the next month  Completed 12/21/19  Successful outbound call placed to the patient to assess goal progression  Determined the patient was contacted by [facility] on [address]; unfortunately the patient lives outside the current service area  Patient offered to enroll in a meal pick-up program - program open from 8:30 am - 2:00 pm. Patient reports her son and daughter-in-law are unable to pick up during those times  Discussed patient has yet to receive meal delivery from [place] with Matilde Bash requesting follow up on status of patient meals  Scheduled follow up call over the next week   Patient Goals/Self-Care Activities Over the next 30 days, patient will:   - Contact SW as needed prior to next scheduled call  Take Medication as prescribed  Contact provider office as needed  Follow up with [place] as needed regarding Meals on [employer] program Completed  [employer] specific meals delivered by [employer] to address food insecurity   Follow up Plan: SW will follow up with the patient over the next week      Daneen Schick, BSW, CDP Social Worker, Certified Dementia Practitioner [place] / [place]  Management [phone]  Total time spent performing care coordination and/or care management activities with the patient by phone or face to face = 8 minutes.

## 2019-12-27 NOTE — Telephone Encounter (Signed)
Vaughan Basta from [facility] called requesting verbal orders to recertify pt for continued nursing visits. [phone]  I returned her call and gave her orders ok. YL,RMA

## 2019-12-28 DIAGNOSIS — E1122 Type 2 diabetes mellitus with diabetic chronic kidney disease: Secondary | ICD-10-CM | POA: Diagnosis not present

## 2019-12-28 DIAGNOSIS — I5042 Chronic combined systolic (congestive) and diastolic (congestive) heart failure: Secondary | ICD-10-CM | POA: Diagnosis not present

## 2019-12-28 DIAGNOSIS — N1831 Chronic kidney disease, stage 3a: Secondary | ICD-10-CM | POA: Diagnosis not present

## 2019-12-28 DIAGNOSIS — D631 Anemia in chronic kidney disease: Secondary | ICD-10-CM | POA: Diagnosis not present

## 2019-12-28 DIAGNOSIS — I130 Hypertensive heart and chronic kidney disease with heart failure and stage 1 through stage 4 chronic kidney disease, or unspecified chronic kidney disease: Secondary | ICD-10-CM | POA: Diagnosis not present

## 2019-12-28 DIAGNOSIS — M1009 Idiopathic gout, multiple sites: Secondary | ICD-10-CM | POA: Diagnosis not present

## 2020-01-02 DIAGNOSIS — D631 Anemia in chronic kidney disease: Secondary | ICD-10-CM | POA: Diagnosis not present

## 2020-01-02 DIAGNOSIS — N1831 Chronic kidney disease, stage 3a: Secondary | ICD-10-CM | POA: Diagnosis not present

## 2020-01-02 DIAGNOSIS — M1009 Idiopathic gout, multiple sites: Secondary | ICD-10-CM | POA: Diagnosis not present

## 2020-01-02 DIAGNOSIS — E1122 Type 2 diabetes mellitus with diabetic chronic kidney disease: Secondary | ICD-10-CM | POA: Diagnosis not present

## 2020-01-02 DIAGNOSIS — I5042 Chronic combined systolic (congestive) and diastolic (congestive) heart failure: Secondary | ICD-10-CM | POA: Diagnosis not present

## 2020-01-02 DIAGNOSIS — I130 Hypertensive heart and chronic kidney disease with heart failure and stage 1 through stage 4 chronic kidney disease, or unspecified chronic kidney disease: Secondary | ICD-10-CM | POA: Diagnosis not present

## 2020-01-03 ENCOUNTER — Other Ambulatory Visit: Payer: Self-pay

## 2020-01-03 DIAGNOSIS — I5042 Chronic combined systolic (congestive) and diastolic (congestive) heart failure: Secondary | ICD-10-CM | POA: Diagnosis not present

## 2020-01-03 DIAGNOSIS — I130 Hypertensive heart and chronic kidney disease with heart failure and stage 1 through stage 4 chronic kidney disease, or unspecified chronic kidney disease: Secondary | ICD-10-CM | POA: Diagnosis not present

## 2020-01-03 DIAGNOSIS — N1831 Chronic kidney disease, stage 3a: Secondary | ICD-10-CM | POA: Diagnosis not present

## 2020-01-03 DIAGNOSIS — D631 Anemia in chronic kidney disease: Secondary | ICD-10-CM | POA: Diagnosis not present

## 2020-01-03 DIAGNOSIS — E1122 Type 2 diabetes mellitus with diabetic chronic kidney disease: Secondary | ICD-10-CM | POA: Diagnosis not present

## 2020-01-03 DIAGNOSIS — M1009 Idiopathic gout, multiple sites: Secondary | ICD-10-CM | POA: Diagnosis not present

## 2020-01-03 NOTE — Telephone Encounter (Signed)
The pt was asked why she refused a visit with advance home care.  The pt said that the aid never showed up or call, that she has never refused a visit. The pt said that the Aid hasn't been back to her home either.

## 2020-01-04 DIAGNOSIS — Z9989 Dependence on other enabling machines and devices: Secondary | ICD-10-CM | POA: Diagnosis not present

## 2020-01-04 DIAGNOSIS — Z6841 Body Mass Index (BMI) 40.0 and over, adult: Secondary | ICD-10-CM | POA: Diagnosis not present

## 2020-01-04 DIAGNOSIS — M109 Gout, unspecified: Secondary | ICD-10-CM | POA: Diagnosis not present

## 2020-01-04 DIAGNOSIS — Z9981 Dependence on supplemental oxygen: Secondary | ICD-10-CM | POA: Diagnosis not present

## 2020-01-04 DIAGNOSIS — M5136 Other intervertebral disc degeneration, lumbar region: Secondary | ICD-10-CM | POA: Diagnosis not present

## 2020-01-04 DIAGNOSIS — M5442 Lumbago with sciatica, left side: Secondary | ICD-10-CM | POA: Diagnosis not present

## 2020-01-04 DIAGNOSIS — Z7984 Long term (current) use of oral hypoglycemic drugs: Secondary | ICD-10-CM | POA: Diagnosis not present

## 2020-01-04 DIAGNOSIS — Z86718 Personal history of other venous thrombosis and embolism: Secondary | ICD-10-CM | POA: Diagnosis not present

## 2020-01-04 DIAGNOSIS — I5042 Chronic combined systolic (congestive) and diastolic (congestive) heart failure: Secondary | ICD-10-CM | POA: Diagnosis not present

## 2020-01-04 DIAGNOSIS — M47816 Spondylosis without myelopathy or radiculopathy, lumbar region: Secondary | ICD-10-CM | POA: Diagnosis not present

## 2020-01-04 DIAGNOSIS — Z9181 History of falling: Secondary | ICD-10-CM | POA: Diagnosis not present

## 2020-01-04 DIAGNOSIS — R339 Retention of urine, unspecified: Secondary | ICD-10-CM | POA: Diagnosis not present

## 2020-01-04 DIAGNOSIS — E1122 Type 2 diabetes mellitus with diabetic chronic kidney disease: Secondary | ICD-10-CM | POA: Diagnosis not present

## 2020-01-04 DIAGNOSIS — W19XXXD Unspecified fall, subsequent encounter: Secondary | ICD-10-CM | POA: Diagnosis not present

## 2020-01-04 DIAGNOSIS — E782 Mixed hyperlipidemia: Secondary | ICD-10-CM | POA: Diagnosis not present

## 2020-01-04 DIAGNOSIS — I130 Hypertensive heart and chronic kidney disease with heart failure and stage 1 through stage 4 chronic kidney disease, or unspecified chronic kidney disease: Secondary | ICD-10-CM | POA: Diagnosis not present

## 2020-01-04 DIAGNOSIS — E1165 Type 2 diabetes mellitus with hyperglycemia: Secondary | ICD-10-CM | POA: Diagnosis not present

## 2020-01-04 DIAGNOSIS — E1142 Type 2 diabetes mellitus with diabetic polyneuropathy: Secondary | ICD-10-CM | POA: Diagnosis not present

## 2020-01-04 DIAGNOSIS — N1831 Chronic kidney disease, stage 3a: Secondary | ICD-10-CM | POA: Diagnosis not present

## 2020-01-04 DIAGNOSIS — Z7982 Long term (current) use of aspirin: Secondary | ICD-10-CM | POA: Diagnosis not present

## 2020-01-04 DIAGNOSIS — G4733 Obstructive sleep apnea (adult) (pediatric): Secondary | ICD-10-CM | POA: Diagnosis not present

## 2020-01-04 DIAGNOSIS — I2729 Other secondary pulmonary hypertension: Secondary | ICD-10-CM | POA: Diagnosis not present

## 2020-01-04 DIAGNOSIS — D631 Anemia in chronic kidney disease: Secondary | ICD-10-CM | POA: Diagnosis not present

## 2020-01-04 DIAGNOSIS — M5441 Lumbago with sciatica, right side: Secondary | ICD-10-CM | POA: Diagnosis not present

## 2020-01-04 DIAGNOSIS — M1009 Idiopathic gout, multiple sites: Secondary | ICD-10-CM | POA: Diagnosis not present

## 2020-01-04 DIAGNOSIS — Z794 Long term (current) use of insulin: Secondary | ICD-10-CM | POA: Diagnosis not present

## 2020-01-04 DIAGNOSIS — E875 Hyperkalemia: Secondary | ICD-10-CM | POA: Diagnosis not present

## 2020-01-11 DIAGNOSIS — N1831 Chronic kidney disease, stage 3a: Secondary | ICD-10-CM | POA: Diagnosis not present

## 2020-01-11 DIAGNOSIS — I5042 Chronic combined systolic (congestive) and diastolic (congestive) heart failure: Secondary | ICD-10-CM | POA: Diagnosis not present

## 2020-01-11 DIAGNOSIS — D631 Anemia in chronic kidney disease: Secondary | ICD-10-CM | POA: Diagnosis not present

## 2020-01-11 DIAGNOSIS — I130 Hypertensive heart and chronic kidney disease with heart failure and stage 1 through stage 4 chronic kidney disease, or unspecified chronic kidney disease: Secondary | ICD-10-CM | POA: Diagnosis not present

## 2020-01-11 DIAGNOSIS — M109 Gout, unspecified: Secondary | ICD-10-CM | POA: Diagnosis not present

## 2020-01-11 DIAGNOSIS — E1122 Type 2 diabetes mellitus with diabetic chronic kidney disease: Secondary | ICD-10-CM | POA: Diagnosis not present

## 2020-01-17 ENCOUNTER — Telehealth: Payer: Self-pay

## 2020-01-17 NOTE — Chronic Care Management (AMB) (Signed)
Chronic Care Management Pharmacy Assistant   Name: Anna Clay  MRN: [id number] DOB: [birth year]  Reason for Encounter: Medication Review  Patient Questions:  1.  Have you seen any other providers since your last visit? Yes: 08/15/2019- [facility] ED Admission, 08/15/2019 thru 8/17/2021[facility] Admission, 09/06/2019 and 09/08/2019- Retia Passe, MD ([place]), 09/13/2019-Julie Tacket, NP ([place]), 09/21/2019- [name], RN (CCM), 09/22/2019/ 09/30/2019/ 10/04/2019/ 10/10/2019/ 10/11/2019- Blossom Hoops, NP([facility]), 10/19/2019- [name], RN (CCM), 10/21/2019- Blossom Hoops, NP([facility]), 12/12/2019- [name], RN (CCM), 12/13/2019 /12/19/2019/ 12/21/2019/ 12/27/2019- Tillie Rung Humble,LSW (CCM).   2.  Any changes in your medicines or health? Yes- 09/05/2019- Apixaban 5 mg- 1 tablet twice daily added, Pantoprazole 40 mg- 1 tablet daily added, Atorvastatin 20 mg added. Torsemide 20 mg changed from twice daily to once daily and Toujeo instructions were changed from 62 units nightly to 15 units nightly . Amlodipine 10 mg, Pitavastatin 4 mg, Spironolactone 25 mg and Tizanidine 4 mg discontinued. 09/13/2019- Colchicine 0.6 mg added     PCP : Glendale Chard, MD  Allergies:   Allergies  Allergen Reactions  . Allspice [Pimenta] Hives and Shortness Of Breath  . Black Cohosh Hives and Shortness Of Breath  . Peach [Prunus Persica] Hives and Shortness Of Breath    Fresh peaches  . Ivp Dye [Iodinated Diagnostic Agents] Other (See Comments)    Reaction unknown  . Tetracyclines & Related Other (See Comments)    Reaction unknown    Medications: Outpatient Encounter Medications as of 01/17/2020  Medication Sig  . Accu-Chek FastClix Lancets MISC USE AS DIRECTED TO CHECK BLOOD SUGARS 2 TIMES PER DAY DX:E11.65  . acetaminophen (TYLENOL) 650 MG CR tablet Take 1,300 mg by mouth every 8 (eight) hours as needed for pain.  Marland Kitchen  allopurinol (ZYLOPRIM) 100 MG tablet Take 1 tablet (100 mg total) by mouth daily.  Marland Kitchen aspirin EC 81 MG tablet Take 81 mg by mouth daily.  . Blood Glucose Monitoring Suppl (ACCU-CHEK GUIDE) w/Device KIT USE AS DIRECTED TO CHECK BLOOD SUGARS 2 TIMES PER DAY DX:E11.65  . calcium carbonate (TUMS - DOSED IN MG ELEMENTAL CALCIUM) 500 MG chewable tablet Chew 2 tablets by mouth daily. 2 per day   . Cholecalciferol (VITAMIN D) 2000 UNITS CAPS Take 1 capsule by mouth daily.  . colchicine 0.6 MG tablet Take 1 tablet (0.6 mg total) by mouth daily. (Patient taking differently: Take 0.6 mg by mouth daily. PRN for gout flare)  . ELIQUIS 5 MG TABS tablet Take 1 tablet (5 mg total) by mouth 2 (two) times daily.  Marland Kitchen EPINEPHrine (EPIPEN 2-PAK) 0.3 mg/0.3 mL IJ SOAJ injection Inject 0.3 mLs (0.3 mg total) into the muscle as needed for anaphylaxis.  . ferrous sulfate 325 (65 FE) MG EC tablet Take 325 mg by mouth daily.  . fexofenadine (ALLEGRA) 180 MG tablet Take 180 mg by mouth daily.  . folic acid (FOLVITE) 1 MG tablet TAKE ONE TABLET BY MOUTH DAILY  . gabapentin (NEURONTIN) 300 MG capsule 100 mg 3 times per day  . insulin glargine, 1 Unit Dial, (TOUJEO SOLOSTAR) 300 UNIT/ML Solostar Pen Inject 58 Units into the skin at bedtime. (Patient taking differently: Inject 25 Units into the skin at bedtime. )  . insulin lispro (HUMALOG KWIKPEN) 200 UNIT/ML KwikPen 6 units with breakfast, 6 units with lunch, and 8 units with dinner (Patient taking differently: 10 units with breakfast, 10 units with lunch,  and 14 units with dinner)  . Insulin Pen Needle (NOVOFINE) 32G X 6 MM MISC USE 5 TO 8 NEEDLES PER DAY AS DIRECTED PER INSULIN PROTOCOL Dx code:e11.65  . magnesium oxide (MAG-OX) 400 MG tablet Take 400 mg by mouth daily.  . metoprolol succinate (TOPROL-XL) 25 MG 24 hr tablet Take 1 tablet (25 mg total) by mouth daily.  . Multiple Vitamin (MULTIVITAMIN WITH MINERALS) TABS tablet Take 1 tablet by mouth daily.  . nitroGLYCERIN  (NITROSTAT) 0.4 MG SL tablet Place 1 tablet (0.4 mg total) under the tongue every 5 (five) minutes as needed for chest pain.  Marland Kitchen oxyCODONE (OXY IR/ROXICODONE) 5 MG immediate release tablet Take by mouth. (Patient not taking: Reported on 08/08/2019)  . pantoprazole (PROTONIX) 40 MG tablet Take 1 tablet (40 mg total) by mouth daily.  . Pitavastatin Calcium 4 MG TABS Take 1 tablet (4 mg total) by mouth daily.  . Potassium Gluconate 550 MG TABS Take 1 tablet by mouth daily.  Marland Kitchen spironolactone (ALDACTONE) 25 MG tablet Take 1 tablet (25 mg total) by mouth daily.  Marland Kitchen torsemide (DEMADEX) 20 MG tablet Take 1 tablet (20 mg total) by mouth daily.  . vitamin E (VITAMIN E) 180 MG (400 UNITS) capsule Take 400 Units by mouth daily.   No facility-administered encounter medications on file as of 01/17/2020.    Current Diagnosis: Patient Active Problem List   Diagnosis Date Noted  . Sickle cell trait ([place]) 08/18/2018  . Iron deficiency anemia 10/24/2016  . Allergic rhinitis 06/07/2014  . OSA (obstructive sleep apnea) 10/05/2013  . Diabetes mellitus ([place]) 10/05/2013  . Anemia 10/05/2013  . Pulmonary hypertension ([place]) 10/04/2013  . Acute right-sided CHF (congestive heart failure) ([place]) 10/04/2013   Reviewed chart for medication changes ahead of medication coordination call.  OVs, Consults, or hospital visits-  08/15/2019- [facility] ED Admission, 08/15/2019 thru 8/17/2021[facility] Admission, 09/06/2019 and 09/08/2019- Retia Passe, MD ([place]), 09/13/2019-Julie Tacket, NP ([place]), 09/21/2019- [name], RN (CCM), 09/22/2019/ 09/30/2019/ 10/04/2019/ 10/10/2019/ 10/11/2019- Blossom Hoops, NP([facility]), 10/19/2019- [name], RN (CCM), 10/21/2019- Blossom Hoops, NP([facility]), 12/12/2019- [name], RN (CCM), 12/13/2019 /12/19/2019/ 12/21/2019/ 12/27/2019- Tillie Rung Humble,LSW (CCM) since last care coordination call/Pharmacist  visit.  Medication changes indicated: 09/05/2019- Apixaban 5 mg- 1 tablet twice daily added, Pantoprazole 40 mg- 1 tablet daily added, Atorvastatin 20 mg added. Torsemide 20 mg changed from twice daily to once daily and Toujeo instructions were changed from 62 units nightly to 15 units nightly . Amlodipine 10 mg, Pitavastatin 4 mg, Spironolactone 25 mg and Tizanidine 4 mg discontinued. 09/13/2019- Colchicine 0.6 mg added.  BP Readings from Last 3 Encounters:  06/21/19 (!) 146/88  04/06/19 138/78  04/06/19 138/78    Lab Results  Component Value Date   HGBA1C 6.9 (H) 04/06/2019     Patient obtains medications through Adherence Packaging  90 Days- Due to restarting adherence program this delivery will be Vials for 30 days,  restart 90 day adherence packaging in Jan 2022.    Last adherence delivery included: Patient was admitted to [facility] from 08/15/2019- 09/06/2019. Patient was then in skilled [place] from 09/06/2019- 10/20/2019. Patient discharge home 10/21/2019. Patient was still under [place] care until 11/01/2019.  Patient had enough medications from discharge from [place] and was being monitored by [facility].   Patient called and requested Eliquis 5 mg- 1 tablet  twice daily was sent on 12/09/2019 for a 30 day supply.  Patient declined medications last month- Adherence call not performed last month, no notification to pharmacist or pharmacy that patient was discharged home.  Patient is due for next adherence delivery on: 12/29/021. 01/17/2020- Called patient and reviewed medications and coordinated delivery.  This delivery to include: Eliquis 5 mg- 1 tablet twice daily Torsemide 20 mg- 1 tablet daily Toujeo- Inject 58 units sq at bedtime Pantoprazole 40 mg- 1 tablet daily Gabapentin 100 mg- 1 capsule by mouth three times daily Atorvastatin 20 mg - 1 tablet at bedtime Novofine [id number] pen needles- use 5-8  needles per day as directed per insulin protocol. Metoprolol 25 mg- 1 tablet daily before breakfast Humalog 200 u/ml kwikpen- Inject 10 units with breakfast, lunch and dinner. Colchicine 0.6 mg- 1 tablet daily  Coordinated acute future fill for Allopurinol 100 mg- 1 tablet daily to be delivered 01/30/2020.  Patient declined the following medications: Amlodipine 5 mg due to being discontinued Cyclobenzaprine 10 mg- due to no longer taking Nitroglycerin 0.4 mg due to prn use Livalo 4 mg due to being discontinued Accu-check meter and fastclix lancets due to no longer being covered by insurance Epinephrine 0.3 mg/0.3 ml due to prn use. Spironolactone 25 mg due to being discontinued. Allopurinol 100 mg- 1 tablet daily due to using PRN.  Patient needs refills for: Gabapentin 100 mg. Patient needs new prescriptions for :  Atorvastatin 20 mg, Colchicine 0.6 mg, Freestyle Libre monitor and sensors.  Confirmed delivery date of 01/18/2020, advised patient that pharmacy will contact them the morning of delivery.  Patient is checking her blood sugars, recent readings 145 fasting. Patient is not checking her  blood pressures due to not having blood pressure monitor.   Follow-Up:  Care Coordination with Outside Provider, Coordination of Enhanced Pharmacy Services and Pharmacist Review   01/17/2020- After speaking with patient, PCP office was notified about medication renewals and request for new medications, but due to patient not having a hospital follow up with PCP and has not had an office visit since March 2021, there were medications that are not able to be filled or renewed without hospitalization review from PCP. Patient will need a hospital follow up visit.  01/18/2020- Called patient and informed about visit and medications that needs to be reviewed with PCP, patient aware and states she is not sure if she will be able to make it into the office, still a little weak but wonders if she could  have a telephone visit with PCP. Patient aware I will relay this information to VOrlando Penner CPP to discuss with PCP. Patient is in need of Eliquis and Pantoprazole, she is completely out. Due to refills still available on patient prescriptions, coordinate acute fill form for Eliquis 5 mg and Pantoprazole 40 mg to be delivered on 01/18/2020.  01/19/2020- Received phone call from patient stating she is out of Torsemide and Metoprolol, due to refills still available on patient's prescriptions, coordinated acute fill form for Torsemide 20 mg and Metoprolol 25 mg to be delivered on 01/19/2020. Patient aware that I have had a discussion wit PCP office on Gabapentin, Atorvastatin, Colchicine and Freestyle Libre to see if refills and new prescriptions can be sent but visit is needed. Patient also aware that I will follow up with her insurance to see what diabetes supplies they will cover.  Called Medicare to inquire on which Diabetes supplies they will cover due to them not covering Accu-check or One touch  meter any longer. Inquired if they will cover Continuous Glucose monitors but due to system problem Medicare representative was not able to give me any information.   PCP office sent in [facility] monitor and sensors to [employer] but insurance is requiring a prior authorization.  Orlando Penner, CPP notified, patient has a follow up visit with Doreene Burke More, FNP (PCP) on 01/30/2020. Will determine which medications will be needed then.   Pattricia Boss, [place] Pharmacist Assistant [phone]

## 2020-01-19 ENCOUNTER — Other Ambulatory Visit: Payer: Self-pay

## 2020-01-19 DIAGNOSIS — I5042 Chronic combined systolic (congestive) and diastolic (congestive) heart failure: Secondary | ICD-10-CM | POA: Diagnosis not present

## 2020-01-19 DIAGNOSIS — N1831 Chronic kidney disease, stage 3a: Secondary | ICD-10-CM | POA: Diagnosis not present

## 2020-01-19 DIAGNOSIS — M109 Gout, unspecified: Secondary | ICD-10-CM | POA: Diagnosis not present

## 2020-01-19 DIAGNOSIS — D631 Anemia in chronic kidney disease: Secondary | ICD-10-CM | POA: Diagnosis not present

## 2020-01-19 DIAGNOSIS — E1122 Type 2 diabetes mellitus with diabetic chronic kidney disease: Secondary | ICD-10-CM | POA: Diagnosis not present

## 2020-01-19 DIAGNOSIS — I130 Hypertensive heart and chronic kidney disease with heart failure and stage 1 through stage 4 chronic kidney disease, or unspecified chronic kidney disease: Secondary | ICD-10-CM | POA: Diagnosis not present

## 2020-01-19 MED ORDER — FREESTYLE LIBRE 14 DAY READER DEVI: 3 refills | Status: DC

## 2020-01-19 MED ORDER — FREESTYLE LIBRE 14 DAY SENSOR MISC: 3 refills | Status: DC

## 2020-01-24 DIAGNOSIS — M109 Gout, unspecified: Secondary | ICD-10-CM | POA: Diagnosis not present

## 2020-01-24 DIAGNOSIS — I130 Hypertensive heart and chronic kidney disease with heart failure and stage 1 through stage 4 chronic kidney disease, or unspecified chronic kidney disease: Secondary | ICD-10-CM | POA: Diagnosis not present

## 2020-01-24 DIAGNOSIS — N1831 Chronic kidney disease, stage 3a: Secondary | ICD-10-CM | POA: Diagnosis not present

## 2020-01-24 DIAGNOSIS — D631 Anemia in chronic kidney disease: Secondary | ICD-10-CM | POA: Diagnosis not present

## 2020-01-24 DIAGNOSIS — E1122 Type 2 diabetes mellitus with diabetic chronic kidney disease: Secondary | ICD-10-CM | POA: Diagnosis not present

## 2020-01-24 DIAGNOSIS — I5042 Chronic combined systolic (congestive) and diastolic (congestive) heart failure: Secondary | ICD-10-CM | POA: Diagnosis not present

## 2020-01-26 ENCOUNTER — Ambulatory Visit: Payer: Self-pay

## 2020-01-26 ENCOUNTER — Other Ambulatory Visit: Payer: Self-pay

## 2020-01-26 ENCOUNTER — Telehealth: Payer: Medicare Other

## 2020-01-26 DIAGNOSIS — N183 Chronic kidney disease, stage 3 unspecified: Secondary | ICD-10-CM

## 2020-01-26 DIAGNOSIS — Z794 Long term (current) use of insulin: Secondary | ICD-10-CM

## 2020-01-26 DIAGNOSIS — G4733 Obstructive sleep apnea (adult) (pediatric): Secondary | ICD-10-CM

## 2020-01-26 DIAGNOSIS — I272 Pulmonary hypertension, unspecified: Secondary | ICD-10-CM

## 2020-01-30 ENCOUNTER — Telehealth (INDEPENDENT_AMBULATORY_CARE_PROVIDER_SITE_OTHER): Payer: Medicare Other | Admitting: Nurse Practitioner

## 2020-01-30 ENCOUNTER — Encounter: Payer: Self-pay | Admitting: Nurse Practitioner

## 2020-01-30 ENCOUNTER — Other Ambulatory Visit: Payer: Self-pay

## 2020-01-30 VITALS — BP 165/100 | HR 96 | Ht 60.6 in | Wt 235.0 lb

## 2020-01-30 DIAGNOSIS — M109 Gout, unspecified: Secondary | ICD-10-CM

## 2020-01-30 DIAGNOSIS — Z794 Long term (current) use of insulin: Secondary | ICD-10-CM

## 2020-01-30 DIAGNOSIS — I10 Essential (primary) hypertension: Secondary | ICD-10-CM | POA: Diagnosis not present

## 2020-01-30 DIAGNOSIS — G629 Polyneuropathy, unspecified: Secondary | ICD-10-CM

## 2020-01-30 DIAGNOSIS — E1140 Type 2 diabetes mellitus with diabetic neuropathy, unspecified: Secondary | ICD-10-CM | POA: Diagnosis not present

## 2020-01-30 DIAGNOSIS — M7989 Other specified soft tissue disorders: Secondary | ICD-10-CM

## 2020-01-30 MED ORDER — GABAPENTIN 300 MG PO CAPS: 300.0000 mg | ORAL_CAPSULE | Freq: Three times a day (TID) | ORAL | 1 refills | Status: DC

## 2020-01-30 NOTE — Progress Notes (Addendum)
Virtual Visit via    This visit type was conducted due to national recommendations for restrictions regarding the COVID-19 Pandemic (e.g. social distancing) in an effort to limit this patient's exposure and mitigate transmission in our community.  Due to her co-morbid illnesses, this patient is at least at moderate risk for complications without adequate follow up.  This format is felt to be most appropriate for this patient at this time.  All issues noted in this document were discussed and addressed.  A limited physical exam was performed with this format.    This visit type was conducted due to national recommendations for restrictions regarding the COVID-19 Pandemic (e.g. social distancing) in an effort to limit this patient's exposure and mitigate transmission in our community.  Patients identity confirmed using two different identifiers.  This format is felt to be most appropriate for this patient at this time.  All issues noted in this document were discussed and addressed.  No physical exam was performed (except for noted visual exam findings with Video Visits).    Date:  03/19/2020   ID:  Anna Clay, DOB 1950-12-28, MRN [id number]  Patient Location:  Home - spoke with Nicki Reaper  Provider location:   Office   Chief Complaint:  Diabetes follow up   History of Present Illness:    Anna Clay is a 70 y.o. female who presents via video conferencing for a telehealth visit today.    The patient does not have symptoms concerning for COVID-19 infection (fever, chills, cough, or new shortness of breath).   She has been having intermittent leg pain and was on gabapentin.  She is wanting it increased. When sitting down she has pain and will have to elevate feet or lie down on the bed to elevate.  Denies swelling.  Burning sharp and shooting pains, from ankle to her knees most of the time on her shins. Occasionally will have cold toes.  She has darkened toenails due to fungus   Will  have some swelling.   Her daughter in law is present in the room with her during the visit. She does not walk barefoot.  Blood sugars are under 200, this morning was 144. She is being followed by Dr. Baird Cancer for her diabetes.  She has had a history of DVT that traveled to her lungs. She is now on oxygen 2 l/m via [place].  She is having advanced home care to make a visit tomorrow.    She administers short acting insulin 3 times a day and Toujeo once a day.  She can have the freestyle libre to [employer]   She has not been able to get out of the house to go the doctors. [facility] for her specialist.   She would like her venous doppler done Friday after 3 pm so her daughter in law can take her     Past Medical History:  Diagnosis Date  . Chest pain   . Diabetes mellitus with kidney disease ([place])   . Diastolic heart failure ([place])   . Gout   . Hypercholesteremia   . Hypertension   . Obesity   . OSA (obstructive sleep apnea)   . Oxygen deficiency    Past Surgical History:  Procedure Laterality Date  . ABDOMINAL HYSTERECTOMY    . UVULOPALATOPHARYNGOPLASTY (UPPP)/TONSILLECTOMY/SEPTOPLASTY  01/25/2008   By Minna Merritts     Current Meds  Medication Sig  . Accu-Chek FastClix Lancets MISC USE AS DIRECTED TO CHECK BLOOD SUGARS  2 TIMES PER DAY DX:E11.65  . acetaminophen (TYLENOL) 650 MG CR tablet Take 1,300 mg by mouth every 8 (eight) hours as needed for pain.  Marland Kitchen allopurinol (ZYLOPRIM) 100 MG tablet Take 1 tablet (100 mg total) by mouth daily.  Marland Kitchen aspirin EC 81 MG tablet Take 81 mg by mouth daily.  . Blood Glucose Monitoring Suppl (ACCU-CHEK GUIDE) w/Device KIT USE AS DIRECTED TO CHECK BLOOD SUGARS 2 TIMES PER DAY DX:E11.65  . calcium carbonate (TUMS - DOSED IN MG ELEMENTAL CALCIUM) 500 MG chewable tablet Chew 2 tablets by mouth daily. 2 per day  . Cholecalciferol (VITAMIN D) 2000 UNITS CAPS Take 1 capsule by mouth daily.  Marland Kitchen EPINEPHrine (EPIPEN 2-PAK) 0.3 mg/0.3 mL IJ SOAJ injection  Inject 0.3 mLs (0.3 mg total) into the muscle as needed for anaphylaxis.  . ferrous sulfate 325 (65 FE) MG EC tablet Take 325 mg by mouth daily.  . fexofenadine (ALLEGRA) 180 MG tablet Take 180 mg by mouth daily.  . folic acid (FOLVITE) 1 MG tablet TAKE ONE TABLET BY MOUTH DAILY  . insulin glargine, 1 Unit Dial, (TOUJEO SOLOSTAR) 300 UNIT/ML Solostar Pen Inject 58 Units into the skin at bedtime. (Patient taking differently: Inject 25 Units into the skin at bedtime.)  . insulin lispro (HUMALOG KWIKPEN) 200 UNIT/ML KwikPen 6 units with breakfast, 6 units with lunch, and 8 units with dinner (Patient taking differently: 10 units with breakfast, 10 units with lunch, and 14 units with dinner)  . magnesium oxide (MAG-OX) 400 MG tablet Take 400 mg by mouth daily.  . metoprolol succinate (TOPROL-XL) 25 MG 24 hr tablet Take 1 tablet (25 mg total) by mouth daily.  . Misc Natural Products (ELDERBERRY ZINC/VIT C/IMMUNE MT) Use as directed 1 capsule in the mouth or throat daily at 12 noon.  . Multiple Vitamin (MULTIVITAMIN WITH MINERALS) TABS tablet Take 1 tablet by mouth daily.  . nitroGLYCERIN (NITROSTAT) 0.4 MG SL tablet Place 1 tablet (0.4 mg total) under the tongue every 5 (five) minutes as needed for chest pain.  . pantoprazole (PROTONIX) 40 MG tablet Take 1 tablet (40 mg total) by mouth daily.  . Pitavastatin Calcium 4 MG TABS Take 1 tablet (4 mg total) by mouth daily.  Marland Kitchen torsemide (DEMADEX) 20 MG tablet Take 1 tablet (20 mg total) by mouth daily.  . vitamin E 180 MG (400 UNITS) capsule Take 400 Units by mouth daily.  . [DISCONTINUED] colchicine 0.6 MG tablet Take 1 tablet (0.6 mg total) by mouth daily. (Patient taking differently: Take 0.6 mg by mouth daily. PRN for gout flare)  . [DISCONTINUED] ELIQUIS 5 MG TABS tablet Take 1 tablet (5 mg total) by mouth 2 (two) times daily.  . [DISCONTINUED] Insulin Pen Needle (NOVOFINE) 32G X 6 MM MISC USE 5 TO 8 NEEDLES PER DAY AS DIRECTED PER INSULIN PROTOCOL Dx  code:e11.65     Allergies:   Allspice [pimenta], Black cohosh, Peach [prunus persica], Heparin, Ivp dye [iodinated diagnostic agents], and Tetracyclines & related   Social History   Tobacco Use  . Smoking status: Former Smoker    Packs/day: 1.00    Years: 43.00    Pack years: 43.00    Types: Cigarettes    Quit date: 09/20/2013    Years since quitting: 6.4  . Smokeless tobacco: Never Used  Vaping Use  . Vaping Use: Never used  Substance Use Topics  . Alcohol use: Not Currently    Alcohol/week: 0.0 standard drinks  . Drug use: No  Family Hx: The patient's family history includes Cancer in her brother; Cancer - Lung in her sister; Diabetes in her brother and sister; Goiter in her mother; Heart attack in her father; Heart disease in her father and sister; Sickle cell trait in an other family member; Stroke in her father; Thyroid disease in her mother.  ROS:   Please see the history of present illness.    ROS  All other systems reviewed and are negative.   Labs/Other Tests and Data Reviewed:    Recent Labs: 01/31/2020: ALT 20; BUN 28; Creatinine 1.4; Potassium 4.7; Sodium 140   Recent Lipid Panel Lab Results  Component Value Date/Time   CHOL 180 01/31/2020 12:00 AM   CHOL 176 04/06/2019 05:07 PM   TRIG 135 01/31/2020 12:00 AM   HDL 45 01/31/2020 12:00 AM   HDL 45 04/06/2019 05:07 PM   CHOLHDL 3.9 04/06/2019 05:07 PM   LDLCALC 111 01/31/2020 12:00 AM   LDLCALC 110 (H) 04/06/2019 05:07 PM    Wt Readings from Last 3 Encounters:  01/30/20 235 lb (106.6 kg)  06/21/19 247 lb 3.2 oz (112.1 kg)  04/06/19 246 lb 9.6 oz (111.9 kg)     Exam:    Vital Signs:  BP (!) 165/100 (BP Location: Left Arm, Patient Position: Sitting, Cuff Size: Large)   Pulse 96   Ht 5' 0.6" (1.539 m)   Wt 235 lb (106.6 kg)   BMI 44.99 kg/m     Physical Exam Constitutional:      General: She is not in acute distress.    Appearance: She is obese.  Pulmonary:     Effort: Pulmonary effort is  normal. No respiratory distress.  Neurological:     General: No focal deficit present.     Mental Status: She is alert and oriented to person, place, and time.     Cranial Nerves: No cranial nerve deficit.  Psychiatric:        Mood and Affect: Mood normal.        Behavior: Behavior normal.        Thought Content: Thought content normal.        Judgment: Judgment normal.     ASSESSMENT & PLAN:    1. Type 2 diabetes mellitus with stage 3 chronic kidney disease, with long-term current use of insulin, unspecified whether stage 3a or 3b CKD ([place])  She has been having elevated blood sugars up to 200's  Will take her rapid acting insulin on sliding scale.   Continue with toujeo 25 units at bedtime.    Continue with gabapentin, I have increased the dose to 300 mg three times a day 2. Gout, unspecified cause, unspecified chronicity, unspecified site Chronic, she is stable  - VAS [place] LOWER EXTREMITY VENOUS (DVT); Future  4. Swelling of both lower extremities  She is having worsening pain to her legs with swelling   She has a previous history of DVT - VAS [place] LOWER EXTREMITY VENOUS (DVT); Future  5. Essential hypertension  Blood pressure is elevated today however this is with the cuff at her home  Encouraged to avoid high salt foods  May be related to her pain level to her legs  COVID-19 Education: The signs and symptoms of COVID-19 were discussed with the patient and how to seek care for testing (follow up with PCP or arrange E-visit).  The importance of social distancing was discussed today.  Patient Risk:   After full review of this patients clinical status, I feel  that they are at least moderate risk at this time.  Time:   Today, I have spent 19 minutes/ seconds with the patient with telehealth technology discussing above diagnoses.     Medication Adjustments/Labs and Tests Ordered: Current medicines are reviewed at length with the patient today.  Concerns regarding  medicines are outlined above.   Tests Ordered: Orders Placed This Encounter  Procedures  . VAS [place] LOWER EXTREMITY VENOUS (DVT)    Medication Changes: Meds ordered this encounter  Medications  . DISCONTD: gabapentin (NEURONTIN) 300 MG capsule    Sig: Take 1 capsule (300 mg total) by mouth 3 (three) times daily.    Dispense:  180 capsule    Refill:  1    Disposition:  Follow up prn  Signed, Minette Brine, FNP

## 2020-01-31 DIAGNOSIS — D631 Anemia in chronic kidney disease: Secondary | ICD-10-CM | POA: Diagnosis not present

## 2020-01-31 DIAGNOSIS — E1122 Type 2 diabetes mellitus with diabetic chronic kidney disease: Secondary | ICD-10-CM | POA: Diagnosis not present

## 2020-01-31 DIAGNOSIS — M109 Gout, unspecified: Secondary | ICD-10-CM | POA: Diagnosis not present

## 2020-01-31 DIAGNOSIS — N1831 Chronic kidney disease, stage 3a: Secondary | ICD-10-CM | POA: Diagnosis not present

## 2020-01-31 DIAGNOSIS — I130 Hypertensive heart and chronic kidney disease with heart failure and stage 1 through stage 4 chronic kidney disease, or unspecified chronic kidney disease: Secondary | ICD-10-CM | POA: Diagnosis not present

## 2020-01-31 DIAGNOSIS — I272 Pulmonary hypertension, unspecified: Secondary | ICD-10-CM | POA: Diagnosis not present

## 2020-01-31 DIAGNOSIS — I5042 Chronic combined systolic (congestive) and diastolic (congestive) heart failure: Secondary | ICD-10-CM | POA: Diagnosis not present

## 2020-01-31 LAB — LIPID PANEL
Cholesterol: 180 (ref 0–200)
HDL: 45 (ref 35–70)
LDL Cholesterol: 111
Triglycerides: 135 (ref 40–160)

## 2020-01-31 LAB — BASIC METABOLIC PANEL
BUN: 28 — AB (ref 4–21)
CO2: 25 — AB (ref 13–22)
Chloride: 99 (ref 99–108)
Creatinine: 1.4 — AB (ref 0.5–1.1)
Glucose: 147
Potassium: 4.7 (ref 3.4–5.3)
Sodium: 140 (ref 137–147)

## 2020-01-31 LAB — HEMOGLOBIN A1C: Hemoglobin A1C: 7

## 2020-01-31 LAB — HEPATIC FUNCTION PANEL
ALT: 20 (ref 7–35)
AST: 15 (ref 13–35)
Alkaline Phosphatase: 110 (ref 25–125)
Bilirubin, Total: 0.3

## 2020-01-31 LAB — COMPREHENSIVE METABOLIC PANEL
Albumin: 4 (ref 3.5–5.0)
Calcium: 10.5 (ref 8.7–10.7)
GFR calc Af Amer: 46
GFR calc non Af Amer: 40
Globulin: 3.9

## 2020-01-31 NOTE — Patient Instructions (Addendum)
Visit Information     Patient Stated   .  COMPLETED: "to be able to walk out of the house and do some Christmas shopping" (pt-stated)        [place] (see longitudinal plan of care for additional care plan information)  Current Barriers:  . Chronic Disease Management support and education needs related to DM II, Pulmonary Hypertension, OSA  Nurse Case Manager Clinical Goal(s):  Marland Kitchen Over the next 90 days, patient will work with the CCM team and PCP  to address needs related to disease education and support for improved Self Health management of Impaired Physical Mobility and Poor Gait   CCM RN CM Interventions:  01/26/20 call completed with patient  . Inter-disciplinary care team collaboration (see longitudinal plan of care) . Evaluation of current treatment plan related to Rehabilitation and patient's adherence to plan as established by provider . Assessed for status of patient's impaired physical mobility following several hospital admissions and inpatient rehabilitation  . Determined patient is progressing toward wellness and has been discharged from in home PT/OT . Determined patient is ambulating independently using her cane to help with balance and support as needed, she is able to stair climb and walk her dog  . Educated patient on fall prevention tips, stressed importance of using cane at all times when ambulating  . Discussed plans with patient for ongoing care management follow up and provided patient with direct contact information for care management team  Patient Self Care Activities:  . Self administers medications as prescribed . Attend all scheduled provider appointments . Call pharmacy for medication refills . Call provider office for new concerns or questions . Supportive family to assist with care needs . Unable to perform ADLs independently . Unable to perform IADLs independently  Please see past updates related to this goal by clicking on the "Past Updates"  button in the selected goal         The patient verbalized understanding of instructions, educational materials, and care plan provided today and declined offer to receive copy of patient instructions, educational materials, and care plan.   Telephone follow up appointment with care management team member scheduled for: 02/27/20  Lynne Logan, RN

## 2020-01-31 NOTE — Chronic Care Management (AMB) (Signed)
Chronic Care Management   Follow Up Note   01/26/2020 Name: Anna Clay MRN: [id number] DOB: Jan 31, 1950  Referred by: Glendale Chard, MD Reason for referral : Chronic Care Management (RN CM FU Call )   Anna Clay is a 70 y.o. year old female who is a primary care patient of Glendale Chard, MD. The CCM team was consulted for assistance with chronic disease management and care coordination needs.    Review of patient status, including review of consultants reports, relevant laboratory and other test results, and collaboration with appropriate care team members and the patient's provider was performed as part of comprehensive patient evaluation and provision of chronic care management services.    SDOH (Social Determinants of Health) assessments performed: No See Care Plan activities for detailed interventions related to [place])   Placed outbound CCM RN CM follow up call to patient for a care plan update.     Outpatient Encounter Medications as of 01/26/2020  Medication Sig  . Accu-Chek FastClix Lancets MISC USE AS DIRECTED TO CHECK BLOOD SUGARS 2 TIMES PER DAY DX:E11.65  . acetaminophen (TYLENOL) 650 MG CR tablet Take 1,300 mg by mouth every 8 (eight) hours as needed for pain.  Marland Kitchen allopurinol (ZYLOPRIM) 100 MG tablet Take 1 tablet (100 mg total) by mouth daily.  Marland Kitchen aspirin EC 81 MG tablet Take 81 mg by mouth daily.  . Blood Glucose Monitoring Suppl (ACCU-CHEK GUIDE) w/Device KIT USE AS DIRECTED TO CHECK BLOOD SUGARS 2 TIMES PER DAY DX:E11.65  . calcium carbonate (TUMS - DOSED IN MG ELEMENTAL CALCIUM) 500 MG chewable tablet Chew 2 tablets by mouth daily. 2 per day  . Cholecalciferol (VITAMIN D) 2000 UNITS CAPS Take 1 capsule by mouth daily.  Marland Kitchen ELIQUIS 5 MG TABS tablet Take 1 tablet (5 mg total) by mouth 2 (two) times daily.  Marland Kitchen EPINEPHrine (EPIPEN 2-PAK) 0.3 mg/0.3 mL IJ SOAJ injection Inject 0.3 mLs (0.3 mg total) into the muscle as needed for anaphylaxis.  . ferrous sulfate 325 (65 FE) MG  EC tablet Take 325 mg by mouth daily.  . fexofenadine (ALLEGRA) 180 MG tablet Take 180 mg by mouth daily.  . folic acid (FOLVITE) 1 MG tablet TAKE ONE TABLET BY MOUTH DAILY  . insulin glargine, 1 Unit Dial, (TOUJEO SOLOSTAR) 300 UNIT/ML Solostar Pen Inject 58 Units into the skin at bedtime. (Patient taking differently: Inject 25 Units into the skin at bedtime.)  . insulin lispro (HUMALOG KWIKPEN) 200 UNIT/ML KwikPen 6 units with breakfast, 6 units with lunch, and 8 units with dinner (Patient taking differently: 10 units with breakfast, 10 units with lunch, and 14 units with dinner)  . Insulin Pen Needle (NOVOFINE) 32G X 6 MM MISC USE 5 TO 8 NEEDLES PER DAY AS DIRECTED PER INSULIN PROTOCOL Dx code:e11.65  . magnesium oxide (MAG-OX) 400 MG tablet Take 400 mg by mouth daily.  . metoprolol succinate (TOPROL-XL) 25 MG 24 hr tablet Take 1 tablet (25 mg total) by mouth daily.  . Multiple Vitamin (MULTIVITAMIN WITH MINERALS) TABS tablet Take 1 tablet by mouth daily.  . nitroGLYCERIN (NITROSTAT) 0.4 MG SL tablet Place 1 tablet (0.4 mg total) under the tongue every 5 (five) minutes as needed for chest pain.  Marland Kitchen oxyCODONE (OXY IR/ROXICODONE) 5 MG immediate release tablet Take by mouth. (Patient not taking: No sig reported)  . pantoprazole (PROTONIX) 40 MG tablet Take 1 tablet (40 mg total) by mouth daily.  . Pitavastatin Calcium 4 MG TABS Take 1 tablet (4 mg  total) by mouth daily.  Marland Kitchen torsemide (DEMADEX) 20 MG tablet Take 1 tablet (20 mg total) by mouth daily.  . vitamin E 180 MG (400 UNITS) capsule Take 400 Units by mouth daily.  . [DISCONTINUED] Continuous Blood Gluc Receiver (FREESTYLE LIBRE 14 DAY READER) DEVI Use to check blood sugar 3 times a day. Dx code e11.65 (Patient not taking: Reported on 01/30/2020)  . [DISCONTINUED] Continuous Blood Gluc Sensor (FREESTYLE LIBRE 14 DAY SENSOR) MISC Use to check blood sugar 3 times a day. Dx code e11.65 (Patient not taking: Reported on 01/30/2020)  . [DISCONTINUED]  gabapentin (NEURONTIN) 300 MG capsule 100 mg 3 times per day (Patient not taking: Reported on 01/30/2020)  . [DISCONTINUED] Potassium Gluconate 550 MG TABS Take 1 tablet by mouth daily. (Patient not taking: Reported on 01/30/2020)  . [DISCONTINUED] spironolactone (ALDACTONE) 25 MG tablet Take 1 tablet (25 mg total) by mouth daily. (Patient not taking: Reported on 01/30/2020)   No facility-administered encounter medications on file as of 01/26/2020.     Objective:  Lab Results  Component Value Date   HGBA1C 6.9 (H) 04/06/2019   HGBA1C 7.0 (H) 12/29/2018   HGBA1C 8.0 (H) 08/18/2018   Lab Results  Component Value Date   MICROALBUR 150 04/06/2019   LDLCALC 110 (H) 04/06/2019   CREATININE 1.37 (H) 04/06/2019   BP Readings from Last 3 Encounters:  01/30/20 (!) 165/100  06/21/19 (!) 146/88  04/06/19 138/78    Goals Addressed      Patient Stated   .  COMPLETED: "to be able to walk out of the house and do some Christmas shopping" (pt-stated)        [place] (see longitudinal plan of care for additional care plan information)  Current Barriers:  . Chronic Disease Management support and education needs related to DM II, Pulmonary Hypertension, OSA  Nurse Case Manager Clinical Goal(s):  Marland Kitchen Over the next 90 days, patient will work with the CCM team and PCP  to address needs related to disease education and support for improved Self Health management of Impaired Physical Mobility and Poor Gait   CCM RN CM Interventions:  01/26/20 call completed with patient  . Inter-disciplinary care team collaboration (see longitudinal plan of care) . Evaluation of current treatment plan related to Rehabilitation and patient's adherence to plan as established by provider . Assessed for status of patient's impaired physical mobility following several hospital admissions and inpatient rehabilitation  . Determined patient is progressing toward wellness and has been discharged from in home  PT/OT . Determined patient is ambulating independently using her cane to help with balance and support as needed, she is able to stair climb and walk her dog  . Educated patient on fall prevention tips, stressed importance of using cane at all times when ambulating  . Discussed plans with patient for ongoing care management follow up and provided patient with direct contact information for care management team  Patient Self Care Activities:  . Self administers medications as prescribed . Attend all scheduled provider appointments . Call pharmacy for medication refills . Call provider office for new concerns or questions . Supportive family to assist with care needs . Unable to perform ADLs independently . Unable to perform IADLs independently  Please see past updates related to this goal by clicking on the "Past Updates" button in the selected goal       Plan:   Telephone follow up appointment with care management team member scheduled for: 02/27/20  Glenard Haring Meriah Shands,  RN, BSN, [place] Management Coordinator [place] [facility]  Direct Phone: [phone]

## 2020-02-01 ENCOUNTER — Other Ambulatory Visit: Payer: Self-pay | Admitting: Internal Medicine

## 2020-02-01 ENCOUNTER — Ambulatory Visit: Payer: Medicare Other

## 2020-02-01 DIAGNOSIS — I272 Pulmonary hypertension, unspecified: Secondary | ICD-10-CM

## 2020-02-01 DIAGNOSIS — N183 Chronic kidney disease, stage 3 unspecified: Secondary | ICD-10-CM

## 2020-02-01 DIAGNOSIS — Z794 Long term (current) use of insulin: Secondary | ICD-10-CM

## 2020-02-01 NOTE — Patient Instructions (Signed)
   Goals we discussed today:  Goals Addressed            This Visit's Progress   . COMPLETED: Collaborate with SW to address care coordination needs       Timeframe:  Short-Term Goal Priority:  High Start Date:    12/13/19                         Expected End Date: 02/19/19                 Next Expected Date of Contact: 02/01/19  Patient Goals/Self-Care Activities Over the next 30 days, patient will:   - Contact SW as needed prior to next scheduled call . Take Medication as prescribed . Contact provider office as needed . Follow up with [facility] as needed [employer] on [address] Completed . Consume diet specific meals delivered by [employer] to address food insecurity     . Work with SW to manage care coordination needs       Timeframe:  Long-Range Goal Priority:  [employer] Start Date:  1.12.22                           Expected End Date:  5.12.22                   Next planned outreach date: 2.15.22  Patient Goals/Self-Care Activities Over the next 30 days, patient will:   - Patient will self administer medications as prescribed Patient will attend all scheduled provider appointments Engage with embedded pharmacy team to address medication refill questions Contact SW as needed prior to next scheduled call

## 2020-02-01 NOTE — Chronic Care Management (AMB) (Signed)
Chronic Care Management    Social Work Note  02/01/2020 Name: Anna Clay MRN: [id number] DOB: 1950-09-04  Anna Clay is a 70 y.o. year old female who is a primary care patient of Anna Chard, MD. The CCM team was consulted to assist the patient with chronic disease management and/or care coordination needs related to: [employer] .   Engaged with patient by telephone for follow up visit in response to provider referral for social work chronic care management and care coordination services.   Consent to Services:  The patient was given information about Chronic Care Management services, agreed to services, and gave verbal consent prior to initiation of services.  Please see initial visit note for detailed documentation.   Patient agreed to services and consent obtained.   Assessment/Interventions: Review of patient past medical history, allergies, medications, and health status, including review of relevant consultants reports was performed today as part of a comprehensive evaluation and provision of chronic care management and care coordination services.     SDOH (Social Determinants of Health) assessments and interventions performed:  No  Advanced Directives Status: Not addressed in this encounter.  CCM Care Plan  Allergies  Allergen Reactions  . Allspice [Pimenta] Hives and Shortness Of Breath  . Black Cohosh Hives and Shortness Of Breath  . Peach [Prunus Persica] Hives and Shortness Of Breath    Fresh peaches  . Heparin Other (See Comments)    Blood clots  . Ivp Dye [Iodinated Diagnostic Agents] Other (See Comments)    Reaction unknown  . Tetracyclines & Related Other (See Comments)    Reaction unknown    Outpatient Encounter Medications as of 02/01/2020  Medication Sig  . Accu-Chek FastClix Lancets MISC USE AS DIRECTED TO CHECK BLOOD SUGARS 2 TIMES PER DAY DX:E11.65  . acetaminophen (TYLENOL) 650 MG CR tablet Take 1,300 mg by mouth every 8 (eight) hours as  needed for pain.  Marland Kitchen allopurinol (ZYLOPRIM) 100 MG tablet Take 1 tablet (100 mg total) by mouth daily.  Marland Kitchen aspirin EC 81 MG tablet Take 81 mg by mouth daily.  . Blood Glucose Monitoring Suppl (ACCU-CHEK GUIDE) w/Device KIT USE AS DIRECTED TO CHECK BLOOD SUGARS 2 TIMES PER DAY DX:E11.65  . calcium carbonate (TUMS - DOSED IN MG ELEMENTAL CALCIUM) 500 MG chewable tablet Chew 2 tablets by mouth daily. 2 per day  . Cholecalciferol (VITAMIN D) 2000 UNITS CAPS Take 1 capsule by mouth daily.  . colchicine 0.6 MG tablet Take 1 tablet (0.6 mg total) by mouth daily. (Patient taking differently: Take 0.6 mg by mouth daily. PRN for gout flare)  . ELIQUIS 5 MG TABS tablet Take 1 tablet (5 mg total) by mouth 2 (two) times daily.  Marland Kitchen EPINEPHrine (EPIPEN 2-PAK) 0.3 mg/0.3 mL IJ SOAJ injection Inject 0.3 mLs (0.3 mg total) into the muscle as needed for anaphylaxis.  . ferrous sulfate 325 (65 FE) MG EC tablet Take 325 mg by mouth daily.  . fexofenadine (ALLEGRA) 180 MG tablet Take 180 mg by mouth daily.  . folic acid (FOLVITE) 1 MG tablet TAKE ONE TABLET BY MOUTH DAILY  . gabapentin (NEURONTIN) 300 MG capsule Take 1 capsule (300 mg total) by mouth 3 (three) times daily.  . insulin glargine, 1 Unit Dial, (TOUJEO SOLOSTAR) 300 UNIT/ML Solostar Pen Inject 58 Units into the skin at bedtime. (Patient taking differently: Inject 25 Units into the skin at bedtime.)  . insulin lispro (HUMALOG KWIKPEN) 200 UNIT/ML KwikPen 6 units with breakfast, 6 units with  lunch, and 8 units with dinner (Patient taking differently: 10 units with breakfast, 10 units with lunch, and 14 units with dinner)  . magnesium oxide (MAG-OX) 400 MG tablet Take 400 mg by mouth daily.  . metoprolol succinate (TOPROL-XL) 25 MG 24 hr tablet Take 1 tablet (25 mg total) by mouth daily.  . Misc Natural Products (ELDERBERRY ZINC/VIT C/IMMUNE MT) Use as directed 1 capsule in the mouth or throat daily at 12 noon.  . Multiple Vitamin (MULTIVITAMIN WITH MINERALS) TABS  tablet Take 1 tablet by mouth daily.  . nitroGLYCERIN (NITROSTAT) 0.4 MG SL tablet Place 1 tablet (0.4 mg total) under the tongue every 5 (five) minutes as needed for chest pain.  Marland Kitchen NOVOFINE PEN NEEDLE 32G X 6 MM MISC USE 5 TO 8 NEEDLES PER DAY AS DIRECTED PER INSULIN PROTOCOL  . oxyCODONE (OXY IR/ROXICODONE) 5 MG immediate release tablet Take by mouth. (Patient not taking: No sig reported)  . pantoprazole (PROTONIX) 40 MG tablet Take 1 tablet (40 mg total) by mouth daily.  . Pitavastatin Calcium 4 MG TABS Take 1 tablet (4 mg total) by mouth daily.  Marland Kitchen torsemide (DEMADEX) 20 MG tablet Take 1 tablet (20 mg total) by mouth daily.  . vitamin E 180 MG (400 UNITS) capsule Take 400 Units by mouth daily.   No facility-administered encounter medications on file as of 02/01/2020.    Patient Active Problem List   Diagnosis Date Noted  . Sickle cell trait ([place]) 08/18/2018  . Iron deficiency anemia 10/24/2016  . Allergic rhinitis 06/07/2014  . OSA (obstructive sleep apnea) 10/05/2013  . Diabetes mellitus ([place]) 10/05/2013  . Anemia 10/05/2013  . Pulmonary hypertension ([place]) 10/04/2013  . Acute right-sided CHF (congestive heart failure) ([place]) 10/04/2013    Conditions to be addressed/monitored: DMII and Pulmonary Hypertension; Limited social support and Limited access to [place] : Social Work  Updates made by Daneen Schick since 02/01/2020 12:00 AM    Problem: Care Coordination     Goal: Food Insecurity Completed 02/01/2020  Start Date: 12/13/2019  Priority: High  Note:   Current Barriers:  . Limited social support . Limited access to food . Social Isolation . Inability to perform IADL's independently . Chronic conditions including DM II with Stage III CKD and Pulmonary HTN which put patient at increased risk for hospitalization  Social Work Clinical Goal(s):  Marland Kitchen Over the next 30 days, patient will work with SW to address concerns related to meal preparation  CCM SW  Interventions: . Successful outbound call placed to the patient to confirm receipt of meals . Goal met Patient Goals/Self-Care Activities Over the next 30 days, patient will:   - Contact SW as needed prior to next scheduled call . Take Medication as prescribed . Contact provider office as needed . [facility] with [facility] as needed [employer] on [address] Completed . Consume diet specific meals delivered by [employer] to address food insecurity   Follow up Plan: SW will follow up with the patient over the next week    Long-Range Goal: Collaborate with RN Care Manager to perform appropriate assessments to assist with care coordination needs   Start Date: 02/01/2020  Expected End Date: 05/31/2020  This Visit's Progress: On track  Priority: Medium  Note:   Current Barriers:  . Limited access to food . ADL IADL limitations . Chronic conditions including DM II and Pulmonary Hypertension which place patient at increased risk of hospitalization  Social Work Clinical  Goal(s):  Marland Kitchen Over the next 120 days, patient will work with SW to address concerns related to care coordination  Interventions: . 1:1 collaboration with Anna Chard, MD regarding development and update of comprehensive plan of care as evidenced by provider attestation and co-signature . Inter-disciplinary care team collaboration (see longitudinal plan of care) . Successful outbound call placed to assist with care coordination needs . Discussed the patient has yet to receive information on refills for Gabapentin . Performed chart review to note the patients primary provider authorized a refill on 1.10.22 . Determined the patient is actively enrolled with [facility] for medication compliance but is unclear when to expect delivery of Gabapentin . Advised the patient SW would collaborate with embedded pharmacy team to request follow up . Collaboration with Pattricia Boss requesting patient  follow up regarding medication question  Patient Goals/Self-Care Activities Over the next 30 days, patient will:   - Patient will self administer medications as prescribed Patient will attend all scheduled provider appointments Engage with embedded pharmacy team to address medication refill questions Contact SW as needed prior to next scheduled call  Follow up Plan: SW will follow up with patient by phone over the next month       Follow Up Plan: SW will follow up with patient by phone over the next month      Daneen Schick, BSW, CDP Social Worker, Certified Dementia Practitioner [place] [place] [facility] [phone]  Total time spent performing care coordination and/or care management activities with the patient by phone or face to face = 8 minutes.

## 2020-02-02 ENCOUNTER — Other Ambulatory Visit: Payer: Self-pay

## 2020-02-02 DIAGNOSIS — G629 Polyneuropathy, unspecified: Secondary | ICD-10-CM

## 2020-02-02 MED ORDER — GABAPENTIN 300 MG PO CAPS: 300.0000 mg | ORAL_CAPSULE | Freq: Three times a day (TID) | ORAL | 1 refills | Status: AC

## 2020-02-03 ENCOUNTER — Encounter: Payer: Self-pay | Admitting: Internal Medicine

## 2020-02-03 DIAGNOSIS — M5442 Lumbago with sciatica, left side: Secondary | ICD-10-CM | POA: Diagnosis not present

## 2020-02-03 DIAGNOSIS — N1831 Chronic kidney disease, stage 3a: Secondary | ICD-10-CM | POA: Diagnosis not present

## 2020-02-03 DIAGNOSIS — I130 Hypertensive heart and chronic kidney disease with heart failure and stage 1 through stage 4 chronic kidney disease, or unspecified chronic kidney disease: Secondary | ICD-10-CM | POA: Diagnosis not present

## 2020-02-03 DIAGNOSIS — E1142 Type 2 diabetes mellitus with diabetic polyneuropathy: Secondary | ICD-10-CM | POA: Diagnosis not present

## 2020-02-03 DIAGNOSIS — Z794 Long term (current) use of insulin: Secondary | ICD-10-CM | POA: Diagnosis not present

## 2020-02-03 DIAGNOSIS — E875 Hyperkalemia: Secondary | ICD-10-CM | POA: Diagnosis not present

## 2020-02-03 DIAGNOSIS — R339 Retention of urine, unspecified: Secondary | ICD-10-CM | POA: Diagnosis not present

## 2020-02-03 DIAGNOSIS — M5136 Other intervertebral disc degeneration, lumbar region: Secondary | ICD-10-CM | POA: Diagnosis not present

## 2020-02-03 DIAGNOSIS — M109 Gout, unspecified: Secondary | ICD-10-CM | POA: Diagnosis not present

## 2020-02-03 DIAGNOSIS — I2729 Other secondary pulmonary hypertension: Secondary | ICD-10-CM | POA: Diagnosis not present

## 2020-02-03 DIAGNOSIS — M5441 Lumbago with sciatica, right side: Secondary | ICD-10-CM | POA: Diagnosis not present

## 2020-02-03 DIAGNOSIS — E1122 Type 2 diabetes mellitus with diabetic chronic kidney disease: Secondary | ICD-10-CM | POA: Diagnosis not present

## 2020-02-03 DIAGNOSIS — E1165 Type 2 diabetes mellitus with hyperglycemia: Secondary | ICD-10-CM | POA: Diagnosis not present

## 2020-02-03 DIAGNOSIS — Z7984 Long term (current) use of oral hypoglycemic drugs: Secondary | ICD-10-CM | POA: Diagnosis not present

## 2020-02-03 DIAGNOSIS — M47816 Spondylosis without myelopathy or radiculopathy, lumbar region: Secondary | ICD-10-CM | POA: Diagnosis not present

## 2020-02-03 DIAGNOSIS — G4733 Obstructive sleep apnea (adult) (pediatric): Secondary | ICD-10-CM | POA: Diagnosis not present

## 2020-02-03 DIAGNOSIS — Z86718 Personal history of other venous thrombosis and embolism: Secondary | ICD-10-CM | POA: Diagnosis not present

## 2020-02-03 DIAGNOSIS — Z9181 History of falling: Secondary | ICD-10-CM | POA: Diagnosis not present

## 2020-02-03 DIAGNOSIS — Z7982 Long term (current) use of aspirin: Secondary | ICD-10-CM | POA: Diagnosis not present

## 2020-02-03 DIAGNOSIS — D631 Anemia in chronic kidney disease: Secondary | ICD-10-CM | POA: Diagnosis not present

## 2020-02-03 DIAGNOSIS — E782 Mixed hyperlipidemia: Secondary | ICD-10-CM | POA: Diagnosis not present

## 2020-02-03 DIAGNOSIS — Z6841 Body Mass Index (BMI) 40.0 and over, adult: Secondary | ICD-10-CM | POA: Diagnosis not present

## 2020-02-03 DIAGNOSIS — Z9981 Dependence on supplemental oxygen: Secondary | ICD-10-CM | POA: Diagnosis not present

## 2020-02-03 DIAGNOSIS — I5042 Chronic combined systolic (congestive) and diastolic (congestive) heart failure: Secondary | ICD-10-CM | POA: Diagnosis not present

## 2020-02-07 DIAGNOSIS — M109 Gout, unspecified: Secondary | ICD-10-CM | POA: Diagnosis not present

## 2020-02-07 DIAGNOSIS — D631 Anemia in chronic kidney disease: Secondary | ICD-10-CM | POA: Diagnosis not present

## 2020-02-07 DIAGNOSIS — N1831 Chronic kidney disease, stage 3a: Secondary | ICD-10-CM | POA: Diagnosis not present

## 2020-02-07 DIAGNOSIS — I130 Hypertensive heart and chronic kidney disease with heart failure and stage 1 through stage 4 chronic kidney disease, or unspecified chronic kidney disease: Secondary | ICD-10-CM | POA: Diagnosis not present

## 2020-02-07 DIAGNOSIS — E1122 Type 2 diabetes mellitus with diabetic chronic kidney disease: Secondary | ICD-10-CM | POA: Diagnosis not present

## 2020-02-07 DIAGNOSIS — I5042 Chronic combined systolic (congestive) and diastolic (congestive) heart failure: Secondary | ICD-10-CM | POA: Diagnosis not present

## 2020-02-15 ENCOUNTER — Telehealth: Payer: Self-pay

## 2020-02-15 NOTE — Chronic Care Management (AMB) (Signed)
Chronic Care Management Pharmacy Assistant   Name: Anna Clay  MRN: [id number] DOB: 1950-08-25  Reason for Encounter: Hypertension and Diabetes Adherence Call  Patient Questions:  1.  Have you seen any other providers since your last visit? Yes, 02/01/20-Humble, Kendra (CCM). 01/30/20-Moore, Doreene Burke, [place] (VV). 01/26/20-Little, Claudette Stapler, RN (CCM).    2.  Any changes in your medicines or health? Yes, 01/30/20- Gabapentin 300 MG (increased). Potassium Gluconate 550 MG (patient not taking). Spironolactone 25 MG (patient not taking).    PCP : Glendale Chard, MD  Allergies:   Allergies  Allergen Reactions   Allspice [Pimenta] Hives and Shortness Of Breath   Black Cohosh Hives and Shortness Of Breath   Peach [Prunus Persica] Hives and Shortness Of Breath    Fresh peaches   Heparin Other (See Comments)    Blood clots   Ivp Dye [Iodinated Diagnostic Agents] Other (See Comments)    Reaction unknown   Tetracyclines & Related Other (See Comments)    Reaction unknown    Medications: Outpatient Encounter Medications as of 02/15/2020  Medication Sig   Accu-Chek FastClix Lancets MISC USE AS DIRECTED TO CHECK BLOOD SUGARS 2 TIMES PER DAY DX:E11.65   acetaminophen (TYLENOL) 650 MG CR tablet Take 1,300 mg by mouth every 8 (eight) hours as needed for pain.   allopurinol (ZYLOPRIM) 100 MG tablet Take 1 tablet (100 mg total) by mouth daily.   aspirin EC 81 MG tablet Take 81 mg by mouth daily.   Blood Glucose Monitoring Suppl (ACCU-CHEK GUIDE) w/Device KIT USE AS DIRECTED TO CHECK BLOOD SUGARS 2 TIMES PER DAY DX:E11.65   calcium carbonate (TUMS - DOSED IN MG ELEMENTAL CALCIUM) 500 MG chewable tablet Chew 2 tablets by mouth daily. 2 per day   Cholecalciferol (VITAMIN D) 2000 UNITS CAPS Take 1 capsule by mouth daily.   colchicine 0.6 MG tablet Take 1 tablet (0.6 mg total) by mouth daily. (Patient taking differently: Take 0.6 mg by mouth daily. PRN for gout flare)   ELIQUIS 5 MG TABS  tablet Take 1 tablet (5 mg total) by mouth 2 (two) times daily.   EPINEPHrine (EPIPEN 2-PAK) 0.3 mg/0.3 mL IJ SOAJ injection Inject 0.3 mLs (0.3 mg total) into the muscle as needed for anaphylaxis.   ferrous sulfate 325 (65 FE) MG EC tablet Take 325 mg by mouth daily.   fexofenadine (ALLEGRA) 180 MG tablet Take 180 mg by mouth daily.   folic acid (FOLVITE) 1 MG tablet TAKE ONE TABLET BY MOUTH DAILY   gabapentin (NEURONTIN) 300 MG capsule Take 1 capsule (300 mg total) by mouth 3 (three) times daily.   insulin glargine, 1 Unit Dial, (TOUJEO SOLOSTAR) 300 UNIT/ML Solostar Pen Inject 58 Units into the skin at bedtime. (Patient taking differently: Inject 25 Units into the skin at bedtime.)   insulin lispro (HUMALOG KWIKPEN) 200 UNIT/ML KwikPen 6 units with breakfast, 6 units with lunch, and 8 units with dinner (Patient taking differently: 10 units with breakfast, 10 units with lunch, and 14 units with dinner)   magnesium oxide (MAG-OX) 400 MG tablet Take 400 mg by mouth daily.   metoprolol succinate (TOPROL-XL) 25 MG 24 hr tablet Take 1 tablet (25 mg total) by mouth daily.   Misc Natural Products (ELDERBERRY ZINC/VIT C/IMMUNE MT) Use as directed 1 capsule in the mouth or throat daily at 12 noon.   Multiple Vitamin (MULTIVITAMIN WITH MINERALS) TABS tablet Take 1 tablet by mouth daily.   nitroGLYCERIN (NITROSTAT) 0.4 MG SL tablet Place  1 tablet (0.4 mg total) under the tongue every 5 (five) minutes as needed for chest pain.   NOVOFINE PEN NEEDLE 32G X 6 MM MISC USE 5 TO 8 NEEDLES PER DAY AS DIRECTED PER INSULIN PROTOCOL   oxyCODONE (OXY IR/ROXICODONE) 5 MG immediate release tablet Take by mouth. (Patient not taking: No sig reported)   pantoprazole (PROTONIX) 40 MG tablet Take 1 tablet (40 mg total) by mouth daily.   Pitavastatin Calcium 4 MG TABS Take 1 tablet (4 mg total) by mouth daily.   torsemide (DEMADEX) 20 MG tablet Take 1 tablet (20 mg total) by mouth daily.   vitamin E 180 MG  (400 UNITS) capsule Take 400 Units by mouth daily.   No facility-administered encounter medications on file as of 02/15/2020.    Current Diagnosis: Patient Active Problem List   Diagnosis Date Noted   Sickle cell trait ([place]) 08/18/2018   Iron deficiency anemia 10/24/2016   Allergic rhinitis 06/07/2014   OSA (obstructive sleep apnea) 10/05/2013   Diabetes mellitus ([place]) 10/05/2013   Anemia 10/05/2013   Pulmonary hypertension ([place]) 10/04/2013   Acute right-sided CHF (congestive heart failure) ([place]) 10/04/2013   Recent Relevant Labs: Lab Results  Component Value Date/Time   HGBA1C 7.0 01/31/2020 12:00 AM   HGBA1C 6.9 (H) 04/06/2019 05:07 PM   HGBA1C 7.0 (H) 12/29/2018 04:49 PM   MICROALBUR 150 04/06/2019 04:18 PM   MICROALBUR 150 02/17/2018 04:35 PM    Kidney Function Lab Results  Component Value Date/Time   CREATININE 1.4 (A) 01/31/2020 12:00 AM   CREATININE 1.37 (H) 04/06/2019 05:07 PM   CREATININE 1.14 (H) 12/29/2018 04:49 PM   CREATININE 1.4 (H) 12/05/2016 08:13 AM   CREATININE 1.4 (H) 10/24/2016 08:30 AM   GFRNONAA 40 01/31/2020 12:00 AM   GFRAA 46 01/31/2020 12:00 AM     Current antihyperglycemic regimen:  o TOUJEO SOLOSTAR 300 Unit/ML- Inject 58 Units into the skin at bedtime. o Humalog Kwikpen 200 Unit/ML-6 units with breakfast, 6 units with lunch, and 8 units with dinner. Patient taking differently: 6 units with breakfast, 8 units with lunch, and 10 units with dinner. (Reported on 02/16/20)   What recent interventions/DTPs have been made to improve glycemic control:  o Recent interventions was to check blood sugar 3-4 times daily, document, and provide at future appointments. o Contact provider with any episodes of hypoglycemia   Have there been any recent hospitalizations or ED visits since last visit with CPP? No    Patient denies hypoglycemic symptoms, including Pale, Sweaty, Shaky, Hungry, Nervous/irritable and Vision changes    Patient denies  hyperglycemic symptoms, including blurry vision, excessive thirst, fatigue, polyuria and weakness    How often are you checking your blood sugar? twice daily, in the morning before eating or drinking and at bedtime    What are your blood sugars ranging?  o Fasting: 117 am (02/16/20) o Before meals: n/a o After meals: n/a o Bedtime: 131 pm (02/15/20)   During the week, how often does your blood glucose drop below 70? Never    Are you checking your feet daily/regularly? Patient states she checks her feet daily with random pain in feet and legs due to neuropathy.   Adherence Review: Is the patient currently on a STATIN medication? Yes Is the patient currently on ACE/ARB medication? No Does the patient have >5 day gap between last estimated fill dates? No   Reviewed chart prior to disease state call. Spoke with patient regarding BP  Recent Office Vitals:  BP Readings from Last 3 Encounters:  01/30/20 (!) 165/100  06/21/19 (!) 146/88  04/06/19 138/78   Pulse Readings from Last 3 Encounters:  01/30/20 96  06/21/19 94  04/06/19 98    Wt Readings from Last 3 Encounters:  01/30/20 235 lb (106.6 kg)  06/21/19 247 lb 3.2 oz (112.1 kg)  04/06/19 246 lb 9.6 oz (111.9 kg)     Kidney Function Lab Results  Component Value Date/Time   CREATININE 1.4 (A) 01/31/2020 12:00 AM   CREATININE 1.37 (H) 04/06/2019 05:07 PM   CREATININE 1.14 (H) 12/29/2018 04:49 PM   CREATININE 1.4 (H) 12/05/2016 08:13 AM   CREATININE 1.4 (H) 10/24/2016 08:30 AM   GFRNONAA 40 01/31/2020 12:00 AM   GFRAA 46 01/31/2020 12:00 AM    BMP Latest Ref Rng & Units 01/31/2020 04/06/2019 12/29/2018  Glucose 65 - 99 mg/dL - 70 90  BUN 4 - 21 28(A) 27 26  Creatinine 0.5 - 1.1 1.4(A) 1.37(H) 1.14(H)  BUN/Creat Ratio 12 - 28 - 20 23  Sodium 137 - 147 140 139 138  Potassium 3.4 - 5.3 4.7 4.6 4.1  Chloride 99 - 108 99 99 98  CO2 13 - 22 25(A) 26 25  Calcium 8.7 - 10.7 10.5 10.0 10.1     Current antihypertensive  regimen:   Pitavastatin Calcium 4 MG TABS  torsemide (DEMADEX) 20 MG tablet  metoprolol succinate (TOPROL-XL) 25 MG 24 hr tablet  ELIQUIS 5 MG TABS tablet   How often are you checking your Blood Pressure? 2 to 3 times a week.    Current home BP readings: 128/70 am (02/16/20).   What recent interventions/DTPs have been made by any provider to improve Blood Pressure control since last CPP Visit:  o Recent interventions was to check BP once, document, and provide at future appointments.  o Ensure daily salt intake < 2300 mg/day.   Any recent hospitalizations or ED visits since last visit with CPP? No    What diet changes have been made to improve Blood Pressure Control?  o Patient reports for breakfast she will eat a bagel and cup of coffee (sometimes bacon when she is craving for it), for lunch a bowl of fruit or leftovers from dinner. For dinner last she had Rotisserie Chicken with green beans and kraft mac and cheese. Patient states she has cut back from eating rice.   What exercise is being done to improve your Blood Pressure Control?  o Patient reports she is unable to stand for long periods but she has a nurse from the occupational & physical therapy who comes once a week where they do home exercises that consist of stretching, marching in place and squats.  Adherence Review: Is the patient currently on ACE/ARB medication? No Does the patient have >5 day gap between last estimated fill dates? No    Goals Addressed            This Visit's Progress    Pharmacy Care Plan   Not on track    CARE PLAN ENTRY  Current Barriers:   Chronic Disease Management support, education, and care coordination needs related to Hypertension, Hyperlipidemia, and Diabetes   Hypertension  Pharmacist Clinical Goal(s): o Over the next 90 days, patient will work with PharmD and providers to achieve BP goal <130/80  Current regimen:  o Amlodipine 105m daily  Interventions: o Will  discuss hypertensive regimen with PCP following nurse BP check on 6/1  Patient self care activities - Over the  next 90 days, patient will: o Check BP once, document, and provide at future appointments o Ensure daily salt intake < 2300 mg/day  Hyperlipidemia  Pharmacist Clinical Goal(s): o Over the next 90 days, patient will work with PharmD and providers to achieve LDL goal < 70  Current regimen:  o Pitavastatin 28m daily  Interventions: o Contacted PCP to send in refills, patient is out of medication  Patient self care activities - Over the next 90 days, patient will: o Resume taking medication daily  Diabetes  Pharmacist Clinical Goal(s): o Over the next 90 days, patient will work with PharmD and providers to maintain A1c goal <7%  Current regimen:  o Toujeo 58 units at bedtime o Humalog Kwikpen 6 units at breakfast, 6 units at lunch, 8 units at dinner  Interventions: o Will investigate CGM coverage and options on insurance  Patient self care activities - Over the next 90 days, patient will: o Check blood sugar 3-4 times daily, document, and provide at future appointments o Contact provider with any episodes of hypoglycemia  Medication management  Pharmacist Clinical Goal(s): o Over the next 90 days, patient will work with PharmD and providers to achieve optimal medication adherence  [facility] Interventions o Comprehensive medication review performed. o Utilize [facility] for medication synchronization, packaging and delivery  Patient self care activities - Over the next 90 days, patient will: o Focus on medication adherence by synchronization of medications and utilization of adherence packaging o Take medications as prescribed o Report any questions or concerns to PharmD and/or provider(s)  Initial goal documentation        Follow-Up:  Pharmacist Review-Patient is looking to have her 04/12/20 appointments rescheduled for  morning instead of afternoon if possible due to her son working the afternoons. Communicated with the patient I will send a message to my PTM so she can reach out to the scheduler [facility]. Patient voiced understanding.  VOrlando Penner CPP Notified.  CRaynelle Highland CGreenfieldPharmacist Assistant [phone]

## 2020-02-16 DIAGNOSIS — I5042 Chronic combined systolic (congestive) and diastolic (congestive) heart failure: Secondary | ICD-10-CM | POA: Diagnosis not present

## 2020-02-16 DIAGNOSIS — N1831 Chronic kidney disease, stage 3a: Secondary | ICD-10-CM | POA: Diagnosis not present

## 2020-02-16 DIAGNOSIS — I130 Hypertensive heart and chronic kidney disease with heart failure and stage 1 through stage 4 chronic kidney disease, or unspecified chronic kidney disease: Secondary | ICD-10-CM | POA: Diagnosis not present

## 2020-02-16 DIAGNOSIS — D631 Anemia in chronic kidney disease: Secondary | ICD-10-CM | POA: Diagnosis not present

## 2020-02-16 DIAGNOSIS — M109 Gout, unspecified: Secondary | ICD-10-CM | POA: Diagnosis not present

## 2020-02-16 DIAGNOSIS — E1122 Type 2 diabetes mellitus with diabetic chronic kidney disease: Secondary | ICD-10-CM | POA: Diagnosis not present

## 2020-02-23 ENCOUNTER — Telehealth: Payer: Self-pay

## 2020-02-23 NOTE — Chronic Care Management (AMB) (Addendum)
Chronic Care Management Pharmacy Assistant   Name: Anna Clay  MRN: [id number] DOB: [birth year]  Reason for Encounter: Medication Review  Patient Questions:  1.  Have you seen any other providers since your last visit? No  2.  Any changes in your medicines or health? No    PCP : Glendale Chard, MD  Allergies:   Allergies  Allergen Reactions   Allspice [Pimenta] Hives and Shortness Of Breath   Black Cohosh Hives and Shortness Of Breath   Peach [Prunus Persica] Hives and Shortness Of Breath    Fresh peaches   Heparin Other (See Comments)    Blood clots   Ivp Dye [Iodinated Diagnostic Agents] Other (See Comments)    Reaction unknown   Tetracyclines & Related Other (See Comments)    Reaction unknown    Medications: Outpatient Encounter Medications as of 02/23/2020  Medication Sig   Accu-Chek FastClix Lancets MISC USE AS DIRECTED TO CHECK BLOOD SUGARS 2 TIMES PER DAY DX:E11.65   acetaminophen (TYLENOL) 650 MG CR tablet Take 1,300 mg by mouth every 8 (eight) hours as needed for pain.   allopurinol (ZYLOPRIM) 100 MG tablet Take 1 tablet (100 mg total) by mouth daily.   aspirin EC 81 MG tablet Take 81 mg by mouth daily.   Blood Glucose Monitoring Suppl (ACCU-CHEK GUIDE) w/Device KIT USE AS DIRECTED TO CHECK BLOOD SUGARS 2 TIMES PER DAY DX:E11.65   calcium carbonate (TUMS - DOSED IN MG ELEMENTAL CALCIUM) 500 MG chewable tablet Chew 2 tablets by mouth daily. 2 per day   Cholecalciferol (VITAMIN D) 2000 UNITS CAPS Take 1 capsule by mouth daily.   colchicine 0.6 MG tablet Take 1 tablet (0.6 mg total) by mouth daily. (Patient taking differently: Take 0.6 mg by mouth daily. PRN for gout flare)   ELIQUIS 5 MG TABS tablet Take 1 tablet (5 mg total) by mouth 2 (two) times daily.   EPINEPHrine (EPIPEN 2-PAK) 0.3 mg/0.3 mL IJ SOAJ injection Inject 0.3 mLs (0.3 mg total) into the muscle as needed for anaphylaxis.   ferrous sulfate 325 (65 FE) MG EC tablet Take 325 mg by mouth daily.    fexofenadine (ALLEGRA) 180 MG tablet Take 180 mg by mouth daily.   folic acid (FOLVITE) 1 MG tablet TAKE ONE TABLET BY MOUTH DAILY   gabapentin (NEURONTIN) 300 MG capsule Take 1 capsule (300 mg total) by mouth 3 (three) times daily.   insulin glargine, 1 Unit Dial, (TOUJEO SOLOSTAR) 300 UNIT/ML Solostar Pen Inject 58 Units into the skin at bedtime. (Patient taking differently: Inject 25 Units into the skin at bedtime.)   insulin lispro (HUMALOG KWIKPEN) 200 UNIT/ML KwikPen 6 units with breakfast, 6 units with lunch, and 8 units with dinner (Patient taking differently: 10 units with breakfast, 10 units with lunch, and 14 units with dinner)   magnesium oxide (MAG-OX) 400 MG tablet Take 400 mg by mouth daily.   metoprolol succinate (TOPROL-XL) 25 MG 24 hr tablet Take 1 tablet (25 mg total) by mouth daily.   Misc Natural Products (ELDERBERRY ZINC/VIT C/IMMUNE MT) Use as directed 1 capsule in the mouth or throat daily at 12 noon.   Multiple Vitamin (MULTIVITAMIN WITH MINERALS) TABS tablet Take 1 tablet by mouth daily.   nitroGLYCERIN (NITROSTAT) 0.4 MG SL tablet Place 1 tablet (0.4 mg total) under the tongue every 5 (five) minutes as needed for chest pain.   NOVOFINE PEN NEEDLE 32G X 6 MM MISC USE 5 TO 8 NEEDLES PER DAY AS  DIRECTED PER INSULIN PROTOCOL   oxyCODONE (OXY IR/ROXICODONE) 5 MG immediate release tablet Take by mouth. (Patient not taking: No sig reported)   pantoprazole (PROTONIX) 40 MG tablet Take 1 tablet (40 mg total) by mouth daily.   Pitavastatin Calcium 4 MG TABS Take 1 tablet (4 mg total) by mouth daily.   torsemide (DEMADEX) 20 MG tablet Take 1 tablet (20 mg total) by mouth daily.   vitamin E 180 MG (400 UNITS) capsule Take 400 Units by mouth daily.   No facility-administered encounter medications on file as of 02/23/2020.    Current Diagnosis: Patient Active Problem List   Diagnosis Date Noted   Sickle cell trait ([place]) 08/18/2018   Iron deficiency anemia 10/24/2016   Allergic  rhinitis 06/07/2014   OSA (obstructive sleep apnea) 10/05/2013   Diabetes mellitus ([place]) 10/05/2013   Anemia 10/05/2013   Pulmonary hypertension ([place]) 10/04/2013   Acute right-sided CHF (congestive heart failure) ([place]) 10/04/2013   Reviewed chart for medication changes ahead of medication coordination call.  No OVs, Consults, or hospital visits since last care coordination call/Pharmacist visit.  No medication changes indicated..  BP Readings from Last 3 Encounters:  01/30/20 (!) 165/100  06/21/19 (!) 146/88  04/06/19 138/78    Lab Results  Component Value Date   HGBA1C 7.0 01/31/2020     Patient obtains medications through Adherence Packaging  90 Days   Last adherence delivery included:  Eliquis 5 mg- 1 tablet twice daily Torsemide 20 mg- 1 tablet daily Toujeo- Inject 58 units sq at bedtime Pantoprazole 40 mg- 1 tablet daily Gabapentin 100 mg- 1 capsule by mouth three times daily Atorvastatin 20 mg - 1 tablet at bedtime Novofine 32gx11m pen needles- use 5-8 needles per day as directed per insulin protocol. Metoprolol 25 mg- 1 tablet daily before breakfast Humalog 200 u/ml kwikpen- Inject 10 units with breakfast, lunch and dinner. Colchicine 0.6 mg- 1 tablet daily  Patient declined the following medications last month:  Amlodipine 5 mg due to being discontinued Cyclobenzaprine 10 mg- due to no longer taking Nitroglycerin 0.4 mg due to prn use Livalo 4 mg due to being discontinued Accu-check meter and fastclix lancets due to no longer being covered by insurance Epinephrine 0.3 mg/0.3 ml due to prn use. Spironolactone 25 mg due to being discontinued. Allopurinol 100 mg- 1 tablet daily due to using PRN.  Patient is due for next adherence delivery on: 02/27/2020. Called patient and reviewed medications and coordinated delivery.  This delivery to include: Allopurinol 100 mg- 1 tablet daily (Vial #30 to sync with packaging) Gabapentin 300 mg- 1 capsule three times daily  (#90 to sync with packaging) Colchicine 0.6 mg- 1 tablet daily for gout (prescription requested from PCP) Freestyle Kit sensor- Use to check blood sugars three times daily (if covered by insurance) Freestyle Reader- Use to check blood sugars three times daily (if covered by insurance) Accu-check Guide Glucometer- Use to check blood sugars three times daily Accu-check Guide Lancets Device- Use to check blood sugars three times daily Accu-check Guide test strips- Use to check blood sugars three times daily  No short supply needed.  Coordinated acute future fill for Humalog 200 u/ml kwikpen- Inject 10 units with breakfast, lunch and dinner, Pantoprazole 40 mg- 1 tablet daily and Eliquis 5 mg- 1 tablet twice daily to be delivered 03/22/2020 to sync with packages for 04/18/2020.  Patient declined the following medications: Eliquis 5 mg- 1 tablet twice daily Pantoprazole 40 mg- 1 tablet daily Humalog 200 u/ml kwikpen-  Inject 10 units with breakfast, lunch and dinner.   Due to receiving a 30 day supply on 02/21/2020.  Metoprolol 25 mg- 1 tablet daily before breakfast Torsemide 20 mg- 1 tablet daily   Due to receiving a 90 day supply on 01/19/2020  Toujeo- Inject 58 units sq at bedtime due to having 2 full boxes with 3 pens in each and another full pen on hand. Amlodipine 5 mg due to being discontinued Cyclobenzaprine 10 mg- due to no longer taking Nitroglycerin 0.4 mg due to prn use Livalo 4 mg due to being discontinued Epinephrine 0.3 mg/0.3 ml due to prn use. Spironolactone 25 mg due to being discontinued.  Patient needs refills for Accu-check guide lancets and test strips .  Confirmed delivery date of 02/27/2020, advised patient that pharmacy will contact them the morning of delivery.  Follow-Up:  Coordination of Enhanced Pharmacy Services, Medication Cost Review, Patient Assistance Coordination and Pharmacist Review  Patient is out of Gabapentin, which was recently increased, attempted  prior authorization for Freestyle Libre (Covermymeds cannot find patient in system), researched more in Covermymeds on which meter will be covered by insurance, Accu-check Guide should be covered, requested new prescriptions for supplies. Patient is checking blood sugars and blood pressures, last blood sugar reading fasting am 137 but usually around the 120's.Patient recent blood pressure readings 136/75 and 134/77. Patient also inquired if she should be taking a cholesterol medication. Patient aware I will forward this information to Orlando Penner, to discuss with PCP.  Orlando Penner, CPP notified.  Pattricia Boss, [place] Pharmacist Assistant [phone]  I have reviewed the care management and care coordination activities outlined in this encounter and I am certifying that I agree with the content of this note. Will inform patient to bring BP reading log to office visits when she is seen again.   2 minutes spent in review, coordination, and documentation.  Mayford Knife, [facility] 02/28/20 4:25 PM

## 2020-02-24 DIAGNOSIS — E1122 Type 2 diabetes mellitus with diabetic chronic kidney disease: Secondary | ICD-10-CM | POA: Diagnosis not present

## 2020-02-24 DIAGNOSIS — I130 Hypertensive heart and chronic kidney disease with heart failure and stage 1 through stage 4 chronic kidney disease, or unspecified chronic kidney disease: Secondary | ICD-10-CM | POA: Diagnosis not present

## 2020-02-24 DIAGNOSIS — I5042 Chronic combined systolic (congestive) and diastolic (congestive) heart failure: Secondary | ICD-10-CM | POA: Diagnosis not present

## 2020-02-24 DIAGNOSIS — M109 Gout, unspecified: Secondary | ICD-10-CM | POA: Diagnosis not present

## 2020-02-24 DIAGNOSIS — D631 Anemia in chronic kidney disease: Secondary | ICD-10-CM | POA: Diagnosis not present

## 2020-02-24 DIAGNOSIS — N1831 Chronic kidney disease, stage 3a: Secondary | ICD-10-CM | POA: Diagnosis not present

## 2020-02-27 ENCOUNTER — Telehealth: Payer: Self-pay

## 2020-02-27 ENCOUNTER — Ambulatory Visit (INDEPENDENT_AMBULATORY_CARE_PROVIDER_SITE_OTHER): Payer: Medicare Other

## 2020-02-27 ENCOUNTER — Other Ambulatory Visit: Payer: Self-pay

## 2020-02-27 ENCOUNTER — Telehealth: Payer: Medicare Other

## 2020-02-27 DIAGNOSIS — G4733 Obstructive sleep apnea (adult) (pediatric): Secondary | ICD-10-CM

## 2020-02-27 DIAGNOSIS — E1122 Type 2 diabetes mellitus with diabetic chronic kidney disease: Secondary | ICD-10-CM

## 2020-02-27 DIAGNOSIS — I272 Pulmonary hypertension, unspecified: Secondary | ICD-10-CM

## 2020-02-27 DIAGNOSIS — N183 Chronic kidney disease, stage 3 unspecified: Secondary | ICD-10-CM

## 2020-02-27 DIAGNOSIS — Z794 Long term (current) use of insulin: Secondary | ICD-10-CM | POA: Diagnosis not present

## 2020-02-27 MED ORDER — COLCHICINE 0.6 MG PO TABS: 0.6000 mg | ORAL_TABLET | Freq: Every day | ORAL | 2 refills | Status: AC

## 2020-02-27 MED ORDER — ACCU-CHEK GUIDE VI STRP: ORAL_STRIP | 2 refills | Status: AC

## 2020-02-27 MED ORDER — ELIQUIS 5 MG PO TABS: 5.0000 mg | ORAL_TABLET | Freq: Two times a day (BID) | ORAL | 0 refills | Status: DC

## 2020-02-27 MED ORDER — ACCU-CHEK SOFTCLIX LANCETS MISC: 2 refills | Status: AC

## 2020-02-27 NOTE — Telephone Encounter (Signed)
Rolan Lipa sent me a message saying Ms.Mapel is not sure if she is supposed to be taking a cholesterol medicine? She said they discontinued her livalo when she was in the hospital [facility]

## 2020-02-27 NOTE — Telephone Encounter (Signed)
Does she know why it was stopped? Is she still having back pain? If not, I would like for her to resume MWF dosing for now.

## 2020-02-28 ENCOUNTER — Other Ambulatory Visit: Payer: Self-pay

## 2020-02-28 ENCOUNTER — Telehealth: Payer: Self-pay

## 2020-02-28 DIAGNOSIS — M109 Gout, unspecified: Secondary | ICD-10-CM | POA: Diagnosis not present

## 2020-02-28 DIAGNOSIS — E1122 Type 2 diabetes mellitus with diabetic chronic kidney disease: Secondary | ICD-10-CM | POA: Diagnosis not present

## 2020-02-28 DIAGNOSIS — I5042 Chronic combined systolic (congestive) and diastolic (congestive) heart failure: Secondary | ICD-10-CM | POA: Diagnosis not present

## 2020-02-28 DIAGNOSIS — N1831 Chronic kidney disease, stage 3a: Secondary | ICD-10-CM | POA: Diagnosis not present

## 2020-02-28 DIAGNOSIS — I130 Hypertensive heart and chronic kidney disease with heart failure and stage 1 through stage 4 chronic kidney disease, or unspecified chronic kidney disease: Secondary | ICD-10-CM | POA: Diagnosis not present

## 2020-02-28 DIAGNOSIS — D631 Anemia in chronic kidney disease: Secondary | ICD-10-CM | POA: Diagnosis not present

## 2020-02-28 NOTE — Chronic Care Management (AMB) (Addendum)
Chronic Care Management Pharmacy Assistant   Name: Anna Clay  MRN: [id number] DOB: August 29, 1950  Reason for Encounter: Patient Assistance Coordination/Medication Review  PCP : Glendale Chard, MD   02/28/2020- Called patient regarding DM supplies, will work on getting free DM supplies with [employer]. Patient stated she has tried them before what she would not be covered. Patient aware that I will call to find out why and get more information.  02/29/2020- Called [facility], spoke with Sam, they did not see application on file for patient to look back at. New application started and faxed over.   03/07/2020- Called [facility] to follow up on application sent on 04/27/5460- spoke with Sonia Side, he stated to have patient call them to do an intake and compare information they have on file for her. Patient will be notified.   03/12/2020- Patient notified by Raynelle Highland, CPA to contact [facility] to verify information on file for her.  Allergies:   Allergies  Allergen Reactions   Allspice [Pimenta] Hives and Shortness Of Breath   Black Cohosh Hives and Shortness Of Breath   Peach [Prunus Persica] Hives and Shortness Of Breath    Fresh peaches   Heparin Other (See Comments)    Blood clots   Ivp Dye [Iodinated Diagnostic Agents] Other (See Comments)    Reaction unknown   Tetracyclines & Related Other (See Comments)    Reaction unknown    Medications: Outpatient Encounter Medications as of 02/28/2020  Medication Sig   Accu-Chek FastClix Lancets MISC USE AS DIRECTED TO CHECK BLOOD SUGARS 2 TIMES PER DAY DX:E11.65   Accu-Chek Softclix Lancets lancets Use to check blood sugars twice daily E11.69   acetaminophen (TYLENOL) 650 MG CR tablet Take 1,300 mg by mouth every 8 (eight) hours as needed for pain.   allopurinol (ZYLOPRIM) 100 MG tablet Take 1 tablet (100 mg total) by mouth daily.   aspirin EC 81 MG tablet Take 81 mg by mouth daily.   Blood Glucose Monitoring Suppl (ACCU-CHEK GUIDE)  w/Device KIT USE AS DIRECTED TO CHECK BLOOD SUGARS 2 TIMES PER DAY DX:E11.65   calcium carbonate (TUMS - DOSED IN MG ELEMENTAL CALCIUM) 500 MG chewable tablet Chew 2 tablets by mouth daily. 2 per day   Cholecalciferol (VITAMIN D) 2000 UNITS CAPS Take 1 capsule by mouth daily.   colchicine 0.6 MG tablet Take 1 tablet (0.6 mg total) by mouth daily.   ELIQUIS 5 MG TABS tablet Take 1 tablet (5 mg total) by mouth 2 (two) times daily.   EPINEPHrine (EPIPEN 2-PAK) 0.3 mg/0.3 mL IJ SOAJ injection Inject 0.3 mLs (0.3 mg total) into the muscle as needed for anaphylaxis.   ferrous sulfate 325 (65 FE) MG EC tablet Take 325 mg by mouth daily.   fexofenadine (ALLEGRA) 180 MG tablet Take 180 mg by mouth daily.   folic acid (FOLVITE) 1 MG tablet TAKE ONE TABLET BY MOUTH DAILY   gabapentin (NEURONTIN) 300 MG capsule Take 1 capsule (300 mg total) by mouth 3 (three) times daily.   glucose blood (ACCU-CHEK GUIDE) test strip Use to check blood sugars twice daily E11.69   insulin glargine, 1 Unit Dial, (TOUJEO SOLOSTAR) 300 UNIT/ML Solostar Pen Inject 58 Units into the skin at bedtime. (Patient taking differently: Inject 25 Units into the skin at bedtime.)   insulin lispro (HUMALOG KWIKPEN) 200 UNIT/ML KwikPen 6 units with breakfast, 6 units with lunch, and 8 units with dinner (Patient taking differently: 10 units with breakfast, 10 units with lunch,  and 14 units with dinner)   magnesium oxide (MAG-OX) 400 MG tablet Take 400 mg by mouth daily.   metoprolol succinate (TOPROL-XL) 25 MG 24 hr tablet Take 1 tablet (25 mg total) by mouth daily.   Misc Natural Products (ELDERBERRY ZINC/VIT C/IMMUNE MT) Use as directed 1 capsule in the mouth or throat daily at 12 noon.   Multiple Vitamin (MULTIVITAMIN WITH MINERALS) TABS tablet Take 1 tablet by mouth daily.   nitroGLYCERIN (NITROSTAT) 0.4 MG SL tablet Place 1 tablet (0.4 mg total) under the tongue every 5 (five) minutes as needed for chest pain.   NOVOFINE PEN NEEDLE 32G X 6  MM MISC USE 5 TO 8 NEEDLES PER DAY AS DIRECTED PER INSULIN PROTOCOL   oxyCODONE (OXY IR/ROXICODONE) 5 MG immediate release tablet Take by mouth. (Patient not taking: No sig reported)   pantoprazole (PROTONIX) 40 MG tablet Take 1 tablet (40 mg total) by mouth daily.   Pitavastatin Calcium 4 MG TABS Take 1 tablet (4 mg total) by mouth daily.   torsemide (DEMADEX) 20 MG tablet Take 1 tablet (20 mg total) by mouth daily.   vitamin E 180 MG (400 UNITS) capsule Take 400 Units by mouth daily.   No facility-administered encounter medications on file as of 02/28/2020.    Current Diagnosis: Patient Active Problem List   Diagnosis Date Noted   Sickle cell trait ([place]) 08/18/2018   Iron deficiency anemia 10/24/2016   Allergic rhinitis 06/07/2014   OSA (obstructive sleep apnea) 10/05/2013   Diabetes mellitus ([place]) 10/05/2013   Anemia 10/05/2013   Pulmonary hypertension ([place]) 10/04/2013   Acute right-sided CHF (congestive heart failure) ([place]) 10/04/2013    BP Readings from Last 3 Encounters:  01/30/20 (!) 165/100  06/21/19 (!) 146/88  04/06/19 138/78    Lab Results  Component Value Date   HGBA1C 7.0 01/31/2020     Patient obtains medications through Adherence Packaging  90 Days   Last adherence delivery included: Allopurinol 100 mg- 1 tablet daily (Vial #30 to sync with packaging) Gabapentin 300 mg- 1 capsule three times daily (#90 to sync with packaging) Colchicine 0.6 mg- 1 tablet daily for gout (prescription requested from PCP) Freestyle Kit sensor- Use to check blood sugars three times daily (if covered by insurance) Freestyle Reader- Use to check blood sugars three times daily (if covered by insurance) Accu-check Guide Glucometer- Use to check blood sugars three times daily Accu-check Guide Lancets Device- Use to check blood sugars three times daily Accu-check Guide test strips- Use to check blood sugars three times daily  Patient declined the following medications last  month: Eliquis 5 mg- 1 tablet twice daily Pantoprazole 40 mg- 1 tablet daily Humalog 200 u/ml kwikpen- Inject 10 units with breakfast, lunch and dinner.                         Due to receiving a 30 day supply on 02/21/2020.   Metoprolol 25 mg- 1 tablet daily before breakfast Torsemide 20 mg- 1 tablet daily                         Due to receiving a 90 day supply on 01/19/2020   Toujeo- Inject 58 units sq at bedtime due to having 2 full boxes with 3 pens in each and another full pen on hand. Amlodipine 5 mg due to being discontinued Cyclobenzaprine 10 mg- due to no longer taking Nitroglycerin 0.4 mg due to prn  use Livalo 4 mg due to being discontinued Epinephrine 0.3 mg/0.3 ml due to prn use. Spironolactone 25 mg due to being discontinued.   Patient is due for next adherence delivery on: 03/15/2020. Called patient and reviewed medications and coordinated delivery.  This delivery to include:  Colchicine 0.6 mg- 1 tablet daily   Patient declined the following medications: Allopurinol 100 mg- 1 tablet daily due to receiving a 51 day supply on 02/27/2020- Patient also takes PRN Gabapentin 300 mg- 1 capsule three times daily due to receiving a 51 day supply on 02/27/2020   Freestyle Kit sensor- Use to check blood sugars three times daily  Freestyle Reader- Use to check blood sugars three times daily  Accu-check Guide Glucometer- Use to check blood sugars three times daily Accu-check Guide Lancets Device- Use to check blood sugars three times daily Accu-check Guide test strips- Use to check blood sugars three times daily    Due to not being covered by insurance Eliquis 5 mg- 1 tablet twice daily Pantoprazole 40 mg- 1 tablet daily Humalog 200 u/ml kwikpen- Inject 10 units with breakfast, lunch and dinner. Due to receiving a 30-day supply on 02/21/2020.  Metoprolol 25 mg- 1 tablet daily before breakfast Torsemide 20 mg- 1 tablet daily Due to receiving a 90-day supply on  01/19/2020  Toujeo- Inject 58 units sq at bedtime due to having 2 full boxes with 3 pens in each. Amlodipine 5 mg due to being discontinued Cyclobenzaprine 10 mg- due to no longer taking Nitroglycerin 0.4 mg due to prn use Livalo 4 mg due to being discontinued Epinephrine 0.3 mg/0.3 ml due to prn use. Spironolactone 25 mg due to being discontinued.   Patient needs refills for None.  Confirmed delivery date of 03/16/2020, advised patient that pharmacy will contact them the morning of delivery.  Follow-Up:  Comptroller, Patient Assistance Coordination and Pharmacist Review  Coordinated future fill for: Eliquis 5 mg- 1 tablet twice daily Pantoprazole 40 mg- 1 tablet daily Humalog 200 u/ml kwikpen- Inject 10 units with breakfast, lunch and dinner.   To be delivered on 03/21/2020, enough to sync with packages on 04/18/2020  Patient reports she is having a gout flare-up in left ankle which is getting better, she is using Colchicine 0.6 mg daily and Allopurinol 100 mg daily for the past week, patient only uses PRN but with both treatments her gout is getting better.   Patient was able to contact [facility] supply, they still did not see her information on file. Resending form to [facility] requesting continue glucose monitoring system for patient.  Orlando Penner, CPP notified.  Pattricia Boss, [place] Pharmacist Assistant [phone]  I have reviewed the care management and care coordination activities outlined in this encounter and I am certifying that I agree with the content of this note. No further action required.  2 minutes spent in review, coordination, and documentation.  Mayford Knife, [facility] 03/19/20 8:28 AM

## 2020-02-29 NOTE — Telephone Encounter (Signed)
She stated she really doesn't know why it was stopped and she stated the back pain has moved closer to her hips. YL,RMA

## 2020-02-29 NOTE — Chronic Care Management (AMB) (Signed)
Chronic Care Management   CCM RN Visit Note  02/29/2020 Name: Anna Clay MRN: [id number] DOB: 1950/02/09  Subjective: Anna Clay is a 70 y.o. year old female who is a primary care patient of Glendale Chard, MD. The care management team was consulted for assistance with disease management and care coordination needs.    Engaged with patient by telephone for follow up visit in response to provider referral for case management and/or care coordination services.   Consent to Services:  The patient was given information about Chronic Care Management services, agreed to services, and gave verbal consent prior to initiation of services.  Please see initial visit note for detailed documentation.   Patient agreed to services and verbal consent obtained.   Assessment: Review of patient past medical history, allergies, medications, health status, including review of consultants reports, laboratory and other test data, was performed as part of comprehensive evaluation and provision of chronic care management services.   SDOH (Social Determinants of Health) assessments and interventions performed:  Yes, no acute needs  CCM Care Plan  Allergies  Allergen Reactions  . Allspice [Pimenta] Hives and Shortness Of Breath  . Black Cohosh Hives and Shortness Of Breath  . Peach [Prunus Persica] Hives and Shortness Of Breath    Fresh peaches  . Heparin Other (See Comments)    Blood clots  . Ivp Dye [Iodinated Diagnostic Agents] Other (See Comments)    Reaction unknown  . Tetracyclines & Related Other (See Comments)    Reaction unknown    Outpatient Encounter Medications as of 02/27/2020  Medication Sig  . Accu-Chek FastClix Lancets MISC USE AS DIRECTED TO CHECK BLOOD SUGARS 2 TIMES PER DAY DX:E11.65  . Accu-Chek Softclix Lancets lancets Use to check blood sugars twice daily E11.69  . acetaminophen (TYLENOL) 650 MG CR tablet Take 1,300 mg by mouth every 8 (eight) hours as needed for pain.  Marland Kitchen  allopurinol (ZYLOPRIM) 100 MG tablet Take 1 tablet (100 mg total) by mouth daily.  Marland Kitchen aspirin EC 81 MG tablet Take 81 mg by mouth daily.  . Blood Glucose Monitoring Suppl (ACCU-CHEK GUIDE) w/Device KIT USE AS DIRECTED TO CHECK BLOOD SUGARS 2 TIMES PER DAY DX:E11.65  . calcium carbonate (TUMS - DOSED IN MG ELEMENTAL CALCIUM) 500 MG chewable tablet Chew 2 tablets by mouth daily. 2 per day  . Cholecalciferol (VITAMIN D) 2000 UNITS CAPS Take 1 capsule by mouth daily.  . colchicine 0.6 MG tablet Take 1 tablet (0.6 mg total) by mouth daily.  Marland Kitchen ELIQUIS 5 MG TABS tablet Take 1 tablet (5 mg total) by mouth 2 (two) times daily.  Marland Kitchen EPINEPHrine (EPIPEN 2-PAK) 0.3 mg/0.3 mL IJ SOAJ injection Inject 0.3 mLs (0.3 mg total) into the muscle as needed for anaphylaxis.  . ferrous sulfate 325 (65 FE) MG EC tablet Take 325 mg by mouth daily.  . fexofenadine (ALLEGRA) 180 MG tablet Take 180 mg by mouth daily.  . folic acid (FOLVITE) 1 MG tablet TAKE ONE TABLET BY MOUTH DAILY  . gabapentin (NEURONTIN) 300 MG capsule Take 1 capsule (300 mg total) by mouth 3 (three) times daily.  Marland Kitchen glucose blood (ACCU-CHEK GUIDE) test strip Use to check blood sugars twice daily E11.69  . insulin glargine, 1 Unit Dial, (TOUJEO SOLOSTAR) 300 UNIT/ML Solostar Pen Inject 58 Units into the skin at bedtime. (Patient taking differently: Inject 25 Units into the skin at bedtime.)  . insulin lispro (HUMALOG KWIKPEN) 200 UNIT/ML KwikPen 6 units with breakfast, 6 units with  lunch, and 8 units with dinner (Patient taking differently: 10 units with breakfast, 10 units with lunch, and 14 units with dinner)  . magnesium oxide (MAG-OX) 400 MG tablet Take 400 mg by mouth daily.  . metoprolol succinate (TOPROL-XL) 25 MG 24 hr tablet Take 1 tablet (25 mg total) by mouth daily.  . Misc Natural Products (ELDERBERRY ZINC/VIT C/IMMUNE MT) Use as directed 1 capsule in the mouth or throat daily at 12 noon.  . Multiple Vitamin (MULTIVITAMIN WITH MINERALS) TABS  tablet Take 1 tablet by mouth daily.  . nitroGLYCERIN (NITROSTAT) 0.4 MG SL tablet Place 1 tablet (0.4 mg total) under the tongue every 5 (five) minutes as needed for chest pain.  Marland Kitchen NOVOFINE PEN NEEDLE 32G X 6 MM MISC USE 5 TO 8 NEEDLES PER DAY AS DIRECTED PER INSULIN PROTOCOL  . oxyCODONE (OXY IR/ROXICODONE) 5 MG immediate release tablet Take by mouth. (Patient not taking: No sig reported)  . pantoprazole (PROTONIX) 40 MG tablet Take 1 tablet (40 mg total) by mouth daily.  . Pitavastatin Calcium 4 MG TABS Take 1 tablet (4 mg total) by mouth daily.  Marland Kitchen torsemide (DEMADEX) 20 MG tablet Take 1 tablet (20 mg total) by mouth daily.  . vitamin E 180 MG (400 UNITS) capsule Take 400 Units by mouth daily.   No facility-administered encounter medications on file as of 02/27/2020.    Patient Active Problem List   Diagnosis Date Noted  . Sickle cell trait ([place]) 08/18/2018  . Iron deficiency anemia 10/24/2016  . Allergic rhinitis 06/07/2014  . OSA (obstructive sleep apnea) 10/05/2013  . Diabetes mellitus ([place]) 10/05/2013  . Anemia 10/05/2013  . Pulmonary hypertension ([place]) 10/04/2013  . Acute right-sided CHF (congestive heart failure) ([place]) 10/04/2013    Conditions to be addressed/monitored:DM II, Pulmonary Hypertension, OSA   Care Plan : Diabetes Type 2 (Adult)  Updates made by Lynne Logan, RN since 02/29/2020 12:00 AM    Problem: Glycemic Management (Diabetes, Type 2)   Priority: High    Long-Range Goal: Glycemic Management Optimized   Start Date: 02/27/2020  Expected End Date: 08/27/2020  This Visit's Progress: On track  Priority: High  Note:   Current Barriers:  Marland Kitchen Knowledge Deficits related to Self health management of Diabetes . Chronic Disease Management support and education needs related to DM II, Pulmonary Hypertension, OSA  Nurse Case Manager Clinical Goal(s):  Marland Kitchen Over the next 180 days, patient will continue to engage with the CCM team and PCP for ongoing support and disease  education to help improve her Self Health management of her DM as evidence patient will lower her A1c <7.0 CCM RN CM Interventions:  02/27/20 Successful call completed with patient  . Evaluation of current treatment plan related to Diabetes and patient's adherence to plan as established by provider . Reviewed patient's current A1c of 7.0; Educated on target A1c, daily glycemic control FBS 80-130, <180 after meals; Educated on 15'15' rule . Determined patient is Self monitoring her blood sugars at home, with average in the low 100's . Educated patient on dietary and exercise recommendations . Educated with rationale importance of ongoing CBG monitoring daily before meals and at bedtime, and to notify the CCM team and or PCP for abnormal results . Discussed next PCP follow up visit with Dr. Baird Cancer scheduled for 04/12/20 @10 :30 AM . Discussed plans with patient for ongoing care management follow up and provided patient with direct contact information for care management team Patient Self Care Activities:  . Continue  to monitor blood sugars and record as discussed . Continue to follow dietary and exercise recommendations . Patient will self administer medications as prescribed . Patient will attend all scheduled provider appointments . Patient will call pharmacy for medication refills . Patient will call provider office for new concerns or questions Next Follow up date: 04/23/20     Plan:Telephone follow up appointment with care management team member scheduled for:  04/23/20  [name], RN, BSN, CCM Care Management Coordinator [facility]  Direct Phone: [phone]

## 2020-02-29 NOTE — Patient Instructions (Signed)
Goals Addressed      Other   .  Diabetes treatment optimized   On track     Timeframe:  Long-Range Goal Priority:  High Start Date:  02/27/20                           Expected End Date:  08/26/20  Next Follow Up Date: 04/23/20  . Continue to monitor blood sugars and record as discussed . Continue to follow dietary and exercise recommendations . Patient will self administer medications as prescribed . Patient will attend all scheduled provider appointments . Patient will call pharmacy for medication refills . Patient will call provider office for new concerns or questions

## 2020-03-05 ENCOUNTER — Other Ambulatory Visit: Payer: Self-pay

## 2020-03-06 ENCOUNTER — Ambulatory Visit: Payer: Medicare Other

## 2020-03-06 DIAGNOSIS — I272 Pulmonary hypertension, unspecified: Secondary | ICD-10-CM

## 2020-03-06 DIAGNOSIS — E1122 Type 2 diabetes mellitus with diabetic chronic kidney disease: Secondary | ICD-10-CM

## 2020-03-06 DIAGNOSIS — N183 Chronic kidney disease, stage 3 unspecified: Secondary | ICD-10-CM

## 2020-03-06 NOTE — Chronic Care Management (AMB) (Signed)
Chronic Care Management    Social Work Note  03/06/2020 Name: Anna Clay MRN: [id number] DOB: Apr 20, 1950  [name] is a 70 y.o. year old female who is a primary care patient of Glendale Chard, MD. The CCM team was consulted to assist the patient with chronic disease management and/or care coordination needs related to: [employer] .   Engaged with patient by telephone for follow up visit in response to provider referral for social work chronic care management and care coordination services.   Consent to Services:  The patient was given information about Chronic Care Management services, agreed to services, and gave verbal consent prior to initiation of services.  Please see initial visit note for detailed documentation.   Patient agreed to services and consent obtained.   Assessment: Review of patient past medical history, allergies, medications, and health status, including review of relevant consultants reports was performed today as part of a comprehensive evaluation and provision of chronic care management and care coordination services.     SDOH (Social Determinants of Health) assessments and interventions performed:    Advanced Directives Status: Not addressed in this encounter.  CCM Care Plan  Allergies  Allergen Reactions  . Allspice [Pimenta] Hives and Shortness Of Breath  . Black Cohosh Hives and Shortness Of Breath  . Peach [Prunus Persica] Hives and Shortness Of Breath    Fresh peaches  . Heparin Other (See Comments)    Blood clots  . Ivp Dye [Iodinated Diagnostic Agents] Other (See Comments)    Reaction unknown  . Tetracyclines & Related Other (See Comments)    Reaction unknown    Outpatient Encounter Medications as of 03/06/2020  Medication Sig  . Accu-Chek FastClix Lancets MISC USE AS DIRECTED TO CHECK BLOOD SUGARS 2 TIMES PER DAY DX:E11.65  . Accu-Chek Softclix Lancets lancets Use to check blood sugars twice daily E11.69  . acetaminophen  (TYLENOL) 650 MG CR tablet Take 1,300 mg by mouth every 8 (eight) hours as needed for pain.  Marland Kitchen allopurinol (ZYLOPRIM) 100 MG tablet Take 1 tablet (100 mg total) by mouth daily.  Marland Kitchen aspirin EC 81 MG tablet Take 81 mg by mouth daily.  . Blood Glucose Monitoring Suppl (ACCU-CHEK GUIDE) w/Device KIT USE AS DIRECTED TO CHECK BLOOD SUGARS 2 TIMES PER DAY DX:E11.65  . calcium carbonate (TUMS - DOSED IN MG ELEMENTAL CALCIUM) 500 MG chewable tablet Chew 2 tablets by mouth daily. 2 per day  . Cholecalciferol (VITAMIN D) 2000 UNITS CAPS Take 1 capsule by mouth daily.  . colchicine 0.6 MG tablet Take 1 tablet (0.6 mg total) by mouth daily.  Marland Kitchen ELIQUIS 5 MG TABS tablet Take 1 tablet (5 mg total) by mouth 2 (two) times daily.  Marland Kitchen EPINEPHrine (EPIPEN 2-PAK) 0.3 mg/0.3 mL IJ SOAJ injection Inject 0.3 mLs (0.3 mg total) into the muscle as needed for anaphylaxis.  . ferrous sulfate 325 (65 FE) MG EC tablet Take 325 mg by mouth daily.  . fexofenadine (ALLEGRA) 180 MG tablet Take 180 mg by mouth daily.  . folic acid (FOLVITE) 1 MG tablet TAKE ONE TABLET BY MOUTH DAILY  . gabapentin (NEURONTIN) 300 MG capsule Take 1 capsule (300 mg total) by mouth 3 (three) times daily.  Marland Kitchen glucose blood (ACCU-CHEK GUIDE) test strip Use to check blood sugars twice daily E11.69  . insulin glargine, 1 Unit Dial, (TOUJEO SOLOSTAR) 300 UNIT/ML Solostar Pen Inject 58 Units into the skin at bedtime. (Patient taking differently: Inject 25 Units into the skin at  bedtime.)  . insulin lispro (HUMALOG KWIKPEN) 200 UNIT/ML KwikPen 6 units with breakfast, 6 units with lunch, and 8 units with dinner (Patient taking differently: 10 units with breakfast, 10 units with lunch, and 14 units with dinner)  . magnesium oxide (MAG-OX) 400 MG tablet Take 400 mg by mouth daily.  . metoprolol succinate (TOPROL-XL) 25 MG 24 hr tablet Take 1 tablet (25 mg total) by mouth daily.  . Misc Natural Products (ELDERBERRY ZINC/VIT C/IMMUNE MT) Use as directed 1 capsule in  the mouth or throat daily at 12 noon.  . Multiple Vitamin (MULTIVITAMIN WITH MINERALS) TABS tablet Take 1 tablet by mouth daily.  . nitroGLYCERIN (NITROSTAT) 0.4 MG SL tablet Place 1 tablet (0.4 mg total) under the tongue every 5 (five) minutes as needed for chest pain.  Marland Kitchen NOVOFINE PEN NEEDLE 32G X 6 MM MISC USE 5 TO 8 NEEDLES PER DAY AS DIRECTED PER INSULIN PROTOCOL  . oxyCODONE (OXY IR/ROXICODONE) 5 MG immediate release tablet Take by mouth. (Patient not taking: No sig reported)  . pantoprazole (PROTONIX) 40 MG tablet Take 1 tablet (40 mg total) by mouth daily.  . Pitavastatin Calcium 4 MG TABS Take 1 tablet (4 mg total) by mouth daily.  Marland Kitchen torsemide (DEMADEX) 20 MG tablet Take 1 tablet (20 mg total) by mouth daily.  . vitamin E 180 MG (400 UNITS) capsule Take 400 Units by mouth daily.   No facility-administered encounter medications on file as of 03/06/2020.    Patient Active Problem List   Diagnosis Date Noted  . Sickle cell trait ([place]) 08/18/2018  . Iron deficiency anemia 10/24/2016  . Allergic rhinitis 06/07/2014  . OSA (obstructive sleep apnea) 10/05/2013  . Diabetes mellitus ([place]) 10/05/2013  . Anemia 10/05/2013  . Pulmonary hypertension ([place]) 10/04/2013  . Acute right-sided CHF (congestive heart failure) ([place]) 10/04/2013    Conditions to be addressed/monitored: HTN and DMII; care coordination  Care Plan : Social Work  Updates made by Daneen Schick since 03/06/2020 12:00 AM    Problem: Care Coordination     Long-Range Goal: Collaborate with RN Care Manager to perform appropriate assessments to assist with care coordination needs   Start Date: 02/01/2020  Expected End Date: 05/31/2020  Recent Progress: On track  Priority: Medium  Note:   Current Barriers:  . Limited access to food . ADL IADL limitations . Chronic conditions including DM II and Pulmonary Hypertension which place patient at increased risk of hospitalization  Social Work Clinical Goal(s):  Marland Kitchen Over the next  120 days, patient will work with SW to address concerns related to care coordination  Interventions: . 1:1 collaboration with Glendale Chard, MD regarding development and update of comprehensive plan of care as evidenced by provider attestation and co-signature . Inter-disciplinary care team collaboration (see longitudinal plan of care) . Successful outbound call placed to assist with care coordination needs . Discussed the patient was interested in a [employer] and is awaiting to hear if her insurance will cover the device . Patient reports the previous claim was denied but she feels this was due to a coding issue . Advised the patient SW would collaborate with embedded pharmacy team to determine status of request . Collaboration with Pattricia Boss to request update on device for the patient . Scheduled follow up call over the next month Patient Goals/Self-Care Activities Over the next 30 days, patient will:   - Patient will self administer medications as prescribed Patient will attend all scheduled provider appointments Engage with  embedded pharmacy team to address diabetic testing supply questions Contact SW as needed prior to next scheduled call  Follow up Plan: SW will follow up with patient by phone over the next month       Follow Up Plan: SW will follow up with patient by phone over the next month      Daneen Schick, BSW, CDP Social Worker, Certified Dementia Practitioner [place] [facility] [phone]  Total time spent performing care coordination and/or care management activities with the patient by phone or face to face = 20 minutes.

## 2020-03-06 NOTE — Patient Instructions (Signed)
Goals we discussed today:  Goals Addressed            This Visit's Progress   . Work with SW to manage care coordination needs       Timeframe:  Long-Range Goal Priority:  [employer] Start Date:  1.12.22                           Expected End Date:  5.12.22                   Next planned outreach date: 3.15.22  Patient Goals/Self-Care Activities Over the next 30 days, patient will:   - Patient will self administer medications as prescribed Patient will attend all scheduled provider appointments Engage with embedded pharmacy team to address diabetic testing supply questions Contact SW as needed prior to next scheduled call

## 2020-03-19 ENCOUNTER — Encounter: Payer: Self-pay | Admitting: Nurse Practitioner

## 2020-03-22 ENCOUNTER — Encounter: Payer: Self-pay | Admitting: Nurse Practitioner

## 2020-04-03 ENCOUNTER — Ambulatory Visit (INDEPENDENT_AMBULATORY_CARE_PROVIDER_SITE_OTHER): Payer: Medicare Other

## 2020-04-03 DIAGNOSIS — E1122 Type 2 diabetes mellitus with diabetic chronic kidney disease: Secondary | ICD-10-CM

## 2020-04-03 DIAGNOSIS — Z794 Long term (current) use of insulin: Secondary | ICD-10-CM | POA: Diagnosis not present

## 2020-04-03 DIAGNOSIS — N183 Chronic kidney disease, stage 3 unspecified: Secondary | ICD-10-CM

## 2020-04-03 DIAGNOSIS — I272 Pulmonary hypertension, unspecified: Secondary | ICD-10-CM

## 2020-04-03 NOTE — Chronic Care Management (AMB) (Signed)
Chronic Care Management    Social Work Note  04/03/2020 Name: BILLIEJO SORTO MRN: [id number] DOB: [birth year]  [name] is a 70 y.o. year old female who is a primary care patient of Glendale Chard, MD. The CCM team was consulted to assist the patient with chronic disease management and/or care coordination needs related to: [employer] .   Engaged with patient by telephone for follow up visit in response to provider referral for social work chronic care management and care coordination services.   Consent to Services:  The patient was given information about Chronic Care Management services, agreed to services, and gave verbal consent prior to initiation of services.  Please see initial visit note for detailed documentation.   Patient agreed to services and consent obtained.   Assessment: Review of patient past medical history, allergies, medications, and health status, including review of relevant consultants reports was performed today as part of a comprehensive evaluation and provision of chronic care management and care coordination services.     SDOH (Social Determinants of Health) assessments and interventions performed:    Advanced Directives Status: Not addressed in this encounter.  CCM Care Plan  Allergies  Allergen Reactions  . Allspice [Pimenta] Hives and Shortness Of Breath  . Black Cohosh Hives and Shortness Of Breath  . Peach [Prunus Persica] Hives and Shortness Of Breath    Fresh peaches  . Heparin Other (See Comments)    Blood clots  . Ivp Dye [Iodinated Diagnostic Agents] Other (See Comments)    Reaction unknown  . Tetracyclines & Related Other (See Comments)    Reaction unknown    Outpatient Encounter Medications as of 04/03/2020  Medication Sig  . Accu-Chek FastClix Lancets MISC USE AS DIRECTED TO CHECK BLOOD SUGARS 2 TIMES PER DAY DX:E11.65  . Accu-Chek Softclix Lancets lancets Use to check blood sugars twice daily E11.69  . acetaminophen  (TYLENOL) 650 MG CR tablet Take 1,300 mg by mouth every 8 (eight) hours as needed for pain.  Marland Kitchen allopurinol (ZYLOPRIM) 100 MG tablet Take 1 tablet (100 mg total) by mouth daily.  Marland Kitchen aspirin EC 81 MG tablet Take 81 mg by mouth daily.  . Blood Glucose Monitoring Suppl (ACCU-CHEK GUIDE) w/Device KIT USE AS DIRECTED TO CHECK BLOOD SUGARS 2 TIMES PER DAY DX:E11.65  . calcium carbonate (TUMS - DOSED IN MG ELEMENTAL CALCIUM) 500 MG chewable tablet Chew 2 tablets by mouth daily. 2 per day  . Cholecalciferol (VITAMIN D) 2000 UNITS CAPS Take 1 capsule by mouth daily.  . colchicine 0.6 MG tablet Take 1 tablet (0.6 mg total) by mouth daily.  Marland Kitchen ELIQUIS 5 MG TABS tablet Take 1 tablet (5 mg total) by mouth 2 (two) times daily.  Marland Kitchen EPINEPHrine (EPIPEN 2-PAK) 0.3 mg/0.3 mL IJ SOAJ injection Inject 0.3 mLs (0.3 mg total) into the muscle as needed for anaphylaxis.  . ferrous sulfate 325 (65 FE) MG EC tablet Take 325 mg by mouth daily.  . fexofenadine (ALLEGRA) 180 MG tablet Take 180 mg by mouth daily.  . folic acid (FOLVITE) 1 MG tablet TAKE ONE TABLET BY MOUTH DAILY  . gabapentin (NEURONTIN) 300 MG capsule Take 1 capsule (300 mg total) by mouth 3 (three) times daily.  Marland Kitchen glucose blood (ACCU-CHEK GUIDE) test strip Use to check blood sugars twice daily E11.69  . insulin glargine, 1 Unit Dial, (TOUJEO SOLOSTAR) 300 UNIT/ML Solostar Pen Inject 58 Units into the skin at bedtime. (Patient taking differently: Inject 25 Units into the skin at  bedtime.)  . insulin lispro (HUMALOG KWIKPEN) 200 UNIT/ML KwikPen 6 units with breakfast, 6 units with lunch, and 8 units with dinner (Patient taking differently: 10 units with breakfast, 10 units with lunch, and 14 units with dinner)  . magnesium oxide (MAG-OX) 400 MG tablet Take 400 mg by mouth daily.  . metoprolol succinate (TOPROL-XL) 25 MG 24 hr tablet Take 1 tablet (25 mg total) by mouth daily.  . Misc Natural Products (ELDERBERRY ZINC/VIT C/IMMUNE MT) Use as directed 1 capsule in  the mouth or throat daily at 12 noon.  . Multiple Vitamin (MULTIVITAMIN WITH MINERALS) TABS tablet Take 1 tablet by mouth daily.  . nitroGLYCERIN (NITROSTAT) 0.4 MG SL tablet Place 1 tablet (0.4 mg total) under the tongue every 5 (five) minutes as needed for chest pain.  Marland Kitchen NOVOFINE PEN NEEDLE 32G X 6 MM MISC USE 5 TO 8 NEEDLES PER DAY AS DIRECTED PER INSULIN PROTOCOL  . oxyCODONE (OXY IR/ROXICODONE) 5 MG immediate release tablet Take by mouth. (Patient not taking: No sig reported)  . pantoprazole (PROTONIX) 40 MG tablet Take 1 tablet (40 mg total) by mouth daily.  . Pitavastatin Calcium 4 MG TABS Take 1 tablet (4 mg total) by mouth daily.  Marland Kitchen torsemide (DEMADEX) 20 MG tablet Take 1 tablet (20 mg total) by mouth daily.  . vitamin E 180 MG (400 UNITS) capsule Take 400 Units by mouth daily.   No facility-administered encounter medications on file as of 04/03/2020.    Patient Active Problem List   Diagnosis Date Noted  . Sickle cell trait ([place]) 08/18/2018  . Iron deficiency anemia 10/24/2016  . Allergic rhinitis 06/07/2014  . OSA (obstructive sleep apnea) 10/05/2013  . Diabetes mellitus ([place]) 10/05/2013  . Anemia 10/05/2013  . Pulmonary hypertension ([place]) 10/04/2013  . Acute right-sided CHF (congestive heart failure) ([place]) 10/04/2013    Conditions to be addressed/monitored: HTN and DMII  Care Plan : Social Work  Updates made by Daneen Schick since 04/03/2020 12:00 AM    Problem: Care Coordination     Long-Range Goal: Collaborate with RN Care Manager to perform appropriate assessments to assist with care coordination needs   Start Date: 02/01/2020  Expected End Date: 05/31/2020  This Visit's Progress: On track  Recent Progress: On track  Priority: Medium  Note:   Current Barriers:  . Limited access to food . ADL IADL limitations . Chronic conditions including DM II and Pulmonary Hypertension which place patient at increased risk of hospitalization  Social Work Clinical Goal(s):   Marland Kitchen Over the next 120 days, patient will work with SW to address concerns related to care coordination  Interventions: . 1:1 collaboration with Glendale Chard, MD regarding development and update of comprehensive plan of care as evidenced by provider attestation and co-signature . Inter-disciplinary care team collaboration (see longitudinal plan of care) . Successful outbound call placed to assist with care coordination needs . Discussed the patient is in need of an ultrasound to her legs but is having difficulty securing an appointment at a time that her daughter can transport her . Advised the patient SW would collaborate with her primary provider Dr. Baird Cancer to discuss if the patient would qualify for services through [facility] for in home ultrasound services . Collaboration with Dr. Baird Cancer to provide information on [facility] and determine if the patient would be appropriate for services . Determined the patient has received her Free [employer] . Collaboration with embedded pharmacy team to provide an update . Patient reports the  previous claim was denied but she feels this was due to a coding issue . Advised the patient SW would collaborate with embedded pharmacy team to determine status of request . Scheduled follow up call over the next month  Patient Goals/Self-Care Activities Over the next 30 days, patient will:   - Patient will self administer medications as prescribed Patient will attend all scheduled provider appointments Engage with referral coordinator regarding status of referral for ultrasound Contact SW as needed prior to next scheduled call  Follow up Plan: SW will follow up with patient by phone over the next month       Follow Up Plan: SW will follow up with patient by phone over the next month.      Daneen Schick, BSW, CDP Social Worker, Certified Dementia Practitioner [place] [facility] [phone]  Total time spent performing care  coordination and/or care management activities with the patient by phone or face to face = 20 minutes.

## 2020-04-03 NOTE — Patient Instructions (Signed)
Social Worker Visit Information  Goals we discussed today:  Goals Addressed            This Visit's Progress   . Work with SW to manage care coordination needs   On track    Timeframe:  Long-Range Goal Priority:  [employer] Start Date:  1.12.22                           Expected End Date:  5.12.22                   Next planned outreach date: 4.5.22  Patient Goals/Self-Care Activities Over the next 30 days, patient will:   - Patient will self administer medications as prescribed Patient will attend all scheduled provider appointments Engage with referral coordinator regarding status of referral for ultrasound Contact SW as needed prior to next scheduled call        Follow Up Plan: SW will follow up with patient by phone over the next month   Daneen Schick, BSW, CDP Social Worker, Certified Dementia Practitioner [place] [facility] [place] [facility] [phone]

## 2020-04-06 ENCOUNTER — Telehealth: Payer: Self-pay

## 2020-04-06 NOTE — Chronic Care Management (AMB) (Signed)
  Chronic Care Management Pharmacy Assistant   Name: Anna Clay  MRN: [id number] DOB: 09/17/1950  Reason for Encounter: Medication Review   Recent office visits:  03/06/2020- Anna Clay (CCM) 04/03/2020- Anna Clay (CCM) 04/12/2020- Anna Clay (PCP) Recent consult visits:  None  Hospital visits:  Medication Reconciliation was completed by comparing discharge summary, patient's EMR and Pharmacy list, and upon discussion with patient.  Admitted to the hospital on 04/13/2020 due to Symptomatic Anemia. Discharge date was 04/16/2020. Discharged from [facility].    New?Medications Started at Hospital Discharge:?? -started None  Medication Changes at Hospital Discharge: -Changed- None  Medications Discontinued at Hospital Discharge: -Stopped Asa 81 mg due to Anemia and GI hemorrhage  Medications that remain the same after Hospital Discharge:??  -All other medications will remain the same.    Patient mentioned that during Hospital stay Pantoprazole 40 mg was increased to twice daily and her Eliquis 5 mg was held for 3 days so they could perform EGD and Colonoscopy.  Reviewed chart for medication changes ahead of medication coordination call.  Medication changes indicated- Asa 81 mg discontinued at Hospital Discharge.  BP Readings from Last 3 Encounters:  04/12/20 138/72  01/30/20 (!) 165/100  06/21/19 (!) 146/88    Lab Results  Component Value Date   HGBA1C 7.0 01/31/2020     Patient obtains medications through Adherence Packaging  90 Days   Last adherence delivery included: Colchicine 0.6 mg- 1 tablet daily  Patient declined the following medications last month:  Allopurinol 100 mg- 1 tablet dailydue to receiving a 51 day supply on 02/27/2020- Patient also takes PRN  Gabapentin 300 mg- 1 capsule three times daily due to receiving a 51 day supply on 02/27/2020    Freestyle Kit sensor- Use to check blood sugars three times daily   Freestyle  Reader- Use to check blood sugars three times daily   Accu-check Guide Glucometer- Use to check blood sugars three times daily  Accu-check Guide Lancets Device- Use to check blood sugars three times daily  Accu-check Guide test strips- Use to check blood sugars three times daily                                     Due to not being covered by insurance  Eliquis 5 mg- 1 tablet twice daily  Pantoprazole 40 mg- 1 tablet daily  Humalog 200 u/ml kwikpen- Inject 10 units with breakfast, lunch and dinner. ? Due to receiving a 30-day supply on 02/21/2020.   Metoprolol 25 mg- 1 tablet daily before breakfast  Torsemide 20 mg- 1 tablet daily ? Due to receiving a 90-day supply on 01/19/2020   Toujeo- Inject 58 units sq at bedtimedue to having 2 full boxes with 3 pens in each.  Amlodipine 5 mg due to being discontinued  Cyclobenzaprine 10 mg- due to no longer taking  Nitroglycerin 0.4 mg due to prn use  Livalo 4 mg due to being discontinued  Epinephrine 0.3 mg/0.3 ml due to prn use  Spironolactone 25 mg due to being discontinued.    Patient is due for next adherence delivery on: 04/13/2020. Called patient and reviewed medications and coordinated delivery. Spoke with patient, she has an appointment with PCP on 04/12/2020. Will follow up with patient 04/13/2020 for any medications changes or additions. 04/13/2020- Patient admitted to [facility]. 04/17/2020- Patient was admitted to [facility] from 04/13/2020 to 04/16/2020, followed up with patient to review medications and coordinate delivery.   This delivery to include: Metoprolol 25 mg- 1 tablet daily (before breakfast) Torsemide 20 mg- 1 tablet daily (before breakfast) Colchicine 0.6 mg- 1 tablet daily prn (vials) Pantoprazole 40 mg- 1 tablet twice daily( direction change)- (before breakfast, evening meal) Allopurinol 100 mg- 1 tablet daily(breakfast) Gabapentin 300 mg- 1 capsule three times  daily (before breakfast, evening meal, bedtime)   No short fill or Acute fill needed.  Patient declined the following medications:  Freestyle Kit sensor- Use to check blood sugars three times daily   Freestyle Reader- Use to check blood sugars three times daily   Accu-check Guide Glucometer- Use to check blood sugars three times daily  Accu-check Guide Lancets Device- Use to check blood sugars three times daily  Accu-check Guide test strips- Use to check blood sugars three times daily                                     Due to not being covered by insurance- Patient                                  receives [employer] from [employer].   Toujeo- Inject 58 units sq at bedtimedue to having 1 full box with 3 pens on hand.  Amlodipine 5 mg due to being discontinued  Cyclobenzaprine 10 mg- due to no longer taking  Nitroglycerin 0.4 mg due to prn use  Livalo 4 mg due to being discontinued  Epinephrine 0.3 mg/0.3 ml due to prn use  Spironolactone 25 mg due to being discontinued.   Asa 81 mg- due to being discontinued.      Patient needs refills for Allopurinol 100 mg- Request sent to PCP and Pantoprazole 40 mg- 1 tablet twice daily - Request sent to PCP.  Confirmed delivery date of 04/18/2020, advised patient that pharmacy will contact them the morning of delivery.  Medications: Outpatient Encounter Medications as of 04/06/2020  Medication Sig  . Accu-Chek FastClix Lancets MISC USE AS DIRECTED TO CHECK BLOOD SUGARS 2 TIMES PER DAY DX:E11.65  . Accu-Chek Softclix Lancets lancets Use to check blood sugars twice daily E11.69  . acetaminophen (TYLENOL) 650 MG CR tablet Take 1,300 mg by mouth every 8 (eight) hours as needed for pain.  Marland Kitchen aspirin EC 81 MG tablet Take 81 mg by mouth daily.  . Blood Glucose Monitoring Suppl (ACCU-CHEK GUIDE) w/Device KIT USE AS DIRECTED TO CHECK BLOOD SUGARS 2 TIMES PER DAY DX:E11.65  . calcium carbonate (TUMS - DOSED IN MG ELEMENTAL  CALCIUM) 500 MG chewable tablet Chew 2 tablets by mouth daily. 2 per day  . Cholecalciferol (VITAMIN D) 2000 UNITS CAPS Take 1 capsule by mouth daily.  . colchicine 0.6 MG tablet Take 1 tablet (0.6 mg total) by mouth daily.  Marland Kitchen ELIQUIS 5 MG TABS tablet Take 1 tablet (5 mg total) by mouth 2 (two) times daily.  Marland Kitchen EPINEPHrine (EPIPEN 2-PAK) 0.3 mg/0.3 mL IJ SOAJ injection Inject 0.3 mLs (0.3 mg total) into the muscle as needed for anaphylaxis.  . ferrous sulfate 325 (65 FE) MG EC tablet Take 325 mg by mouth daily.  . fexofenadine (ALLEGRA) 180 MG tablet Take 180 mg by mouth daily.  . folic acid (FOLVITE) 1 MG tablet TAKE ONE TABLET  BY MOUTH DAILY  . gabapentin (NEURONTIN) 300 MG capsule Take 1 capsule (300 mg total) by mouth 3 (three) times daily.  Marland Kitchen glucose blood (ACCU-CHEK GUIDE) test strip Use to check blood sugars twice daily E11.69  . insulin glargine, 1 Unit Dial, (TOUJEO SOLOSTAR) 300 UNIT/ML Solostar Pen Inject 58 Units into the skin at bedtime. (Patient taking differently: Inject 25 Units into the skin at bedtime.)  . insulin lispro (HUMALOG KWIKPEN) 200 UNIT/ML KwikPen 6 units with breakfast, 6 units with lunch, and 8 units with dinner (Patient taking differently: 10 units with breakfast, 10 units with lunch, and 14 units with dinner)  . magnesium oxide (MAG-OX) 400 MG tablet Take 400 mg by mouth daily.  . metoprolol succinate (TOPROL-XL) 25 MG 24 hr tablet Take 1 tablet (25 mg total) by mouth daily.  . Misc Natural Products (ELDERBERRY ZINC/VIT C/IMMUNE MT) Use as directed 1 capsule in the mouth or throat daily at 12 noon.  . Multiple Vitamin (MULTIVITAMIN WITH MINERALS) TABS tablet Take 1 tablet by mouth daily.  . nitroGLYCERIN (NITROSTAT) 0.4 MG SL tablet Place 1 tablet (0.4 mg total) under the tongue every 5 (five) minutes as needed for chest pain.  Marland Kitchen NOVOFINE PEN NEEDLE 32G X 6 MM MISC USE 5 TO 8 NEEDLES PER DAY AS DIRECTED PER INSULIN PROTOCOL  . oxyCODONE (OXY IR/ROXICODONE) 5 MG  immediate release tablet Take by mouth.  . pantoprazole (PROTONIX) 40 MG tablet Take 1 tablet (40 mg total) by mouth daily.  . Pitavastatin Calcium 4 MG TABS Take 1 tablet (4 mg total) by mouth daily.  Marland Kitchen torsemide (DEMADEX) 20 MG tablet Take 1 tablet (20 mg total) by mouth daily.  . vitamin E 180 MG (400 UNITS) capsule Take 400 Units by mouth daily.  . [DISCONTINUED] allopurinol (ZYLOPRIM) 100 MG tablet Take 1 tablet (100 mg total) by mouth daily.   No facility-administered encounter medications on file as of 04/06/2020.    Star Rating Drugs: Atorvastatin 20 mg- Last filled 11/02/2019 for 30 days supply   Note:  Patient has a f/u visit with [facility] 04/12/2020, coordination call will be updated and completed after visit. Patient mentioned that she did receive her Freestyle [place] monitor and sensors from [place] and she has been alarmed every night with low blood sugar readings, her blood sugar will get down to 65 and she will have get up and eat something to bring her bs back up or the alarm will not go off. Advise patient to make sure she has a snack before she goes to bed, this will help lower chances for having low blood sugar readings in the middle of the night, her levels are dangerously low and it is a good thing she has the monitor now.Marland KitchenMarland KitchenDiabetic Coma..... Patient agrees with plan, she will have a snack before she goes to bed.    Called patient 04/17/2020- Mrs Kozuch was hospitalized 3/25-3/28 for anemia, she had a 2 liter of blood transfused and an EGD and colonoscopy, EGD showed 3 lesions, Colonoscopy showed diverticulosis and 3 polyps that were removed, they increased her pantoprazole to 40 mg twice daily but not discharged with this plan. Would it be best for patient to continue with taking twice a day? Patient aware I will discuss with CPP to discuss with PCP and will update before her delivery.  04/18/2020- Called patient, per PharmD recommendations, patient should continue with  Pantoprazole 40 mg bid, new prescription requested to be added to breakfast and evening meal packs.  Clarification also given to patient regarding Allopurinol and Colchicine. Patient should take Allopurinol once daily as maintenance for her Gout and Colchicine to be used as needed for Gout flare up's. Patient aware Colchicine will come in a vial for PRN use and Allopurinol will remain in packaging. Patient verbalizes understanding.   Sending staff message to Laverda Sorenson to schedule patient with Orlando Penner, CPP next month for f/u pharmacy visit.   SIG: Pattricia Boss, [place] Pharmacist Assistant [phone]

## 2020-04-12 ENCOUNTER — Ambulatory Visit (INDEPENDENT_AMBULATORY_CARE_PROVIDER_SITE_OTHER): Payer: Medicare Other | Admitting: Internal Medicine

## 2020-04-12 ENCOUNTER — Ambulatory Visit: Payer: Medicare Other | Admitting: Internal Medicine

## 2020-04-12 ENCOUNTER — Encounter: Payer: Self-pay | Admitting: Internal Medicine

## 2020-04-12 ENCOUNTER — Other Ambulatory Visit: Payer: Self-pay

## 2020-04-12 VITALS — BP 138/72 | HR 89 | Temp 98.0°F | Wt 246.2 lb

## 2020-04-12 DIAGNOSIS — N1831 Chronic kidney disease, stage 3a: Secondary | ICD-10-CM

## 2020-04-12 DIAGNOSIS — Z23 Encounter for immunization: Secondary | ICD-10-CM | POA: Diagnosis not present

## 2020-04-12 DIAGNOSIS — Z794 Long term (current) use of insulin: Secondary | ICD-10-CM | POA: Diagnosis not present

## 2020-04-12 DIAGNOSIS — I129 Hypertensive chronic kidney disease with stage 1 through stage 4 chronic kidney disease, or unspecified chronic kidney disease: Secondary | ICD-10-CM | POA: Diagnosis not present

## 2020-04-12 DIAGNOSIS — M1A3720 Chronic gout due to renal impairment, left ankle and foot, without tophus (tophi): Secondary | ICD-10-CM | POA: Diagnosis not present

## 2020-04-12 DIAGNOSIS — D573 Sickle-cell trait: Secondary | ICD-10-CM | POA: Diagnosis not present

## 2020-04-12 DIAGNOSIS — Z6841 Body Mass Index (BMI) 40.0 and over, adult: Secondary | ICD-10-CM

## 2020-04-12 DIAGNOSIS — E1122 Type 2 diabetes mellitus with diabetic chronic kidney disease: Secondary | ICD-10-CM | POA: Diagnosis not present

## 2020-04-12 DIAGNOSIS — I272 Pulmonary hypertension, unspecified: Secondary | ICD-10-CM | POA: Diagnosis not present

## 2020-04-12 LAB — POCT URINALYSIS DIPSTICK
Bilirubin, UA: NEGATIVE
Blood, UA: NEGATIVE
Glucose, UA: NEGATIVE
Ketones, UA: NEGATIVE
Leukocytes, UA: NEGATIVE
Nitrite, UA: NEGATIVE
Protein, UA: POSITIVE — AB
Spec Grav, UA: 1.015 (ref 1.010–1.025)
Urobilinogen, UA: 0.2 E.U./dL
pH, UA: 7 (ref 5.0–8.0)

## 2020-04-12 LAB — POCT UA - MICROALBUMIN

## 2020-04-12 NOTE — Progress Notes (Signed)
Earleen Newport as a Education administrator for Maximino Greenland, MD.,have documented all relevant documentation on the behalf of Maximino Greenland, MD,as directed by  Maximino Greenland, MD while in the presence of Maximino Greenland, MD. This visit occurred during the SARS-CoV-2 public health emergency.  Safety protocols were in place, including screening questions prior to the visit, additional usage of staff PPE, and extensive cleaning of exam room while observing appropriate contact time as indicated for disinfecting solutions.  Subjective:     Patient ID: Anna Clay , female    DOB: 12-04-1950 , 70 y.o.   MRN: [id number]   Chief Complaint  Patient presents with  . Diabetes  . Hypertension    HPI  Pt is here today for a blood pressure check & diabetes follow up.  She reports compliance with meds. Denies headaches, chest pain and shortness of breath. I have not seen her since March 2021. She now lives in [place], [place] and no longer drives. Depends on son/DIL for transportation.   Diabetes She presents for her follow-up diabetic visit. She has type 2 diabetes mellitus. There are no hypoglycemic associated symptoms. Pertinent negatives for diabetes include no blurred vision and no chest pain. There are no hypoglycemic complications. Diabetic complications include nephropathy. Risk factors for coronary artery disease include diabetes mellitus, dyslipidemia, hypertension, obesity, post-menopausal and sedentary lifestyle. She participates in exercise intermittently. An ACE inhibitor/angiotensin II receptor blocker is being taken.  Hypertension This is a chronic problem. The current episode started more than 1 year ago. The problem has been gradually improving since onset. The problem is controlled. Pertinent negatives include no blurred vision, chest pain, palpitations or shortness of breath. Hypertensive end-organ damage includes heart failure. Identifiable causes of hypertension include chronic renal disease  and sleep apnea.     Past Medical History:  Diagnosis Date  . Chest pain   . Diabetes mellitus with kidney disease ([place])   . Diastolic heart failure ([place])   . Gout   . Hypercholesteremia   . Hypertension   . Obesity   . OSA (obstructive sleep apnea)   . Oxygen deficiency      Family History  Problem Relation Age of Onset  . Heart disease Father   . Heart attack Father   . Stroke Father   . Heart disease Sister   . Cancer - Lung Sister   . Sickle cell trait Other        all family members  . Cancer Brother        bone  . Diabetes Brother   . Diabetes Sister   . Thyroid disease Mother   . Goiter Mother      Current Outpatient Medications:  .  Accu-Chek FastClix Lancets MISC, USE AS DIRECTED TO CHECK BLOOD SUGARS 2 TIMES PER DAY DX:E11.65, Disp: 150 each, Rfl: 2 .  Accu-Chek Softclix Lancets lancets, Use to check blood sugars twice daily E11.69, Disp: 100 each, Rfl: 2 .  acetaminophen (TYLENOL) 650 MG CR tablet, Take 1,300 mg by mouth every 8 (eight) hours as needed for pain., Disp: , Rfl:  .  aspirin EC 81 MG tablet, Take 81 mg by mouth daily., Disp: , Rfl:  .  Blood Glucose Monitoring Suppl (ACCU-CHEK GUIDE) w/Device KIT, USE AS DIRECTED TO CHECK BLOOD SUGARS 2 TIMES PER DAY DX:E11.65, Disp: 1 kit, Rfl: 1 .  calcium carbonate (TUMS - DOSED IN MG ELEMENTAL CALCIUM) 500 MG chewable tablet, Chew 2 tablets by mouth daily. 2  per day, Disp: , Rfl:  .  Cholecalciferol (VITAMIN D) 2000 UNITS CAPS, Take 1 capsule by mouth daily., Disp: , Rfl:  .  colchicine 0.6 MG tablet, Take 1 tablet (0.6 mg total) by mouth daily., Disp: 30 tablet, Rfl: 2 .  ELIQUIS 5 MG TABS tablet, Take 1 tablet (5 mg total) by mouth 2 (two) times daily., Disp: 180 tablet, Rfl: 0 .  EPINEPHrine (EPIPEN 2-PAK) 0.3 mg/0.3 mL IJ SOAJ injection, Inject 0.3 mLs (0.3 mg total) into the muscle as needed for anaphylaxis., Disp: 2 each, Rfl: 1 .  ferrous sulfate 325 (65 FE) MG EC tablet, Take 325 mg by mouth daily.,  Disp: , Rfl:  .  fexofenadine (ALLEGRA) 180 MG tablet, Take 180 mg by mouth daily., Disp: , Rfl:  .  folic acid (FOLVITE) 1 MG tablet, TAKE ONE TABLET BY MOUTH DAILY, Disp: 90 tablet, Rfl: 10 .  gabapentin (NEURONTIN) 300 MG capsule, Take 1 capsule (300 mg total) by mouth 3 (three) times daily., Disp: 180 capsule, Rfl: 1 .  glucose blood (ACCU-CHEK GUIDE) test strip, Use to check blood sugars twice daily E11.69, Disp: 100 each, Rfl: 2 .  insulin glargine, 1 Unit Dial, (TOUJEO SOLOSTAR) 300 UNIT/ML Solostar Pen, Inject 58 Units into the skin at bedtime. (Patient taking differently: Inject 25 Units into the skin at bedtime.), Disp: 15 pen, Rfl: 1 .  insulin lispro (HUMALOG KWIKPEN) 200 UNIT/ML KwikPen, 6 units with breakfast, 6 units with lunch, and 8 units with dinner (Patient taking differently: 10 units with breakfast, 10 units with lunch, and 14 units with dinner), Disp: 18 pen, Rfl: 1 .  magnesium oxide (MAG-OX) 400 MG tablet, Take 400 mg by mouth daily., Disp: , Rfl:  .  metoprolol succinate (TOPROL-XL) 25 MG 24 hr tablet, Take 1 tablet (25 mg total) by mouth daily., Disp: 90 tablet, Rfl: 1 .  Misc Natural Products (ELDERBERRY ZINC/VIT C/IMMUNE MT), Use as directed 1 capsule in the mouth or throat daily at 12 noon., Disp: , Rfl:  .  Multiple Vitamin (MULTIVITAMIN WITH MINERALS) TABS tablet, Take 1 tablet by mouth daily., Disp: , Rfl:  .  nitroGLYCERIN (NITROSTAT) 0.4 MG SL tablet, Place 1 tablet (0.4 mg total) under the tongue every 5 (five) minutes as needed for chest pain., Disp: 100 tablet, Rfl: 2 .  NOVOFINE PEN NEEDLE 32G X 6 MM MISC, USE 5 TO 8 NEEDLES PER DAY AS DIRECTED PER INSULIN PROTOCOL, Disp: 300 each, Rfl: 9 .  oxyCODONE (OXY IR/ROXICODONE) 5 MG immediate release tablet, Take by mouth., Disp: , Rfl:  .  Pitavastatin Calcium 4 MG TABS, Take 1 tablet (4 mg total) by mouth daily., Disp: 90 tablet, Rfl: 2 .  torsemide (DEMADEX) 20 MG tablet, Take 1 tablet (20 mg total) by mouth daily.,  Disp: 90 tablet, Rfl: 1 .  vitamin E 180 MG (400 UNITS) capsule, Take 400 Units by mouth daily., Disp: , Rfl:  .  allopurinol (ZYLOPRIM) 100 MG tablet, Take 1 tablet (100 mg total) by mouth daily., Disp: 90 tablet, Rfl: 2 .  pantoprazole (PROTONIX) 40 MG tablet, Take 1 tablet (40 mg total) by mouth 2 (two) times daily., Disp: 180 tablet, Rfl: 2   Allergies  Allergen Reactions  . Allspice [Pimenta] Hives and Shortness Of Breath  . Black Cohosh Hives and Shortness Of Breath  . Peach [Prunus Persica] Hives and Shortness Of Breath    Fresh peaches  . Heparin Other (See Comments)    Blood clots  .  Ivp Dye [Iodinated Diagnostic Agents] Other (See Comments)    Reaction unknown  . Tetracyclines & Related Other (See Comments)    Reaction unknown     Review of Systems  Constitutional: Negative.   HENT: Negative.   Eyes: Negative.  Negative for blurred vision.  Respiratory: Negative.  Negative for shortness of breath.   Cardiovascular: Negative.  Negative for chest pain and palpitations.  Endocrine: Negative.   Genitourinary: Negative.   Musculoskeletal: Negative.      Today's Vitals   04/12/20 1049  BP: 138/72  Pulse: 89  Temp: 98 F (36.7 C)  SpO2: 99%  Weight: 246 lb 3.2 oz (111.7 kg)  PainSc: 2    Body mass index is 47.14 kg/m.  Wt Readings from Last 3 Encounters:  04/12/20 246 lb 3.2 oz (111.7 kg)  01/30/20 235 lb (106.6 kg)  06/21/19 247 lb 3.2 oz (112.1 kg)   Objective:  Physical Exam Vitals and nursing note reviewed.  Constitutional:      Appearance: Normal appearance.  HENT:     Head: Normocephalic and atraumatic.     Nose:     Comments: Masked     Mouth/Throat:     Comments: Masked  Cardiovascular:     Rate and Rhythm: Normal rate and regular rhythm.     Heart sounds: Murmur heard.    Pulmonary:     Effort: Pulmonary effort is normal.     Breath sounds: Normal breath sounds.  Musculoskeletal:     Cervical back: Normal range of motion.     Right lower  leg: Edema present.     Left lower leg: Edema present.  Skin:    General: Skin is warm.  Neurological:     General: No focal deficit present.     Mental Status: She is alert.  Psychiatric:        Mood and Affect: Mood normal.        Behavior: Behavior normal.         Assessment And Plan:     1. Type 2 diabetes mellitus with stage 3a chronic kidney disease, with long-term current use of insulin ([place]) Comments: I will check labs as listed below. She also agrees to referral to Nephrology and renal u/s. Importance of dietary compliance was d/w patient. I had a long talk with she and her son regarding continuity of care. Pt advised that is difficult to care for her long-distance. Unfortunately, she is not eligible for a lot of my CCM services because of where she lives. I am also not able to refer her to [facility] for the same reason.  I suggested [facility]; however, there is a fee for this. Her son mentioned that he is followed at [facility]. I will refer her to them for primary care. It has been a pleasure being involved in her care, we have been together for 20 years!  - BMP8+EGFR - CBC no Diff - Protein electrophoresis, serum - Parathyroid Hormone, Intact w/Ca - Phosphorus - POCT UA - Microalbumin - POCT Urinalysis Dipstick (35329) - US Renal; Future - Ambulatory referral to Nephrology - Ambulatory referral to Internal Medicine  2. Parenchymal renal hypertension, stage 1 through stage 4 or unspecified chronic kidney disease Comments: Chronic, fair control. She was advised that optimal BP is less than 130/80. Advised to follow low sodium diet and gradually increase exercise.  She agrees to May virtual visit.  - Ambulatory referral to Internal Medicine  3. Pulmonary hypertension (  [place]) Comments: Chronic, as per Cardiology.   4. Sickle cell trait ([place]) Comments: Stable.   5. Chronic gout due to renal impairment involving toe of left foot without  tophus Comments: I will check uric acid level today. She is encouraged to limit exposure to known triggers and to stay well hydrated.  - Uric acid  6. Class 3 severe obesity due to excess calories with serious comorbidity and body mass index (BMI) of 45.0 to 49.9 in adult [facility]) Comments: She is encouraged to strive to lose 10-15 pounds within the next 4-5 months. Advised to incorporate chair exercises into her daily routine.   7. Need for influenza vaccination Comments: She was given high dose flu vaccine.    Patient was given opportunity to ask questions. Patient verbalized understanding of the plan and was able to repeat key elements of the plan. All questions were answered to their satisfaction.   I, Maximino Greenland, MD, have reviewed all documentation for this visit. The documentation on 05/06/20 for the exam, diagnosis, procedures, and orders are all accurate and complete.   IF YOU HAVE BEEN REFERRED TO A SPECIALIST, IT MAY TAKE 1-2 WEEKS TO SCHEDULE/PROCESS THE REFERRAL. IF YOU HAVE NOT HEARD FROM US/SPECIALIST IN TWO WEEKS, PLEASE GIVE [place] A CALL AT [phone].   THE PATIENT IS ENCOURAGED TO PRACTICE SOCIAL DISTANCING DUE TO THE COVID-19 PANDEMIC.

## 2020-04-12 NOTE — Patient Instructions (Signed)
Diabetes Mellitus and Foot Care Foot care is an important part of your health, especially when you have diabetes. Diabetes may cause you to have problems because of poor blood flow (circulation) to your feet and legs, which can cause your skin to:  Become thinner and drier.  Break more easily.  Heal more slowly.  Peel and crack. You may also have nerve damage (neuropathy) in your legs and feet, causing decreased feeling in them. This means that you may not notice minor injuries to your feet that could lead to more serious problems. Noticing and addressing any potential problems early is the best way to prevent future foot problems. How to care for your feet Foot hygiene  Wash your feet daily with warm water and mild soap. Do not use hot water. Then, pat your feet and the areas between your toes until they are completely dry. Do not soak your feet as this can dry your skin.  Trim your toenails straight across. Do not dig under them or around the cuticle. File the edges of your nails with an emery board or nail file.  Apply a moisturizing lotion or petroleum jelly to the skin on your feet and to dry, brittle toenails. Use lotion that does not contain alcohol and is unscented. Do not apply lotion between your toes.   Shoes and socks  Wear clean socks or stockings every day. Make sure they are not too tight. Do not wear knee-high stockings since they may decrease blood flow to your legs.  Wear shoes that fit properly and have enough cushioning. Always look in your shoes before you put them on to be sure there are no objects inside.  To break in new shoes, wear them for just a few hours a day. This prevents injuries on your feet. Wounds, scrapes, corns, and calluses  Check your feet daily for blisters, cuts, bruises, sores, and redness. If you cannot see the bottom of your feet, use a mirror or ask someone for help.  Do not cut corns or calluses or try to remove them with medicine.  If you  find a minor scrape, cut, or break in the skin on your feet, keep it and the skin around it clean and dry. You may clean these areas with mild soap and water. Do not clean the area with peroxide, alcohol, or iodine.  If you have a wound, scrape, corn, or callus on your foot, look at it several times a day to make sure it is healing and not infected. Check for: ? Redness, swelling, or pain. ? Fluid or blood. ? Warmth. ? Pus or a bad smell.   General tips  Do not cross your legs. This may decrease blood flow to your feet.  Do not use heating pads or hot water bottles on your feet. They may burn your skin. If you have lost feeling in your feet or legs, you may not know this is happening until it is too late.  Protect your feet from hot and cold by wearing shoes, such as at the beach or on hot pavement.  Schedule a complete foot exam at least once a year (annually) or more often if you have foot problems. Report any cuts, sores, or bruises to your health care provider immediately. Where to find more information  American Diabetes Association: www.diabetes.org  Association of Diabetes Care & Education Specialists: www.diabeteseducator.org Contact a health care provider if:  You have a medical condition that increases your risk of infection and   you have any cuts, sores, or bruises on your feet.  You have an injury that is not healing.  You have redness on your legs or feet.  You feel burning or tingling in your legs or feet.  You have pain or cramps in your legs and feet.  Your legs or feet are numb.  Your feet always feel cold.  You have pain around any toenails. Get help right away if:  You have a wound, scrape, corn, or callus on your foot and: ? You have pain, swelling, or redness that gets worse. ? You have fluid or blood coming from the wound, scrape, corn, or callus. ? Your wound, scrape, corn, or callus feels warm to the touch. ? You have pus or a bad smell coming from  the wound, scrape, corn, or callus. ? You have a fever. ? You have a red line going up your leg. Summary  Check your feet every day for blisters, cuts, bruises, sores, and redness.  Apply a moisturizing lotion or petroleum jelly to the skin on your feet and to dry, brittle toenails.  Wear shoes that fit properly and have enough cushioning.  If you have foot problems, report any cuts, sores, or bruises to your health care provider immediately.  Schedule a complete foot exam at least once a year (annually) or more often if you have foot problems. This information is not intended to replace advice given to you by your health care provider. Make sure you discuss any questions you have with your health care provider. Document Revised: 07/28/2019 Document Reviewed: 07/28/2019 Elsevier Patient Education  2021 Elsevier Inc.  

## 2020-04-13 DIAGNOSIS — J9611 Chronic respiratory failure with hypoxia: Secondary | ICD-10-CM | POA: Diagnosis not present

## 2020-04-13 DIAGNOSIS — I130 Hypertensive heart and chronic kidney disease with heart failure and stage 1 through stage 4 chronic kidney disease, or unspecified chronic kidney disease: Secondary | ICD-10-CM | POA: Diagnosis not present

## 2020-04-13 DIAGNOSIS — M109 Gout, unspecified: Secondary | ICD-10-CM | POA: Diagnosis not present

## 2020-04-13 DIAGNOSIS — I5032 Chronic diastolic (congestive) heart failure: Secondary | ICD-10-CM | POA: Diagnosis not present

## 2020-04-13 DIAGNOSIS — R0602 Shortness of breath: Secondary | ICD-10-CM | POA: Diagnosis not present

## 2020-04-13 DIAGNOSIS — R600 Localized edema: Secondary | ICD-10-CM | POA: Diagnosis not present

## 2020-04-13 DIAGNOSIS — I10 Essential (primary) hypertension: Secondary | ICD-10-CM | POA: Diagnosis not present

## 2020-04-13 DIAGNOSIS — Z6841 Body Mass Index (BMI) 40.0 and over, adult: Secondary | ICD-10-CM | POA: Diagnosis not present

## 2020-04-13 DIAGNOSIS — D62 Acute posthemorrhagic anemia: Secondary | ICD-10-CM | POA: Diagnosis not present

## 2020-04-13 DIAGNOSIS — E669 Obesity, unspecified: Secondary | ICD-10-CM | POA: Diagnosis not present

## 2020-04-13 DIAGNOSIS — N1832 Chronic kidney disease, stage 3b: Secondary | ICD-10-CM | POA: Diagnosis not present

## 2020-04-13 DIAGNOSIS — E782 Mixed hyperlipidemia: Secondary | ICD-10-CM | POA: Diagnosis not present

## 2020-04-13 DIAGNOSIS — Z794 Long term (current) use of insulin: Secondary | ICD-10-CM | POA: Diagnosis not present

## 2020-04-13 DIAGNOSIS — Z20822 Contact with and (suspected) exposure to covid-19: Secondary | ICD-10-CM | POA: Diagnosis not present

## 2020-04-13 DIAGNOSIS — D649 Anemia, unspecified: Secondary | ICD-10-CM | POA: Diagnosis not present

## 2020-04-13 DIAGNOSIS — K5521 Angiodysplasia of colon with hemorrhage: Secondary | ICD-10-CM | POA: Diagnosis not present

## 2020-04-13 DIAGNOSIS — R5383 Other fatigue: Secondary | ICD-10-CM | POA: Diagnosis not present

## 2020-04-13 DIAGNOSIS — R918 Other nonspecific abnormal finding of lung field: Secondary | ICD-10-CM | POA: Diagnosis not present

## 2020-04-13 DIAGNOSIS — D509 Iron deficiency anemia, unspecified: Secondary | ICD-10-CM | POA: Diagnosis not present

## 2020-04-13 DIAGNOSIS — K922 Gastrointestinal hemorrhage, unspecified: Secondary | ICD-10-CM | POA: Diagnosis not present

## 2020-04-13 DIAGNOSIS — M5136 Other intervertebral disc degeneration, lumbar region: Secondary | ICD-10-CM | POA: Diagnosis not present

## 2020-04-13 DIAGNOSIS — Z7901 Long term (current) use of anticoagulants: Secondary | ICD-10-CM | POA: Diagnosis not present

## 2020-04-13 DIAGNOSIS — E1122 Type 2 diabetes mellitus with diabetic chronic kidney disease: Secondary | ICD-10-CM | POA: Diagnosis not present

## 2020-04-13 DIAGNOSIS — K5909 Other constipation: Secondary | ICD-10-CM | POA: Diagnosis not present

## 2020-04-14 DIAGNOSIS — M5136 Other intervertebral disc degeneration, lumbar region: Secondary | ICD-10-CM | POA: Diagnosis present

## 2020-04-14 DIAGNOSIS — Z7901 Long term (current) use of anticoagulants: Secondary | ICD-10-CM | POA: Diagnosis not present

## 2020-04-14 DIAGNOSIS — J9611 Chronic respiratory failure with hypoxia: Secondary | ICD-10-CM | POA: Diagnosis present

## 2020-04-14 DIAGNOSIS — E1122 Type 2 diabetes mellitus with diabetic chronic kidney disease: Secondary | ICD-10-CM | POA: Diagnosis present

## 2020-04-14 DIAGNOSIS — K648 Other hemorrhoids: Secondary | ICD-10-CM | POA: Diagnosis present

## 2020-04-14 DIAGNOSIS — D573 Sickle-cell trait: Secondary | ICD-10-CM | POA: Diagnosis present

## 2020-04-14 DIAGNOSIS — K5909 Other constipation: Secondary | ICD-10-CM | POA: Diagnosis present

## 2020-04-14 DIAGNOSIS — K649 Unspecified hemorrhoids: Secondary | ICD-10-CM | POA: Diagnosis not present

## 2020-04-14 DIAGNOSIS — Z86711 Personal history of pulmonary embolism: Secondary | ICD-10-CM | POA: Diagnosis not present

## 2020-04-14 DIAGNOSIS — E785 Hyperlipidemia, unspecified: Secondary | ICD-10-CM | POA: Diagnosis present

## 2020-04-14 DIAGNOSIS — Z9981 Dependence on supplemental oxygen: Secondary | ICD-10-CM | POA: Diagnosis not present

## 2020-04-14 DIAGNOSIS — I509 Heart failure, unspecified: Secondary | ICD-10-CM | POA: Diagnosis not present

## 2020-04-14 DIAGNOSIS — E119 Type 2 diabetes mellitus without complications: Secondary | ICD-10-CM | POA: Diagnosis not present

## 2020-04-14 DIAGNOSIS — I5032 Chronic diastolic (congestive) heart failure: Secondary | ICD-10-CM | POA: Diagnosis present

## 2020-04-14 DIAGNOSIS — K5790 Diverticulosis of intestine, part unspecified, without perforation or abscess without bleeding: Secondary | ICD-10-CM | POA: Diagnosis not present

## 2020-04-14 DIAGNOSIS — I272 Pulmonary hypertension, unspecified: Secondary | ICD-10-CM | POA: Diagnosis not present

## 2020-04-14 DIAGNOSIS — Z9989 Dependence on other enabling machines and devices: Secondary | ICD-10-CM | POA: Diagnosis not present

## 2020-04-14 DIAGNOSIS — I2699 Other pulmonary embolism without acute cor pulmonale: Secondary | ICD-10-CM | POA: Diagnosis not present

## 2020-04-14 DIAGNOSIS — K449 Diaphragmatic hernia without obstruction or gangrene: Secondary | ICD-10-CM | POA: Diagnosis present

## 2020-04-14 DIAGNOSIS — Z20822 Contact with and (suspected) exposure to covid-19: Secondary | ICD-10-CM | POA: Diagnosis present

## 2020-04-14 DIAGNOSIS — N179 Acute kidney failure, unspecified: Secondary | ICD-10-CM | POA: Diagnosis not present

## 2020-04-14 DIAGNOSIS — Z794 Long term (current) use of insulin: Secondary | ICD-10-CM | POA: Diagnosis not present

## 2020-04-14 DIAGNOSIS — D62 Acute posthemorrhagic anemia: Secondary | ICD-10-CM | POA: Diagnosis present

## 2020-04-14 DIAGNOSIS — K5730 Diverticulosis of large intestine without perforation or abscess without bleeding: Secondary | ICD-10-CM | POA: Diagnosis present

## 2020-04-14 DIAGNOSIS — Z6841 Body Mass Index (BMI) 40.0 and over, adult: Secondary | ICD-10-CM | POA: Diagnosis not present

## 2020-04-14 DIAGNOSIS — D649 Anemia, unspecified: Secondary | ICD-10-CM | POA: Diagnosis not present

## 2020-04-14 DIAGNOSIS — N1832 Chronic kidney disease, stage 3b: Secondary | ICD-10-CM | POA: Diagnosis present

## 2020-04-14 DIAGNOSIS — M47816 Spondylosis without myelopathy or radiculopathy, lumbar region: Secondary | ICD-10-CM | POA: Diagnosis present

## 2020-04-14 DIAGNOSIS — G4730 Sleep apnea, unspecified: Secondary | ICD-10-CM | POA: Diagnosis not present

## 2020-04-14 DIAGNOSIS — K5521 Angiodysplasia of colon with hemorrhage: Secondary | ICD-10-CM | POA: Diagnosis present

## 2020-04-14 DIAGNOSIS — D6832 Hemorrhagic disorder due to extrinsic circulating anticoagulants: Secondary | ICD-10-CM | POA: Diagnosis not present

## 2020-04-14 DIAGNOSIS — I130 Hypertensive heart and chronic kidney disease with heart failure and stage 1 through stage 4 chronic kidney disease, or unspecified chronic kidney disease: Secondary | ICD-10-CM | POA: Diagnosis present

## 2020-04-14 DIAGNOSIS — K922 Gastrointestinal hemorrhage, unspecified: Secondary | ICD-10-CM | POA: Diagnosis not present

## 2020-04-14 DIAGNOSIS — K5520 Angiodysplasia of colon without hemorrhage: Secondary | ICD-10-CM | POA: Diagnosis not present

## 2020-04-14 DIAGNOSIS — Z86718 Personal history of other venous thrombosis and embolism: Secondary | ICD-10-CM | POA: Diagnosis not present

## 2020-04-14 DIAGNOSIS — G4733 Obstructive sleep apnea (adult) (pediatric): Secondary | ICD-10-CM | POA: Diagnosis not present

## 2020-04-14 DIAGNOSIS — I2729 Other secondary pulmonary hypertension: Secondary | ICD-10-CM | POA: Diagnosis not present

## 2020-04-14 DIAGNOSIS — I10 Essential (primary) hypertension: Secondary | ICD-10-CM | POA: Diagnosis not present

## 2020-04-14 DIAGNOSIS — F17200 Nicotine dependence, unspecified, uncomplicated: Secondary | ICD-10-CM | POA: Diagnosis not present

## 2020-04-16 LAB — BMP8+EGFR
BUN/Creatinine Ratio: 16 (ref 12–28)
BUN: 26 mg/dL (ref 8–27)
CO2: 21 mmol/L (ref 20–29)
Calcium: 9.6 mg/dL (ref 8.7–10.3)
Chloride: 98 mmol/L (ref 96–106)
Creatinine, Ser: 1.59 mg/dL — ABNORMAL HIGH (ref 0.57–1.00)
Glucose: 103 mg/dL — ABNORMAL HIGH (ref 65–99)
Potassium: 4.5 mmol/L (ref 3.5–5.2)
Sodium: 138 mmol/L (ref 134–144)
eGFR: 35 mL/min/{1.73_m2} — ABNORMAL LOW (ref >59)

## 2020-04-16 LAB — CBC
Hematocrit: 21.6 % — ABNORMAL LOW (ref 34.0–46.6)
Hemoglobin: 5.6 g/dL — CL (ref 11.1–15.9)
MCH: 17 pg — ABNORMAL LOW (ref 26.6–33.0)
MCHC: 25.9 g/dL — ABNORMAL LOW (ref 31.5–35.7)
MCV: 66 fL — ABNORMAL LOW (ref 79–97)
Platelets: 305 10*3/uL (ref 150–450)
RBC: 3.29 x10E6/uL — ABNORMAL LOW (ref 3.77–5.28)
RDW: 17.9 % — ABNORMAL HIGH (ref 11.7–15.4)
WBC: 10.5 10*3/uL (ref 3.4–10.8)

## 2020-04-16 LAB — PHOSPHORUS: Phosphorus: 3.3 mg/dL (ref 3.0–4.3)

## 2020-04-16 LAB — PROTEIN ELECTROPHORESIS, SERUM
A/G Ratio: 0.9 (ref 0.7–1.7)
Albumin ELP: 3.4 g/dL (ref 2.9–4.4)
Alpha 1: 0.3 g/dL (ref 0.0–0.4)
Alpha 2: 0.7 g/dL (ref 0.4–1.0)
Beta: 1.1 g/dL (ref 0.7–1.3)
Gamma Globulin: 1.6 g/dL (ref 0.4–1.8)
Globulin, Total: 3.7 g/dL (ref 2.2–3.9)
Total Protein: 7.1 g/dL (ref 6.0–8.5)

## 2020-04-16 LAB — PTH, INTACT AND CALCIUM: PTH: 81 pg/mL — ABNORMAL HIGH (ref 15–65)

## 2020-04-16 LAB — URIC ACID: Uric Acid: 11.6 mg/dL — ABNORMAL HIGH (ref 3.0–7.2)

## 2020-04-17 ENCOUNTER — Other Ambulatory Visit: Payer: Self-pay

## 2020-04-17 MED ORDER — ALLOPURINOL 100 MG PO TABS: 100.0000 mg | ORAL_TABLET | Freq: Every day | ORAL | 2 refills | Status: DC

## 2020-04-18 ENCOUNTER — Encounter: Payer: Medicare Other | Admitting: Internal Medicine

## 2020-04-18 ENCOUNTER — Ambulatory Visit: Payer: Medicare Other

## 2020-04-20 ENCOUNTER — Other Ambulatory Visit: Payer: Self-pay

## 2020-04-20 MED ORDER — PANTOPRAZOLE SODIUM 40 MG PO TBEC: 40.0000 mg | DELAYED_RELEASE_TABLET | Freq: Two times a day (BID) | ORAL | 2 refills | Status: DC

## 2020-04-23 ENCOUNTER — Telehealth: Payer: Medicare Other

## 2020-04-23 ENCOUNTER — Other Ambulatory Visit: Payer: Self-pay

## 2020-04-23 ENCOUNTER — Telehealth: Payer: Self-pay

## 2020-04-23 NOTE — Telephone Encounter (Signed)
-----   Message from Juliane Poot sent at 04/20/2020 11:30 AM EDT ----- Regarding: RE: Refill Hi Caleen Jobs, can you send over prescription for Pantoprazole [facility]. Thank you ----- Message ----- From: Michelle Nasuti, [place] Sent: 04/17/2020   4:33 PM EDT To: Juliane Poot Subject: RE: Refill                                     Should I send now or wait until after she speaks with Dr. Baird Cancer? ----- Message ----- From: Juliane Poot Sent: 04/17/2020   3:55 PM EDT To: Michelle Nasuti, CMA Subject: Refill                                         Can you also send in a prescription for Pantoprazole with instructions to take bid, Vallie recommends change, she discussing PCP  Thank you [place], [place] Pharmacist Assistant [phone]

## 2020-04-24 ENCOUNTER — Ambulatory Visit (INDEPENDENT_AMBULATORY_CARE_PROVIDER_SITE_OTHER): Payer: Medicare Other

## 2020-04-24 DIAGNOSIS — N1831 Chronic kidney disease, stage 3a: Secondary | ICD-10-CM

## 2020-04-24 DIAGNOSIS — E1122 Type 2 diabetes mellitus with diabetic chronic kidney disease: Secondary | ICD-10-CM

## 2020-04-24 DIAGNOSIS — I272 Pulmonary hypertension, unspecified: Secondary | ICD-10-CM

## 2020-04-24 NOTE — Patient Instructions (Signed)
Social Worker Visit Information  Goals we discussed today:  Goals Addressed            This Visit's Progress   . Work with SW to manage care coordination needs       Timeframe:  Long-Range Goal Priority:  [employer] Start Date:  1.12.22                           Expected End Date:  5.12.22                   Next planned outreach date: 5.6.22  Patient Goals/Self-Care Activities Over the next 30 days, patient will:   - Patient will self administer medications as prescribed Patient will attend all scheduled provider appointments Engage with [facility] as needed prior to next scheduled call        Materials Provided: Verbal education about CCM program provided by phone  Follow Up Plan: SW will follow up with patient by phone over the next month   Daneen Schick, BSW, CDP Social Worker, Certified Dementia Practitioner [place] [facility] [place] [facility] [phone]

## 2020-04-24 NOTE — Chronic Care Management (AMB) (Signed)
Chronic Care Management    Social Work Note  04/24/2020 Name: Anna Clay MRN: [id number] DOB: 17-Jan-1951  Anna Clay is a 70 y.o. year old female who is a primary care patient of Glendale Chard, MD. The CCM team was consulted to assist the patient with chronic disease management and/or care coordination needs related to: [employer] .   Engaged with patient by telephone for follow up visit in response to provider referral for social work chronic care management and care coordination services.   Consent to Services:  The patient was given information about Chronic Care Management services, agreed to services, and gave verbal consent prior to initiation of services.  Please see initial visit note for detailed documentation.   Patient agreed to services and consent obtained.   Assessment: Review of patient past medical history, allergies, medications, and health status, including review of relevant consultants reports was performed today as part of a comprehensive evaluation and provision of chronic care management and care coordination services.     SDOH (Social Determinants of Health) assessments and interventions performed:    Advanced Directives Status: Not addressed in this encounter.  CCM Care Plan  Allergies  Allergen Reactions  . Allspice [Pimenta] Hives and Shortness Of Breath  . Black Cohosh Hives and Shortness Of Breath  . Peach [Prunus Persica] Hives and Shortness Of Breath    Fresh peaches  . Heparin Other (See Comments)    Blood clots  . Ivp Dye [Iodinated Diagnostic Agents] Other (See Comments)    Reaction unknown  . Tetracyclines & Related Other (See Comments)    Reaction unknown    Outpatient Encounter Medications as of 04/24/2020  Medication Sig  . Accu-Chek FastClix Lancets MISC USE AS DIRECTED TO CHECK BLOOD SUGARS 2 TIMES PER DAY DX:E11.65  . Accu-Chek Softclix Lancets lancets Use to check blood sugars twice daily E11.69  . acetaminophen (TYLENOL)  650 MG CR tablet Take 1,300 mg by mouth every 8 (eight) hours as needed for pain.  Marland Kitchen allopurinol (ZYLOPRIM) 100 MG tablet Take 1 tablet (100 mg total) by mouth daily.  Marland Kitchen aspirin EC 81 MG tablet Take 81 mg by mouth daily.  . Blood Glucose Monitoring Suppl (ACCU-CHEK GUIDE) w/Device KIT USE AS DIRECTED TO CHECK BLOOD SUGARS 2 TIMES PER DAY DX:E11.65  . calcium carbonate (TUMS - DOSED IN MG ELEMENTAL CALCIUM) 500 MG chewable tablet Chew 2 tablets by mouth daily. 2 per day  . Cholecalciferol (VITAMIN D) 2000 UNITS CAPS Take 1 capsule by mouth daily.  . colchicine 0.6 MG tablet Take 1 tablet (0.6 mg total) by mouth daily.  Marland Kitchen ELIQUIS 5 MG TABS tablet Take 1 tablet (5 mg total) by mouth 2 (two) times daily.  Marland Kitchen EPINEPHrine (EPIPEN 2-PAK) 0.3 mg/0.3 mL IJ SOAJ injection Inject 0.3 mLs (0.3 mg total) into the muscle as needed for anaphylaxis.  . ferrous sulfate 325 (65 FE) MG EC tablet Take 325 mg by mouth daily.  . fexofenadine (ALLEGRA) 180 MG tablet Take 180 mg by mouth daily.  . folic acid (FOLVITE) 1 MG tablet TAKE ONE TABLET BY MOUTH DAILY  . gabapentin (NEURONTIN) 300 MG capsule Take 1 capsule (300 mg total) by mouth 3 (three) times daily.  Marland Kitchen glucose blood (ACCU-CHEK GUIDE) test strip Use to check blood sugars twice daily E11.69  . insulin glargine, 1 Unit Dial, (TOUJEO SOLOSTAR) 300 UNIT/ML Solostar Pen Inject 58 Units into the skin at bedtime. (Patient taking differently: Inject 25 Units into the skin at  bedtime.)  . insulin lispro (HUMALOG KWIKPEN) 200 UNIT/ML KwikPen 6 units with breakfast, 6 units with lunch, and 8 units with dinner (Patient taking differently: 10 units with breakfast, 10 units with lunch, and 14 units with dinner)  . magnesium oxide (MAG-OX) 400 MG tablet Take 400 mg by mouth daily.  . metoprolol succinate (TOPROL-XL) 25 MG 24 hr tablet Take 1 tablet (25 mg total) by mouth daily.  . Misc Natural Products (ELDERBERRY ZINC/VIT C/IMMUNE MT) Use as directed 1 capsule in the mouth or  throat daily at 12 noon.  . Multiple Vitamin (MULTIVITAMIN WITH MINERALS) TABS tablet Take 1 tablet by mouth daily.  . nitroGLYCERIN (NITROSTAT) 0.4 MG SL tablet Place 1 tablet (0.4 mg total) under the tongue every 5 (five) minutes as needed for chest pain.  Marland Kitchen NOVOFINE PEN NEEDLE 32G X 6 MM MISC USE 5 TO 8 NEEDLES PER DAY AS DIRECTED PER INSULIN PROTOCOL  . oxyCODONE (OXY IR/ROXICODONE) 5 MG immediate release tablet Take by mouth.  . pantoprazole (PROTONIX) 40 MG tablet Take 1 tablet (40 mg total) by mouth 2 (two) times daily.  . Pitavastatin Calcium 4 MG TABS Take 1 tablet (4 mg total) by mouth daily.  Marland Kitchen torsemide (DEMADEX) 20 MG tablet Take 1 tablet (20 mg total) by mouth daily.  . vitamin E 180 MG (400 UNITS) capsule Take 400 Units by mouth daily.   No facility-administered encounter medications on file as of 04/24/2020.    Patient Active Problem List   Diagnosis Date Noted  . Sickle cell trait ([place]) 08/18/2018  . Iron deficiency anemia 10/24/2016  . Allergic rhinitis 06/07/2014  . OSA (obstructive sleep apnea) 10/05/2013  . Diabetes mellitus ([place]) 10/05/2013  . Anemia 10/05/2013  . Pulmonary hypertension ([place]) 10/04/2013  . Acute right-sided CHF (congestive heart failure) ([place]) 10/04/2013    Conditions to be addressed/monitored: HTN and DMII; Transportation  Care Plan : Social Work  Updates made by Daneen Schick since 04/24/2020 12:00 AM    Problem: Care Coordination     Long-Range Goal: Collaborate with RN Care Manager to perform appropriate assessments to assist with care coordination needs   Start Date: 02/01/2020  Expected End Date: 05/31/2020  Recent Progress: On track  Priority: Medium  Note:   Current Barriers:  . Limited access to food . ADL IADL limitations . Chronic conditions including DM II and Pulmonary Hypertension which place patient at increased risk of hospitalization  Social Work Clinical Goal(s):  Marland Kitchen Over the next 120 days, patient will work with SW to  address concerns related to care coordination  Interventions: . 1:1 collaboration with Glendale Chard, MD regarding development and update of comprehensive plan of care as evidenced by provider attestation and co-signature . Inter-disciplinary care team collaboration (see longitudinal plan of care) . Successful outbound call placed to assist with care coordination needs . Performed chart review to note patient seen in practice by primary provider on 3.24.22 - patient reports during this visit she and her provider discussed a referral to [facility] due to transportation barriers . Discussed the patient was admitted on 3.25.22 due to Anemia and Gastrointestinal Hemorrhage - patient reports she underwent a colonoscopy and endoscopy. 3 Polyps were removed and patient received 2 blood transfusions . Determined the patient is feeling better since returning home but notes she is still somewhat weak . Discussed the patient is able to prepare meals and does have some frozen meals from [employer] to assist with nutritional needs . Advised  the patient SW would collaborate with Dr. Baird Cancer to determine status of referral to [facility] . Patient is aware that once she transitions to [facility] she will no longer be active with CCM program . Collaboration with Dr. Baird Cancer to request an update on referral status . Scheduled follow up call over the next month to confirm patient is actively engaged with [facility]  Patient Goals/Self-Care Activities Over the next 30 days, patient will:   - Patient will self administer medications as prescribed Patient will attend all scheduled provider appointments Engage with [facility] as needed prior to next scheduled call  Follow up Plan: SW will follow up with patient by phone over the next month       Follow Up Plan: SW will follow up with patient by phone over the next  month.      Daneen Schick, BSW, CDP Social Worker, Certified Dementia Practitioner [place] [facility] [place] [facility] [phone]  Total time spent performing care coordination and/or care management activities with the patient by phone or face to face = 24 minutes.

## 2020-05-09 ENCOUNTER — Telehealth: Payer: Self-pay

## 2020-05-09 NOTE — Chronic Care Management (AMB) (Signed)
Called and spoke with the patient reminding her of upcoming CCM Call appointment on 05/10/20 at 11:00AM with Orlando Penner, CPP. The patient was made aware to have medications and supplements nearby during phone call with CPP, Orlando Penner. Patient verbalized understanding.   Lizbeth Bark Clinical Pharmacist Assistant [phone]

## 2020-05-10 ENCOUNTER — Ambulatory Visit: Payer: Medicare Other

## 2020-05-10 DIAGNOSIS — N1831 Chronic kidney disease, stage 3a: Secondary | ICD-10-CM

## 2020-05-10 DIAGNOSIS — Z794 Long term (current) use of insulin: Secondary | ICD-10-CM | POA: Diagnosis not present

## 2020-05-10 DIAGNOSIS — E782 Mixed hyperlipidemia: Secondary | ICD-10-CM

## 2020-05-10 DIAGNOSIS — E1122 Type 2 diabetes mellitus with diabetic chronic kidney disease: Secondary | ICD-10-CM

## 2020-05-10 NOTE — Progress Notes (Signed)
Chronic Care Management Pharmacy Note  05/17/2020 Name:  ROONEY GLADWIN MRN:  [id number] DOB:  1950/10/22  Subjective: Anna Clay is an 70 y.o. year old female who is a primary patient of Glendale Chard, MD.  The CCM team was consulted for assistance with disease management and care coordination needs.  Patient uses C-PAP at night, she spoke with her niece in [place], [place]. She worked at [employer] and she worked at [employer] until she retired. She worked in [employer]. Working there for 20 years. She reports the job was hard on the body. She was in rehab last year and reported that . She reports that she does not want lose Dr. Baird Cancer as her doctor, but she understands that she needs to have doctor closer to home. She wakes up between 8:30-9:30 am and begins her day. She lives with her son and daughter in law, and they cook dinner for her. She has been living with them for the past three years. They did not like the idea of living so far away so she moved to [facility], [place]. She has been a single mother since her son was [age] years old.   Engaged with patient by telephone for follow up visit in response to provider referral for pharmacy case management and/or care coordination services.   Consent to Services:  The patient was given information about Chronic Care Management services, agreed to services, and gave verbal consent prior to initiation of services.  Please see initial visit note for detailed documentation.   Patient Care Team: Glendale Chard, MD as PCP - General (Internal Medicine) Lynne [place], RN as Case Manager [place], Kennieth Francois, [facility] (Inactive) (Pharmacist) Daneen Schick as Social Worker  Recent office visits:  04/12/2020 PCP [facility] visits:  04/13/2020: Yes- symptomatic Anemia   Objective:  Lab Results  Component Value Date   CREATININE 1.59 (H) 04/12/2020   BUN 26 04/12/2020   GFRNONAA 40 01/31/2020   GFRAA 46 01/31/2020    NA 138 04/12/2020   K 4.5 04/12/2020   CALCIUM 9.6 04/12/2020   CO2 21 04/12/2020   GLUCOSE 103 (H) 04/12/2020    Lab Results  Component Value Date/Time   HGBA1C 7.0 01/31/2020 12:00 AM   HGBA1C 6.9 (H) 04/06/2019 05:07 PM   HGBA1C 7.0 (H) 12/29/2018 04:49 PM   FRUCTOSAMINE 277 08/18/2018 10:33 AM   MICROALBUR 180m 04/12/2020 04:09 PM   MICROALBUR 150 04/06/2019 04:18 PM    Last diabetic Eye exam: No results found for: HMDIABEYEEXA  Last diabetic Foot exam: No results found for: HMDIABFOOTEX   Lab Results  Component Value Date   CHOL 180 01/31/2020   HDL 45 01/31/2020   LDLCALC 111 01/31/2020   TRIG 135 01/31/2020   CHOLHDL 3.9 04/06/2019    Hepatic Function Latest Ref Rng & Units 04/12/2020 01/31/2020 04/06/2019  Total Protein 6.0 - 8.5 g/dL 7.1 - 7.3  Albumin 3.5 - 5.0 - 4.0 3.8  AST 13 - 35 - 15 18  ALT 7 - 35 - 20 16  Alk Phosphatase 25 - 125 - 110 110  Total Bilirubin 0.0 - 1.2 mg/dL - - 0.2    Lab Results  Component Value Date/Time   TSH 3.490 10/06/2013 03:18 AM    CBC Latest Ref Rng & Units 04/12/2020 12/29/2018 12/05/2016  WBC 3.4 - 10.8 x10E3/uL 10.5 10.7 8.7  Hemoglobin 11.1 - 15.9 g/dL 5.6(LL) 13.5 12.5  Hematocrit 34.0 - 46.6 % 21.6(L)  46.5 39.3  Platelets 150 - 450 x10E3/uL 305 289 211    No results found for: VD25OH  Clinical ASCVD: No  The 10-year ASCVD risk score Mikey Bussing DC Jr., et al., 2013) is: 17.4%*   Values used to calculate the score:     Age: 76 years     Sex: Female     Is Non-Hispanic African American: Yes     Diabetic: Yes     Tobacco smoker: No     Systolic Blood Pressure: 256 mmHg     Is BP treated: Yes     HDL Cholesterol: 45 mg/dL*     Total Cholesterol: 180 mg/dL*     * - Cholesterol units were assumed for this score calculation    Depression screen [facility] 2/9 04/12/2020 04/06/2019 05/25/2018  Decreased Interest 0 0 0  Down, Depressed, Hopeless 0 0 0  PHQ - 2 Score 0 0 0  Altered sleeping - 0 -  Tired, decreased energy - 0 -   Change in appetite - 0 -  Feeling bad or failure about yourself  - 0 -  Trouble concentrating - 0 -  Moving slowly or fidgety/restless - 0 -  Suicidal thoughts - 0 -  PHQ-9 Score - 0 -  Difficult doing work/chores - Not difficult at all -     Social History   Tobacco Use  Smoking Status Former Smoker  . Packs/day: 1.00  . Years: 43.00  . Pack years: 43.00  . Types: Cigarettes  . Quit date: 09/20/2013  . Years since quitting: 6.6  Smokeless Tobacco Never Used   BP Readings from Last 3 Encounters:  04/12/20 138/72  01/30/20 (!) 165/100  06/21/19 (!) 146/88   Pulse Readings from Last 3 Encounters:  04/12/20 89  01/30/20 96  06/21/19 94   Wt Readings from Last 3 Encounters:  04/12/20 246 lb 3.2 oz (111.7 kg)  01/30/20 235 lb (106.6 kg)  06/21/19 247 lb 3.2 oz (112.1 kg)   BMI Readings from Last 3 Encounters:  04/12/20 47.14 kg/m  01/30/20 44.99 kg/m  06/21/19 47.33 kg/m    Assessment/Interventions: Review of patient past medical history, allergies, medications, health status, including review of consultants reports, laboratory and other test data, was performed as part of comprehensive evaluation and provision of chronic care management services.   SDOH:  (Social Determinants of Health) assessments and interventions performed: Yes  SDOH Screenings   Alcohol Screen: Not on file  Depression (PHQ2-9): Low Risk   . PHQ-2 Score: 0  Financial Resource Strain: Low Risk   . Difficulty of Paying Living Expenses: Not hard at all  Food Insecurity: Not on file  Housing: Not on file  Physical Activity: Not on file  Social Connections: Not on file  Stress: Not on file  Tobacco Use: Medium Risk  . Smoking Tobacco Use: Former Smoker  . Smokeless Tobacco Use: Never Used  Transportation Needs: Not on file    CCM Care Plan  Allergies  Allergen Reactions  . Allspice [Pimenta] Hives and Shortness Of Breath  . Black Cohosh Hives and Shortness Of Breath  . Peach [Prunus  Persica] Hives and Shortness Of Breath    Fresh peaches  . Heparin Other (See Comments)    Blood clots  . Ivp Dye [Iodinated Diagnostic Agents] Other (See Comments)    Reaction unknown  . Tetracyclines & Related Other (See Comments)    Reaction unknown    Medications Reviewed Today    Reviewed by Orlando Penner  J, RPH (Pharmacist) on 05/10/20 at 1141  Med List Status: <None>  Medication Order Taking? Sig Documenting Provider Last Dose Status Informant  Accu-Chek FastClix Lancets MISC [id number] Yes USE AS DIRECTED TO CHECK BLOOD SUGARS 2 TIMES PER DAY DX:E11.65 Glendale Chard, MD Taking Active   Accu-Chek Softclix Lancets lancets [id number] Yes Use to check blood sugars twice daily E11.69 Glendale Chard, MD Taking Active   acetaminophen (TYLENOL) 650 MG CR tablet [id number] Yes Take 1,300 mg by mouth every 8 (eight) hours as needed for pain. [provider] Taking Active Self  allopurinol (ZYLOPRIM) 100 MG tablet [id number]  Take 1 tablet (100 mg total) by mouth daily. Glendale Chard, MD  Active   aspirin EC 81 MG tablet [id number] No Take 81 mg by mouth daily.  Patient not taking: Reported on 05/10/2020   [provider] Not Taking Active Self           Med Note Pricilla Holm May 10, 2020 11:40 AM) Discontinued from the last hospital visit.   Blood Glucose Monitoring Suppl (ACCU-CHEK GUIDE) w/Device KIT [id number] Yes USE AS DIRECTED TO CHECK BLOOD SUGARS 2 TIMES PER DAY DX:E11.65 Glendale Chard, MD Taking Active   calcium carbonate (TUMS - DOSED IN MG ELEMENTAL CALCIUM) 500 MG chewable tablet [id number] Yes Chew 2 tablets by mouth daily. 2 per day [provider] Taking Active Self  Cholecalciferol (VITAMIN D) 2000 UNITS CAPS [id number] Yes Take 1 capsule by mouth daily. [provider] Taking Active Self  colchicine 0.6 MG tablet [id number] Yes Take 1 tablet (0.6 mg total) by mouth daily. Glendale Chard, MD Taking Active   ELIQUIS 5 MG TABS tablet  [id number] Yes Take 1 tablet (5 mg total) by mouth 2 (two) times daily. Glendale Chard, MD Taking Active   EPINEPHrine (EPIPEN 2-PAK) 0.3 mg/0.3 mL IJ SOAJ injection [id number] Yes Inject 0.3 mLs (0.3 mg total) into the muscle as needed for anaphylaxis. Glendale Chard, MD Taking Active   ferrous sulfate 325 (65 FE) MG EC tablet [id number] Yes Take 325 mg by mouth daily. [provider] Taking Active Self  fexofenadine (ALLEGRA) 180 MG tablet [id number] Yes Take 180 mg by mouth daily. [provider] Taking Active   folic acid (FOLVITE) 1 MG tablet [id number] Yes TAKE ONE TABLET BY MOUTH DAILY [place], Holli Humbles, NP Taking Active   gabapentin (NEURONTIN) 300 MG capsule [id number] Yes Take 1 capsule (300 mg total) by mouth 3 (three) times daily. Glendale Chard, MD Taking Active   glucose blood (ACCU-CHEK GUIDE) test strip [id number] Yes Use to check blood sugars twice daily E11.69 Glendale Chard, MD Taking Active   insulin glargine, 1 Unit Dial, (TOUJEO SOLOSTAR) 300 UNIT/ML Solostar Pen [id number] Yes Inject 58 Units into the skin at bedtime.  Patient taking differently: Inject 25 Units into the skin at bedtime.   Glendale Chard, MD Taking Active   insulin lispro (HUMALOG KWIKPEN) 200 UNIT/ML KwikPen [id number] Yes 6 units with breakfast, 6 units with lunch, and 8 units with dinner  Patient taking differently: 10 units with breakfast, 10 units with lunch, and 14 units with dinner   Glendale Chard, MD Taking Active   magnesium oxide (MAG-OX) 400 MG tablet [id number] Yes Take 400 mg by mouth daily. [provider] Taking Active   metoprolol succinate (TOPROL-XL) 25 MG 24 hr tablet [id number] Yes Take 1 tablet (25 mg total) by mouth daily. Glendale Chard, MD Taking Active   Misc Natural Products [facility] ZINC/VIT C/IMMUNE MT)  [id number] Yes Use as directed 1 capsule in the mouth or throat daily at 12 noon. [provider] Taking Active   Multiple Vitamin (MULTIVITAMIN WITH  MINERALS) TABS tablet [id number] Yes Take 1 tablet by mouth daily. [provider] Taking Active Self  nitroGLYCERIN (NITROSTAT) 0.4 MG SL tablet [id number] Yes Place 1 tablet (0.4 mg total) under the tongue every 5 (five) minutes as needed for chest pain. Glendale Chard, MD Taking Active   NOVOFINE PEN NEEDLE 32G X 6 MM MISC [id number] Yes USE 5 TO 8 NEEDLES PER DAY AS DIRECTED PER INSULIN PROTOCOL Minette Brine, FNP Taking Active   oxyCODONE (OXY IR/ROXICODONE) 5 MG immediate release tablet [id number] Yes Take by mouth. [provider] Taking Active   pantoprazole (PROTONIX) 40 MG tablet [id number] Yes Take 1 tablet (40 mg total) by mouth 2 (two) times daily. Glendale Chard, MD Taking Active   Pitavastatin Calcium 4 MG TABS [id number] Yes Take 1 tablet (4 mg total) by mouth daily. Glendale Chard, MD Taking Active   torsemide [facility]) 20 MG tablet [id number] Yes Take 1 tablet (20 mg total) by mouth daily. Glendale Chard, MD Taking Active   vitamin E 180 MG (400 UNITS) capsule [id number] Yes Take 400 Units by mouth daily. [provider] Taking Active           Patient Active Problem List   Diagnosis Date Noted  . Sickle cell trait ([place]) 08/18/2018  . Iron deficiency anemia 10/24/2016  . Allergic rhinitis 06/07/2014  . OSA (obstructive sleep apnea) 10/05/2013  . Diabetes mellitus ([place]) 10/05/2013  . Anemia 10/05/2013  . Pulmonary hypertension ([place]) 10/04/2013  . Acute right-sided CHF (congestive heart failure) ([place]) 10/04/2013    Immunization History  Administered Date(s) Administered  . Influenza, High Dose Seasonal PF 01/27/2018  . Influenza,inj,Quad PF,6+ Mos 10/06/2013, 11/06/2018, 04/12/2020  . Moderna Sars-Covid-2 Vaccination 06/22/2019, 09/02/2019  . Pneumococcal Conjugate-13 01/27/2018  . Pneumococcal Polysaccharide-23 10/06/2013, 04/06/2019  . Tdap 11/06/2018    Conditions to be addressed/monitored:  Hypertension and Diabetes  Care Plan : [place]  Updates made by Mayford Knife, RPH since 05/17/2020 12:00 AM    Problem: HTN, DM II   Priority: High    Long-Range Goal: Disease Management   This Visit's Progress: On track  Priority: High  Note:   Current Barriers:  . Does not adhere to prescribed medication regimen  Pharmacist Clinical Goal(s):  Marland Kitchen Patient will achieve adherence to monitoring guidelines and medication adherence to achieve therapeutic efficacy through collaboration with PharmD and provider.   Interventions: . 1:1 collaboration with Glendale Chard, MD regarding development and update of comprehensive plan of care as evidenced by provider attestation and co-signature . Inter-disciplinary care team collaboration (see longitudinal plan of care) . Comprehensive medication review performed; medication list updated in electronic medical record  Hyperlipidemia: (LDL goal < 70) -Uncontrolled -Current treatment: . Pitavastatin 4 mg tablet once daily  . Aspirin 81 mg tablet daily  -Current dietary patterns: is getting take out often, but trying to limit fried and fatty foods. She reports that some vegetables she does not enjoy. She enjoys green beans, fresh tomatoes, and okra.  -Current exercise habits: please see diabetes.  -Encouraged patient to eat more vegetables -Educated on Cholesterol goals;  Benefits of statin for ASCVD risk reduction; Importance of limiting foods high in cholesterol; Exercise goal of 150 minutes per week; -Recommended to continue current medication  Diabetes (A1c goal <7%) -Not ideally controlled -Current medications: . Insulin  Glargine 300 unit/ml - inject 58 units into the skin at bedtime  . Insulin Lispro 200 unit/ml - 6 units with breakfast, 6 units with lunch and 8 units with dinner.  -Current home glucose readings- patient reports that she checked her BS 4 times this morning. She checked it with her freestyle monitor and her finger stick   . fasting glucose: 121   . post prandial glucose: <200  -Denies hypoglycemic/hyperglycemic symptoms -Current meal patterns:  . Breakfast: cereal rice krispies and cup of coffee-coffee mate regular creamer lactose free whole milk about a cup . lunch: eats lunch after 3pm, a bowl of soup, left overs from the night before  . dinner: depends on what her son and daughter in law cook  . drinks: water, occasionally will drink a pepsi twice a month  -Current exercise: she is currently exercising using the list from the physical  therapist that did home health.  -Educated on A1c and blood sugar goals; Complications of diabetes including kidney damage, retinal damage, and cardiovascular disease; Benefits of weight loss; -Recommended patient do YouTube Silver Sneakers program, patient was able to access the site.  -Counseled to check feet daily and get yearly eye exams -Recommended to continue current medication   Patient Goals/Self-Care Activities . Patient will:  - take medications as prescribed  Follow Up Plan: The patient has been provided with contact information for the care management team and has been advised to call with any health related questions or concerns.       Medication Assistance: None required.  Patient affirms current coverage meets needs.  Patient's preferred pharmacy is:  [facility] - [place], [place] - [address] [place] [address] Phone: [phone] Fax: [phone]  [facility] at [place], [place] [place] [address] [place] [address] Phone: [phone] Fax: [phone]  Uses pill box? Yes Pt endorses 100% compliance  We discussed: Benefits of medication synchronization, packaging and delivery as well as enhanced pharmacist oversight with [facility]. Patient decided to: Utilize [facility] pharmacy for medication synchronization, packaging and delivery  Care Plan and Follow Up  Patient Decision:  Patient agrees to Care Plan and Follow-up.  Plan: The patient has been provided with contact information for the care management team and has been advised to call with any health related questions or concerns.   Orlando Penner, PharmD Clinical Pharmacist [facility] [phone]

## 2020-05-17 NOTE — Patient Instructions (Signed)
Visit Information It was great speaking with you today!  Please let me know if you have any questions about our visit.  Goals Addressed            This Visit's Progress   . Manage My Medicine       Timeframe:  Long-Range Goal Priority:  High Start Date:                             Expected End Date:                       Follow Up Date 05/22/2020   - call for medicine refill 2 or 3 days before it runs out - keep a list of all the medicines I take; vitamins and herbals too - learn to read medicine labels - use a pillbox to sort medicine    Why is this important?   . These steps will help you keep on track with your medicines.          Patient Care Plan: Social Work    Problem Identified: Care Coordination     Goal: Food Insecurity Completed 02/01/2020  Start Date: 12/13/2019  Priority: High  Note:   Current Barriers:  . Limited social support . Limited access to food . Social Isolation . Inability to perform IADL's independently . Chronic conditions including DM II with Stage III CKD and Pulmonary HTN which put patient at increased risk for hospitalization  Social Work Clinical Goal(s):  Marland Kitchen Over the next 30 days, patient will work with SW to address concerns related to meal preparation  CCM SW Interventions: . Successful outbound call placed to the patient to confirm receipt of meals . Goal met Patient Goals/Self-Care Activities Over the next 30 days, patient will:   - Contact SW as needed prior to next scheduled call . Take Medication as prescribed . Contact provider office as needed . Follow up with [facility] as needed [employer] on [address] Completed . Consume diet specific meals delivered by [employer] to address food insecurity   Follow up Plan: SW will follow up with the patient over the next week    Long-Range Goal: Collaborate with RN Care Manager to perform appropriate assessments to assist with care coordination  needs   Start Date: 02/01/2020  Expected End Date: 05/31/2020  Recent Progress: On track  Priority: Medium  Note:   Current Barriers:  . Limited access to food . ADL IADL limitations . Chronic conditions including DM II and Pulmonary Hypertension which place patient at increased risk of hospitalization  Social Work Clinical Goal(s):  Marland Kitchen Over the next 120 days, patient will work with SW to address concerns related to care coordination  Interventions: . 1:1 collaboration with Glendale Chard, MD regarding development and update of comprehensive plan of care as evidenced by provider attestation and co-signature . Inter-disciplinary care team collaboration (see longitudinal plan of care) . Successful outbound call placed to assist with care coordination needs . Performed chart review to note patient seen in practice by primary provider on 3.24.22 - patient reports during this visit she and her provider discussed a referral to [employer] due to transportation barriers . Discussed the patient was admitted on 3.25.22 due to Anemia and Gastrointestinal Hemorrhage - patient reports she underwent a colonoscopy and endoscopy. 3 Polyps were removed and patient received 2 blood transfusions . Determined the  patient is feeling better since returning home but notes she is still somewhat weak . Discussed the patient is able to prepare meals and does have some frozen meals from [employer] to assist with nutritional needs . Advised the patient SW would collaborate with Dr. Baird Cancer to determine status of referral to [facility] . Patient is aware that once she transitions to [facility] she will no longer be active with CCM program . Collaboration with Dr. Baird Cancer to request an update on referral status . Scheduled follow up call over the next month to confirm patient is actively engaged with [place]  Patient Goals/Self-Care Activities Over the  next 30 days, patient will:   - Patient will self administer medications as prescribed Patient will attend all scheduled provider appointments Engage with [place] as needed prior to next scheduled call  Follow up Plan: SW will follow up with patient by phone over the next month    Patient Care Plan: Diabetes Type 2 (Adult)    Problem Identified: Glycemic Management (Diabetes, Type 2)   Priority: High    Long-Range Goal: Glycemic Management Optimized   Start Date: 02/27/2020  Expected End Date: 08/27/2020  This Visit's Progress: On track  Priority: High  Note:   Current Barriers:  Marland Kitchen Knowledge Deficits related to Self health management of Diabetes . Chronic Disease Management support and education needs related to DM II, Pulmonary Hypertension, OSA  Nurse Case Manager Clinical Goal(s):  Marland Kitchen Over the next 180 days, patient will continue to engage with the CCM team and PCP for ongoing support and disease education to help improve her Self Health management of her DM as evidence patient will lower her A1c <7.0 CCM RN CM Interventions:  02/27/20 Successful call completed with patient  . Evaluation of current treatment plan related to Diabetes and patient's adherence to plan as established by provider . Reviewed patient's current A1c of 7.0; Educated on target A1c, daily glycemic control FBS 80-130, <180 after meals; Educated on 15'15' rule . Determined patient is Self monitoring her blood sugars at home, with average in the low 100's . Educated patient on dietary and exercise recommendations . Educated with rationale importance of ongoing CBG monitoring daily before meals and at bedtime, and to notify the CCM team and or PCP for abnormal results . Discussed next PCP follow up visit with Dr. Baird Cancer scheduled for 04/12/20 @10 :30 AM . Discussed plans with patient for ongoing care management follow up and provided patient with direct contact information for care  management team Patient Self Care Activities:  . Continue to monitor blood sugars and record as discussed . Continue to follow dietary and exercise recommendations . Patient will self administer medications as prescribed . Patient will attend all scheduled provider appointments . Patient will call pharmacy for medication refills . Patient will call provider office for new concerns or questions Next Follow up date: 04/23/20    Patient Care Plan: CCM Pharmacy Care Plan    Problem Identified: HTN, DM II   Priority: High    Long-Range Goal: Disease Management   This Visit's Progress: On track  Priority: High  Note:   Current Barriers:  . Does not adhere to prescribed medication regimen  Pharmacist Clinical Goal(s):  Marland Kitchen Patient will achieve adherence to monitoring guidelines and medication adherence to achieve therapeutic efficacy through collaboration with PharmD and provider.   Interventions: . 1:1 collaboration with Glendale Chard, MD regarding development  and update of comprehensive plan of care as evidenced by provider attestation and co-signature . Inter-disciplinary care team collaboration (see longitudinal plan of care) . Comprehensive medication review performed; medication list updated in electronic medical record  Hyperlipidemia: (LDL goal < 70) -Uncontrolled -Current treatment: . Pitavastatin 4 mg tablet once daily  . Aspirin 81 mg tablet daily  -Current dietary patterns: is getting take out often, but trying to limit fried and fatty foods. She reports that some vegetables she does not enjoy. She enjoys green beans, fresh tomatoes, and okra.  -Current exercise habits: please see diabetes.  -Encouraged patient to eat more vegetables -Educated on Cholesterol goals;  Benefits of statin for ASCVD risk reduction; Importance of limiting foods high in cholesterol; Exercise goal of 150 minutes per week; -Recommended to continue current medication  Diabetes (A1c goal  <7%) -Not ideally controlled -Current medications: . Insulin Glargine 300 unit/ml - inject 58 units into the skin at bedtime  . Insulin Lispro 200 unit/ml - 6 units with breakfast, 6 units with lunch and 8 units with dinner.  -Current home glucose readings- patient reports that she checked her BS 4 times this morning. She checked it with her freestyle monitor and her finger stick   . fasting glucose: 121  . post prandial glucose: <200  -Denies hypoglycemic/hyperglycemic symptoms -Current meal patterns:  . Breakfast: cereal rice krispies and cup of coffee-coffee mate regular creamer lactose free whole milk about a cup . lunch: eats lunch after 3pm, a bowl of soup, left overs from the night before  . dinner: depends on what her son and daughter in law cook  . drinks: water, occasionally will drink a pepsi twice a month  -Current exercise: she is currently exercising using the list from the physical  therapist that did home health.  -Educated on A1c and blood sugar goals; Complications of diabetes including kidney damage, retinal damage, and cardiovascular disease; Benefits of weight loss; -Recommended patient do [employer] Silver Sneakers program, patient was able to access the site.  -Counseled to check feet daily and get yearly eye exams -Recommended to continue current medication   Patient Goals/Self-Care Activities . Patient will:  - take medications as prescribed  Follow Up Plan: The patient has been provided with contact information for the care management team and has been advised to call with any health related questions or concerns.       Patient agreed to services and verbal consent obtained.   The patient verbalized understanding of instructions, educational materials, and care plan provided today and agreed to receive a mailed copy of patient instructions, educational materials, and care plan.   Orlando Penner, PharmD Clinical Pharmacist [facility] [phone]

## 2020-05-21 ENCOUNTER — Telehealth: Payer: Self-pay

## 2020-05-21 DIAGNOSIS — K922 Gastrointestinal hemorrhage, unspecified: Secondary | ICD-10-CM | POA: Diagnosis not present

## 2020-05-21 DIAGNOSIS — K5521 Angiodysplasia of colon with hemorrhage: Secondary | ICD-10-CM | POA: Diagnosis not present

## 2020-05-21 DIAGNOSIS — Z7901 Long term (current) use of anticoagulants: Secondary | ICD-10-CM | POA: Diagnosis not present

## 2020-05-21 DIAGNOSIS — Z86718 Personal history of other venous thrombosis and embolism: Secondary | ICD-10-CM | POA: Diagnosis not present

## 2020-05-21 NOTE — Chronic Care Management (AMB) (Signed)
Left patient a voicemail with appointment reminder with Orlando Penner CPP on 05-22-2020 at 11:30am and to have all medications/supplements and logs documented near for review.   [place]  [phone]

## 2020-05-22 ENCOUNTER — Ambulatory Visit (INDEPENDENT_AMBULATORY_CARE_PROVIDER_SITE_OTHER): Payer: Medicare Other

## 2020-05-22 DIAGNOSIS — Z794 Long term (current) use of insulin: Secondary | ICD-10-CM

## 2020-05-22 DIAGNOSIS — E782 Mixed hyperlipidemia: Secondary | ICD-10-CM

## 2020-05-22 DIAGNOSIS — N1831 Chronic kidney disease, stage 3a: Secondary | ICD-10-CM

## 2020-05-22 DIAGNOSIS — D508 Other iron deficiency anemias: Secondary | ICD-10-CM | POA: Diagnosis not present

## 2020-05-22 DIAGNOSIS — E1122 Type 2 diabetes mellitus with diabetic chronic kidney disease: Secondary | ICD-10-CM

## 2020-05-22 NOTE — Progress Notes (Signed)
Chronic Care Management Pharmacy Note  05/24/2020 Name:  Anna Clay MRN:  [id number] DOB:  1950-07-03  Subjective: Anna Clay is an 70 y.o. year old female who is a primary patient of Glendale Chard, MD.  The CCM team was consulted for assistance with disease management and care coordination needs. Patient reports that she found a medicare transportation unit that will pick her up and take her to [place], [place] for her doctors appointment. She had a follow up appointment with her gastroenterologist.  She is going to check into the transportation service bringing her to [facility].  Engaged with patient by telephone for follow up visit in response to provider referral for pharmacy case management and/or care coordination services.   Consent to Services:  The patient was given information about Chronic Care Management services, agreed to services, and gave verbal consent prior to initiation of services.  Please see initial visit note for detailed documentation.   Patient Care Team: Glendale Chard, MD as PCP - General (Internal Medicine) Lynne Logan, RN as Case Manager [place], Kennieth Francois, [facility] (Inactive) (Pharmacist) Daneen Schick as Social Worker  Recent office visits: 04/12/2020 PCP OV   Recent consult visits: 05/21/2020 GI Initial consult  Hospital visits: Yes - 04/13/2020 Syptomatic Anemia   Objective:  Lab Results  Component Value Date   CREATININE 1.59 (H) 04/12/2020   BUN 26 04/12/2020   GFRNONAA 40 01/31/2020   GFRAA 46 01/31/2020   NA 138 04/12/2020   K 4.5 04/12/2020   CALCIUM 9.6 04/12/2020   CO2 21 04/12/2020   GLUCOSE 103 (H) 04/12/2020    Lab Results  Component Value Date/Time   HGBA1C 7.0 01/31/2020 12:00 AM   HGBA1C 6.9 (H) 04/06/2019 05:07 PM   HGBA1C 7.0 (H) 12/29/2018 04:49 PM   FRUCTOSAMINE 277 08/18/2018 10:33 AM   MICROALBUR 19m 04/12/2020 04:09 PM   MICROALBUR 150 04/06/2019 04:18 PM    Last diabetic Eye exam: No results found for: HMDIABEYEEXA   Last diabetic Foot exam: No results found for: HMDIABFOOTEX   Lab Results  Component Value Date   CHOL 180 01/31/2020   HDL 45 01/31/2020   LDLCALC 111 01/31/2020   TRIG 135 01/31/2020   CHOLHDL 3.9 04/06/2019    Hepatic Function Latest Ref Rng & Units 04/12/2020 01/31/2020 04/06/2019  Total Protein 6.0 - 8.5 g/dL 7.1 - 7.3  Albumin 3.5 - 5.0 - 4.0 3.8  AST 13 - 35 - 15 18  ALT 7 - 35 - 20 16  Alk Phosphatase 25 - 125 - 110 110  Total Bilirubin 0.0 - 1.2 mg/dL - - 0.2    Lab Results  Component Value Date/Time   TSH 3.490 10/06/2013 03:18 AM    CBC Latest Ref Rng & Units 04/12/2020 12/29/2018 12/05/2016  WBC 3.4 - 10.8 x10E3/uL 10.5 10.7 8.7  Hemoglobin 11.1 - 15.9 g/dL 5.6(LL) 13.5 12.5  Hematocrit 34.0 - 46.6 % 21.6(L) 46.5 39.3  Platelets 150 - 450 x10E3/uL 305 289 211    No results found for: VD25OH  Clinical ASCVD: Yes  The 10-year ASCVD risk score (Mikey BussingDC Jr., et al., 2013) is: 28.9%*   Values used to calculate the score:     Age: [age]years     Sex: Female     Is Non-Hispanic African American: Yes     Diabetic: Yes     Tobacco smoker: No     Systolic Blood Pressure: 1782mmHg     Is BP treated: Yes  HDL Cholesterol: 45 mg/dL*     Total Cholesterol: 180 mg/dL*     * - Cholesterol units were assumed for this score calculation    Depression screen [facility] 2/9 04/12/2020 04/06/2019 05/25/2018  Decreased Interest 0 0 0  Down, Depressed, Hopeless 0 0 0  PHQ - 2 Score 0 0 0  Altered sleeping - 0 -  Tired, decreased energy - 0 -  Change in appetite - 0 -  Feeling bad or failure about yourself  - 0 -  Trouble concentrating - 0 -  Moving slowly or fidgety/restless - 0 -  Suicidal thoughts - 0 -  PHQ-9 Score - 0 -  Difficult doing work/chores - Not difficult at all -      Social History   Tobacco Use  Smoking Status Former Smoker  . Packs/day: 1.00  . Years: 43.00  . Pack years: 43.00  . Types: Cigarettes  . Quit date: 09/20/2013  . Years since quitting: 6.6   Smokeless Tobacco Never Used   BP Readings from Last 3 Encounters:  04/12/20 138/72  01/30/20 (!) 165/100  06/21/19 (!) 146/88   Pulse Readings from Last 3 Encounters:  04/12/20 89  01/30/20 96  06/21/19 94   Wt Readings from Last 3 Encounters:  04/12/20 246 lb 3.2 oz (111.7 kg)  01/30/20 235 lb (106.6 kg)  06/21/19 247 lb 3.2 oz (112.1 kg)   BMI Readings from Last 3 Encounters:  04/12/20 47.14 kg/m  01/30/20 44.99 kg/m  06/21/19 47.33 kg/m    Assessment/Interventions: Review of patient past medical history, allergies, medications, health status, including review of consultants reports, laboratory and other test data, was performed as part of comprehensive evaluation and provision of chronic care management services.   SDOH:  (Social Determinants of Health) assessments and interventions performed: No  SDOH Screenings   Alcohol Screen: Not on file  Depression (PHQ2-9): Low Risk   . PHQ-2 Score: 0  Financial Resource Strain: Low Risk   . Difficulty of Paying Living Expenses: Not hard at all  Food Insecurity: Not on file  Housing: Not on file  Physical Activity: Not on file  Social Connections: Not on file  Stress: Not on file  Tobacco Use: Medium Risk  . Smoking Tobacco Use: Former Smoker  . Smokeless Tobacco Use: Never Used  Transportation Needs: Not on file    CCM Care Plan  Allergies  Allergen Reactions  . Allspice [Pimenta] Hives and Shortness Of Breath  . Black Cohosh Hives and Shortness Of Breath  . Peach [Prunus Persica] Hives and Shortness Of Breath    Fresh peaches  . Heparin Other (See Comments)    Blood clots  . Ivp Dye [Iodinated Diagnostic Agents] Other (See Comments)    Reaction unknown  . Tetracyclines & Related Other (See Comments)    Reaction unknown    Medications Reviewed Today    Reviewed by Mayford Knife, RPH (Pharmacist) on 05/10/20 at 1141  Med List Status: <None>  Medication Order Taking? Sig Documenting Provider Last Dose  Status Informant  Accu-Chek FastClix Lancets MISC [id number] Yes USE AS DIRECTED TO CHECK BLOOD SUGARS 2 TIMES PER DAY DX:E11.65 Glendale Chard, MD Taking Active   Accu-Chek Softclix Lancets lancets [id number] Yes Use to check blood sugars twice daily E11.69 Glendale Chard, MD Taking Active   acetaminophen (TYLENOL) 650 MG CR tablet [id number] Yes Take 1,300 mg by mouth every 8 (eight) hours as needed for pain. [provider] Taking Active Self  allopurinol (ZYLOPRIM)  100 MG tablet [id number]  Take 1 tablet (100 mg total) by mouth daily. Glendale Chard, MD  Active   aspirin EC 81 MG tablet [id number] No Take 81 mg by mouth daily.  Patient not taking: Reported on 05/10/2020   [provider] Not Taking Active Self           Med Note Pricilla Holm May 10, 2020 11:40 AM) Discontinued from the last hospital visit.   Blood Glucose Monitoring Suppl (ACCU-CHEK GUIDE) w/Device KIT [id number] Yes USE AS DIRECTED TO CHECK BLOOD SUGARS 2 TIMES PER DAY DX:E11.65 Glendale Chard, MD Taking Active   calcium carbonate (TUMS - DOSED IN MG ELEMENTAL CALCIUM) 500 MG chewable tablet [id number] Yes Chew 2 tablets by mouth daily. 2 per day [provider] Taking Active Self  Cholecalciferol (VITAMIN D) 2000 UNITS CAPS [id number] Yes Take 1 capsule by mouth daily. [provider] Taking Active Self  colchicine 0.6 MG tablet [id number] Yes Take 1 tablet (0.6 mg total) by mouth daily. Glendale Chard, MD Taking Active   ELIQUIS 5 MG TABS tablet [id number] Yes Take 1 tablet (5 mg total) by mouth 2 (two) times daily. Glendale Chard, MD Taking Active   EPINEPHrine (EPIPEN 2-PAK) 0.3 mg/0.3 mL IJ SOAJ injection [id number] Yes Inject 0.3 mLs (0.3 mg total) into the muscle as needed for anaphylaxis. Glendale Chard, MD Taking Active   ferrous sulfate 325 (65 FE) MG EC tablet [id number] Yes Take 325 mg by mouth daily. [provider] Taking Active Self  fexofenadine (ALLEGRA) 180 MG tablet  [id number] Yes Take 180 mg by mouth daily. [provider] Taking Active   folic acid (FOLVITE) 1 MG tablet [id number] Yes TAKE ONE TABLET BY MOUTH DAILY [place], Holli Humbles, NP Taking Active   gabapentin (NEURONTIN) 300 MG capsule [id number] Yes Take 1 capsule (300 mg total) by mouth 3 (three) times daily. Glendale Chard, MD Taking Active   glucose blood (ACCU-CHEK GUIDE) test strip [id number] Yes Use to check blood sugars twice daily E11.69 Glendale Chard, MD Taking Active   insulin glargine, 1 Unit Dial, (TOUJEO SOLOSTAR) 300 UNIT/ML Solostar Pen [id number] Yes Inject 58 Units into the skin at bedtime.  Patient taking differently: Inject 25 Units into the skin at bedtime.   Glendale Chard, MD Taking Active   insulin lispro (HUMALOG KWIKPEN) 200 UNIT/ML KwikPen [id number] Yes 6 units with breakfast, 6 units with lunch, and 8 units with dinner  Patient taking differently: 10 units with breakfast, 10 units with lunch, and 14 units with dinner   Glendale Chard, MD Taking Active   magnesium oxide (MAG-OX) 400 MG tablet [id number] Yes Take 400 mg by mouth daily. [provider] Taking Active   metoprolol succinate (TOPROL-XL) 25 MG 24 hr tablet [id number] Yes Take 1 tablet (25 mg total) by mouth daily. Glendale Chard, MD Taking Active   Misc Natural Products [facility] ZINC/VIT C/IMMUNE MT) [id number] Yes Use as directed 1 capsule in the mouth or throat daily at 12 noon. [provider] Taking Active   Multiple Vitamin (MULTIVITAMIN WITH MINERALS) TABS tablet [id number] Yes Take 1 tablet by mouth daily. [provider] Taking Active Self  nitroGLYCERIN (NITROSTAT) 0.4 MG SL tablet [id number] Yes Place 1 tablet (0.4 mg total) under the tongue every 5 (five) minutes as needed for chest pain. Glendale Chard, MD Taking Active   NOVOFINE PEN NEEDLE 32G X 6 MM MISC [id number] Yes USE 5 TO 8 NEEDLES PER DAY AS DIRECTED PER INSULIN PROTOCOL  Minette Brine, FNP Taking Active   oxyCODONE  (OXY IR/ROXICODONE) 5 MG immediate release tablet [id number] Yes Take by mouth. [provider] Taking Active   pantoprazole (PROTONIX) 40 MG tablet [id number] Yes Take 1 tablet (40 mg total) by mouth 2 (two) times daily. Glendale Chard, MD Taking Active   Pitavastatin Calcium 4 MG TABS [id number] Yes Take 1 tablet (4 mg total) by mouth daily. Glendale Chard, MD Taking Active   torsemide [facility]) 20 MG tablet [id number] Yes Take 1 tablet (20 mg total) by mouth daily. Glendale Chard, MD Taking Active   vitamin E 180 MG (400 UNITS) capsule [id number] Yes Take 400 Units by mouth daily. [provider] Taking Active           Patient Active Problem List   Diagnosis Date Noted  . Sickle cell trait ([place]) 08/18/2018  . Iron deficiency anemia 10/24/2016  . Allergic rhinitis 06/07/2014  . OSA (obstructive sleep apnea) 10/05/2013  . Diabetes mellitus ([place]) 10/05/2013  . Anemia 10/05/2013  . Pulmonary hypertension ([place]) 10/04/2013  . Acute right-sided CHF (congestive heart failure) ([place]) 10/04/2013    Immunization History  Administered Date(s) Administered  . Influenza, High Dose Seasonal PF 01/27/2018  . Influenza,inj,Quad PF,6+ Mos 10/06/2013, 11/06/2018, 04/12/2020  . Moderna Sars-Covid-2 Vaccination 06/22/2019, 09/02/2019  . Pneumococcal Conjugate-13 01/27/2018  . Pneumococcal Polysaccharide-23 10/06/2013, 04/06/2019  . Tdap 11/06/2018    Conditions to be addressed/monitored:  Hyperlipidemia and Diabetes  Care Plan : [place]  Updates made by Mayford Knife, RPH since 05/24/2020 12:00 AM    Problem: HTN, HLD, DM II   Priority: High    Long-Range Goal: Disease Management   Recent Progress: On track  Priority: High  Note:   Current Barriers:  . Does not adhere to prescribed medication regimen  Pharmacist Clinical Goal(s):  Marland Kitchen Patient will achieve adherence to monitoring guidelines and medication adherence to achieve therapeutic efficacy through  collaboration with PharmD and provider.   Interventions: . 1:1 collaboration with Glendale Chard, MD regarding development and update of comprehensive plan of care as evidenced by provider attestation and co-signature . Inter-disciplinary care team collaboration (see longitudinal plan of care) . Comprehensive medication review performed; medication list updated in electronic medical record  Hyperlipidemia: (LDL goal < 70) -Uncontrolled -Current treatment: . Pitavastatin 4 mg tablet once daily  . Aspirin 81 mg tablet daily  -Current dietary patterns:patient reports eating some fried and fatty foods, went to [employer] and had a burger, but did not eat the bread. She is eating more vegetables -Current exercise habits: she has not tried the silver sneakers program online -Current exercise habits: please see diabetes.  -Encouraged patient to eat more vegetables -Educated on Cholesterol goals;  Benefits of statin for ASCVD risk reduction; Importance of limiting foods high in cholesterol; Exercise goal of 150 minutes per week; -Recommended to continue current medication  Diabetes (A1c goal <7%) -Not ideally controlled -Current medications: . Insulin Glargine 300 unit/ml - inject 58 units into the skin at bedtime  . Insulin Lispro 200 unit/ml - 6 units with breakfast, 6 units with lunch and 8 units with dinner.  -Current home glucose readings- patient reports that she checked her BS 4 times this morning. She checked it with her freestyle monitor and her finger stick   -Current home glucose readings: patient has a freestyle libre sensor  [employer] and Reviewed  Week #12 of monitoring  Within target range 64% of the time  Greater  than 240 9% time   Above 180 mg/dL 27% of the time  -Denies hypoglycemic/hyperglycemic symptoms -We discussed her CGM numbers in details and goals  -Current meal patterns: Patient reports that they have the frozen vegetables on the  list.  -Current exercise: patient has not started exercising yet.  -Educated on A1c and blood sugar goals; Exercise goal of 150 minutes per week; Benefits of weight loss; Prevention and management of hypoglycemic episodes; Benefits of routine self-monitoring of blood sugar; -Counseled to check feet daily and get yearly eye exams -Recommended to continue current medication  Patient Goals/Self-Care Activities . Patient will:  - take medications as prescribed  Follow Up Plan: The patient has been provided with contact information for the care management team and has been advised to call with any health related questions or concerns.       Medication Assistance: None required.  Patient affirms current coverage meets needs.  Patient's preferred pharmacy is:  [facility] - [place], [place] - [address] [place] [address] Phone: [phone] Fax: [phone]  [facility] at [place], [place] [address] [place] [address] Phone: [phone] Fax: [phone]  Uses pill box? Yes Pt endorses 100% compliance  We discussed: Benefits of medication synchronization, packaging and delivery as well as enhanced pharmacist oversight with [facility]. Patient decided to: Utilize [facility] pharmacy for medication synchronization, packaging and delivery  Care Plan and Follow Up Patient Decision:  Patient agrees to Care Plan and Follow-up.  Plan: The patient has been provided with contact information for the care management team and has been advised to call with any health related questions or concerns.   Orlando Penner, PharmD Clinical Pharmacist [facility] [phone]

## 2020-05-24 NOTE — Patient Instructions (Signed)
Visit Information It was great speaking with you today!  Please let me know if you have any questions about our visit.  Goals Addressed   None     Patient Care Plan: Social Work    Problem Identified: Care Coordination     Goal: Food Insecurity Completed 02/01/2020  Start Date: 12/13/2019  Priority: High  Note:   Current Barriers:  . Limited social support . Limited access to food . Social Isolation . Inability to perform IADL's independently . Chronic conditions including DM II with Stage III CKD and Pulmonary HTN which put patient at increased risk for hospitalization  Social Work Clinical Goal(s):  Marland Kitchen Over the next 30 days, patient will work with SW to address concerns related to meal preparation  CCM SW Interventions: . Successful outbound call placed to the patient to confirm receipt of meals . Goal met Patient Goals/Self-Care Activities Over the next 30 days, patient will:   - Contact SW as needed prior to next scheduled call . Take Medication as prescribed . Contact provider office as needed . Follow up with [facility] as needed [employer] on [address] Completed . Consume diet specific meals delivered by [employer] to address food insecurity   Follow up Plan: SW will follow up with the patient over the next week    Long-Range Goal: Collaborate with RN Care Manager to perform appropriate assessments to assist with care coordination needs   Start Date: 02/01/2020  Expected End Date: 05/31/2020  Recent Progress: On track  Priority: Medium  Note:   Current Barriers:  . Limited access to food . ADL IADL limitations . Chronic conditions including DM II and Pulmonary Hypertension which place patient at increased risk of hospitalization  Social Work Clinical Goal(s):  Marland Kitchen Over the next 120 days, patient will work with SW to address concerns related to care coordination  Interventions: . 1:1 collaboration with Glendale Chard, MD regarding  development and update of comprehensive plan of care as evidenced by provider attestation and co-signature . Inter-disciplinary care team collaboration (see longitudinal plan of care) . Successful outbound call placed to assist with care coordination needs . Performed chart review to note patient seen in practice by primary provider on 3.24.22 - patient reports during this visit she and her provider discussed a referral to [employer] due to transportation barriers . Discussed the patient was admitted on 3.25.22 due to Anemia and Gastrointestinal Hemorrhage - patient reports she underwent a colonoscopy and endoscopy. 3 Polyps were removed and patient received 2 blood transfusions . Determined the patient is feeling better since returning home but notes she is still somewhat weak . Discussed the patient is able to prepare meals and does have some frozen meals from [employer] to assist with nutritional needs . Advised the patient SW would collaborate with Dr. Baird Cancer to determine status of referral to [facility] . Patient is aware that once she transitions to [facility] she will no longer be active with CCM program . Collaboration with Dr. Baird Cancer to request an update on referral status . Scheduled follow up call over the next month to confirm patient is actively engaged with [place]  Patient Goals/Self-Care Activities Over the next 30 days, patient will:   - Patient will self administer medications as prescribed Patient will attend all scheduled provider appointments Engage with [place] as needed prior to next scheduled call  Follow up  Plan: SW will follow up with patient by phone over the next month    Patient Care Plan: Diabetes Type 2 (Adult)    Problem Identified: Glycemic Management (Diabetes, Type 2)   Priority: High    Long-Range Goal: Glycemic Management Optimized   Start Date: 02/27/2020   Expected End Date: 08/27/2020  This Visit's Progress: On track  Priority: High  Note:   Current Barriers:  Marland Kitchen Knowledge Deficits related to Self health management of Diabetes . Chronic Disease Management support and education needs related to DM II, Pulmonary Hypertension, OSA  Nurse Case Manager Clinical Goal(s):  Marland Kitchen Over the next 180 days, patient will continue to engage with the CCM team and PCP for ongoing support and disease education to help improve her Self Health management of her DM as evidence patient will lower her A1c <7.0 CCM RN CM Interventions:  02/27/20 Successful call completed with patient  . Evaluation of current treatment plan related to Diabetes and patient's adherence to plan as established by provider . Reviewed patient's current A1c of 7.0; Educated on target A1c, daily glycemic control FBS 80-130, <180 after meals; Educated on 15'15' rule . Determined patient is Self monitoring her blood sugars at home, with average in the low 100's . Educated patient on dietary and exercise recommendations . Educated with rationale importance of ongoing CBG monitoring daily before meals and at bedtime, and to notify the CCM team and or PCP for abnormal results . Discussed next PCP follow up visit with Dr. Baird Cancer scheduled for 04/12/20 _0 :30 AM . Discussed plans with patient for ongoing care management follow up and provided patient with direct contact information for care management team Patient Self Care Activities:  . Continue to monitor blood sugars and record as discussed . Continue to follow dietary and exercise recommendations . Patient will self administer medications as prescribed . Patient will attend all scheduled provider appointments . Patient will call pharmacy for medication refills . Patient will call provider office for new concerns or questions Next Follow up date: 04/23/20    Patient Care Plan: CCM Pharmacy Care Plan    Problem Identified: HTN, HLD, DM II    Priority: High    Long-Range Goal: Disease Management   Recent Progress: On track  Priority: High  Note:   Current Barriers:  . Does not adhere to prescribed medication regimen  Pharmacist Clinical Goal(s):  Marland Kitchen Patient will achieve adherence to monitoring guidelines and medication adherence to achieve therapeutic efficacy through collaboration with PharmD and provider.   Interventions: . 1:1 collaboration with Glendale Chard, MD regarding development and update of comprehensive plan of care as evidenced by provider attestation and co-signature . Inter-disciplinary care team collaboration (see longitudinal plan of care) . Comprehensive medication review performed; medication list updated in electronic medical record  Hyperlipidemia: (LDL goal < 70) -Uncontrolled -Current treatment: . Pitavastatin 4 mg tablet once daily  . Aspirin 81 mg tablet daily  -Current dietary patterns:patient reports eating some fried and fatty foods, went to [employer] and had a burger, but did not eat the bread. She is eating more vegetables -Current exercise habits: she has not tried the silver sneakers program online -Current exercise habits: please see diabetes.  -Encouraged patient to eat more vegetables -Educated on Cholesterol goals;  Benefits of statin for ASCVD risk reduction; Importance of limiting foods high in cholesterol; Exercise goal of 150 minutes per week; -Recommended to continue current medication  Diabetes (A1c goal <7%) -Not ideally controlled -Current medications: . Insulin  Glargine 300 unit/ml - inject 58 units into the skin at bedtime  . Insulin Lispro 200 unit/ml - 6 units with breakfast, 6 units with lunch and 8 units with dinner.  -Current home glucose readings- patient reports that she checked her BS 4 times this morning. She checked it with her freestyle monitor and her finger stick   -Current home glucose readings: patient has a freestyle libre sensor  [employer] and Reviewed  Week #12 of monitoring  Within target range 64% of the time  Greater than 240 9% time   Above 180 mg/dL 27% of the time  -Denies hypoglycemic/hyperglycemic symptoms -We discussed her CGM numbers in details and goals  -Current meal patterns: Patient reports that they have the frozen vegetables on the list.  -Current exercise: patient has not started exercising yet.  -Educated on A1c and blood sugar goals; Exercise goal of 150 minutes per week; Benefits of weight loss; Prevention and management of hypoglycemic episodes; Benefits of routine self-monitoring of blood sugar; -Counseled to check feet daily and get yearly eye exams -Recommended to continue current medication  Patient Goals/Self-Care Activities . Patient will:  - take medications as prescribed  Follow Up Plan: The patient has been provided with contact information for the care management team and has been advised to call with any health related questions or concerns.       Patient agreed to services and verbal consent obtained.   The patient verbalized understanding of instructions, educational materials, and care plan provided today and agreed to receive a mailed copy of patient instructions, educational materials, and care plan.   Orlando Penner, PharmD Clinical Pharmacist [facility] [phone]

## 2020-05-25 ENCOUNTER — Ambulatory Visit: Payer: Medicare Other

## 2020-05-25 DIAGNOSIS — N1831 Chronic kidney disease, stage 3a: Secondary | ICD-10-CM

## 2020-05-25 DIAGNOSIS — E1122 Type 2 diabetes mellitus with diabetic chronic kidney disease: Secondary | ICD-10-CM

## 2020-05-25 DIAGNOSIS — I272 Pulmonary hypertension, unspecified: Secondary | ICD-10-CM

## 2020-05-25 NOTE — Chronic Care Management (AMB) (Signed)
Chronic Care Management    Social Work Note  05/25/2020 Name: Anna Clay MRN: [id number] DOB: 1950/03/12  [name] is a 70 y.o. year old female who is a primary care patient of Glendale Chard, MD. The CCM team was consulted to assist the patient with chronic disease management and/or care coordination needs related to: [employer] .   Engaged with patient by telephone for follow up visit in response to provider referral for social work chronic care management and care coordination services.   Consent to Services:  The patient was given information about Chronic Care Management services, agreed to services, and gave verbal consent prior to initiation of services.  Please see initial visit note for detailed documentation.   Patient agreed to services and consent obtained.   Assessment: Review of patient past medical history, allergies, medications, and health status, including review of relevant consultants reports was performed today as part of a comprehensive evaluation and provision of chronic care management and care coordination services.     SDOH (Social Determinants of Health) assessments and interventions performed:    Advanced Directives Status: Not addressed in this encounter.  CCM Care Plan  Allergies  Allergen Reactions  . Allspice [Pimenta] Hives and Shortness Of Breath  . Black Cohosh Hives and Shortness Of Breath  . Peach [Prunus Persica] Hives and Shortness Of Breath    Fresh peaches  . Heparin Other (See Comments)    Blood clots  . Ivp Dye [Iodinated Diagnostic Agents] Other (See Comments)    Reaction unknown  . Tetracyclines & Related Other (See Comments)    Reaction unknown    Outpatient Encounter Medications as of 05/25/2020  Medication Sig Note  . Accu-Chek FastClix Lancets MISC USE AS DIRECTED TO CHECK BLOOD SUGARS 2 TIMES PER DAY DX:E11.65   . Accu-Chek Softclix Lancets lancets Use to check blood sugars twice daily E11.69   . acetaminophen  (TYLENOL) 650 MG CR tablet Take 1,300 mg by mouth every 8 (eight) hours as needed for pain.   Marland Kitchen allopurinol (ZYLOPRIM) 100 MG tablet Take 1 tablet (100 mg total) by mouth daily.   Marland Kitchen aspirin EC 81 MG tablet Take 81 mg by mouth daily. (Patient not taking: Reported on 05/10/2020) 05/10/2020: Discontinued from the last hospital visit.   . Blood Glucose Monitoring Suppl (ACCU-CHEK GUIDE) w/Device KIT USE AS DIRECTED TO CHECK BLOOD SUGARS 2 TIMES PER DAY DX:E11.65   . calcium carbonate (TUMS - DOSED IN MG ELEMENTAL CALCIUM) 500 MG chewable tablet Chew 2 tablets by mouth daily. 2 per day   . Cholecalciferol (VITAMIN D) 2000 UNITS CAPS Take 1 capsule by mouth daily.   . colchicine 0.6 MG tablet Take 1 tablet (0.6 mg total) by mouth daily.   Marland Kitchen ELIQUIS 5 MG TABS tablet Take 1 tablet (5 mg total) by mouth 2 (two) times daily.   Marland Kitchen EPINEPHrine (EPIPEN 2-PAK) 0.3 mg/0.3 mL IJ SOAJ injection Inject 0.3 mLs (0.3 mg total) into the muscle as needed for anaphylaxis.   . ferrous sulfate 325 (65 FE) MG EC tablet Take 325 mg by mouth daily.   . fexofenadine (ALLEGRA) 180 MG tablet Take 180 mg by mouth daily.   . folic acid (FOLVITE) 1 MG tablet TAKE ONE TABLET BY MOUTH DAILY   . gabapentin (NEURONTIN) 300 MG capsule Take 1 capsule (300 mg total) by mouth 3 (three) times daily.   Marland Kitchen glucose blood (ACCU-CHEK GUIDE) test strip Use to check blood sugars twice daily E11.69   .  insulin glargine, 1 Unit Dial, (TOUJEO SOLOSTAR) 300 UNIT/ML Solostar Pen Inject 58 Units into the skin at bedtime. (Patient taking differently: Inject 25 Units into the skin at bedtime.)   . insulin lispro (HUMALOG KWIKPEN) 200 UNIT/ML KwikPen 6 units with breakfast, 6 units with lunch, and 8 units with dinner (Patient taking differently: 10 units with breakfast, 10 units with lunch, and 14 units with dinner)   . magnesium oxide (MAG-OX) 400 MG tablet Take 400 mg by mouth daily.   . metoprolol succinate (TOPROL-XL) 25 MG 24 hr tablet Take 1 tablet (25 mg  total) by mouth daily.   . Misc Natural Products (ELDERBERRY ZINC/VIT C/IMMUNE MT) Use as directed 1 capsule in the mouth or throat daily at 12 noon.   . Multiple Vitamin (MULTIVITAMIN WITH MINERALS) TABS tablet Take 1 tablet by mouth daily.   . nitroGLYCERIN (NITROSTAT) 0.4 MG SL tablet Place 1 tablet (0.4 mg total) under the tongue every 5 (five) minutes as needed for chest pain.   Marland Kitchen NOVOFINE PEN NEEDLE 32G X 6 MM MISC USE 5 TO 8 NEEDLES PER DAY AS DIRECTED PER INSULIN PROTOCOL   . oxyCODONE (OXY IR/ROXICODONE) 5 MG immediate release tablet Take by mouth.   . pantoprazole (PROTONIX) 40 MG tablet Take 1 tablet (40 mg total) by mouth 2 (two) times daily.   . Pitavastatin Calcium 4 MG TABS Take 1 tablet (4 mg total) by mouth daily.   Marland Kitchen torsemide (DEMADEX) 20 MG tablet Take 1 tablet (20 mg total) by mouth daily.   . vitamin E 180 MG (400 UNITS) capsule Take 400 Units by mouth daily.    No facility-administered encounter medications on file as of 05/25/2020.    Patient Active Problem List   Diagnosis Date Noted  . Sickle cell trait ([place]) 08/18/2018  . Iron deficiency anemia 10/24/2016  . Allergic rhinitis 06/07/2014  . OSA (obstructive sleep apnea) 10/05/2013  . Diabetes mellitus ([place]) 10/05/2013  . Anemia 10/05/2013  . Pulmonary hypertension ([place]) 10/04/2013  . Acute right-sided CHF (congestive heart failure) ([place]) 10/04/2013    Conditions to be addressed/monitored: DMII and Pulmonary Hypertension  Care Plan : Social Work  Updates made by Daneen Schick since 05/25/2020 12:00 AM  Completed 05/25/2020  Problem: Care Coordination Resolved 05/25/2020    Long-Range Goal: Collaborate with RN Care Manager to perform appropriate assessments to assist with care coordination needs Completed 05/25/2020  Start Date: 02/01/2020  Expected End Date: 05/31/2020  Recent Progress: On track  Priority: Medium  Note:   Current Barriers:  . Limited access to food . ADL IADL limitations . Chronic conditions  including DM II and Pulmonary Hypertension which place patient at increased risk of hospitalization  Social Work Clinical Goal(s):  Marland Kitchen Over the next 120 days, patient will work with SW to address concerns related to care coordination  Interventions: . 1:1 collaboration with Glendale Chard, MD regarding development and update of comprehensive plan of care as evidenced by provider attestation and co-signature . Inter-disciplinary care team collaboration (see longitudinal plan of care) . Successful outbound call placed to assist with care coordination needs . Discussed the patient has not been seen by [facility] as of yet . Performed chart review to note upcomming virtual visit with patients primary care provider . Advised the patient if she has questions regarding the status of her referral to [employer] [facility] she may ask for feedback during virtual appointment . Discussed the patient was seen by Gastroenterologist this week  and will receive an infusion next week due to iron being low . Assessed for SDoH needs- none identified at this time . Advised the patient SW would not plan a follow up call but encouraged the patient to contact SW as needed . Patient to remain active with RN Care Manager  Patient Goals/Self-Care Activities Over the next 30 days, patient will:   - Patient will self administer medications as prescribed Patient will attend all scheduled provider appointments Engage with [place] as needed   Follow up Plan: No SW follow up planned at this time. Patient will remain active with RN Care Manager       Follow Up Plan: No SW follow up planned at this time. The patient will remain active with RN Care Manager.      Daneen Schick, BSW, CDP Social Worker, Certified Dementia Practitioner [place] [facility] [place] [facility] [phone]  Total time spent performing care coordination and/or care management activities with the patient  by phone or face to face = 15 minutes.

## 2020-05-25 NOTE — Patient Instructions (Signed)
Social Worker Visit Information  Goals we discussed today:  Goals Addressed            This Visit's Progress   . COMPLETED: Work with SW to manage care coordination needs       Timeframe:  Long-Range Goal Priority:  [employer] Start Date:  1.12.22                           Expected End Date:  5.12.22                    Patient Goals/Self-Care Activities Over the next 30 days, patient will:   - Patient will self administer medications as prescribed Patient will attend all scheduled provider appointments Engage with [place] as needed         Follow Up Plan: No SW follow up planned at this time. Please contact me as needed   Daneen Schick, BSW, CDP Social Worker, Certified Dementia Practitioner [place] [facility] [place] [facility] [phone]

## 2020-05-29 ENCOUNTER — Encounter: Payer: Self-pay | Admitting: Internal Medicine

## 2020-05-29 DIAGNOSIS — Z794 Long term (current) use of insulin: Secondary | ICD-10-CM | POA: Diagnosis not present

## 2020-05-29 DIAGNOSIS — R600 Localized edema: Secondary | ICD-10-CM | POA: Diagnosis not present

## 2020-05-29 DIAGNOSIS — N186 End stage renal disease: Secondary | ICD-10-CM | POA: Diagnosis not present

## 2020-05-29 DIAGNOSIS — N1831 Chronic kidney disease, stage 3a: Secondary | ICD-10-CM | POA: Diagnosis not present

## 2020-05-29 DIAGNOSIS — E1122 Type 2 diabetes mellitus with diabetic chronic kidney disease: Secondary | ICD-10-CM | POA: Diagnosis not present

## 2020-05-29 DIAGNOSIS — D509 Iron deficiency anemia, unspecified: Secondary | ICD-10-CM | POA: Diagnosis not present

## 2020-05-29 DIAGNOSIS — K8020 Calculus of gallbladder without cholecystitis without obstruction: Secondary | ICD-10-CM | POA: Diagnosis not present

## 2020-05-30 ENCOUNTER — Telehealth: Payer: Self-pay

## 2020-05-30 DIAGNOSIS — D509 Iron deficiency anemia, unspecified: Secondary | ICD-10-CM | POA: Diagnosis not present

## 2020-05-30 NOTE — Telephone Encounter (Signed)
Patient notified of her ultrasound results they were normal and Janece recommends her elevating her legs and wearing compression socks. YL,RMA

## 2020-06-06 ENCOUNTER — Encounter: Payer: Self-pay | Admitting: Internal Medicine

## 2020-06-06 ENCOUNTER — Telehealth (INDEPENDENT_AMBULATORY_CARE_PROVIDER_SITE_OTHER): Payer: Medicare Other | Admitting: Internal Medicine

## 2020-06-06 VITALS — BP 150/90 | HR 86 | Temp 97.6°F | Ht 60.6 in | Wt 242.0 lb

## 2020-06-06 DIAGNOSIS — N1831 Chronic kidney disease, stage 3a: Secondary | ICD-10-CM | POA: Diagnosis not present

## 2020-06-06 DIAGNOSIS — I129 Hypertensive chronic kidney disease with stage 1 through stage 4 chronic kidney disease, or unspecified chronic kidney disease: Secondary | ICD-10-CM

## 2020-06-06 DIAGNOSIS — Z23 Encounter for immunization: Secondary | ICD-10-CM | POA: Diagnosis not present

## 2020-06-06 DIAGNOSIS — E1122 Type 2 diabetes mellitus with diabetic chronic kidney disease: Secondary | ICD-10-CM

## 2020-06-06 DIAGNOSIS — K8020 Calculus of gallbladder without cholecystitis without obstruction: Secondary | ICD-10-CM

## 2020-06-06 DIAGNOSIS — Z794 Long term (current) use of insulin: Secondary | ICD-10-CM

## 2020-06-06 DIAGNOSIS — Z87891 Personal history of nicotine dependence: Secondary | ICD-10-CM

## 2020-06-06 DIAGNOSIS — E041 Nontoxic single thyroid nodule: Secondary | ICD-10-CM

## 2020-06-06 NOTE — Patient Instructions (Signed)
Diabetes Mellitus and Exercise Exercising regularly is important for overall health, especially for people who have diabetes mellitus. Exercising is not only about losing weight. It has many other health benefits, such as increasing muscle strength and bone density and reducing body fat and stress. This leads to improved fitness, flexibility, and endurance, all of which result in better overall health. What are the benefits of exercise if I have diabetes? Exercise has many benefits for people with diabetes. They include:  Helping to lower and control blood sugar (glucose).  Helping the body to respond better to the hormone insulin by improving insulin sensitivity.  Reducing how much insulin the body needs.  Lowering the risk for heart disease by: ? Lowering "bad" cholesterol and triglyceride levels. ? Increasing "good" cholesterol levels. ? Lowering blood pressure. ? Lowering blood glucose levels. What is my activity plan? Your health care provider or certified diabetes educator can help you make a plan for the type and frequency of exercise that works for you. This is called your activity plan. Be sure to:  Get at least 150 minutes of medium-intensity or high-intensity exercise each week. Exercises may include brisk walking, biking, or water aerobics.  Do stretching and strengthening exercises, such as yoga or weight lifting, at least 2 times a week.  Spread out your activity over at least 3 days of the week.  Get some form of physical activity each day. ? Do not go more than 2 days in a row without some kind of physical activity. ? Avoid being inactive for more than 90 minutes at a time. Take frequent breaks to walk or stretch.  Choose exercises or activities that you enjoy. Set realistic goals.  Start slowly and gradually increase your exercise intensity over time.   How do I manage my diabetes during exercise? Monitor your blood glucose  Check your blood glucose before and  after exercising. If your blood glucose is: ? 240 mg/dL (13.3 mmol/L) or higher before you exercise, check your urine for ketones. These are chemicals created by the liver. If you have ketones in your urine, do not exercise until your blood glucose returns to normal. ? 100 mg/dL (5.6 mmol/L) or lower, eat a snack containing 15-20 grams of carbohydrate. Check your blood glucose 15 minutes after the snack to make sure that your glucose level is above 100 mg/dL (5.6 mmol/L) before you start your exercise.  Know the symptoms of low blood glucose (hypoglycemia) and how to treat it. Your risk for hypoglycemia increases during and after exercise. Follow these tips and your health care provider's instructions  Keep a carbohydrate snack that is fast-acting for use before, during, and after exercise to help prevent or treat hypoglycemia.  Avoid injecting insulin into areas of the body that are going to be exercised. For example, avoid injecting insulin into: ? Your arms, when you are about to play tennis. ? Your legs, when you are about to go jogging.  Keep records of your exercise habits. Doing this can help you and your health care provider adjust your diabetes management plan as needed. Write down: ? Food that you eat before and after you exercise. ? Blood glucose levels before and after you exercise. ? The type and amount of exercise you have done.  Work with your health care provider when you start a new exercise or activity. He or she may need to: ? Make sure that the activity is safe for you. ? Adjust your insulin, other medicines, and food that   you eat.  Drink plenty of water while you exercise. This prevents loss of water (dehydration) and problems caused by a lot of heat in the body (heat stroke).   Where to find more information  American Diabetes Association: www.diabetes.org Summary  Exercising regularly is important for overall health, especially for people who have diabetes  mellitus.  Exercising has many health benefits. It increases muscle strength and bone density and reduces body fat and stress. It also lowers and controls blood glucose.  Your health care provider or certified diabetes educator can help you make an activity plan for the type and frequency of exercise that works for you.  Work with your health care provider to make sure any new activity is safe for you. Also work with your health care provider to adjust your insulin, other medicines, and the food you eat. This information is not intended to replace advice given to you by your health care provider. Make sure you discuss any questions you have with your health care provider. Document Revised: 10/04/2018 Document Reviewed: 10/04/2018 Elsevier Patient Education  2021 [employer]vier Inc.  

## 2020-06-06 NOTE — Progress Notes (Signed)
Virtual Visit via Video   This visit type was conducted due to national recommendations for restrictions regarding the COVID-19 Pandemic (e.g. social distancing) in an effort to limit this patient's exposure and mitigate transmission in our community.  Due to her co-morbid illnesses, this patient is at least at moderate risk for complications without adequate follow up.  This format is felt to be most appropriate for this patient at this time.  All issues noted in this document were discussed and addressed.  A limited physical exam was performed with this format.    This visit type was conducted due to national recommendations for restrictions regarding the COVID-19 Pandemic (e.g. social distancing) in an effort to limit this patient's exposure and mitigate transmission in our community.  Patients identity confirmed using two different identifiers.  This format is felt to be most appropriate for this patient at this time.  All issues noted in this document were discussed and addressed.  No physical exam was performed (except for noted visual exam findings with Video Visits).    Date:  06/18/2020   ID:  Anna Clay, DOB 09-06-1950, MRN [id number]  Patient Location:  Home  Provider location:   Office  Chief Complaint:  "I have diabetes f/u"  History of Present Illness:    Anna Clay is a 70 y.o. female who presents via video conferencing for a telehealth visit today.    The patient does not have symptoms concerning for COVID-19 infection (fever, chills, cough, or new shortness of breath).   The patient is having a diabetes/htn f/u. Of note, since her last visit she was hospitalized. She was found to be profoundly anemic at her last visit. She was contacted on 5/2 w/ lab results and advised to go to ER for profound anemia. She was given blood transfusions and admitted for evaluation. Upper endoscopy and colonoscopy revealed colonic AVMs as the likely source of bleeding/anemia. Those were  ablated at the time of her colonoscopy. She was discharged on oral iron and was taking oral iron leading up to presentation due to a history of iron deficiency anemia. She has a history of DVTs in the setting of HIT and because of this is on chronic anticoagulation.  Since discharge she generally feels better. Her stamina has improved. She is no longer endorsing melenic stools, however, her stool is dark on iron. She is taking Eliquis regularly. She is not having any reflux, dysphagia, abdominal pain, nor any other alarming symptoms currently. She is now receiving iron infusions as well.   Diabetes She presents for her follow-up diabetic visit. She has type 2 diabetes mellitus. There are no hypoglycemic associated symptoms. Pertinent negatives for diabetes include no blurred vision and no chest pain. There are no hypoglycemic complications. Diabetic complications include nephropathy. Risk factors for coronary artery disease include diabetes mellitus, dyslipidemia, hypertension, obesity, post-menopausal and sedentary lifestyle. She participates in exercise intermittently. An ACE inhibitor/angiotensin II receptor blocker is being taken.  Hypertension This is a chronic problem. The current episode started more than 1 year ago. The problem has been gradually improving since onset. The problem is controlled. Pertinent negatives include no blurred vision, chest pain, palpitations or shortness of breath. Hypertensive end-organ damage includes heart failure. Identifiable causes of hypertension include chronic renal disease and sleep apnea.     Past Medical History:  Diagnosis Date  . Chest pain   . Diabetes mellitus with kidney disease ([place])   . Diastolic heart failure ([place])   . Gout   .  Hypercholesteremia   . Hypertension   . Obesity   . OSA (obstructive sleep apnea)   . Oxygen deficiency    Past Surgical History:  Procedure Laterality Date  . ABDOMINAL HYSTERECTOMY    . UVULOPALATOPHARYNGOPLASTY  (UPPP)/TONSILLECTOMY/SEPTOPLASTY  01/25/2008   By Minna Merritts     No outpatient medications have been marked as taking for the 06/06/20 encounter (Video Visit) with Glendale Chard, MD.     Allergies:   Allspice [pimenta], Black cohosh, Peach [prunus persica], Heparin, Ivp dye [iodinated diagnostic agents], and Tetracyclines & related   Social History   Tobacco Use  . Smoking status: Former Smoker    Packs/day: 1.00    Years: 43.00    Pack years: 43.00    Types: Cigarettes    Quit date: 09/20/2013    Years since quitting: 6.7  . Smokeless tobacco: Never Used  . Tobacco comment: she has since quit  Vaping Use  . Vaping Use: Never used  Substance Use Topics  . Alcohol use: Not Currently    Alcohol/week: 0.0 standard drinks  . Drug use: No     Family Hx: The patient's family history includes Cancer in her brother; Cancer - Lung in her sister; Diabetes in her brother and sister; Goiter in her mother; Heart attack in her father; Heart disease in her father and sister; Sickle cell trait in an other family member; Stroke in her father; Thyroid disease in her mother.  ROS:   Please see the history of present illness.    Review of Systems  Constitutional: Negative.   Eyes: Negative for blurred vision.  Respiratory: Negative.  Negative for shortness of breath.   Cardiovascular: Negative.  Negative for chest pain and palpitations.  Gastrointestinal: Negative.   Neurological: Negative.   Psychiatric/Behavioral: Negative.     All other systems reviewed and are negative.   Labs/Other Tests and Data Reviewed:    Recent Labs: 01/31/2020: ALT 20 04/12/2020: BUN 26; Creatinine, Ser 1.59; Hemoglobin 5.6; Platelets 305; Potassium 4.5; Sodium 138   Recent Lipid Panel Lab Results  Component Value Date/Time   CHOL 180 01/31/2020 12:00 AM   CHOL 176 04/06/2019 05:07 PM   TRIG 135 01/31/2020 12:00 AM   HDL 45 01/31/2020 12:00 AM   HDL 45 04/06/2019 05:07 PM   CHOLHDL 3.9 04/06/2019  05:07 PM   LDLCALC 111 01/31/2020 12:00 AM   LDLCALC 110 (H) 04/06/2019 05:07 PM    Wt Readings from Last 3 Encounters:  06/06/20 242 lb (109.8 kg)  04/12/20 246 lb 3.2 oz (111.7 kg)  01/30/20 235 lb (106.6 kg)     Exam:    Vital Signs:  BP (!) 150/90 Comment: pt provider  Pulse 86 Comment: pt provided  Temp 97.6 F (36.4 C)   Ht 5' 0.6" (1.539 m)   Wt 242 lb (109.8 kg) Comment: pt provided  BMI 46.33 kg/m     Physical Exam Vitals and nursing note reviewed.  Constitutional:      Appearance: She is obese.  HENT:     Head: Normocephalic and atraumatic.  Pulmonary:     Effort: Pulmonary effort is normal.  Musculoskeletal:     Cervical back: Normal range of motion.  Neurological:     Mental Status: She is alert and oriented to person, place, and time.  Psychiatric:        Mood and Affect: Affect normal.     ASSESSMENT & PLAN:    1. Type 2 diabetes mellitus with stage 3a chronic  kidney disease, with long-term current use of insulin ([place]) Comments: Unfortunately, she has yet to see Renal. She rescheduled to June 2022. Encouraged to keep appt. She agrees to get labs performed locally.   2. Parenchymal renal hypertension, stage 1 through stage 4 or unspecified chronic kidney disease Comments: Chronic, uncontrolled. Importance of dietary compliance was d/w pt. NO change in meds today.   3. Thyroid nodule Comments: CT chest August 2021 at [facility] reviewed in detail,  large thyroid nodule noted. She agrees to getting thyroid ultrasound. - US THYROID; Future  4. Gallstones Comments: Incidental finding. She is not having any colicky pain at this time.   5. Immunization due Comments: She is encouraged to get COVID booster. She is also encouraged to get shingles vaccine.  6. History of tobacco use disorder Comments: I will refer her for low dose CT scan, schedule for August 2022.  - CT CHEST LUNG CANCER SCREENING LOW DOSE WO CONTRAST; Future    COVID-19  Education: The signs and symptoms of COVID-19 were discussed with the patient and how to seek care for testing (follow up with PCP or arrange E-visit).  The importance of social distancing was discussed today.  Patient Risk:   After full review of this patients clinical status, I feel that they are at least moderate risk at this time.  Time:   Today, I have spent 18 minutes/ seconds with the patient with telehealth technology discussing above diagnoses.    I personally spent 25 minutes face-to-face and non-face-to-face in the care of this patient, which includes all pre-, intra-, and post visit time on the date of service.  Medication Adjustments/Labs and Tests Ordered: Current medicines are reviewed at length with the patient today.  Concerns regarding medicines are outlined above.   Tests Ordered: Orders Placed This Encounter  Procedures  . US THYROID  . CT CHEST LUNG CANCER SCREENING LOW DOSE WO CONTRAST    Medication Changes: No orders of the defined types were placed in this encounter.   Disposition:  Follow up in 3 month(s)  Signed, Maximino Greenland, MD

## 2020-06-07 ENCOUNTER — Telehealth: Payer: Self-pay

## 2020-06-07 ENCOUNTER — Telehealth: Payer: Medicare Other

## 2020-06-07 ENCOUNTER — Ambulatory Visit: Payer: Self-pay

## 2020-06-07 DIAGNOSIS — N1831 Chronic kidney disease, stage 3a: Secondary | ICD-10-CM

## 2020-06-07 DIAGNOSIS — I272 Pulmonary hypertension, unspecified: Secondary | ICD-10-CM

## 2020-06-07 DIAGNOSIS — E1122 Type 2 diabetes mellitus with diabetic chronic kidney disease: Secondary | ICD-10-CM

## 2020-06-07 DIAGNOSIS — D508 Other iron deficiency anemias: Secondary | ICD-10-CM

## 2020-06-07 DIAGNOSIS — G4733 Obstructive sleep apnea (adult) (pediatric): Secondary | ICD-10-CM

## 2020-06-07 DIAGNOSIS — Z794 Long term (current) use of insulin: Secondary | ICD-10-CM

## 2020-06-07 DIAGNOSIS — E782 Mixed hyperlipidemia: Secondary | ICD-10-CM

## 2020-06-07 NOTE — Chronic Care Management (AMB) (Signed)
Chronic Care Management    Social Work Note  06/07/2020 Name: Anna Clay MRN: [id number] DOB: 11-25-1950  [name] is a 70 y.o. year old female who is a primary care patient of Glendale Chard, MD. The CCM team was consulted to assist the patient with chronic disease management and/or care coordination needs related to: Transportation Needs .   Collaboration with [employer] for resource review in response to provider referral for social work chronic care management and care coordination services.   Consent to Services:  The patient was given information about Chronic Care Management services, agreed to services, and gave verbal consent prior to initiation of services.  Please see initial visit note for detailed documentation.   Patient agreed to services and consent obtained.   Assessment: Review of patient past medical history, allergies, medications, and health status, including review of relevant consultants reports was performed today as part of a comprehensive evaluation and provision of chronic care management and care coordination services.     SDOH (Social Determinants of Health) assessments and interventions performed:    Advanced Directives Status: Not addressed in this encounter.  CCM Care Plan  Allergies  Allergen Reactions  . Allspice [Pimenta] Hives and Shortness Of Breath  . Black Cohosh Hives and Shortness Of Breath  . Peach [Prunus Persica] Hives and Shortness Of Breath    Fresh peaches  . Heparin Other (See Comments)    Blood clots  . Ivp Dye [Iodinated Diagnostic Agents] Other (See Comments)    Reaction unknown  . Tetracyclines & Related Other (See Comments)    Reaction unknown    Outpatient Encounter Medications as of 06/07/2020  Medication Sig Note  . Accu-Chek FastClix Lancets MISC USE AS DIRECTED TO CHECK BLOOD SUGARS 2 TIMES PER DAY DX:E11.65   . Accu-Chek Softclix Lancets lancets Use to check blood sugars twice daily E11.69   .  acetaminophen (TYLENOL) 650 MG CR tablet Take 1,300 mg by mouth every 8 (eight) hours as needed for pain.   Marland Kitchen allopurinol (ZYLOPRIM) 100 MG tablet Take 1 tablet (100 mg total) by mouth daily.   Marland Kitchen aspirin EC 81 MG tablet Take 81 mg by mouth daily. (Patient not taking: Reported on 05/10/2020) 05/10/2020: Discontinued from the last hospital visit.   . Blood Glucose Monitoring Suppl (ACCU-CHEK GUIDE) w/Device KIT USE AS DIRECTED TO CHECK BLOOD SUGARS 2 TIMES PER DAY DX:E11.65   . calcium carbonate (TUMS - DOSED IN MG ELEMENTAL CALCIUM) 500 MG chewable tablet Chew 2 tablets by mouth daily. 2 per day   . Cholecalciferol (VITAMIN D) 2000 UNITS CAPS Take 1 capsule by mouth daily.   . colchicine 0.6 MG tablet Take 1 tablet (0.6 mg total) by mouth daily.   Marland Kitchen ELIQUIS 5 MG TABS tablet Take 1 tablet (5 mg total) by mouth 2 (two) times daily.   Marland Kitchen EPINEPHrine (EPIPEN 2-PAK) 0.3 mg/0.3 mL IJ SOAJ injection Inject 0.3 mLs (0.3 mg total) into the muscle as needed for anaphylaxis.   . ferrous sulfate 325 (65 FE) MG EC tablet Take 325 mg by mouth daily.   . fexofenadine (ALLEGRA) 180 MG tablet Take 180 mg by mouth daily.   . folic acid (FOLVITE) 1 MG tablet TAKE ONE TABLET BY MOUTH DAILY   . gabapentin (NEURONTIN) 300 MG capsule Take 1 capsule (300 mg total) by mouth 3 (three) times daily.   Marland Kitchen glucose blood (ACCU-CHEK GUIDE) test strip Use to check blood sugars twice daily E11.69   . insulin  glargine, 1 Unit Dial, (TOUJEO SOLOSTAR) 300 UNIT/ML Solostar Pen Inject 58 Units into the skin at bedtime. (Patient taking differently: Inject 25 Units into the skin at bedtime.)   . insulin lispro (HUMALOG KWIKPEN) 200 UNIT/ML KwikPen 6 units with breakfast, 6 units with lunch, and 8 units with dinner (Patient taking differently: 10 units with breakfast, 10 units with lunch, and 14 units with dinner)   . magnesium oxide (MAG-OX) 400 MG tablet Take 400 mg by mouth daily.   . metoprolol succinate (TOPROL-XL) 25 MG 24 hr tablet Take 1  tablet (25 mg total) by mouth daily.   . Misc Natural Products (ELDERBERRY ZINC/VIT C/IMMUNE MT) Use as directed 1 capsule in the mouth or throat daily at 12 noon.   . Multiple Vitamin (MULTIVITAMIN WITH MINERALS) TABS tablet Take 1 tablet by mouth daily.   . nitroGLYCERIN (NITROSTAT) 0.4 MG SL tablet Place 1 tablet (0.4 mg total) under the tongue every 5 (five) minutes as needed for chest pain.   Marland Kitchen NOVOFINE PEN NEEDLE 32G X 6 MM MISC USE 5 TO 8 NEEDLES PER DAY AS DIRECTED PER INSULIN PROTOCOL   . oxyCODONE (OXY IR/ROXICODONE) 5 MG immediate release tablet Take by mouth.   . pantoprazole (PROTONIX) 40 MG tablet Take 1 tablet (40 mg total) by mouth 2 (two) times daily.   . Pitavastatin Calcium 4 MG TABS Take 1 tablet (4 mg total) by mouth daily.   Marland Kitchen torsemide (DEMADEX) 20 MG tablet Take 1 tablet (20 mg total) by mouth daily.   . vitamin E 180 MG (400 UNITS) capsule Take 400 Units by mouth daily.    No facility-administered encounter medications on file as of 06/07/2020.    Patient Active Problem List   Diagnosis Date Noted  . Sickle cell trait ([place]) 08/18/2018  . Iron deficiency anemia 10/24/2016  . Allergic rhinitis 06/07/2014  . OSA (obstructive sleep apnea) 10/05/2013  . Diabetes mellitus ([place]) 10/05/2013  . Anemia 10/05/2013  . Pulmonary hypertension ([place]) 10/04/2013  . Acute right-sided CHF (congestive heart failure) ([place]) 10/04/2013    Conditions to be addressed/monitored: DMII and Pulmonary Hypertension; Transportation  Care Plan : Social Work [place]  Updates made by Daneen Schick since 06/07/2020 12:00 AM    Problem: Barriers to Treatment     Long-Range Goal: Barriers to Treatment Identified and Managed   Start Date: 06/07/2020  Expected End Date: 10/05/2020  This Visit's Progress: On track  Priority: High  Note:   Current Barriers:  . Chronic disease management support and education needs related to DM and Pulmonary Hypertension   . Transportation .  Social  Worker Clinical Goal(s):  Marland Kitchen Over the next 120 days the patient will work with SW to address transportation barriers  SW Interventions:  . Inter-disciplinary care team collaboration (see longitudinal plan of care) . Collaboration with Glendale Chard, MD regarding development and update of comprehensive plan of care as evidenced by provider attestation and co-signature . Collaboration with RN Care Manager [place] who requests SW intervention to assist with transportation barriers . Determined the patient has dual coverage but is unable to access Medicaid transportation benefit to visit PCP due to distance from patients home in [place] . Collaboration with [employer] to determine if patient is able to access transportation services to PCP through [employer] . Scheduled call to assist patient with resources over the next 14 days  Patient Goals/Self-Care Activities . patient will:   -  Work with BSW to  identify transportation resources Follow Up Plan:  SW will follow up with the patient over the next 14 days       Follow Up Plan: SW will follow up with patient by phone over the next 14 days      Daneen Schick, BSW, CDP Social Worker, Certified Dementia Practitioner [place] [facility] [place] [facility] [phone]  Total time spent performing care coordination and/or care management activities with the patient by phone or face to face = 13 minutes.

## 2020-06-07 NOTE — Chronic Care Management (AMB) (Signed)
Chronic Care Management Pharmacy Assistant   Name: Anna Clay  MRN: [id number] DOB: 1950-08-05  Reason for Encounter: Medication Review/Medication Coordination Call   Recent office visits:  05/22/2020- Orlando Penner, CPP (CCM) 05/25/2020- Daneen Schick, LSW (CCM) 06/06/2020- Dr. Glendale Chard (PCP) Televisit  Recent consult visits:  05/29/2020- Dr. Juel Burrow (Oncology) - [facility] 05/30/2020- Dr. Juel Burrow (Oncology) - [place] [facility] visits:  Medication Reconciliation was completed by comparing discharge summary, patient's EMR and Pharmacy list, and upon discussion with patient.  Admitted to the hospital on 04/13/2020 due to Symptomatic Anemia. Discharge date was 04/16/2020. Discharged from [place]?Medications Started at [facility] Discharge:?? -started None  Medication Changes at Hospital Discharge: -Changed- None  Medications Discontinued at Hospital Discharge: -Stopped Asa 81 mg due to Anemia and GI hemorrhage  Medications that remain the same after Hospital Discharge:??  -All other medications will remain the same.    Patient mentioned that during Hospital stay Pantoprazole 40 mg was increased to twice daily and her Eliquis 5 mg was held for 3 days so they could perform EGD and Colonoscopy.   Reviewed chart for medication changes ahead of medication coordination call.   BP Readings from Last 3 Encounters:  06/06/20 (!) 150/90  04/12/20 138/72  01/30/20 (!) 165/100    Lab Results  Component Value Date   HGBA1C 7.0 01/31/2020     Patient obtains medications through Adherence Packaging  30 Days   Last adherence delivery included:  Metoprolol 25 mg- 1 tablet daily (before breakfast) Torsemide 20 mg- 1 tablet daily (before breakfast) Colchicine 0.6 mg- 1 tablet daily prn (vials) Pantoprazole 40 mg- 1 tablet twice daily( direction change)- (before breakfast, evening meal) Allopurinol 100 mg- 1 tablet  daily(breakfast) Gabapentin 300 mg- 1 capsule three times daily (before breakfast, evening meal, bedtime)  Patient declined the following medications last month:  Freestyle Kit sensor- Use to check blood sugars three times daily   Freestyle Reader- Use to check blood sugars three times daily   Accu-check Guide Glucometer- Use to check blood sugars three times daily  Accu-check Guide Lancets Device- Use to check blood sugars three times daily  Accu-check Guide test strips- Use to check blood sugars three times daily Due to not being covered by insurance- Patient                                  receives [employer] from [employer].   Toujeo- Inject 58 units sq at bedtimedue to having 1 full box with 3 pens on hand.  Amlodipine 5 mg due to being discontinued  Cyclobenzaprine 10 mg- due to no longer taking  Nitroglycerin 0.4 mg due to prn use  Livalo 4 mg due to being discontinued  Epinephrine 0.3 mg/0.3 ml due to prn use  Spironolactone 25 mg due to being discontinued.  Asa 81 mg- due to being discontinued.    Patient is due for next adherence delivery on: 06/14/2020. Patient has a CCM telephone visit with Case Manager today 06/07/2020, will reach out to patient 06/08/2020.  06/08/2020- Called patient and reviewed medications and coordinated delivery.  This delivery to include: Metoprolol 25 mg- 1 tablet daily (before breakfast) Torsemide 20 mg- 1 tablet daily (before breakfast) Pantoprazole 40 mg- 1 tablet twice daily( direction change)- (before breakfast, evening meal) Allopurinol 100 mg- 1 tablet daily(breakfast) Gabapentin 300 mg- 1 capsule  three times daily (before breakfast, evening meal, bedtime) Eliquis 5 mg- 1 tablet twice daiy (breakfast and evening meal) Humalog Kwikpen- Inject 6 units with breakfast, 6 units with lunch, and 8 units with dinner   No short fill or acute fill needed.  Patient declined the  following medications: Colchicine 0.6 mg- 1 tablet daily prn (vials)- Due to PRN use. Freestyle Kit sensor- Use to check blood sugars three times daily  Freestyle Reader- Use to check blood sugars three times daily  Accu-check Guide Glucometer- Use to check blood sugars three times daily Accu-check Guide Lancets Device- Use to check blood sugars three times daily Accu-check Guide test strips- Use to check blood sugars three times daily  Due to not being covered by insurance- Patient receives [employer] from [employer]. Toujeo- Inject 58 units sq at bedtimedue to having 1 full box with 3 pens on hand. Amlodipine 5 mg due to being discontinued Cyclobenzaprine 10 mg- due to no longer taking Nitroglycerin 0.4 mg due to prn use Livalo 4 mg due to being discontinued Epinephrine 0.3 mg/0.3 ml due to prn use Spironolactone 25 mg due to being discontinued. Asa 81 mg- due to being discontinued.   Patient needs refills for Torsemide 20 mg- Request sent to PCP.  Confirmed delivery date of 06/14/2020, advised patient that pharmacy will contact them the morning of delivery.   Medications: Outpatient Encounter Medications as of 06/07/2020  Medication Sig Note  . Accu-Chek FastClix Lancets MISC USE AS DIRECTED TO CHECK BLOOD SUGARS 2 TIMES PER DAY DX:E11.65   . Accu-Chek Softclix Lancets lancets Use to check blood sugars twice daily E11.69   . acetaminophen (TYLENOL) 650 MG CR tablet Take 1,300 mg by mouth every 8 (eight) hours as needed for pain.   Marland Kitchen allopurinol (ZYLOPRIM) 100 MG tablet Take 1 tablet (100 mg total) by mouth daily.   Marland Kitchen aspirin EC 81 MG tablet Take 81 mg by mouth daily. (Patient not taking: Reported on 05/10/2020) 05/10/2020: Discontinued from the last hospital visit.   . Blood Glucose Monitoring Suppl (ACCU-CHEK GUIDE) w/Device KIT USE AS DIRECTED TO CHECK BLOOD SUGARS 2 TIMES PER DAY DX:E11.65   . calcium carbonate (TUMS - DOSED IN MG ELEMENTAL CALCIUM) 500 MG  chewable tablet Chew 2 tablets by mouth daily. 2 per day   . Cholecalciferol (VITAMIN D) 2000 UNITS CAPS Take 1 capsule by mouth daily.   . colchicine 0.6 MG tablet Take 1 tablet (0.6 mg total) by mouth daily.   Marland Kitchen ELIQUIS 5 MG TABS tablet Take 1 tablet (5 mg total) by mouth 2 (two) times daily.   Marland Kitchen EPINEPHrine (EPIPEN 2-PAK) 0.3 mg/0.3 mL IJ SOAJ injection Inject 0.3 mLs (0.3 mg total) into the muscle as needed for anaphylaxis.   . ferrous sulfate 325 (65 FE) MG EC tablet Take 325 mg by mouth daily.   . fexofenadine (ALLEGRA) 180 MG tablet Take 180 mg by mouth daily.   . folic acid (FOLVITE) 1 MG tablet TAKE ONE TABLET BY MOUTH DAILY   . gabapentin (NEURONTIN) 300 MG capsule Take 1 capsule (300 mg total) by mouth 3 (three) times daily.   Marland Kitchen glucose blood (ACCU-CHEK GUIDE) test strip Use to check blood sugars twice daily E11.69   . insulin glargine, 1 Unit Dial, (TOUJEO SOLOSTAR) 300 UNIT/ML Solostar Pen Inject 58 Units into the skin at bedtime. (Patient taking differently: Inject 25 Units into the skin at bedtime.)   . insulin lispro (HUMALOG KWIKPEN) 200 UNIT/ML KwikPen 6  units with breakfast, 6 units with lunch, and 8 units with dinner (Patient taking differently: 10 units with breakfast, 10 units with lunch, and 14 units with dinner)   . magnesium oxide (MAG-OX) 400 MG tablet Take 400 mg by mouth daily.   . metoprolol succinate (TOPROL-XL) 25 MG 24 hr tablet Take 1 tablet (25 mg total) by mouth daily.   . Misc Natural Products (ELDERBERRY ZINC/VIT C/IMMUNE MT) Use as directed 1 capsule in the mouth or throat daily at 12 noon.   . Multiple Vitamin (MULTIVITAMIN WITH MINERALS) TABS tablet Take 1 tablet by mouth daily.   . nitroGLYCERIN (NITROSTAT) 0.4 MG SL tablet Place 1 tablet (0.4 mg total) under the tongue every 5 (five) minutes as needed for chest pain.   Marland Kitchen NOVOFINE PEN NEEDLE 32G X 6 MM MISC USE 5 TO 8 NEEDLES PER DAY AS DIRECTED PER INSULIN PROTOCOL   . oxyCODONE (OXY IR/ROXICODONE) 5 MG  immediate release tablet Take by mouth.   . pantoprazole (PROTONIX) 40 MG tablet Take 1 tablet (40 mg total) by mouth 2 (two) times daily.   . Pitavastatin Calcium 4 MG TABS Take 1 tablet (4 mg total) by mouth daily.   Marland Kitchen torsemide (DEMADEX) 20 MG tablet Take 1 tablet (20 mg total) by mouth daily.   . vitamin E 180 MG (400 UNITS) capsule Take 400 Units by mouth daily.    No facility-administered encounter medications on file as of 06/07/2020.    Star Rating Drugs: Atorvastatin 20 mg- Last filled 11/02/2019 for 30 days supply Pitavastatin 4 mg- Last filled 06/17/2019 for 63 day supply  Notes: Patient checks her blood pressures daily- recent reading 141/94, patient admits to eating hotdogs the night before. Patient also checks blood sugars using her Freestyle Libre device, recent reading 131 fasting.  Orlando Penner, CPP notified.    SIG: Pattricia Boss, [place] Pharmacist Assistant [phone]

## 2020-06-07 NOTE — Patient Instructions (Signed)
Social Worker Visit Information  Goals we discussed today:  Goals Addressed            This Visit's Progress   . Barriers to Treatment Identified and Managed       Timeframe:  Long-Range Goal Priority:  High Start Date:  5.19.22                           Expected End Date:   9.16.22                    Next planned outreach: 5.31.22  Patient Goals/Self-Care Activities . patient will:   - Work with BSW to identify transportation resources        Follow Up Plan: SW will follow up with patient by phone over the next 14 days   Daneen Schick, BSW, CDP Social Worker, Certified Dementia Practitioner [place] [facility] [place] [facility] [phone]

## 2020-06-07 NOTE — Patient Instructions (Signed)
Goals Addressed    . Barriers to Treatment Identified and Managed   On track    Timeframe:  Long-Range Goal Priority:  High Start Date:  5.19.22                           Expected End Date:   9.16.22                    Next planned outreach: 5.31.22  Patient Goals/Self-Care Activities . patient will:   - Work with BSW to identify transportation resources    . Diabetes treatment optimized       Timeframe:  Long-Range Goal Priority:  High Start Date:  02/27/20                           Expected End Date:  02/26/21  Next Follow Up Date: 08/07/20  . Continue to monitor blood sugars and record as discussed . Continue to follow dietary and exercise recommendations . Patient will self administer medications as prescribed . Patient will attend all scheduled provider appointments . Patient will call pharmacy for medication refills . Patient will call provider office for new concerns or questions                         . Iron Deficiency Anemia - complications minimized or prevented   On track    Timeframe:  Long-Range Goal Priority:  High Start Date:  06/07/20                           Expected End Date:  06/07/21     Next Scheduled Follow up date: 08/07/20      Self Care Activities:  . Continue to keep all scheduled follow up appointments . Take medications as directed  . Let your healthcare team know if you are unable to take your medications . Call your pharmacy for refills at least 7 days prior to running out of medication . Notify your healthcare team promptly to report symptoms suggestive of worsening Anemia as discussed  Patient Goals: - prevent complications from Anemia

## 2020-06-07 NOTE — Chronic Care Management (AMB) (Signed)
Chronic Care Management   CCM RN Visit Note  06/07/2020 Name: Anna Clay MRN: [id number] DOB: 02/07/1950  Subjective: Anna Clay is a 70 y.o. year old female who is a primary care patient of Glendale Chard, MD. The care management team was consulted for assistance with disease management and care coordination needs.    Engaged with patient by telephone for follow up visit in response to provider referral for case management and/or care coordination services.   Consent to Services:  The patient was given information about Chronic Care Management services, agreed to services, and gave verbal consent prior to initiation of services.  Please see initial visit note for detailed documentation.   Patient agreed to services and verbal consent obtained.   Assessment: Review of patient past medical history, allergies, medications, health status, including review of consultants reports, laboratory and other test data, was performed as part of comprehensive evaluation and provision of chronic care management services.   SDOH (Social Determinants of Health) assessments and interventions performed:  Yes, see care plan   CCM Care Plan  Allergies  Allergen Reactions  . Allspice [Pimenta] Hives and Shortness Of Breath  . Black Cohosh Hives and Shortness Of Breath  . Peach [Prunus Persica] Hives and Shortness Of Breath    Fresh peaches  . Heparin Other (See Comments)    Blood clots  . Ivp Dye [Iodinated Diagnostic Agents] Other (See Comments)    Reaction unknown  . Tetracyclines & Related Other (See Comments)    Reaction unknown    Outpatient Encounter Medications as of 06/07/2020  Medication Sig Note  . Accu-Chek FastClix Lancets MISC USE AS DIRECTED TO CHECK BLOOD SUGARS 2 TIMES PER DAY DX:E11.65   . Accu-Chek Softclix Lancets lancets Use to check blood sugars twice daily E11.69   . acetaminophen (TYLENOL) 650 MG CR tablet Take 1,300 mg by mouth every 8 (eight) hours as needed for pain.    Marland Kitchen allopurinol (ZYLOPRIM) 100 MG tablet Take 1 tablet (100 mg total) by mouth daily.   Marland Kitchen aspirin EC 81 MG tablet Take 81 mg by mouth daily. (Patient not taking: Reported on 05/10/2020) 05/10/2020: Discontinued from the last hospital visit.   . Blood Glucose Monitoring Suppl (ACCU-CHEK GUIDE) w/Device KIT USE AS DIRECTED TO CHECK BLOOD SUGARS 2 TIMES PER DAY DX:E11.65   . calcium carbonate (TUMS - DOSED IN MG ELEMENTAL CALCIUM) 500 MG chewable tablet Chew 2 tablets by mouth daily. 2 per day   . Cholecalciferol (VITAMIN D) 2000 UNITS CAPS Take 1 capsule by mouth daily.   . colchicine 0.6 MG tablet Take 1 tablet (0.6 mg total) by mouth daily.   Marland Kitchen ELIQUIS 5 MG TABS tablet Take 1 tablet (5 mg total) by mouth 2 (two) times daily.   Marland Kitchen EPINEPHrine (EPIPEN 2-PAK) 0.3 mg/0.3 mL IJ SOAJ injection Inject 0.3 mLs (0.3 mg total) into the muscle as needed for anaphylaxis.   . ferrous sulfate 325 (65 FE) MG EC tablet Take 325 mg by mouth daily.   . fexofenadine (ALLEGRA) 180 MG tablet Take 180 mg by mouth daily.   . folic acid (FOLVITE) 1 MG tablet TAKE ONE TABLET BY MOUTH DAILY   . gabapentin (NEURONTIN) 300 MG capsule Take 1 capsule (300 mg total) by mouth 3 (three) times daily.   Marland Kitchen glucose blood (ACCU-CHEK GUIDE) test strip Use to check blood sugars twice daily E11.69   . insulin glargine, 1 Unit Dial, (TOUJEO SOLOSTAR) 300 UNIT/ML Solostar Pen Inject 58 Units into  the skin at bedtime. (Patient taking differently: Inject 25 Units into the skin at bedtime.)   . insulin lispro (HUMALOG KWIKPEN) 200 UNIT/ML KwikPen 6 units with breakfast, 6 units with lunch, and 8 units with dinner (Patient taking differently: 10 units with breakfast, 10 units with lunch, and 14 units with dinner)   . magnesium oxide (MAG-OX) 400 MG tablet Take 400 mg by mouth daily.   . metoprolol succinate (TOPROL-XL) 25 MG 24 hr tablet Take 1 tablet (25 mg total) by mouth daily.   . Misc Natural Products (ELDERBERRY ZINC/VIT C/IMMUNE MT) Use as  directed 1 capsule in the mouth or throat daily at 12 noon.   . Multiple Vitamin (MULTIVITAMIN WITH MINERALS) TABS tablet Take 1 tablet by mouth daily.   . nitroGLYCERIN (NITROSTAT) 0.4 MG SL tablet Place 1 tablet (0.4 mg total) under the tongue every 5 (five) minutes as needed for chest pain.   Marland Kitchen NOVOFINE PEN NEEDLE 32G X 6 MM MISC USE 5 TO 8 NEEDLES PER DAY AS DIRECTED PER INSULIN PROTOCOL   . oxyCODONE (OXY IR/ROXICODONE) 5 MG immediate release tablet Take by mouth.   . pantoprazole (PROTONIX) 40 MG tablet Take 1 tablet (40 mg total) by mouth 2 (two) times daily.   . Pitavastatin Calcium 4 MG TABS Take 1 tablet (4 mg total) by mouth daily.   Marland Kitchen torsemide (DEMADEX) 20 MG tablet Take 1 tablet (20 mg total) by mouth daily.   . vitamin E 180 MG (400 UNITS) capsule Take 400 Units by mouth daily.    No facility-administered encounter medications on file as of 06/07/2020.    Patient Active Problem List   Diagnosis Date Noted  . Sickle cell trait ([place]) 08/18/2018  . Iron deficiency anemia 10/24/2016  . Allergic rhinitis 06/07/2014  . OSA (obstructive sleep apnea) 10/05/2013  . Diabetes mellitus ([place]) 10/05/2013  . Anemia 10/05/2013  . Pulmonary hypertension ([place]) 10/04/2013  . Acute right-sided CHF (congestive heart failure) ([place]) 10/04/2013    Conditions to be addressed/monitored:DM II, Pulmonary Hypertension, OSA, HLD, Iron Deficiency Anemia    Care Plan : Diabetes Type 2 (Adult)  Updates made by Lynne Logan, RN since 06/07/2020 12:00 AM    Problem: Glycemic Management (Diabetes, Type 2)   Priority: High    Long-Range Goal: Glycemic Management Optimized   Start Date: 02/27/2020  Expected End Date: 02/26/2021  Recent Progress: On track  Priority: High  Note:   Current Barriers:  Marland Kitchen Knowledge Deficits related to Self health management of Diabetes . Chronic Disease Management support and education needs related to DM II, Pulmonary Hypertension, OSA  Nurse Case Manager Clinical  Goal(s):  Marland Kitchen Patient will continue to engage with the CCM team and PCP for ongoing support and disease education to help improve her Self Health management of her DM as evidence patient will lower her A1c <7.0 CCM RN CM Interventions:  06/07/20 completed successful outbound call with patient  . Evaluation of current treatment plan related to Diabetes and patient's adherence to plan as established by provider . Reviewed patient's current A1c of 7.0; Educated on target A1c, daily glycemic control FBS 80-130, <180 after meals; Educated on 15'15' rule . Determined patient is Self monitoring her blood sugars at home, with average in the low 100's, although she did experience one low reading and several high readings after eating a light dinner one evening and splurging on fresh water melon . Educated patient on dietary and exercise recommendations . Educated with rationale importance of  ongoing CBG monitoring daily before meals and at bedtime, and to notify the CCM team and or PCP for abnormal results . Discussed plans with patient for ongoing care management follow up and provided patient with direct contact information for care management team Patient Self Care Activities:  . Continue to monitor blood sugars and record as discussed . Continue to follow dietary and exercise recommendations . Patient will self administer medications as prescribed . Patient will attend all scheduled provider appointments . Patient will call pharmacy for medication refills . Patient will call provider office for new concerns or questions Next Follow up date: 08/07/20    Care Plan : Iron Deficiency Anemia  Updates made by Lynne Logan, RN since 06/07/2020 12:00 AM    Problem: Iron Deficiency Anemia   Priority: High    Long-Range Goal: Iron Deficiency Anemia - complications minimized or prevented   Start Date: 06/07/2020  Expected End Date: 06/07/2021  This Visit's Progress: On track  Priority: High  Note:    Current Barriers:   Ineffective Self Health Maintenance  Clinical Goal(s):  Marland Kitchen Collaboration with Glendale Chard, MD regarding development and update of comprehensive plan of care as evidenced by provider attestation and co-signature . Inter-disciplinary care team collaboration (see longitudinal plan of care)  patient will work with care management team to address care coordination and chronic disease management needs related to Disease Management  Educational Needs  Care Coordination  Medication Management and Education  Psychosocial Support   Interventions:  06/07/20 completed successful outbound call with patient   Evaluation of current treatment plan related to  Iron Deficiency Anemia  , self-management and patient's adherence to plan as established by provider.  Collaboration with Glendale Chard, MD regarding development and update of comprehensive plan of care as evidenced by provider attestation       and co-signature . Inter-disciplinary care team collaboration (see longitudinal plan of care) . Provided education to patient about basic disease process related to Anemia  . Review of patient status, including review of consultant's reports, relevant laboratory and other test results, and medications completed. . Reviewed medications with patient and discussed importance of medication adherence . Determined Dr. Juel Burrow with [facility] Oncologist is managing patient's Anemia . Discussed and reviewed his recommendations for Anemia management including oral supplementation, blood transfusions and iron infusions when needed, patient verbalizes understanding and reports adherence . Discussed next scheduled lab check for iron panel is scheduled at [facility] in 1 month   Discussed plans with patient for ongoing care management follow up and provided patient with direct contact information for care management team Self Care Activities:  . Continue to keep all scheduled follow up  appointments . Take medications as directed  . Let your healthcare team know if you are unable to take your medications . Call your pharmacy for refills at least 7 days prior to running out of medication . Notify your healthcare team promptly to report symptoms suggestive of worsening Anemia as discussed  Patient Goals: - prevent complications from Anemia   Follow Up Plan: Telephone follow up appointment with care management team member scheduled for: 08/07/20     Plan:Telephone follow up appointment with care management team member scheduled for:  08/07/20  [name], RN, BSN, CCM Care Management Coordinator [place] [facility]  Direct Phone: [phone]

## 2020-06-11 ENCOUNTER — Other Ambulatory Visit: Payer: Self-pay

## 2020-06-11 MED ORDER — TORSEMIDE 20 MG PO TABS: 20.0000 mg | ORAL_TABLET | Freq: Every day | ORAL | 1 refills | Status: DC

## 2020-06-18 DIAGNOSIS — K8020 Calculus of gallbladder without cholecystitis without obstruction: Secondary | ICD-10-CM | POA: Insufficient documentation

## 2020-06-18 DIAGNOSIS — I129 Hypertensive chronic kidney disease with stage 1 through stage 4 chronic kidney disease, or unspecified chronic kidney disease: Secondary | ICD-10-CM | POA: Insufficient documentation

## 2020-06-18 DIAGNOSIS — E041 Nontoxic single thyroid nodule: Secondary | ICD-10-CM | POA: Insufficient documentation

## 2020-06-19 ENCOUNTER — Ambulatory Visit: Payer: Medicare Other

## 2020-06-19 DIAGNOSIS — D508 Other iron deficiency anemias: Secondary | ICD-10-CM | POA: Diagnosis not present

## 2020-06-19 DIAGNOSIS — N1831 Chronic kidney disease, stage 3a: Secondary | ICD-10-CM | POA: Diagnosis not present

## 2020-06-19 DIAGNOSIS — Z794 Long term (current) use of insulin: Secondary | ICD-10-CM

## 2020-06-19 DIAGNOSIS — I272 Pulmonary hypertension, unspecified: Secondary | ICD-10-CM

## 2020-06-19 DIAGNOSIS — E782 Mixed hyperlipidemia: Secondary | ICD-10-CM | POA: Diagnosis not present

## 2020-06-19 DIAGNOSIS — E1122 Type 2 diabetes mellitus with diabetic chronic kidney disease: Secondary | ICD-10-CM

## 2020-06-19 NOTE — Chronic Care Management (AMB) (Signed)
Chronic Care Management    Social Work Note  06/19/2020 Name: Anna Clay MRN: [id number] DOB: 11/04/1950  Anna Clay is a 70 y.o. year old female who is a primary care patient of Glendale Chard, MD. The CCM team was consulted to assist the patient with chronic disease management and/or care coordination needs related to: Transportation Needs .   Engaged with patient by telephone for follow up visit in response to provider referral for social work chronic care management and care coordination services.   Consent to Services:  The patient was given information about Chronic Care Management services, agreed to services, and gave verbal consent prior to initiation of services.  Please see initial visit note for detailed documentation.   Patient agreed to services and consent obtained.   Assessment: Review of patient past medical history, allergies, medications, and health status, including review of relevant consultants reports was performed today as part of a comprehensive evaluation and provision of chronic care management and care coordination services.     SDOH (Social Determinants of Health) assessments and interventions performed:    Advanced Directives Status: Not addressed in this encounter.  CCM Care Plan  Allergies  Allergen Reactions  . Allspice [Pimenta] Hives and Shortness Of Breath  . Black Cohosh Hives and Shortness Of Breath  . Peach [Prunus Persica] Hives and Shortness Of Breath    Fresh peaches  . Heparin Other (See Comments)    Blood clots  . Ivp Dye [Iodinated Diagnostic Agents] Other (See Comments)    Reaction unknown  . Tetracyclines & Related Other (See Comments)    Reaction unknown    Outpatient Encounter Medications as of 06/19/2020  Medication Sig Note  . Accu-Chek FastClix Lancets MISC USE AS DIRECTED TO CHECK BLOOD SUGARS 2 TIMES PER DAY DX:E11.65   . Accu-Chek Softclix Lancets lancets Use to check blood sugars twice daily E11.69   . acetaminophen  (TYLENOL) 650 MG CR tablet Take 1,300 mg by mouth every 8 (eight) hours as needed for pain.   Marland Kitchen allopurinol (ZYLOPRIM) 100 MG tablet Take 1 tablet (100 mg total) by mouth daily.   Marland Kitchen aspirin EC 81 MG tablet Take 81 mg by mouth daily. (Patient not taking: Reported on 05/10/2020) 05/10/2020: Discontinued from the last hospital visit.   . Blood Glucose Monitoring Suppl (ACCU-CHEK GUIDE) w/Device KIT USE AS DIRECTED TO CHECK BLOOD SUGARS 2 TIMES PER DAY DX:E11.65   . calcium carbonate (TUMS - DOSED IN MG ELEMENTAL CALCIUM) 500 MG chewable tablet Chew 2 tablets by mouth daily. 2 per day   . Cholecalciferol (VITAMIN D) 2000 UNITS CAPS Take 1 capsule by mouth daily.   . colchicine 0.6 MG tablet Take 1 tablet (0.6 mg total) by mouth daily.   Marland Kitchen ELIQUIS 5 MG TABS tablet Take 1 tablet (5 mg total) by mouth 2 (two) times daily.   Marland Kitchen EPINEPHrine (EPIPEN 2-PAK) 0.3 mg/0.3 mL IJ SOAJ injection Inject 0.3 mLs (0.3 mg total) into the muscle as needed for anaphylaxis.   . ferrous sulfate 325 (65 FE) MG EC tablet Take 325 mg by mouth daily.   . fexofenadine (ALLEGRA) 180 MG tablet Take 180 mg by mouth daily.   . folic acid (FOLVITE) 1 MG tablet TAKE ONE TABLET BY MOUTH DAILY   . gabapentin (NEURONTIN) 300 MG capsule Take 1 capsule (300 mg total) by mouth 3 (three) times daily.   Marland Kitchen glucose blood (ACCU-CHEK GUIDE) test strip Use to check blood sugars twice daily E11.69   .  insulin glargine, 1 Unit Dial, (TOUJEO SOLOSTAR) 300 UNIT/ML Solostar Pen Inject 58 Units into the skin at bedtime. (Patient taking differently: Inject 25 Units into the skin at bedtime.)   . insulin lispro (HUMALOG KWIKPEN) 200 UNIT/ML KwikPen 6 units with breakfast, 6 units with lunch, and 8 units with dinner (Patient taking differently: 10 units with breakfast, 10 units with lunch, and 14 units with dinner)   . magnesium oxide (MAG-OX) 400 MG tablet Take 400 mg by mouth daily.   . metoprolol succinate (TOPROL-XL) 25 MG 24 hr tablet Take 1 tablet (25 mg  total) by mouth daily.   . Misc Natural Products (ELDERBERRY ZINC/VIT C/IMMUNE MT) Use as directed 1 capsule in the mouth or throat daily at 12 noon.   . Multiple Vitamin (MULTIVITAMIN WITH MINERALS) TABS tablet Take 1 tablet by mouth daily.   . nitroGLYCERIN (NITROSTAT) 0.4 MG SL tablet Place 1 tablet (0.4 mg total) under the tongue every 5 (five) minutes as needed for chest pain.   Marland Kitchen NOVOFINE PEN NEEDLE 32G X 6 MM MISC USE 5 TO 8 NEEDLES PER DAY AS DIRECTED PER INSULIN PROTOCOL   . oxyCODONE (OXY IR/ROXICODONE) 5 MG immediate release tablet Take by mouth.   . pantoprazole (PROTONIX) 40 MG tablet Take 1 tablet (40 mg total) by mouth 2 (two) times daily.   . Pitavastatin Calcium 4 MG TABS Take 1 tablet (4 mg total) by mouth daily.   Marland Kitchen torsemide (DEMADEX) 20 MG tablet Take 1 tablet (20 mg total) by mouth daily.   . vitamin E 180 MG (400 UNITS) capsule Take 400 Units by mouth daily.    No facility-administered encounter medications on file as of 06/19/2020.    Patient Active Problem List   Diagnosis Date Noted  . 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 Thyroid nodule 06/18/2020  . Gallstones 06/18/2020  . Sickle cell trait ([place]) 08/18/2018  . Iron deficiency anemia 10/24/2016  . Allergic rhinitis 06/07/2014  . OSA (obstructive sleep apnea) 10/05/2013  . Diabetes mellitus ([place]) 10/05/2013  . Anemia 10/05/2013  . Pulmonary hypertension ([place]) 10/04/2013  . Acute right-sided CHF (congestive heart failure) ([place]) 10/04/2013    Conditions to be addressed/monitored: DMII and Pulmonary Hypertension; Transportation  Care Plan : Social Work [place]  Updates made by Daneen Schick since 06/19/2020 12:00 AM    Problem: Barriers to Treatment     Long-Range Goal: Barriers to Treatment Identified and Managed   Start Date: 06/07/2020  Expected End Date: 10/05/2020  This Visit's Progress: On track  Recent Progress: On track  Priority: High  Note:   Current Barriers:  . Chronic disease  management support and education needs related to DM and Pulmonary Hypertension   . Transportation .  Social Worker Clinical Goal(s):  . work with SW to address transportation barriers in order for patient to attend physician appointments  SW Interventions:  . Inter-disciplinary care team collaboration (see longitudinal plan of care) . Collaboration with Glendale Chard, MD regarding development and update of comprehensive plan of care as evidenced by provider attestation and co-signature . Successful outbound call placed to the patient to review transportation resource needs . Discussed the patient does have [facility] transportation to assist with in county transportation needs - this service will not transport to [place] unless it is to a provider that offers a type of service not offered in [place] . Advised the patient SW had collaborated with [facility] [employer] to determine they will provide transportation assistance to  allow the patient to attend visits with Dr. Baird Cancer . Provided education on the [employer]  . Performed chart review to note the patient does not have an [place] office visit scheduled at this time . Patient reports she had plans to switch to a new provider in August who was closer to her home but she would much rather continue seeing Dr. Baird Cancer as she has been the patients primary provider for several years . Discussed plans for the patient to contact SW when she scheduled an office visit to see Dr. Baird Cancer to allow SW to assist with referral to [facility] . Scheduled follow up call over the next 90 days . Collaboration with Dr. Baird Cancer to advise of transportation resource available to assist with future appointments  Patient Goals/Self-Care Activities . patient will:   -  Contact SW to arrange transportation upon scheduling an office visit with Dr. Baird Cancer  Follow Up Plan:  SW will follow up with the patient over the next 90  days       Follow Up Plan: SW will follow up with patient by phone over the next 90 days.      Daneen Schick, BSW, CDP Social Worker, Certified Dementia Practitioner [place] [facility] [place] [facility] [phone]  Total time spent performing care coordination and/or care management activities with the patient by phone or face to face = 29 minutes.

## 2020-06-19 NOTE — Patient Instructions (Signed)
Social Worker Visit Information  Goals we discussed today:  Goals Addressed            This Visit's Progress   . Barriers to Treatment Identified and Managed   On track    Timeframe:  Long-Range Goal Priority:  High Start Date:  5.19.22                           Expected End Date:   9.16.22                    Next planned outreach: 8.30.22  Patient Goals/Self-Care Activities . patient will:   - Contact SW to arrange transportation upon scheduling an office visit with Dr. Baird Cancer        Materials Provided: Verbal education about transportation resources provided by phone  Follow Up Plan: SW will follow up with patient by phone over the next 90 days.   Daneen Schick, BSW, CDP Social Worker, Certified Dementia Practitioner [place] [facility] [place] [facility] [phone]

## 2020-06-20 ENCOUNTER — Telehealth: Payer: Self-pay

## 2020-06-20 NOTE — Chronic Care Management (AMB) (Signed)
  No answer, left message of telephone appointment with Orlando Penner [facility] on 06-21-2020 at 10:30. Left message to have all medications, supplements, blood pressure and/or blood sugar logs available during appointment and to return call if need to reschedule.   [place] Pharmacist Assistant [phone]

## 2020-06-21 ENCOUNTER — Ambulatory Visit (INDEPENDENT_AMBULATORY_CARE_PROVIDER_SITE_OTHER): Payer: Medicare Other

## 2020-06-21 DIAGNOSIS — Z794 Long term (current) use of insulin: Secondary | ICD-10-CM

## 2020-06-21 DIAGNOSIS — I10 Essential (primary) hypertension: Secondary | ICD-10-CM

## 2020-06-21 DIAGNOSIS — E1122 Type 2 diabetes mellitus with diabetic chronic kidney disease: Secondary | ICD-10-CM | POA: Diagnosis not present

## 2020-06-21 DIAGNOSIS — E782 Mixed hyperlipidemia: Secondary | ICD-10-CM | POA: Diagnosis not present

## 2020-06-21 DIAGNOSIS — N1831 Chronic kidney disease, stage 3a: Secondary | ICD-10-CM | POA: Diagnosis not present

## 2020-06-21 NOTE — Progress Notes (Signed)
Chronic Care Management Pharmacy Note  07/03/2020 Name:  Anna Clay MRN:  [id number] DOB:  1950-02-25  Summary: Patient discussed concerns about transportation issues. Vaccinations gaps were discussed and diet and lifestyle changes.    Recommendations/Changes made from today's visit: Recommend patient begin using silver sneakers youtube site online to do exerrcise Recommedn patient receive COVID -23 second booster  Plan: Collaborate with CCM SW to help with transportation issues. Patient to receive booster dose at [facility] Patient to start using silver sneakers progran to exercise at least once per week.   Subjective: Anna Clay is an 70 y.o. year old female who is a primary patient of Glendale Chard, MD.  The CCM team was consulted for assistance with disease management and care coordination needs.  Patient was supposed to have an ultrasound for her thyroid and the transportation people did not show up. This was really upsetting to her. She reports they said they were headed her way, at 10:45 she called again, and then at 90, 11:20 she called again and they told her she was still in [place], [place]. She missed the ultrasound of her thyroid.   Engaged with patient by telephone for follow up visit in response to provider referral for pharmacy case management and/or care coordination services.   Consent to Services:  The patient was given information about Chronic Care Management services, agreed to services, and gave verbal consent prior to initiation of services.  Please see initial visit note for detailed documentation.   Patient Care Team: Glendale Chard, MD as PCP - General (Internal Medicine) Lynne Logan, RN as Case Manager  Recent office visits: 04/12/2020 PCP OV  01/03/2021 PCP OV   Recent consult visits: 05/21/2020 GI consult    Hospital visits: 04/13/2020 Anemia hospital admission    Objective:  Lab Results  Component Value Date   CREATININE 1.59 (H)  04/12/2020   BUN 26 04/12/2020   GFRNONAA 40 01/31/2020   GFRAA 46 01/31/2020   NA 138 04/12/2020   K 4.5 04/12/2020   CALCIUM 9.6 04/12/2020   CO2 21 04/12/2020   GLUCOSE 103 (H) 04/12/2020    Lab Results  Component Value Date/Time   HGBA1C 7.0 01/31/2020 12:00 AM   HGBA1C 6.9 (H) 04/06/2019 05:07 PM   HGBA1C 7.0 (H) 12/29/2018 04:49 PM   FRUCTOSAMINE 277 08/18/2018 10:33 AM   MICROALBUR 150m 04/12/2020 04:09 PM   MICROALBUR 150 04/06/2019 04:18 PM    Last diabetic Eye exam: No results found for: HMDIABEYEEXA  Last diabetic Foot exam: No results found for: HMDIABFOOTEX   Lab Results  Component Value Date   CHOL 180 01/31/2020   HDL 45 01/31/2020   LDLCALC 111 01/31/2020   TRIG 135 01/31/2020   CHOLHDL 3.9 04/06/2019    Hepatic Function Latest Ref Rng & Units 04/12/2020 01/31/2020 04/06/2019  Total Protein 6.0 - 8.5 g/dL 7.1 - 7.3  Albumin 3.5 - 5.0 - 4.0 3.8  AST 13 - 35 - 15 18  ALT 7 - 35 - 20 16  Alk Phosphatase 25 - 125 - 110 110  Total Bilirubin 0.0 - 1.2 mg/dL - - 0.2    Lab Results  Component Value Date/Time   TSH 3.490 10/06/2013 03:18 AM    CBC Latest Ref Rng & Units 04/12/2020 12/29/2018 12/05/2016  WBC 3.4 - 10.8 x10E3/uL 10.5 10.7 8.7  Hemoglobin 11.1 - 15.9 g/dL 5.6(LL) 13.5 12.5  Hematocrit 34.0 - 46.6 % 21.6(L) 46.5 39.3  Platelets 150 - 450  x10E3/uL 305 289 211    No results found for: VD25OH  Clinical ASCVD: No  The 10-year ASCVD risk score Mikey Bussing DC Jr., et al., 2013) is: 27.6%*   Values used to calculate the score:     Age: 70 years     Sex: Female     Is Non-Hispanic African American: Yes     Diabetic: Yes     Tobacco smoker: No     Systolic Blood Pressure: 409 mmHg     Is BP treated: Yes     HDL Cholesterol: 45 mg/dL*     Total Cholesterol: 180 mg/dL*     * - Cholesterol units were assumed for this score calculation    Depression screen [facility] 2/9 04/12/2020 04/06/2019 05/25/2018  Decreased Interest 0 0 0  Down, Depressed, Hopeless 0 0  0  PHQ - 2 Score 0 0 0  Altered sleeping - 0 -  Tired, decreased energy - 0 -  Change in appetite - 0 -  Feeling bad or failure about yourself  - 0 -  Trouble concentrating - 0 -  Moving slowly or fidgety/restless - 0 -  Suicidal thoughts - 0 -  PHQ-9 Score - 0 -  Difficult doing work/chores - Not difficult at all -     Social History   Tobacco Use  Smoking Status Former   Packs/day: 1.00   Years: 43.00   Pack years: 43.00   Types: Cigarettes   Quit date: 09/20/2013   Years since quitting: 6.7  Smokeless Tobacco Never  Tobacco Comments   she has since quit   BP Readings from Last 3 Encounters:  06/06/20 (!) 150/90  04/12/20 138/72  01/30/20 (!) 165/100   Pulse Readings from Last 3 Encounters:  06/06/20 86  04/12/20 89  01/30/20 96   Wt Readings from Last 3 Encounters:  06/06/20 242 lb (109.8 kg)  04/12/20 246 lb 3.2 oz (111.7 kg)  01/30/20 235 lb (106.6 kg)   BMI Readings from Last 3 Encounters:  06/06/20 46.33 kg/m  04/12/20 47.14 kg/m  01/30/20 44.99 kg/m    Assessment/Interventions: Review of patient past medical history, allergies, medications, health status, including review of consultants reports, laboratory and other test data, was performed as part of comprehensive evaluation and provision of chronic care management services.   SDOH:  (Social Determinants of Health) assessments and interventions performed: No  SDOH Screenings   Alcohol Screen: Not on file  Depression (PHQ2-9): Low Risk    PHQ-2 Score: 0  Financial Resource Strain: Not on file  Food Insecurity: Not on file  Housing: Not on file  Physical Activity: Not on file  Social Connections: Not on file  Stress: Not on file  Tobacco Use: Medium Risk   Smoking Tobacco Use: Former   Smokeless Tobacco Use: Never  Transportation Needs: Not on file    [place]  Allergies  Allergen Reactions   Allspice [Pimenta] Hives and Shortness Of Breath   Black Cohosh Hives and Shortness Of  Breath   Peach [Prunus Persica] Hives and Shortness Of Breath    Fresh peaches   Heparin Other (See Comments)    Blood clots   Ivp Dye [Iodinated Diagnostic Agents] Other (See Comments)    Reaction unknown   Tetracyclines & Related Other (See Comments)    Reaction unknown    Medications Reviewed Today     Reviewed by Mayford Knife, RPH (Pharmacist) on 06/21/20 at 1105  Med List Status: <None>  Medication Order Taking? Sig Documenting Provider Last Dose Status Informant  Accu-Chek FastClix Lancets MISC [id number] No USE AS DIRECTED TO CHECK BLOOD SUGARS 2 TIMES PER DAY DX:E11.65 Glendale Chard, MD Taking Active   Accu-Chek Softclix Lancets lancets [id number] No Use to check blood sugars twice daily E11.69 Glendale Chard, MD Taking Active   acetaminophen (TYLENOL) 650 MG CR tablet [id number] No Take 1,300 mg by mouth every 8 (eight) hours as needed for pain. [provider] Taking Active Self  allopurinol (ZYLOPRIM) 100 MG tablet [id number]  Take 1 tablet (100 mg total) by mouth daily. Glendale Chard, MD  Active   aspirin EC 81 MG tablet [id number] No Take 81 mg by mouth daily.  Patient not taking: Reported on 05/10/2020   [provider] Not Taking Active Self           Med Note Pricilla Holm May 10, 2020 11:40 AM) Discontinued from the last hospital visit.   Blood Glucose Monitoring Suppl (ACCU-CHEK GUIDE) w/Device KIT [id number] No USE AS DIRECTED TO CHECK BLOOD SUGARS 2 TIMES PER DAY DX:E11.65 Glendale Chard, MD Taking Active   calcium carbonate (TUMS - DOSED IN MG ELEMENTAL CALCIUM) 500 MG chewable tablet [id number] No Chew 2 tablets by mouth daily. 2 per day [provider] Taking Active Self  Cholecalciferol (VITAMIN D) 2000 UNITS CAPS [id number] No Take 1 capsule by mouth daily. [provider] Taking Active Self  colchicine 0.6 MG tablet [id number] No Take 1 tablet (0.6 mg total) by mouth daily. Glendale Chard, MD Taking Active   ELIQUIS 5  MG TABS tablet [id number] No Take 1 tablet (5 mg total) by mouth 2 (two) times daily. Glendale Chard, MD Taking Active   EPINEPHrine (EPIPEN 2-PAK) 0.3 mg/0.3 mL IJ SOAJ injection [id number] No Inject 0.3 mLs (0.3 mg total) into the muscle as needed for anaphylaxis. Glendale Chard, MD Taking Active   ferrous sulfate 325 (65 FE) MG EC tablet [id number] No Take 325 mg by mouth daily. [provider] Taking Active Self  fexofenadine (ALLEGRA) 180 MG tablet [id number] No Take 180 mg by mouth daily. [provider] Taking Active   folic acid (FOLVITE) 1 MG tablet [id number] No TAKE ONE TABLET BY MOUTH DAILY [place], Holli Humbles, NP Taking Active   gabapentin (NEURONTIN) 300 MG capsule [id number] No Take 1 capsule (300 mg total) by mouth 3 (three) times daily. Glendale Chard, MD Taking Active   glucose blood (ACCU-CHEK GUIDE) test strip [id number] No Use to check blood sugars twice daily E11.69 Glendale Chard, MD Taking Active   insulin glargine, 1 Unit Dial, (TOUJEO SOLOSTAR) 300 UNIT/ML Solostar Pen [id number] No Inject 58 Units into the skin at bedtime.  Patient taking differently: Inject 25 Units into the skin at bedtime.   Glendale Chard, MD Taking Active   insulin lispro (HUMALOG KWIKPEN) 200 UNIT/ML KwikPen [id number] No 6 units with breakfast, 6 units with lunch, and 8 units with dinner  Patient taking differently: 10 units with breakfast, 10 units with lunch, and 14 units with dinner   Glendale Chard, MD Taking Active   magnesium oxide (MAG-OX) 400 MG tablet [id number] No Take 400 mg by mouth daily. [provider] Taking Active   metoprolol succinate (TOPROL-XL) 25 MG 24 hr tablet [id number] No Take 1 tablet (25 mg total) by mouth daily. Glendale Chard, MD Taking Active   Misc Natural Products [facility] ZINC/VIT C/IMMUNE MT) [id number] No Use as directed 1 capsule in the mouth or throat daily  at 12 noon. [provider] Taking Active   Multiple Vitamin (MULTIVITAMIN WITH  MINERALS) TABS tablet [id number] No Take 1 tablet by mouth daily. [provider] Taking Active Self  nitroGLYCERIN (NITROSTAT) 0.4 MG SL tablet [id number] No Place 1 tablet (0.4 mg total) under the tongue every 5 (five) minutes as needed for chest pain. Glendale Chard, MD Taking Active   NOVOFINE PEN NEEDLE 32G X 6 MM MISC [id number] No USE 5 TO 8 NEEDLES PER DAY AS DIRECTED PER INSULIN PROTOCOL Minette Brine, FNP Taking Active   oxyCODONE (OXY IR/ROXICODONE) 5 MG immediate release tablet [id number] No Take by mouth. [provider] Taking Active   pantoprazole (PROTONIX) 40 MG tablet [id number] No Take 1 tablet (40 mg total) by mouth 2 (two) times daily. Glendale Chard, MD Taking Active   Pitavastatin Calcium 4 MG TABS [id number] No Take 1 tablet (4 mg total) by mouth daily. Glendale Chard, MD Taking Active   torsemide (DEMADEX) 20 MG tablet [id number]  Take 1 tablet (20 mg total) by mouth daily. Glendale Chard, MD  Active   vitamin E 180 MG (400 UNITS) capsule [id number] No Take 400 Units by mouth daily. 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   Thyroid nodule 06/18/2020   Gallstones 06/18/2020   Sickle cell trait ([place]) 08/18/2018   Iron deficiency anemia 10/24/2016   Allergic rhinitis 06/07/2014   OSA (obstructive sleep apnea) 10/05/2013   Diabetes mellitus ([place]) 10/05/2013   Anemia 10/05/2013   Pulmonary hypertension ([place]) 10/04/2013   Acute right-sided CHF (congestive heart failure) ([place]) 10/04/2013    Immunization History  Administered Date(s) Administered   Influenza, High Dose Seasonal PF 01/27/2018   Influenza,inj,Quad PF,6+ Mos 10/06/2013, 11/06/2018, 04/12/2020   Moderna Sars-Covid-2 Vaccination 06/22/2019, 09/02/2019   Pneumococcal Conjugate-13 01/27/2018   Pneumococcal Polysaccharide-23 10/06/2013, 04/06/2019   Tdap 11/06/2018    Conditions to be  addressed/monitored:  Hyperlipidemia and Diabetes  Care Plan : [place]  Updates made by Mayford Knife, [place] since 07/03/2020 12:00 AM     Problem: HTN, HLD, DM II   Priority: High     Long-Range Goal: Disease Management   Recent Progress: On track  Priority: High  Note:   Current Barriers:  Does not adhere to prescribed medication regimen  Pharmacist Clinical Goal(s):  Patient will achieve adherence to monitoring guidelines and medication adherence to achieve therapeutic efficacy through collaboration with PharmD and provider.   Interventions: 1:1 collaboration with Glendale Chard, MD regarding development and update of comprehensive plan of care as evidenced by provider attestation and co-signature Inter-disciplinary care team collaboration (see longitudinal plan of care) Comprehensive medication review performed; medication list updated in electronic medical record  Pulmonary  Hypertension (BP goal <130/80) -Controlled -Current treatment: Metoprolol succinate 25 mg - take 1 tablet by mouth daily  Torsemide 20 mg tablet by mouth daily  -Current home readings: she is checking her BP at least once per day. Her pressure has been running 140-144/ 72-90, 148/94  -Current dietary habits: patient reports that her eating habits are not good. She has not had an appetite recently. She only had a cup of coffee this morning. If she is using salt it is the light salt.  -Current exercise habits: patient reports that she is exercising but not routinely -Denies hypotensive/hypertensive symptoms -Educated on Daily salt intake goal <  2300 mg; Exercise goal of 150 minutes per week; Importance of home blood pressure monitoring; -Counseled to monitor BP at home at least once per day, document, and provide log at future appointments -Counseled on diet and exercise extensively Recommended to continue current medication  Hyperlipidemia: (LDL goal < 70) -Uncontrolled -Current  treatment: Pitavastatin 4 mg tablet once daily  Aspirin 81 mg tablet daily  -Current dietary patterns:patient reports eating some fried and fatty foods, went to [employer] and had a burger, but did not eat the bread. She is eating more vegetables -Current exercise habits: she has not tried the silver sneakers program online -Current exercise habits: please see diabetes.  -Encouraged patient to eat more vegetables -Educated on Cholesterol goals;  Benefits of statin for ASCVD risk reduction; Importance of limiting foods high in cholesterol; Exercise goal of 150 minutes per week; -Recommended to continue current medication  Diabetes (A1c goal <7%) -Not ideally controlled -Current medications: Insulin Glargine 300 unit/ml - inject 58 units into the skin at bedtime  Insulin Lispro 200 unit/ml - 6 units with breakfast, 6 units with lunch and 8 units with dinner.  -Current home glucose readings- patient reports that she checked her BS 4 times this morning. She checked it with her freestyle monitor and her finger stick   CGM: patient reports they have been running too high or too low. The lowest she had was 62, her alarm woke her up, this occurred last week, the second time it was 55.  -Denies hypoglycemic/hyperglycemic symptoms -We discussed her CGM numbers in details and goals  -Current meal patterns: Patient reports that they have the frozen vegetables on the list.  -Current exercise: patient has not started exercising yet.  -Educated on A1c and blood sugar goals; Exercise goal of 150 minutes per week; Benefits of weight loss; Prevention and management of hypoglycemic episodes; Benefits of routine self-monitoring of blood sugar; -Counseled to check feet daily and get yearly eye exams -Recommended to continue current medication  Health Maintenance -Vaccine gaps:              COVID -19 Booster:  Will get booster at [facility] this weekend      -Will confirm patient received vaccine through  [facility] registry  -Patient is satisfied with current therapy and denies issues -Recommended to continue current medication  Patient Goals/Self-Care Activities Patient will:  - take medications as prescribed  Follow Up Plan: The patient has been provided with contact information for the care management team and has been advised to call with any health related questions or concerns.       Medication Assistance: None required.  Patient affirms current coverage meets needs.  Compliance/Adherence/Medication fill history: Care Gaps: -Patient missed thyroid ultrasound will speak with Tillie Rung to help patient with transportation services.  Star-Rating Drugs: Pitavastatin 4 mg tablet  Patient's preferred pharmacy is:  [facility] - [place], [place] - [address] [place] [address] Phone: [phone] Fax: [phone]  [facility] at [place], [address] [place] [address] [place] [address] Phone: [phone] Fax: [phone]  Uses pill box? Yes Pt endorses 95% compliance  We discussed: Benefits of medication synchronization, packaging and delivery as well as enhanced pharmacist oversight with [facility]. Patient decided to: Continue current medication management strategy  Care Plan and Follow Up Patient Decision:  Patient agrees to Care Plan and Follow-up.  Plan: The patient has been provided with contact information  for the care management team and has been advised to call with any health related questions or concerns.   Orlando Penner, PharmD Clinical Pharmacist [facility] [phone]

## 2020-06-27 DIAGNOSIS — N1831 Chronic kidney disease, stage 3a: Secondary | ICD-10-CM | POA: Diagnosis not present

## 2020-06-27 DIAGNOSIS — I5032 Chronic diastolic (congestive) heart failure: Secondary | ICD-10-CM | POA: Diagnosis not present

## 2020-06-27 DIAGNOSIS — Z794 Long term (current) use of insulin: Secondary | ICD-10-CM | POA: Diagnosis not present

## 2020-06-27 DIAGNOSIS — D649 Anemia, unspecified: Secondary | ICD-10-CM | POA: Diagnosis not present

## 2020-06-27 DIAGNOSIS — I10 Essential (primary) hypertension: Secondary | ICD-10-CM | POA: Diagnosis not present

## 2020-06-27 DIAGNOSIS — N250 Renal osteodystrophy: Secondary | ICD-10-CM | POA: Diagnosis not present

## 2020-06-27 DIAGNOSIS — E1122 Type 2 diabetes mellitus with diabetic chronic kidney disease: Secondary | ICD-10-CM | POA: Diagnosis not present

## 2020-07-03 NOTE — Patient Instructions (Signed)
Visit Information It was great speaking with you today!  Please let me know if you have any questions about our visit.   Goals Addressed             This Visit's Progress    Manage My Medicine       Timeframe:  Long-Range Goal Priority:  High Start Date:                             Expected End Date:                       Follow Up Date 10/02/2020   - call for medicine refill 2 or 3 days before it runs out - keep a list of all the medicines I take; vitamins and herbals too - learn to read medicine labels - use a pillbox to sort medicine    Why is this important?   These steps will help you keep on track with your medicines.            Patient Care Plan: CCM Pharmacy Care Plan     Problem Identified: HTN, HLD, DM II   Priority: High     Long-Range Goal: Disease Management   Recent Progress: On track  Priority: High  Note:   Current Barriers:  Does not adhere to prescribed medication regimen  Pharmacist Clinical Goal(s):  Patient will achieve adherence to monitoring guidelines and medication adherence to achieve therapeutic efficacy through collaboration with PharmD and provider.   Interventions: 1:1 collaboration with Glendale Chard, MD regarding development and update of comprehensive plan of care as evidenced by provider attestation and co-signature Inter-disciplinary care team collaboration (see longitudinal plan of care) Comprehensive medication review performed; medication list updated in electronic medical record  Pulmonary  Hypertension (BP goal <130/80) -Controlled -Current treatment: Metoprolol succinate 25 mg - take 1 tablet by mouth daily  Torsemide 20 mg tablet by mouth daily  -Current home readings: she is checking her BP at least once per day. Her pressure has been running 140-144/ 72-90, 148/94  -Current dietary habits: patient reports that her eating habits are not good. She has not had an appetite recently. She only had a cup of coffee this  morning. If she is using salt it is the light salt.  -Current exercise habits: patient reports that she is exercising but not routinely -Denies hypotensive/hypertensive symptoms -Educated on Daily salt intake goal < 2300 mg; Exercise goal of 150 minutes per week; Importance of home blood pressure monitoring; -Counseled to monitor BP at home at least once per day, document, and provide log at future appointments -Counseled on diet and exercise extensively Recommended to continue current medication  Hyperlipidemia: (LDL goal < 70) -Uncontrolled -Current treatment: Pitavastatin 4 mg tablet once daily  Aspirin 81 mg tablet daily  -Current dietary patterns:patient reports eating some fried and fatty foods, went to [employer] and had a burger, but did not eat the bread. She is eating more vegetables -Current exercise habits: she has not tried the silver sneakers program online -Current exercise habits: please see diabetes.  -Encouraged patient to eat more vegetables -Educated on Cholesterol goals;  Benefits of statin for ASCVD risk reduction; Importance of limiting foods high in cholesterol; Exercise goal of 150 minutes per week; -Recommended to continue current medication  Diabetes (A1c goal <7%) -Not ideally controlled -Current medications: Insulin Glargine 300 unit/ml - inject 58 units into the skin  at bedtime  Insulin Lispro 200 unit/ml - 6 units with breakfast, 6 units with lunch and 8 units with dinner.  -Current home glucose readings- patient reports that she checked her BS 4 times this morning. She checked it with her freestyle monitor and her finger stick   CGM: patient reports they have been running too high or too low. The lowest she had was 62, her alarm woke her up, this occurred last week, the second time it was 27.  -Denies hypoglycemic/hyperglycemic symptoms -We discussed her CGM numbers in details and goals  -Current meal patterns: Patient reports that they have the  frozen vegetables on the list.  -Current exercise: patient has not started exercising yet.  -Educated on A1c and blood sugar goals; Exercise goal of 150 minutes per week; Benefits of weight loss; Prevention and management of hypoglycemic episodes; Benefits of routine self-monitoring of blood sugar; -Counseled to check feet daily and get yearly eye exams -Recommended to continue current medication  Health Maintenance -Vaccine gaps:              COVID -19 Booster:  Will get booster at [facility] this weekend      -Will confirm patient received vaccine through [facility] registry  -Patient is satisfied with current therapy and denies issues -Recommended to continue current medication  Patient Goals/Self-Care Activities Patient will:  - take medications as prescribed  Follow Up Plan: The patient has been provided with contact information for the care management team and has been advised to call with any health related questions or concerns.        Patient agreed to services and verbal consent obtained.   The patient verbalized understanding of instructions, educational materials, and care plan provided today and agreed to receive a mailed copy of patient instructions, educational materials, and care plan.   Orlando Penner, PharmD Clinical Pharmacist [facility] [phone]

## 2020-07-05 ENCOUNTER — Telehealth: Payer: Self-pay

## 2020-07-05 ENCOUNTER — Other Ambulatory Visit: Payer: Self-pay

## 2020-07-05 MED ORDER — METOPROLOL SUCCINATE ER 25 MG PO TB24: 25.0000 mg | ORAL_TABLET | Freq: Every day | ORAL | 1 refills | Status: DC

## 2020-07-05 MED ORDER — ELIQUIS 5 MG PO TABS: 5.0000 mg | ORAL_TABLET | Freq: Two times a day (BID) | ORAL | 0 refills | Status: AC

## 2020-07-05 NOTE — Chronic Care Management (AMB) (Addendum)
Chronic Care Management Pharmacy Assistant   Name: Anna Clay  MRN: [id number] DOB: Apr 16, 1950   Reason for Encounter: Medication Review/ Medication coordination  Recent office visits:  None  Recent consult visits:  06-27-2020 Melburn Popper, MD (Nephrology)  Hospital visits:  None in previous 6 months  Medications: Outpatient Encounter Medications as of 07/05/2020  Medication Sig Note   Accu-Chek FastClix Lancets MISC USE AS DIRECTED TO CHECK BLOOD SUGARS 2 TIMES PER DAY DX:E11.65    Accu-Chek Softclix Lancets lancets Use to check blood sugars twice daily E11.69    acetaminophen (TYLENOL) 650 MG CR tablet Take 1,300 mg by mouth every 8 (eight) hours as needed for pain.    allopurinol (ZYLOPRIM) 100 MG tablet Take 1 tablet (100 mg total) by mouth daily.    aspirin EC 81 MG tablet Take 81 mg by mouth daily. (Patient not taking: Reported on 05/10/2020) 05/10/2020: Discontinued from the last hospital visit.    Blood Glucose Monitoring Suppl (ACCU-CHEK GUIDE) w/Device KIT USE AS DIRECTED TO CHECK BLOOD SUGARS 2 TIMES PER DAY DX:E11.65    calcium carbonate (TUMS - DOSED IN MG ELEMENTAL CALCIUM) 500 MG chewable tablet Chew 2 tablets by mouth daily. 2 per day    Cholecalciferol (VITAMIN D) 2000 UNITS CAPS Take 1 capsule by mouth daily.    colchicine 0.6 MG tablet Take 1 tablet (0.6 mg total) by mouth daily.    ELIQUIS 5 MG TABS tablet Take 1 tablet (5 mg total) by mouth 2 (two) times daily.    EPINEPHrine (EPIPEN 2-PAK) 0.3 mg/0.3 mL IJ SOAJ injection Inject 0.3 mLs (0.3 mg total) into the muscle as needed for anaphylaxis.    ferrous sulfate 325 (65 FE) MG EC tablet Take 325 mg by mouth daily.    fexofenadine (ALLEGRA) 180 MG tablet Take 180 mg by mouth daily.    folic acid (FOLVITE) 1 MG tablet TAKE ONE TABLET BY MOUTH DAILY    gabapentin (NEURONTIN) 300 MG capsule Take 1 capsule (300 mg total) by mouth 3 (three) times daily.    glucose blood (ACCU-CHEK GUIDE) test strip Use to  check blood sugars twice daily E11.69    insulin glargine, 1 Unit Dial, (TOUJEO SOLOSTAR) 300 UNIT/ML Solostar Pen Inject 58 Units into the skin at bedtime. (Patient taking differently: Inject 25 Units into the skin at bedtime.)    insulin lispro (HUMALOG KWIKPEN) 200 UNIT/ML KwikPen 6 units with breakfast, 6 units with lunch, and 8 units with dinner (Patient taking differently: 10 units with breakfast, 10 units with lunch, and 14 units with dinner)    magnesium oxide (MAG-OX) 400 MG tablet Take 400 mg by mouth daily.    metoprolol succinate (TOPROL-XL) 25 MG 24 hr tablet Take 1 tablet (25 mg total) by mouth daily.    Misc Natural Products (ELDERBERRY ZINC/VIT C/IMMUNE MT) Use as directed 1 capsule in the mouth or throat daily at 12 noon.    Multiple Vitamin (MULTIVITAMIN WITH MINERALS) TABS tablet Take 1 tablet by mouth daily.    nitroGLYCERIN (NITROSTAT) 0.4 MG SL tablet Place 1 tablet (0.4 mg total) under the tongue every 5 (five) minutes as needed for chest pain.    NOVOFINE PEN NEEDLE 32G X 6 MM MISC USE 5 TO 8 NEEDLES PER DAY AS DIRECTED PER INSULIN PROTOCOL    oxyCODONE (OXY IR/ROXICODONE) 5 MG immediate release tablet Take by mouth.    pantoprazole (PROTONIX) 40 MG tablet Take 1 tablet (40 mg total) by mouth 2 (  two) times daily.    Pitavastatin Calcium 4 MG TABS Take 1 tablet (4 mg total) by mouth daily.    torsemide (DEMADEX) 20 MG tablet Take 1 tablet (20 mg total) by mouth daily.    vitamin E 180 MG (400 UNITS) capsule Take 400 Units by mouth daily.    No facility-administered encounter medications on file as of 07/05/2020.   Reviewed chart for medication changes ahead of medication coordination call.  No medication changes indicated OR if recent visit, treatment plan here.  BP Readings from Last 3 Encounters:  06/06/20 (!) 150/90  04/12/20 138/72  01/30/20 (!) 165/100    Lab Results  Component Value Date   HGBA1C 7.0 01/31/2020     Patient obtains medications through  Adherence Packaging  30 Days   Last adherence delivery included:  Metoprolol 25 mg- 1 tablet daily (before breakfast) Torsemide 20 mg- 1 tablet daily (before breakfast) Pantoprazole 40 mg- 1 tablet twice daily( direction change)- (before breakfast, evening meal) Allopurinol 100 mg- 1 tablet daily (breakfast) Gabapentin 300 mg- 1 capsule three times daily (before breakfast, evening meal, bedtime) Eliquis 5 mg- 1 tablet twice daiy (breakfast and evening meal) Humalog Kwikpen- Inject 6 units with breakfast, 6 units with lunch, and 8 units with dinner  Patient declined the following medications last month: Colchicine 0.6 mg- 1 tablet daily prn (vials)- Due to PRN use. Freestyle Kit sensor- Use to check blood sugars three times daily Freestyle Reader- Use to check blood sugars three times daily Accu-check Guide Glucometer- Use to check blood sugars three times daily Accu-check Guide Lancets Device- Use to check blood sugars three times daily Accu-check Guide test strips- Use to check blood sugars three times daily              Due to not being covered by insurance- Patient receives [employer] from [employer]. Toujeo- Inject 58 units sq at bedtime due to having 1 full box with 3 pens on hand. Amlodipine 5 mg due to being discontinued Cyclobenzaprine 10 mg- due to no longer taking Nitroglycerin 0.4 mg due to prn use Livalo 4 mg due to being discontinued Epinephrine 0.3 mg/0.3 ml due to prn use Spironolactone 25 mg due to being discontinued.  Asa 81 mg- due to being discontinued.   Patient is due for next adherence delivery on: 07-16-2020  Called patient and reviewed medications and coordinated delivery.  This delivery to include: Metoprolol 25 mg- 1 tablet daily (before breakfast) Torsemide 20 mg- 1 tablet daily (before breakfast) Pantoprazole 40 mg- 1 tablet twice daily( direction change)- (before breakfast, evening meal) Allopurinol 100 mg- 1 tablet daily  (breakfast) Gabapentin 300 mg- 1 capsule three times daily (before breakfast, evening meal, bedtime) Eliquis 5 mg- 1 tablet twice daiy (breakfast and evening meal) Humalog Kwikpen- Inject 6 units with breakfast, 6 units with lunch, and 8 units with dinner Toujeo 300 unit- Inject 25 units at bedtime  No short or acute fill needed  Patient declined the following medications: Colchicine 0.6 mg- 1 tablet daily prn (vials)- Due to PRN use. Freestyle Kit sensor- Use to check blood sugars three times daily Freestyle Reader- Use to check blood sugars three times daily Accu-check Guide Glucometer- Use to check blood sugars three times daily Accu-check Guide Lancets Device- Use to check blood sugars three times daily Accu-check Guide test strips- Use to check blood sugars three times daily  Due to not being covered by insurance- Patient receives [employer] from [employer]. Toujeo- Inject 58 units sq at bedtime due to having 1 full box with 3 pens on hand. Amlodipine 5 mg due to being discontinued Cyclobenzaprine 10 mg- due to no longer taking Nitroglycerin 0.4 mg due to prn use Livalo 4 mg due to being discontinued Epinephrine 0.3 mg/0.3 ml due to prn use Spironolactone 25 mg due to being discontinued.  Asa 81 mg- due to being discontinued.   Patient needs refills for Metoprolol 25 mg, Eliquis 5 mg. Request sent to PCP.  Confirmed delivery date of 07-16-2020, advised patient that pharmacy will contact them the morning of delivery.  NOTES: Patient recently started Losartan which will need to be [facility]ced [facility] at next delivery. Informed patient to call Kristopher Oppenheim to cancel prescription. Patient reported recent blood pressure of 142/78 and blood sugar 108.Sent PCP refill request  Star Rating Drugs: Losartan 50 mg- Last filled 06-27-2020 30 [facility] Clinical Pharmacist Assistant [phone]

## 2020-07-06 ENCOUNTER — Other Ambulatory Visit: Payer: Self-pay

## 2020-07-06 MED ORDER — TOUJEO SOLOSTAR 300 UNIT/ML ~~LOC~~ SOPN: 25.0000 [IU] | PEN_INJECTOR | Freq: Every day | SUBCUTANEOUS | 3 refills | Status: DC

## 2020-07-11 ENCOUNTER — Telehealth: Payer: Self-pay | Admitting: Internal Medicine

## 2020-07-11 NOTE — Telephone Encounter (Signed)
Left message for patient to call back and schedule Medicare Annual Wellness Visit (AWV) either virtually or in office.   Last AWV 04/06/19  please schedule at anytime with [facility]    This should be a 45 minute visit.

## 2020-07-12 ENCOUNTER — Other Ambulatory Visit: Payer: Self-pay

## 2020-07-12 MED ORDER — TRESIBA FLEXTOUCH 200 UNIT/ML ~~LOC~~ SOPN: 25.0000 [IU] | PEN_INJECTOR | Freq: Every day | SUBCUTANEOUS | 1 refills | Status: DC

## 2020-07-18 DIAGNOSIS — I10 Essential (primary) hypertension: Secondary | ICD-10-CM | POA: Diagnosis not present

## 2020-07-18 DIAGNOSIS — I50811 Acute right heart failure: Secondary | ICD-10-CM | POA: Diagnosis not present

## 2020-07-18 DIAGNOSIS — I5032 Chronic diastolic (congestive) heart failure: Secondary | ICD-10-CM | POA: Diagnosis not present

## 2020-08-01 ENCOUNTER — Ambulatory Visit (INDEPENDENT_AMBULATORY_CARE_PROVIDER_SITE_OTHER): Payer: Medicare Other

## 2020-08-01 VITALS — Ht 63.0 in | Wt 251.0 lb

## 2020-08-01 DIAGNOSIS — Z0000 Encounter for general adult medical examination without abnormal findings: Secondary | ICD-10-CM | POA: Diagnosis not present

## 2020-08-01 NOTE — Progress Notes (Addendum)
I connected with  Josy L Bayles today via telehealth video enabled device and verified that I am speaking with the correct person using two identifiers.   Location: Patient: home Provider: work  Persons participating in virtual visit: patient, provider  I discussed the limitations, risks, security and privacy concerns of performing an evaluation and management service by video and the availability of in person appointments. The patient expressed understanding and agreed to proceed.   Some vital signs may be absent or patient reported.     Subjective:   Anna Clay is a 70 y.o. female who presents for Medicare Annual (Subsequent) preventive examination.  Review of Systems     Cardiac Risk Factors include: advanced age (>59mn, >>11women);diabetes mellitus;hypertension;obesity (BMI >30kg/m2)     Objective:    Today's Vitals   08/01/20 1004 08/01/20 1005  Weight: 251 lb (113.9 kg)   Height: 5' 3"  (1.6 m)   PainSc:  2    Body mass index is 44.46 kg/m.  Advanced Directives 08/01/2020 04/06/2019 05/25/2018 02/17/2018 12/05/2016 11/10/2016 10/24/2016  Does Patient Have a Medical Advance Directive? No No No No No No No  Would patient like information on creating a medical advance directive? - Yes (MAU/Ambulatory/Procedural Areas - Information given) No - Patient declined Yes (MAU/Ambulatory/Procedural Areas - Information given) No - Patient declined No - Patient declined Yes (MAU/Ambulatory/Procedural Areas - Information given)    Current Medications (verified) Outpatient Encounter Medications as of 08/01/2020  Medication Sig   Accu-Chek FastClix Lancets MISC USE AS DIRECTED TO CHECK BLOOD SUGARS 2 TIMES PER DAY DX:E11.65   Accu-Chek Softclix Lancets lancets Use to check blood sugars twice daily E11.69   acetaminophen (TYLENOL) 650 MG CR tablet Take 1,300 mg by mouth every 8 (eight) hours as needed for pain.   allopurinol (ZYLOPRIM) 100 MG tablet Take 1 tablet (100 mg total) by mouth  daily.   Blood Glucose Monitoring Suppl (ACCU-CHEK GUIDE) w/Device KIT USE AS DIRECTED TO CHECK BLOOD SUGARS 2 TIMES PER DAY DX:E11.65   calcium carbonate (TUMS - DOSED IN MG ELEMENTAL CALCIUM) 500 MG chewable tablet Chew 2 tablets by mouth daily. 2 per day   Cholecalciferol (VITAMIN D) 2000 UNITS CAPS Take 1 capsule by mouth daily.   colchicine 0.6 MG tablet Take 1 tablet (0.6 mg total) by mouth daily.   ELIQUIS 5 MG TABS tablet Take 1 tablet (5 mg total) by mouth 2 (two) times daily.   EPINEPHrine (EPIPEN 2-PAK) 0.3 mg/0.3 mL IJ SOAJ injection Inject 0.3 mLs (0.3 mg total) into the muscle as needed for anaphylaxis.   ferrous sulfate 325 (65 FE) MG EC tablet Take 325 mg by mouth daily.   fexofenadine (ALLEGRA) 180 MG tablet Take 180 mg by mouth daily.   folic acid (FOLVITE) 1 MG tablet TAKE ONE TABLET BY MOUTH DAILY   gabapentin (NEURONTIN) 300 MG capsule Take 1 capsule (300 mg total) by mouth 3 (three) times daily.   glucose blood (ACCU-CHEK GUIDE) test strip Use to check blood sugars twice daily E11.69   insulin degludec (TRESIBA FLEXTOUCH) 200 UNIT/ML FlexTouch Pen Inject 26 Units into the skin at bedtime.   insulin lispro (HUMALOG KWIKPEN) 200 UNIT/ML KwikPen 6 units with breakfast, 6 units with lunch, and 8 units with dinner (Patient taking differently: 10 units with breakfast, 10 units with lunch, and 14 units with dinner)   losartan (COZAAR) 50 MG tablet Take by mouth.   magnesium oxide (MAG-OX) 400 MG tablet Take 400 mg by mouth daily.  metoprolol succinate (TOPROL-XL) 25 MG 24 hr tablet Take 1 tablet (25 mg total) by mouth daily.   Multiple Vitamin (MULTIVITAMIN WITH MINERALS) TABS tablet Take 1 tablet by mouth daily.   nitroGLYCERIN (NITROSTAT) 0.4 MG SL tablet Place 1 tablet (0.4 mg total) under the tongue every 5 (five) minutes as needed for chest pain.   NOVOFINE PEN NEEDLE 32G X 6 MM MISC USE 5 TO 8 NEEDLES PER DAY AS DIRECTED PER INSULIN PROTOCOL   oxyCODONE (OXY IR/ROXICODONE) 5  MG immediate release tablet Take by mouth.   pantoprazole (PROTONIX) 40 MG tablet Take 1 tablet (40 mg total) by mouth 2 (two) times daily.   Pitavastatin Calcium 4 MG TABS Take 1 tablet (4 mg total) by mouth daily.   torsemide (DEMADEX) 20 MG tablet Take 1 tablet (20 mg total) by mouth daily.   vitamin E 180 MG (400 UNITS) capsule Take 400 Units by mouth daily.   aspirin EC 81 MG tablet Take 81 mg by mouth daily. (Patient not taking: No sig reported)   Misc Natural Products (ELDERBERRY ZINC/VIT C/IMMUNE MT) Use as directed 1 capsule in the mouth or throat daily at 12 noon.   No facility-administered encounter medications on file as of 08/01/2020.    Allergies (verified) Allspice [pimenta], Black cohosh, Peach [prunus persica], Heparin, Ivp dye [iodinated diagnostic agents], and Tetracyclines & related   History: Past Medical History:  Diagnosis Date   Chest pain    Diabetes mellitus with kidney disease ([place])    Diastolic heart failure ([place])    Gout    Hypercholesteremia    Hypertension    Obesity    OSA (obstructive sleep apnea)    Oxygen deficiency    Past Surgical History:  Procedure Laterality Date   ABDOMINAL HYSTERECTOMY     UVULOPALATOPHARYNGOPLASTY (UPPP)/TONSILLECTOMY/SEPTOPLASTY  01/25/2008   By Minna Merritts   Family History  Problem Relation Age of Onset   Heart disease Father    Heart attack Father    Stroke Father    Heart disease Sister    Cancer - Lung Sister    Sickle cell trait Other        all family members   Cancer Brother        bone   Diabetes Brother    Diabetes Sister    Thyroid disease Mother    Goiter Mother    Social History   Socioeconomic History   Marital status: Widowed    Spouse name: Not on file   Number of children: 1   Years of education: Not on file   Highest education level: Not on file  Occupational History   Occupation: retired  Tobacco Use   Smoking status: Former    Packs/day: 1.00    Years: 43.00    Pack years:  43.00    Types: Cigarettes    Quit date: 09/20/2013    Years since quitting: 6.8   Smokeless tobacco: Never   Tobacco comments:    she has since quit  Vaping Use   Vaping Use: Never used  Substance and Sexual Activity   Alcohol use: Not Currently    Alcohol/week: 0.0 standard drinks   Drug use: No   Sexual activity: Not Currently  Other Topics Concern   Not on file  Social History Narrative   Not on file   Social Determinants of Health   Financial Resource Strain: Low Risk    Difficulty of Paying Living Expenses: Not hard at all  Food  Insecurity: No Food Insecurity   Worried About Charity fundraiser in the Last Year: Never true   Ran Out of Food in the Last Year: Never true  Transportation Needs: No Transportation Needs   Lack of Transportation (Medical): No   Lack of Transportation (Non-Medical): No  Physical Activity: Insufficiently Active   Days of Exercise per Week: 5 days   Minutes of Exercise per Session: 20 min  Stress: No Stress Concern Present   Feeling of Stress : Not at all  Social Connections: Not on file    Tobacco Counseling Counseling given: Not Answered Tobacco comments: she has since quit   Clinical Intake:  Pre-visit preparation completed: Yes  Pain : 0-10 Pain Score: 2  Pain Type: Chronic pain Pain Location: Back Pain Orientation: Lower Pain Radiating Towards: down right hip Pain Descriptors / Indicators: Shooting, Aching Pain Onset: More than a month ago Pain Frequency: Constant     Nutritional Status: BMI > 30  Obese Nutritional Risks: None Diabetes: Yes  How often do you need to have someone help you when you read instructions, pamphlets, or other written materials from your doctor or pharmacy?: 1 - Never What is the last grade level you completed in school?: 3rd year college  Diabetic? Yes Nutrition Risk Assessment:  Has the patient had any N/V/D within the last 2 months?  No  Does the patient have any non-healing wounds?  No   Has the patient had any unintentional weight loss or weight gain?  No   Diabetes:  Is the patient diabetic?  Yes  If diabetic, was a CBG obtained today?  No  Did the patient bring in their glucometer from home?  No  How often do you monitor your CBG's? 4-6 times daily.   Financial Strains and Diabetes Management:  Are you having any financial strains with the device, your supplies or your medication? No .  Does the patient want to be seen by Chronic Care Management for management of their diabetes?  No  Would the patient like to be referred to a Nutritionist or for Diabetic Management?  No   Diabetic Exams:  Diabetic Eye Exam: Overdue for diabetic eye exam. Pt has been advised about the importance in completing this exam. Patient advised to call and schedule an eye exam. Diabetic Foot Exam: Overdue, Pt has been advised about the importance in completing this exam. Pt is scheduled for diabetic foot exam on next appointment.   Interpreter Needed?: No  Information entered by :: NAllen LPN   Activities of Daily Living In your present state of health, do you have any difficulty performing the following activities: 08/01/2020 04/12/2020  Hearing? N N  Vision? N N  Difficulty concentrating or making decisions? N N  Walking or climbing stairs? N Y  Dressing or bathing? N Y  Doing errands, shopping? N Y  Conservation officer, nature and eating ? N -  Using the Toilet? N -  In the past six months, have you accidently leaked urine? N -  Do you have problems with loss of bowel control? N -  Managing your Medications? N -  Managing your Finances? N -  Housekeeping or managing your Housekeeping? N -  Some recent data might be hidden    Patient Care Team: Gl[place]ale Chard, MD as PCP - General (Internal Medicine) Rex Kras, Claudette Stapler, RN as Case Manager [place], Kennieth Francois, [facility] (Inactive) (Pharmacist)  Indicate any recent Medical Services you may have received from other than [facility] providers  in the past  year (date may be approximate).     Assessment:   This is a routine wellness examination for [place].  Hearing/Vision screen Vision Screening - Comments:: No regular eye exams,  Dietary issues and exercise activities discussed: Current Exercise Habits: Home exercise routine, Type of exercise: Other - see comments (chair exercises), Time (Minutes): 20, Frequency (Times/Week): 5, Weekly Exercise (Minutes/Week): 100   Goals Addressed             This Visit's Progress    Patient Stated       08/01/2020, wants to lose 5 pounds        Depression Screen PHQ 2/9 Scores 08/01/2020 04/12/2020 04/06/2019 05/25/2018 05/19/2018 02/17/2018  PHQ - 2 Score 0 0 0 0 0 0  PHQ- 9 Score - - 0 - - 5  Exception Documentation - Other- indicate reason in comment box - - - -    Fall Risk Fall Risk  08/01/2020 04/12/2020 04/12/2020 04/06/2019 08/18/2018  Falls in the past year? 0 1 (No Data) 0 0  Comment - 4 4 - -  Number falls in past yr: - 1 - - -  Injury with Fall? - 1 1 - -  Risk for fall due to : Impaired balance/gait;Impaired mobility;Medication side effect History of fall(s);Impaired mobility History of fall(s);Impaired mobility Impaired balance/gait;Medication side effect -  Follow up Falls evaluation completed;Education provided;Falls prevention discussed Falls evaluation completed;Education provided Education provided;Falls evaluation completed Falls evaluation completed;Education provided;Falls prevention discussed -    FALL RISK PREVENTION PERTAINING TO THE HOME:  Any stairs in or around the home? Yes  If so, are there any without handrails? No  Home free of loose throw rugs in walkways, pet beds, electrical cords, etc? Yes  Adequate lighting in your home to reduce risk of falls? Yes   ASSISTIVE DEVICES UTILIZED TO PREVENT FALLS:  Life alert? No  Use of a cane, walker or w/c? Yes  Grab bars in the bathroom? Yes  Shower chair or bench in shower? Yes  Elevated toilet seat or a handicapped  toilet? Yes   TIMED UP AND GO:  Was the test performed? No .      Cognitive Function:     6CIT Screen 08/01/2020 04/06/2019 02/17/2018  What Year? 0 points 0 points 0 points  What month? 0 points 0 points 0 points  What time? 0 points 0 points 0 points  Count back from 20 0 points 0 points 0 points  Months in reverse 0 points 0 points 0 points  Repeat phrase 2 points 0 points 0 points  Total Score 2 0 0    Immunizations Immunization History  Administered Date(s) Administered   Influenza, High Dose Seasonal PF 01/27/2018   Influenza,inj,Quad PF,6+ Mos 10/06/2013, 11/06/2018, 04/12/2020   Moderna Sars-Covid-2 Vaccination 06/22/2019, 09/02/2019   Pneumococcal Conjugate-13 01/27/2018   Pneumococcal Polysaccharide-23 10/06/2013, 04/06/2019   Tdap 11/06/2018    TDAP status: Up to date  Flu Vaccine status: Up to date  Pneumococcal vaccine status: Up to date  Covid-19 vaccine status: Completed vaccines  Qualifies for Shingles Vaccine? Yes   Zostavax completed No   Shingrix Completed?: No.    Education has been provided regarding the importance of this vaccine. Patient has been advised to call insurance company to determine out of pocket expense if they have not yet received this vaccine. Advised may also receive vaccine at local pharmacy or Health Dept. Verbalized acceptance and understanding.  Screening Tests Health Maintenance  Topic Date  Due   OPHTHALMOLOGY EXAM  Never done   Zoster Vaccines- Shingrix (1 of 2) Never done   FOOT EXAM  05/19/2019   COVID-19 Vaccine (4 - Booster for Moderna series) 12/03/2019   HEMOGLOBIN A1C  07/30/2020   INFLUENZA VACCINE  08/20/2020   MAMMOGRAM  10/11/2020   URINE MICROALBUMIN  04/12/2021   COLONOSCOPY (Pts 45-2yr Insurance coverage will need to be confirmed)  08/29/2025   TETANUS/TDAP  11/05/2028   DEXA SCAN  Completed   Hepatitis C Screening  Completed   PNA vac Low Risk Adult  Completed   HPV VACCINES  Aged Out    Health  Maintenance  Health Maintenance Due  Topic Date Due   OPHTHALMOLOGY EXAM  Never done   Zoster Vaccines- Shingrix (1 of 2) Never done   FOOT EXAM  05/19/2019   COVID-19 Vaccine (4 - Booster for Moderna series) 12/03/2019   HEMOGLOBIN A1C  07/30/2020    Colorectal cancer screening: Type of screening: Colonoscopy. Completed 06/2020. Repeat every 5 years  Mammogram status: Completed 10/12/2018. Repeat every year  Bone Density status: Completed 04/21/2019.  Lung Cancer Screening: (Low Dose CT Chest recommended if Age 69-80years, 30 pack-year currently smoking OR have quit w/in 15years.) does qualify.   Lung Cancer Screening Referral: CT scan 08/28/2020  Additional Screening:  Hepatitis C Screening: does qualify; Completed 03/28/2015  Vision Screening: Recommended annual ophthalmology exams for early detection of glaucoma and other disorders of the eye. Is the patient up to date with their annual eye exam?  No  Who is the provider or what is the name of the office in which the patient attends annual eye exams? Can't remember name If pt is not established with a provider, would they like to be referred to a provider to establish care? No .   Dental Screening: Recommended annual dental exams for proper oral hygiene  Community Resource Referral / Chronic Care Management: CRR required this visit?  No   CCM required this visit?  No      Plan:     I have personally reviewed and noted the following in the patient's chart:   Medical and social history Use of alcohol, tobacco or illicit drugs  Current medications and supplements including opioid prescriptions.  Functional ability and status Nutritional status Physical activity Advanced directives List of other physicians Hospitalizations, surgeries, and ER visits in previous 12 months Vitals Screenings to include cognitive, depression, and falls Referrals and appointments  In addition, I have reviewed and discussed with patient  certain preventive protocols, quality metrics, and best practice recommendations. A written personalized care plan for preventive services as well as general preventive health recommendations were provided to patient.     NKellie Simmering LPN   [id number]  Nurse Notes:

## 2020-08-01 NOTE — Patient Instructions (Signed)
[name] , Thank you for taking time to come for your Medicare Wellness Visit. I appreciate your ongoing commitment to your health goals. Please review the following plan we discussed and let me know if I can assist you in the future.   Screening recommendations/referrals: Colonoscopy: completed 06/2020 Mammogram: completed 10/12/2018 Bone Density: completed 04/21/2019 Recommended yearly ophthalmology/optometry visit for glaucoma screening and checkup Recommended yearly dental visit for hygiene and checkup  Vaccinations: Influenza vaccine: completed 04/12/2020, due 08/20/2020 Pneumococcal vaccine: completed 04/06/2019 Tdap vaccine: completed 11/06/2018 Shingles vaccine: discussed Covid-19: 09/02/2019, 06/22/2019, 05/10/2019  Advanced directives: Advance directive discussed with you today. .  Conditions/risks identified: none  Next appointment: Follow up in one year for your annual wellness visit    Preventive Care 65 Years and Older, Female Preventive care refers to lifestyle choices and visits with your health care provider that can promote health and wellness. What does preventive care include? A yearly physical exam. This is also called an annual well check. Dental exams once or twice a year. Routine eye exams. Ask your health care provider how often you should have your eyes checked. Personal lifestyle choices, including: Daily care of your teeth and gums. Regular physical activity. Eating a healthy diet. Avoiding tobacco and drug use. Limiting alcohol use. Practicing safe sex. Taking low-dose aspirin every day. Taking vitamin and mineral supplements as recommended by your health care provider. What happens during an annual well check? The services and screenings done by your health care provider during your annual well check will depend on your age, overall health, lifestyle risk factors, and family history of disease. Counseling  Your health care provider may ask you questions  about your: Alcohol use. Tobacco use. Drug use. Emotional well-being. Home and relationship well-being. Sexual activity. Eating habits. History of falls. Memory and ability to understand (cognition). Work and work Statistician. Reproductive health. Screening  You may have the following tests or measurements: Height, weight, and BMI. Blood pressure. Lipid and cholesterol levels. These may be checked every 5 years, or more frequently if you are over 99 years old. Skin check. Lung cancer screening. You may have this screening every year starting at age 83 if you have a 30-pack-year history of smoking and currently smoke or have quit within the past 15 years. Fecal occult blood test (FOBT) of the stool. You may have this test every year starting at age 32. Flexible sigmoidoscopy or colonoscopy. You may have a sigmoidoscopy every 5 years or a colonoscopy every 10 years starting at age 33. Hepatitis C blood test. Hepatitis B blood test. Sexually transmitted disease (STD) testing. Diabetes screening. This is done by checking your blood sugar (glucose) after you have not eaten for a while (fasting). You may have this done every 1-3 years. Bone density scan. This is done to screen for osteoporosis. You may have this done starting at age 91. Mammogram. This may be done every 1-2 years. Talk to your health care provider about how often you should have regular mammograms. Talk with your health care provider about your test results, treatment options, and if necessary, the need for more tests. Vaccines  Your health care provider may recommend certain vaccines, such as: Influenza vaccine. This is recommended every year. Tetanus, diphtheria, and acellular pertussis (Tdap, Td) vaccine. You may need a Td booster every 10 years. Zoster vaccine. You may need this after age 79. Pneumococcal 13-valent conjugate (PCV13) vaccine. One dose is recommended after age 69. Pneumococcal polysaccharide (PPSV23)  vaccine. One dose is  recommended after age 108. Talk to your health care provider about which screenings and vaccines you need and how often you need them. This information is not intended to replace advice given to you by your health care provider. Make sure you discuss any questions you have with your health care provider. Document Released: 02/02/2015 Document Revised: 09/26/2015 Document Reviewed: 11/07/2014 Elsevier Interactive Patient Education  2017 [place] Prevention in the Home Falls can cause injuries. They can happen to people of all ages. There are many things you can do to make your home safe and to help prevent falls. What can I do on the outside of my home? Regularly fix the edges of walkways and driveways and fix any cracks. Remove anything that might make you trip as you walk through a door, such as a raised step or threshold. Trim any bushes or trees on the path to your home. Use bright outdoor lighting. Clear any walking paths of anything that might make someone trip, such as rocks or tools. Regularly check to see if handrails are loose or broken. Make sure that both sides of any steps have handrails. Any raised decks and porches should have guardrails on the edges. Have any leaves, snow, or ice cleared regularly. Use sand or salt on walking paths during winter. Clean up any spills in your garage right away. This includes oil or grease spills. What can I do in the bathroom? Use night lights. Install grab bars by the toilet and in the tub and shower. Do not use towel bars as grab bars. Use non-skid mats or decals in the tub or shower. If you need to sit down in the shower, use a plastic, non-slip stool. Keep the floor dry. Clean up any water that spills on the floor as soon as it happens. Remove soap buildup in the tub or shower regularly. Attach bath mats securely with double-sided non-slip rug tape. Do not have throw rugs and other things on the floor that  can make you trip. What can I do in the bedroom? Use night lights. Make sure that you have a light by your bed that is easy to reach. Do not use any sheets or blankets that are too big for your bed. They should not hang down onto the floor. Have a firm chair that has side arms. You can use this for support while you get dressed. Do not have throw rugs and other things on the floor that can make you trip. What can I do in the kitchen? Clean up any spills right away. Avoid walking on wet floors. Keep items that you use a lot in easy-to-reach places. If you need to reach something above you, use a strong step stool that has a grab bar. Keep electrical cords out of the way. Do not use floor polish or wax that makes floors slippery. If you must use wax, use non-skid floor wax. Do not have throw rugs and other things on the floor that can make you trip. What can I do with my stairs? Do not leave any items on the stairs. Make sure that there are handrails on both sides of the stairs and use them. Fix handrails that are broken or loose. Make sure that handrails are as long as the stairways. Check any carpeting to make sure that it is firmly attached to the stairs. Fix any carpet that is loose or worn. Avoid having throw rugs at the top or bottom of the stairs. If you  do have throw rugs, attach them to the floor with carpet tape. Make sure that you have a light switch at the top of the stairs and the bottom of the stairs. If you do not have them, ask someone to add them for you. What else can I do to help prevent falls? Wear shoes that: Do not have high heels. Have rubber bottoms. Are comfortable and fit you well. Are closed at the toe. Do not wear sandals. If you use a stepladder: Make sure that it is fully opened. Do not climb a closed stepladder. Make sure that both sides of the stepladder are locked into place. Ask someone to hold it for you, if possible. Clearly mark and make sure that you  can see: Any grab bars or handrails. First and last steps. Where the edge of each step is. Use tools that help you move around (mobility aids) if they are needed. These include: Canes. Walkers. Scooters. Crutches. Turn on the lights when you go into a dark area. Replace any light bulbs as soon as they burn out. Set up your furniture so you have a clear path. Avoid moving your furniture around. If any of your floors are uneven, fix them. If there are any pets around you, be aware of where they are. Review your medicines with your doctor. Some medicines can make you feel dizzy. This can increase your chance of falling. Ask your doctor what other things that you can do to help prevent falls. This information is not intended to replace advice given to you by your health care provider. Make sure you discuss any questions you have with your health care provider. Document Released: 11/02/2008 Document Revised: 06/14/2015 Document Reviewed: 02/10/2014 Elsevier Interactive Patient Education  2017 [employer].

## 2020-08-03 ENCOUNTER — Telehealth: Payer: Self-pay

## 2020-08-03 DIAGNOSIS — I50811 Acute right heart failure: Secondary | ICD-10-CM | POA: Diagnosis not present

## 2020-08-03 NOTE — Chronic Care Management (AMB) (Signed)
Chronic Care Management Pharmacy Assistant   Name: Anna Clay  MRN: [id number] DOB: 1950-05-06   Reason for Encounter: Medication Review/ Medication Coordination    Recent office visits:  08-01-2020 Anna Merino, LPN. Medicare wellness  Recent consult visits:  07-18-2020 Anna Savin, NP (Cardiology)  Hospital visits:  None in previous 6 months  Medications: Outpatient Encounter Medications as of 08/03/2020  Medication Sig Note   Accu-Chek FastClix Lancets MISC USE AS DIRECTED TO CHECK BLOOD SUGARS 2 TIMES PER DAY DX:E11.65    Accu-Chek Softclix Lancets lancets Use to check blood sugars twice daily E11.69    acetaminophen (TYLENOL) 650 MG CR tablet Take 1,300 mg by mouth every 8 (eight) hours as needed for pain.    allopurinol (ZYLOPRIM) 100 MG tablet Take 1 tablet (100 mg total) by mouth daily.    aspirin EC 81 MG tablet Take 81 mg by mouth daily. (Patient not taking: No sig reported) 05/10/2020: Discontinued from the last hospital visit.    Blood Glucose Monitoring Suppl (ACCU-CHEK GUIDE) w/Device KIT USE AS DIRECTED TO CHECK BLOOD SUGARS 2 TIMES PER DAY DX:E11.65    calcium carbonate (TUMS - DOSED IN MG ELEMENTAL CALCIUM) 500 MG chewable tablet Chew 2 tablets by mouth daily. 2 per day    Cholecalciferol (VITAMIN D) 2000 UNITS CAPS Take 1 capsule by mouth daily.    colchicine 0.6 MG tablet Take 1 tablet (0.6 mg total) by mouth daily.    ELIQUIS 5 MG TABS tablet Take 1 tablet (5 mg total) by mouth 2 (two) times daily.    EPINEPHrine (EPIPEN 2-PAK) 0.3 mg/0.3 mL IJ SOAJ injection Inject 0.3 mLs (0.3 mg total) into the muscle as needed for anaphylaxis.    ferrous sulfate 325 (65 FE) MG EC tablet Take 325 mg by mouth daily.    fexofenadine (ALLEGRA) 180 MG tablet Take 180 mg by mouth daily.    folic acid (FOLVITE) 1 MG tablet TAKE ONE TABLET BY MOUTH DAILY    gabapentin (NEURONTIN) 300 MG capsule Take 1 capsule (300 mg total) by mouth 3 (three) times daily.    glucose  blood (ACCU-CHEK GUIDE) test strip Use to check blood sugars twice daily E11.69    insulin degludec (TRESIBA FLEXTOUCH) 200 UNIT/ML FlexTouch Pen Inject 26 Units into the skin at bedtime.    insulin lispro (HUMALOG KWIKPEN) 200 UNIT/ML KwikPen 6 units with breakfast, 6 units with lunch, and 8 units with dinner (Patient taking differently: 10 units with breakfast, 10 units with lunch, and 14 units with dinner)    losartan (COZAAR) 50 MG tablet Take by mouth.    magnesium oxide (MAG-OX) 400 MG tablet Take 400 mg by mouth daily.    metoprolol succinate (TOPROL-XL) 25 MG 24 hr tablet Take 1 tablet (25 mg total) by mouth daily.    Misc Natural Products (ELDERBERRY ZINC/VIT C/IMMUNE MT) Use as directed 1 capsule in the mouth or throat daily at 12 noon.    Multiple Vitamin (MULTIVITAMIN WITH MINERALS) TABS tablet Take 1 tablet by mouth daily.    nitroGLYCERIN (NITROSTAT) 0.4 MG SL tablet Place 1 tablet (0.4 mg total) under the tongue every 5 (five) minutes as needed for chest pain.    NOVOFINE PEN NEEDLE 32G X 6 MM MISC USE 5 TO 8 NEEDLES PER DAY AS DIRECTED PER INSULIN PROTOCOL    oxyCODONE (OXY IR/ROXICODONE) 5 MG immediate release tablet Take by mouth.    pantoprazole (PROTONIX) 40 MG tablet Take 1 tablet (40  mg total) by mouth 2 (two) times daily.    Pitavastatin Calcium 4 MG TABS Take 1 tablet (4 mg total) by mouth daily.    torsemide (DEMADEX) 20 MG tablet Take 1 tablet (20 mg total) by mouth daily.    vitamin E 180 MG (400 UNITS) capsule Take 400 Units by mouth daily.    No facility-administered encounter medications on file as of 08/03/2020.   Reviewed chart for medication changes ahead of medication coordination call.  No OVs, Consults, or hospital visits since last care coordination call/Pharmacist visit. (If appropriate, list visit date, provider name)  No medication changes indicated OR if recent visit, treatment plan here.  BP Readings from Last 3 Encounters:  06/06/20 (!) 150/90   04/12/20 138/72  01/30/20 (!) 165/100    Lab Results  Component Value Date   HGBA1C 7.0 01/31/2020     Patient obtains medications through Adherence Packaging  30 Days   Last adherence delivery included:  Metoprolol 25 mg- 1 tablet daily (before breakfast) Torsemide 20 mg- 1 tablet daily (before breakfast) Pantoprazole 40 mg- 1 tablet twice daily( direction change)- (before breakfast, evening meal) Allopurinol 100 mg- 1 tablet daily (breakfast) Gabapentin 300 mg- 1 capsule three times daily (before breakfast, evening meal, bedtime) Eliquis 5 mg- 1 tablet twice daiy (breakfast and evening meal) Humalog Kwikpen- Inject 6 units with breakfast, 6 units with lunch, and 8 units with dinner Toujeo 300 unit- Inject 25 units at bedtime    Patient declined (meds) last month due to: Colchicine 0.6 mg- 1 tablet daily prn (vials)- Due to PRN use. Freestyle Kit sensor- Use to check blood sugars three times daily Freestyle Reader- Use to check blood sugars three times daily Accu-check Guide Glucometer- Use to check blood sugars three times daily Accu-check Guide Lancets Device- Use to check blood sugars three times daily Accu-check Guide test strips- Use to check blood sugars three times daily              Due to not being covered by insurance- Patient receives [employer] from [employer]. Toujeo- Inject 58 units sq at bedtime due to having 1 full box with 3 pens on hand. Amlodipine 5 mg due to being discontinued Cyclobenzaprine 10 mg- due to no longer taking Nitroglycerin 0.4 mg due to prn use Livalo 4 mg due to being discontinued Epinephrine 0.3 mg/0.3 ml due to prn use Spironolactone 25 mg due to being discontinued.  Asa 81 mg- due to being discontinued.   Patient is due for next adherence delivery on: 08-14-2020  Called patient and reviewed medications and coordinated delivery.  This delivery to include: Gabapentin 300 mg- 1 capsule three times daily (before  breakfast, evening meal, bedtime) Pantoprazole 40 mg- 1 tablet twice daily( direction change)- (before breakfast, evening meal) Metoprolol 25 mg- 1 tablet daily (before breakfast) Torsemide 20 mg- 1 tablet daily (before breakfast) Allopurinol 100 mg- 1 tablet daily (breakfast) Eliquis 5 mg- 1 tablet twice daiy (breakfast and evening meal) Losartan 50 mg- 1 tablet daily (breakfast)  No acute or short fill needed  Patient declined the following medications: None  Patient needs refills for: Losartan 50 mg daily  Confirmed delivery date of 08-14-2020 advised patient that pharmacy will contact them the morning of delivery.  Care Gaps: Ophthalmology exam overdue Shingrix overdue Foot exam overdue Covid booster overdue Medicare wellness 08-22-2021 RAF= 5.913%  Star Rating Drugs: Losartan 50 mg- Last filled 06-27-2020 30 DS Kristopher Oppenheim  [facility]  [place] Clinical Pharmacist Assistant [phone]

## 2020-08-07 ENCOUNTER — Telehealth: Payer: Medicare Other

## 2020-08-08 ENCOUNTER — Encounter: Payer: Self-pay | Admitting: Internal Medicine

## 2020-08-14 ENCOUNTER — Encounter: Payer: Self-pay | Admitting: Internal Medicine

## 2020-08-28 DIAGNOSIS — E041 Nontoxic single thyroid nodule: Secondary | ICD-10-CM | POA: Diagnosis not present

## 2020-08-28 DIAGNOSIS — Z87891 Personal history of nicotine dependence: Secondary | ICD-10-CM | POA: Diagnosis not present

## 2020-09-05 ENCOUNTER — Telehealth: Payer: Self-pay

## 2020-09-05 NOTE — Chronic Care Management (AMB) (Signed)
Chronic Care Management Pharmacy Assistant   Name: CARESSE SEDIVY  MRN: [id number] DOB: 1950-02-15   Reason for Encounter: Medication Review/ Medication Coordination  Recent office visits:  None  Recent consult visits:  None  Hospital visits:  None in previous 6 months  Medications: Outpatient Encounter Medications as of 09/05/2020  Medication Sig Note   Accu-Chek FastClix Lancets MISC USE AS DIRECTED TO CHECK BLOOD SUGARS 2 TIMES PER DAY DX:E11.65    Accu-Chek Softclix Lancets lancets Use to check blood sugars twice daily E11.69    acetaminophen (TYLENOL) 650 MG CR tablet Take 1,300 mg by mouth every 8 (eight) hours as needed for pain.    allopurinol (ZYLOPRIM) 100 MG tablet Take 1 tablet (100 mg total) by mouth daily.    aspirin EC 81 MG tablet Take 81 mg by mouth daily. (Patient not taking: No sig reported) 05/10/2020: Discontinued from the last hospital visit.    Blood Glucose Monitoring Suppl (ACCU-CHEK GUIDE) w/Device KIT USE AS DIRECTED TO CHECK BLOOD SUGARS 2 TIMES PER DAY DX:E11.65    calcium carbonate (TUMS - DOSED IN MG ELEMENTAL CALCIUM) 500 MG chewable tablet Chew 2 tablets by mouth daily. 2 per day    Cholecalciferol (VITAMIN D) 2000 UNITS CAPS Take 1 capsule by mouth daily.    colchicine 0.6 MG tablet Take 1 tablet (0.6 mg total) by mouth daily.    ELIQUIS 5 MG TABS tablet Take 1 tablet (5 mg total) by mouth 2 (two) times daily.    EPINEPHrine (EPIPEN 2-PAK) 0.3 mg/0.3 mL IJ SOAJ injection Inject 0.3 mLs (0.3 mg total) into the muscle as needed for anaphylaxis.    ferrous sulfate 325 (65 FE) MG EC tablet Take 325 mg by mouth daily.    fexofenadine (ALLEGRA) 180 MG tablet Take 180 mg by mouth daily.    folic acid (FOLVITE) 1 MG tablet TAKE ONE TABLET BY MOUTH DAILY    gabapentin (NEURONTIN) 300 MG capsule Take 1 capsule (300 mg total) by mouth 3 (three) times daily.    glucose blood (ACCU-CHEK GUIDE) test strip Use to check blood sugars twice daily E11.69    insulin  degludec (TRESIBA FLEXTOUCH) 200 UNIT/ML FlexTouch Pen Inject 26 Units into the skin at bedtime.    insulin lispro (HUMALOG KWIKPEN) 200 UNIT/ML KwikPen 6 units with breakfast, 6 units with lunch, and 8 units with dinner (Patient taking differently: 10 units with breakfast, 10 units with lunch, and 14 units with dinner)    losartan (COZAAR) 50 MG tablet Take by mouth.    magnesium oxide (MAG-OX) 400 MG tablet Take 400 mg by mouth daily.    metoprolol succinate (TOPROL-XL) 25 MG 24 hr tablet Take 1 tablet (25 mg total) by mouth daily.    Misc Natural Products (ELDERBERRY ZINC/VIT C/IMMUNE MT) Use as directed 1 capsule in the mouth or throat daily at 12 noon.    Multiple Vitamin (MULTIVITAMIN WITH MINERALS) TABS tablet Take 1 tablet by mouth daily.    nitroGLYCERIN (NITROSTAT) 0.4 MG SL tablet Place 1 tablet (0.4 mg total) under the tongue every 5 (five) minutes as needed for chest pain.    NOVOFINE PEN NEEDLE 32G X 6 MM MISC USE 5 TO 8 NEEDLES PER DAY AS DIRECTED PER INSULIN PROTOCOL    oxyCODONE (OXY IR/ROXICODONE) 5 MG immediate release tablet Take by mouth.    pantoprazole (PROTONIX) 40 MG tablet Take 1 tablet (40 mg total) by mouth 2 (two) times daily.    Pitavastatin Calcium  4 MG TABS Take 1 tablet (4 mg total) by mouth daily.    torsemide (DEMADEX) 20 MG tablet Take 1 tablet (20 mg total) by mouth daily.    vitamin E 180 MG (400 UNITS) capsule Take 400 Units by mouth daily.    No facility-administered encounter medications on file as of 09/05/2020.  Reviewed chart for medication changes ahead of medication coordination call.  No OVs, Consults, or hospital visits since last care coordination call/Pharmacist visit. (If appropriate, list visit date, provider name)  No medication changes indicated OR if recent visit, treatment plan here.  BP Readings from Last 3 Encounters:  06/06/20 (!) 150/90  04/12/20 138/72  01/30/20 (!) 165/100    Lab Results  Component Value Date   HGBA1C 7.0  01/31/2020     Patient obtains medications through Adherence Packaging  30 Days   Last adherence delivery included:  Gabapentin 300 mg- 1 capsule three times daily (before breakfast, evening meal, bedtime) Pantoprazole 40 mg- 1 tablet twice daily( direction change)- (before breakfast, evening meal) Metoprolol 25 mg- 1 tablet daily (before breakfast) Torsemide 20 mg- 1 tablet daily (before breakfast) Allopurinol 100 mg- 1 tablet daily (breakfast) Eliquis 5 mg- 1 tablet twice daiy (breakfast and evening meal) Losartan 50 mg- 1 tablet daily (breakfast)    Patient is due for next adherence delivery on: 09-14-2020  Called patient and reviewed medications and coordinated delivery.  This delivery to include: Gabapentin 300 mg- 1 capsule three times daily (before breakfast, evening meal, bedtime) Pantoprazole 40 mg- 1 tablet twice daily( direction change)- (before breakfast, evening meal) Metoprolol 25 mg- 1 tablet daily (before breakfast) Torsemide 20 mg- 1 tablet daily (before breakfast) Allopurinol 100 mg- 1 tablet daily (breakfast) Eliquis 5 mg- 1 tablet twice daiy (breakfast and evening meal) Losartan 50 mg- 1 tablet daily (breakfast)  No acute or short fill needed  Patient declined the following medications: None  Patient needs refills for: None  Confirmed delivery date of 09-14-2020 advised patient that pharmacy will contact them the morning of delivery.   Care Gaps: Ophthalmology exam overdue Shingrix overdue Foot exam overdue Covid booster overdue Medicare wellness 08-22-2021 RAF= 5.913%  Star Rating Drugs: Losartan 50 mg- Last filled 08-08-2020 30[facility]  [place] Clinical Pharmacist Assistant [phone]

## 2020-09-07 ENCOUNTER — Telehealth: Payer: Medicare Other

## 2020-09-08 ENCOUNTER — Encounter: Payer: Self-pay | Admitting: Internal Medicine

## 2020-09-12 DIAGNOSIS — N1832 Chronic kidney disease, stage 3b: Secondary | ICD-10-CM | POA: Diagnosis not present

## 2020-09-12 DIAGNOSIS — G629 Polyneuropathy, unspecified: Secondary | ICD-10-CM | POA: Diagnosis not present

## 2020-09-12 DIAGNOSIS — K219 Gastro-esophageal reflux disease without esophagitis: Secondary | ICD-10-CM | POA: Diagnosis not present

## 2020-09-12 DIAGNOSIS — Z794 Long term (current) use of insulin: Secondary | ICD-10-CM | POA: Diagnosis not present

## 2020-09-12 DIAGNOSIS — J984 Other disorders of lung: Secondary | ICD-10-CM | POA: Diagnosis not present

## 2020-09-12 DIAGNOSIS — E139 Other specified diabetes mellitus without complications: Secondary | ICD-10-CM | POA: Diagnosis not present

## 2020-09-12 DIAGNOSIS — J3081 Allergic rhinitis due to animal (cat) (dog) hair and dander: Secondary | ICD-10-CM | POA: Diagnosis not present

## 2020-09-12 DIAGNOSIS — E1122 Type 2 diabetes mellitus with diabetic chronic kidney disease: Secondary | ICD-10-CM | POA: Diagnosis not present

## 2020-09-12 DIAGNOSIS — Z9109 Other allergy status, other than to drugs and biological substances: Secondary | ICD-10-CM | POA: Diagnosis not present

## 2020-09-12 DIAGNOSIS — R609 Edema, unspecified: Secondary | ICD-10-CM | POA: Diagnosis not present

## 2020-09-18 ENCOUNTER — Ambulatory Visit (INDEPENDENT_AMBULATORY_CARE_PROVIDER_SITE_OTHER): Payer: Medicare Other

## 2020-09-18 DIAGNOSIS — Z794 Long term (current) use of insulin: Secondary | ICD-10-CM

## 2020-09-18 DIAGNOSIS — N1831 Chronic kidney disease, stage 3a: Secondary | ICD-10-CM

## 2020-09-18 DIAGNOSIS — I272 Pulmonary hypertension, unspecified: Secondary | ICD-10-CM

## 2020-09-18 DIAGNOSIS — E1122 Type 2 diabetes mellitus with diabetic chronic kidney disease: Secondary | ICD-10-CM

## 2020-09-18 DIAGNOSIS — J984 Other disorders of lung: Secondary | ICD-10-CM | POA: Diagnosis not present

## 2020-09-18 NOTE — Chronic Care Management (AMB) (Signed)
Chronic Care Management    Social Work Note  09/18/2020 Name: Anna Clay MRN: [id number] DOB: [birth year]  Anna Clay is a 70 y.o. year old female who is a primary care patient of Anna Chard, MD. The CCM team was consulted to assist the patient with chronic disease management and/or care coordination needs related to:  DM II and Pulmonary Hypertension .   Engaged with patient by telephone for follow up visit in response to provider referral for social work chronic care management and care coordination services.   Consent to Services:  The patient was given information about Chronic Care Management services, agreed to services, and gave verbal consent prior to initiation of services.  Please see initial visit note for detailed documentation.   Patient agreed to services and consent obtained.   Assessment: Review of patient past medical history, allergies, medications, and health status, including review of relevant consultants reports was performed today as part of a comprehensive evaluation and provision of chronic care management and care coordination services.     SDOH (Social Determinants of Health) assessments and interventions performed:    Advanced Directives Status: Not addressed in this encounter.  CCM Care Plan  Allergies  Allergen Reactions   Allspice [Pimenta] Hives and Shortness Of Breath   Black Cohosh Hives and Shortness Of Breath   Peach [Prunus Persica] Hives and Shortness Of Breath    Fresh peaches   Heparin Other (See Comments)    Blood clots   Ivp Dye [Iodinated Diagnostic Agents] Other (See Comments)    Reaction unknown   Tetracyclines & Related Other (See Comments)    Reaction unknown    Outpatient Encounter Medications as of 09/18/2020  Medication Sig Note   Accu-Chek FastClix Lancets MISC USE AS DIRECTED TO CHECK BLOOD SUGARS 2 TIMES PER DAY DX:E11.65    Accu-Chek Softclix Lancets lancets Use to check blood sugars twice daily E11.69     acetaminophen (TYLENOL) 650 MG CR tablet Take 1,300 mg by mouth every 8 (eight) hours as needed for pain.    allopurinol (ZYLOPRIM) 100 MG tablet Take 1 tablet (100 mg total) by mouth daily.    aspirin EC 81 MG tablet Take 81 mg by mouth daily. (Patient not taking: No sig reported) 05/10/2020: Discontinued from the last hospital visit.    Blood Glucose Monitoring Suppl (ACCU-CHEK GUIDE) w/Device KIT USE AS DIRECTED TO CHECK BLOOD SUGARS 2 TIMES PER DAY DX:E11.65    calcium carbonate (TUMS - DOSED IN MG ELEMENTAL CALCIUM) 500 MG chewable tablet Chew 2 tablets by mouth daily. 2 per day    Cholecalciferol (VITAMIN D) 2000 UNITS CAPS Take 1 capsule by mouth daily.    colchicine 0.6 MG tablet Take 1 tablet (0.6 mg total) by mouth daily.    ELIQUIS 5 MG TABS tablet Take 1 tablet (5 mg total) by mouth 2 (two) times daily.    EPINEPHrine (EPIPEN 2-PAK) 0.3 mg/0.3 mL IJ SOAJ injection Inject 0.3 mLs (0.3 mg total) into the muscle as needed for anaphylaxis.    ferrous sulfate 325 (65 FE) MG EC tablet Take 325 mg by mouth daily.    fexofenadine (ALLEGRA) 180 MG tablet Take 180 mg by mouth daily.    folic acid (FOLVITE) 1 MG tablet TAKE ONE TABLET BY MOUTH DAILY    gabapentin (NEURONTIN) 300 MG capsule Take 1 capsule (300 mg total) by mouth 3 (three) times daily.    glucose blood (ACCU-CHEK GUIDE) test strip Use to check blood sugars twice  daily E11.69    insulin degludec (TRESIBA FLEXTOUCH) 200 UNIT/ML FlexTouch Pen Inject 26 Units into the skin at bedtime.    insulin lispro (HUMALOG KWIKPEN) 200 UNIT/ML KwikPen 6 units with breakfast, 6 units with lunch, and 8 units with dinner (Patient taking differently: 10 units with breakfast, 10 units with lunch, and 14 units with dinner)    losartan (COZAAR) 50 MG tablet Take by mouth.    magnesium oxide (MAG-OX) 400 MG tablet Take 400 mg by mouth daily.    metoprolol succinate (TOPROL-XL) 25 MG 24 hr tablet Take 1 tablet (25 mg total) by mouth daily.    Misc Natural  Products (ELDERBERRY ZINC/VIT C/IMMUNE MT) Use as directed 1 capsule in the mouth or throat daily at 12 noon.    Multiple Vitamin (MULTIVITAMIN WITH MINERALS) TABS tablet Take 1 tablet by mouth daily.    nitroGLYCERIN (NITROSTAT) 0.4 MG SL tablet Place 1 tablet (0.4 mg total) under the tongue every 5 (five) minutes as needed for chest pain.    NOVOFINE PEN NEEDLE 32G X 6 MM MISC USE 5 TO 8 NEEDLES PER DAY AS DIRECTED PER INSULIN PROTOCOL    oxyCODONE (OXY IR/ROXICODONE) 5 MG immediate release tablet Take by mouth.    pantoprazole (PROTONIX) 40 MG tablet Take 1 tablet (40 mg total) by mouth 2 (two) times daily.    Pitavastatin Calcium 4 MG TABS Take 1 tablet (4 mg total) by mouth daily.    torsemide (DEMADEX) 20 MG tablet Take 1 tablet (20 mg total) by mouth daily.    vitamin E 180 MG (400 UNITS) capsule Take 400 Units by mouth daily.    No facility-administered encounter medications on file as of 09/18/2020.    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   Thyroid nodule 06/18/2020   Gallstones 06/18/2020   Sickle cell trait ([place]) 08/18/2018   Iron deficiency anemia 10/24/2016   Allergic rhinitis 06/07/2014   OSA (obstructive sleep apnea) 10/05/2013   Diabetes mellitus ([place]) 10/05/2013   Anemia 10/05/2013   Pulmonary hypertension ([place]) 10/04/2013   Acute right-sided CHF (congestive heart failure) ([place]) 10/04/2013    Conditions to be addressed/monitored: DMII and Pulmonary Hypertension ; Transportation  Care Plan : Diabetes Type 2 (Adult)  Updates made by Anna Clay since 09/18/2020 12:00 AM  Completed 09/18/2020   Problem: Glycemic Management (Diabetes, Type 2) Resolved 09/18/2020  Priority: High  Note:   Goal closed due to patients plan to switch to a primary provider closer to home    Long-Range Goal: Glycemic Management Optimized Completed 09/18/2020  Start Date: 02/27/2020  Expected End Date: 02/26/2021  Recent Progress: On  track  Priority: High  Note:   Current Barriers:  Knowledge Deficits related to Self health management of Diabetes Chronic Disease Management support and education needs related to DM II, Pulmonary Hypertension, OSA  Nurse Case Manager Clinical Goal(s):  Patient will continue to engage with the CCM team and PCP for ongoing support and disease education to help improve her Self Health management of her DM as evidence patient will lower her A1c <7.0 CCM RN CM Interventions:  06/07/20 completed successful outbound call with patient  Evaluation of current treatment plan related to Diabetes and patient's adherence to plan as established by provider Reviewed patient's current A1c of 7.0; Educated on target A1c, daily glycemic control FBS 80-130, <180 after meals; Educated on 15'15' rule Determined patient is Self monitoring her blood sugars at home, with  average in the low 100's, although she did experience one low reading and several high readings after eating a light dinner one evening and splurging on fresh water melon Educated patient on dietary and exercise recommendations Educated with rationale importance of ongoing CBG monitoring daily before meals and at bedtime, and to notify the CCM team and or PCP for abnormal results Discussed plans with patient for ongoing care management follow up and provided patient with direct contact information for care management team Patient Self Care Activities:  Continue to monitor blood sugars and record as discussed Continue to follow dietary and exercise recommendations Patient will self administer medications as prescribed Patient will attend all scheduled provider appointments Patient will call pharmacy for medication refills Patient will call provider office for new concerns or questions Next Follow up date: 08/07/20     Care Plan : [place]  Updates made by Anna Clay since 09/18/2020 12:00 AM  Completed 09/18/2020   Problem: HTN,  HLD, DM II Resolved 09/18/2020  Priority: High  Note:   Goal closed due to patient plan to switch to a primary provider closer to home    Long-Range Goal: Disease Management Completed 09/18/2020  Recent Progress: On track  Priority: High  Note:   Current Barriers:  Does not adhere to prescribed medication regimen  Pharmacist Clinical Goal(s):  Patient will achieve adherence to monitoring guidelines and medication adherence to achieve therapeutic efficacy through collaboration with PharmD and provider.   Interventions: 1:1 collaboration with Anna Chard, MD regarding development and update of comprehensive plan of care as evidenced by provider attestation and co-signature Inter-disciplinary care team collaboration (see longitudinal plan of care) Comprehensive medication review performed; medication list updated in electronic medical record  Pulmonary  Hypertension (BP goal <130/80) -Controlled -Current treatment: Metoprolol succinate 25 mg - take 1 tablet by mouth daily  Torsemide 20 mg tablet by mouth daily  -Current home readings: she is checking her BP at least once per day. Her pressure has been running 140-144/ 72-90, 148/94  -Current dietary habits: patient reports that her eating habits are not good. She has not had an appetite recently. She only had a cup of coffee this morning. If she is using salt it is the light salt.  -Current exercise habits: patient reports that she is exercising but not routinely -Denies hypotensive/hypertensive symptoms -Educated on Daily salt intake goal < 2300 mg; Exercise goal of 150 minutes per week; Importance of home blood pressure monitoring; -Counseled to monitor BP at home at least once per day, document, and provide log at future appointments -Counseled on diet and exercise extensively Recommended to continue current medication  Hyperlipidemia: (LDL goal < 70) -Uncontrolled -Current treatment: Pitavastatin 4 mg tablet once daily   Aspirin 81 mg tablet daily  -Current dietary patterns:patient reports eating some fried and fatty foods, went to [employer] and had a burger, but did not eat the bread. She is eating more vegetables -Current exercise habits: she has not tried the silver sneakers program online -Current exercise habits: please see diabetes.  -Encouraged patient to eat more vegetables -Educated on Cholesterol goals;  Benefits of statin for ASCVD risk reduction; Importance of limiting foods high in cholesterol; Exercise goal of 150 minutes per week; -Recommended to continue current medication  Diabetes (A1c goal <7%) -Not ideally controlled -Current medications: Insulin Glargine 300 unit/ml - inject 58 units into the skin at bedtime  Insulin Lispro 200 unit/ml - 6 units with breakfast, 6 units with lunch and  8 units with dinner.  -Current home glucose readings- patient reports that she checked her BS 4 times this morning. She checked it with her freestyle monitor and her finger stick   CGM: patient reports they have been running too high or too low. The lowest she had was 62, her alarm woke her up, this occurred last week, the second time it was 54.  -Denies hypoglycemic/hyperglycemic symptoms -We discussed her CGM numbers in details and goals  -Current meal patterns: Patient reports that they have the frozen vegetables on the list.  -Current exercise: patient has not started exercising yet.  -Educated on A1c and blood sugar goals; Exercise goal of 150 minutes per week; Benefits of weight loss; Prevention and management of hypoglycemic episodes; Benefits of routine self-monitoring of blood sugar; -Counseled to check feet daily and get yearly eye exams -Recommended to continue current medication  Health Maintenance -Vaccine gaps:              COVID -19 Booster:  Will get booster at [facility] this weekend      -Will confirm patient received vaccine through [facility] registry  -Patient is satisfied with  current therapy and denies issues -Recommended to continue current medication  Patient Goals/Self-Care Activities Patient will:  - take medications as prescribed  Follow Up Plan: The patient has been provided with contact information for the care management team and has been advised to call with any health related questions or concerns.     Care Plan : Social Work [place]  Updates made by Anna Clay since 09/18/2020 12:00 AM  Completed 09/18/2020   Problem: Barriers to Treatment Resolved 09/18/2020     Long-Range Goal: Barriers to Treatment Identified and Managed Completed 09/18/2020  Start Date: 06/07/2020  Expected End Date: 10/05/2020  Recent Progress: On track  Priority: High  Note:   Current Barriers:  Chronic disease management support and education needs related to DM and Pulmonary Hypertension   Transportation  Social Worker Clinical Goal(s):  work with SW to address transportation barriers in order for patient to attend physician appointments  SW Interventions:  Inter-disciplinary care team collaboration (see longitudinal plan of care) Collaboration with Anna Chard, MD regarding development and update of comprehensive plan of care as evidenced by provider attestation and co-signature Successful outbound call placed to the patient to assist with care coordination needs Discussed the patient was recently seen by Dr. Anderson [place] Day with [facility] to establish care Patient reports she enjoyed this office and will plan to have Dr. Elana Alm be her primary provider due to transportation barriers for out of county needs Assessed for transportation barriers to see newly established primary provider - patient reports she has the ability to access county transportation for these appointments Patient has successfully utilized this transportation service and is ware how to access in the future Discussed plans for SW to close patient to care management  program - patient stated understanding Patient did report Dr. Elana Alm would like to send her to an endocrinologist which the patient has spoken with Dr. Baird Cancer about recently - patient states she requested Dr. Elana Alm collaborate with Dr. Baird Cancer regarding referral needs Collaboration with Dr. Baird Cancer and embedded care management team to advise of patients plan to receive primary care from Dr. Elana Alm  Patient Goals/Self-Care Activities patient will:   - Utilize in county transportation to assist with appointments to see Dr. Elana Alm  Follow Up Plan:  Collaboration with care management team to advise of plan to  perform a goal closure     Care Plan : Iron Deficiency Anemia  Updates made by Anna Clay since 09/18/2020 12:00 AM  Completed 09/18/2020   Problem: Iron Deficiency Anemia Resolved 09/18/2020  Priority: High  Note:   Goal closed due to patient plan to switch to a primary provider closer to home    Long-Range Goal: Iron Deficiency Anemia - complications minimized or prevented Completed 09/18/2020  Start Date: 06/07/2020  Expected End Date: 06/07/2021  Recent Progress: On track  Priority: High  Note:   Current Barriers:  Ineffective Self Health Maintenance  Clinical Goal(s):  Collaboration with Anna Chard, MD regarding development and update of comprehensive plan of care as evidenced by provider attestation and co-signature Inter-disciplinary care team collaboration (see longitudinal plan of care) patient will work with care management team to address care coordination and chronic disease management needs related to Disease Management Educational Needs Care Coordination Medication Management and Education Psychosocial Support   Interventions:  06/07/20 completed successful outbound call with patient  Evaluation of current treatment plan related to  Iron Deficiency Anemia  , self-management and patient's adherence to plan as established by provider. Collaboration with Anna Chard, MD  regarding development and update of comprehensive plan of care as evidenced by provider attestation       and co-signature Inter-disciplinary care team collaboration (see longitudinal plan of care) Provided education to patient about basic disease process related to Anemia  Review of patient status, including review of consultant's reports, relevant laboratory and other test results, and medications completed. Reviewed medications with patient and discussed importance of medication adherence Determined Dr. Juel Burrow with [facility] Oncologist is managing patient's Anemia Discussed and reviewed his recommendations for Anemia management including oral supplementation, blood transfusions and iron infusions when needed, patient verbalizes understanding and reports adherence Discussed next scheduled lab check for iron panel is scheduled at [facility] in 1 month  Discussed plans with patient for ongoing care management follow up and provided patient with direct contact information for care management team Self Care Activities:  Continue to keep all scheduled follow up appointments Take medications as directed  Let your healthcare team know if you are unable to take your medications Call your pharmacy for refills at least 7 days prior to running out of medication Notify your healthcare team promptly to report symptoms suggestive of worsening Anemia as discussed  Patient Goals: - prevent complications from Anemia   Follow Up Plan: Telephone follow up appointment with care management team member scheduled for: 08/07/20       Follow Up Plan:  No patient follow up planned at this time as the patient has chosen to transition to a primary care provider closer to home.       Anna Clay, BSW, CDP Social Worker, Certified Dementia Practitioner [place] [facility] [place] [facility] [phone]

## 2020-09-18 NOTE — Patient Instructions (Signed)
Social Worker Visit Information  Goals we discussed today:   Goals Addressed             This Visit's Progress    COMPLETED: Barriers to Treatment Identified and Managed       Timeframe:  Long-Range Goal Priority:  High Start Date:  5.19.22                           Expected End Date:   9.16.22                     Patient Goals/Self-Care Activities patient will:   - [place] transportation for future appointments to see Dr. Elana Alm     COMPLETED: Diabetes treatment optimized       Timeframe:  Long-Range Goal Priority:  High Start Date:  02/27/20                           Expected End Date:  02/26/21  Next Follow Up Date: 08/07/20  Continue to monitor blood sugars and record as discussed Continue to follow dietary and exercise recommendations Patient will self administer medications as prescribed Patient will attend all scheduled provider appointments Patient will call pharmacy for medication refills Patient will call provider office for new concerns or questions                          COMPLETED: Iron Deficiency Anemia - complications minimized or prevented       Timeframe:  Long-Range Goal Priority:  High Start Date:  06/07/20                           Expected End Date:  06/07/21     Next Scheduled Follow up date: 08/07/20      Self Care Activities:  Continue to keep all scheduled follow up appointments Take medications as directed  Let your healthcare team know if you are unable to take your medications Call your pharmacy for refills at least 7 days prior to running out of medication Notify your healthcare team promptly to report symptoms suggestive of worsening Anemia as discussed  Patient Goals: - prevent complications from Anemia                   COMPLETED: Manage My Medicine       Timeframe:  Long-Range Goal Priority:  High Start Date:                             Expected End Date:                       Follow Up Date 10/02/2020   - call for medicine  refill 2 or 3 days before it runs out - keep a list of all the medicines I take; vitamins and herbals too - learn to read medicine labels - use a pillbox to sort medicine    Why is this important?   These steps will help you keep on track with your medicines.        COMPLETED: Pharmacy Care Plan       CARE PLAN ENTRY  Current Barriers:  Chronic Disease Management support, education, and care coordination needs related to Hypertension, Hyperlipidemia, and Diabetes   Hypertension Pharmacist Clinical  Goal(s): Over the next 90 days, patient will work with PharmD and providers to achieve BP goal <130/80 Current regimen:  Amlodipine 10mg  daily Interventions: Will discuss hypertensive regimen with PCP following nurse BP check on 6/1 Patient self care activities - Over the next 90 days, patient will: Check BP once, document, and provide at future appointments Ensure daily salt intake < 2300 mg/day  Hyperlipidemia Pharmacist Clinical Goal(s): Over the next 90 days, patient will work with PharmD and providers to achieve LDL goal < 70 Current regimen:  Pitavastatin 4mg  daily Interventions: Contacted PCP to send in refills, patient is out of medication Patient self care activities - Over the next 90 days, patient will: Resume taking medication daily  Diabetes Pharmacist Clinical Goal(s): Over the next 90 days, patient will work with PharmD and providers to maintain A1c goal <7% Current regimen:  Toujeo 58 units at bedtime Humalog Kwikpen 6 units at breakfast, 6 units at lunch, 8 units at dinner Interventions: Will investigate CGM coverage and options on insurance Patient self care activities - Over the next 90 days, patient will: Check blood sugar 3-4 times daily, document, and provide at future appointments Contact provider with any episodes of hypoglycemia  Medication management Pharmacist Clinical Goal(s): Over the next 90 days, patient will work with PharmD and providers to  achieve optimal medication adherence [facility]: [facility] Interventions Comprehensive medication review performed. Utilize [facility] pharmacy for medication synchronization, packaging and delivery Patient self care activities - Over the next 90 days, patient will: Focus on medication adherence by synchronization of medications and utilization of adherence packaging Take medications as prescribed Report any questions or concerns to PharmD and/or provider(s)  Initial goal documentation          Follow Up Plan:  No follow up planned at this time. It has been a pleasure working with you!    Daneen Schick, BSW, CDP Social Worker, Certified Dementia Practitioner [place] [facility] [place] [facility] [phone]

## 2020-09-27 ENCOUNTER — Telehealth: Payer: Medicare Other

## 2020-09-28 DIAGNOSIS — Z794 Long term (current) use of insulin: Secondary | ICD-10-CM | POA: Diagnosis not present

## 2020-09-28 DIAGNOSIS — N1832 Chronic kidney disease, stage 3b: Secondary | ICD-10-CM | POA: Diagnosis not present

## 2020-09-28 DIAGNOSIS — E1122 Type 2 diabetes mellitus with diabetic chronic kidney disease: Secondary | ICD-10-CM | POA: Diagnosis not present

## 2020-09-28 DIAGNOSIS — G629 Polyneuropathy, unspecified: Secondary | ICD-10-CM | POA: Diagnosis not present

## 2020-09-28 DIAGNOSIS — R809 Proteinuria, unspecified: Secondary | ICD-10-CM | POA: Diagnosis not present

## 2020-10-02 ENCOUNTER — Telehealth: Payer: Self-pay

## 2020-10-04 ENCOUNTER — Telehealth: Payer: Self-pay

## 2020-10-04 NOTE — Chronic Care Management (AMB) (Signed)
Chronic Care Management Pharmacy Assistant   Name: Anna Clay  MRN: [id number] DOB: [birth year]   Reason for Encounter: Medication Review/ Medication coordination  Recent office visits:  10-19-2020 Daneen Schick (CCM)  Recent consult visits:  09-12-2020 Day, Marveen Reeks, MD (Respiratory Therapy). Referral placed to diabetes management. STOP Musinex, Toujeo and Na Ferric Gluc Cplx in [place].   Hospital visits:  None in previous 6 months  Medications: Outpatient Encounter Medications as of 10/04/2020  Medication Sig Note   Accu-Chek FastClix Lancets MISC USE AS DIRECTED TO CHECK BLOOD SUGARS 2 TIMES PER DAY DX:E11.65    Accu-Chek Softclix Lancets lancets Use to check blood sugars twice daily E11.69    acetaminophen (TYLENOL) 650 MG CR tablet Take 1,300 mg by mouth every 8 (eight) hours as needed for pain.    allopurinol (ZYLOPRIM) 100 MG tablet Take 1 tablet (100 mg total) by mouth daily.    aspirin EC 81 MG tablet Take 81 mg by mouth daily. (Patient not taking: No sig reported) 05/10/2020: Discontinued from the last hospital visit.    Blood Glucose Monitoring Suppl (ACCU-CHEK GUIDE) w/Device KIT USE AS DIRECTED TO CHECK BLOOD SUGARS 2 TIMES PER DAY DX:E11.65    calcium carbonate (TUMS - DOSED IN MG ELEMENTAL CALCIUM) 500 MG chewable tablet Chew 2 tablets by mouth daily. 2 per day    Cholecalciferol (VITAMIN D) 2000 UNITS CAPS Take 1 capsule by mouth daily.    colchicine 0.6 MG tablet Take 1 tablet (0.6 mg total) by mouth daily.    ELIQUIS 5 MG TABS tablet Take 1 tablet (5 mg total) by mouth 2 (two) times daily.    EPINEPHrine (EPIPEN 2-PAK) 0.3 mg/0.3 mL IJ SOAJ injection Inject 0.3 mLs (0.3 mg total) into the muscle as needed for anaphylaxis.    ferrous sulfate 325 (65 FE) MG EC tablet Take 325 mg by mouth daily.    fexofenadine (ALLEGRA) 180 MG tablet Take 180 mg by mouth daily.    folic acid (FOLVITE) 1 MG tablet TAKE ONE TABLET BY MOUTH DAILY    gabapentin (NEURONTIN) 300 MG  capsule Take 1 capsule (300 mg total) by mouth 3 (three) times daily.    glucose blood (ACCU-CHEK GUIDE) test strip Use to check blood sugars twice daily E11.69    insulin degludec (TRESIBA FLEXTOUCH) 200 UNIT/ML FlexTouch Pen Inject 26 Units into the skin at bedtime.    insulin lispro (HUMALOG KWIKPEN) 200 UNIT/ML KwikPen 6 units with breakfast, 6 units with lunch, and 8 units with dinner (Patient taking differently: 10 units with breakfast, 10 units with lunch, and 14 units with dinner)    losartan (COZAAR) 50 MG tablet Take by mouth.    magnesium oxide (MAG-OX) 400 MG tablet Take 400 mg by mouth daily.    metoprolol succinate (TOPROL-XL) 25 MG 24 hr tablet Take 1 tablet (25 mg total) by mouth daily.    Misc Natural Products (ELDERBERRY ZINC/VIT C/IMMUNE MT) Use as directed 1 capsule in the mouth or throat daily at 12 noon.    Multiple Vitamin (MULTIVITAMIN WITH MINERALS) TABS tablet Take 1 tablet by mouth daily.    nitroGLYCERIN (NITROSTAT) 0.4 MG SL tablet Place 1 tablet (0.4 mg total) under the tongue every 5 (five) minutes as needed for chest pain.    NOVOFINE PEN NEEDLE 32G X 6 MM MISC USE 5 TO 8 NEEDLES PER DAY AS DIRECTED PER INSULIN PROTOCOL    oxyCODONE (OXY IR/ROXICODONE) 5 MG immediate release tablet Take by mouth.  pantoprazole (PROTONIX) 40 MG tablet Take 1 tablet (40 mg total) by mouth 2 (two) times daily.    Pitavastatin Calcium 4 MG TABS Take 1 tablet (4 mg total) by mouth daily.    torsemide (DEMADEX) 20 MG tablet Take 1 tablet (20 mg total) by mouth daily.    vitamin E 180 MG (400 UNITS) capsule Take 400 Units by mouth daily.    No facility-administered encounter medications on file as of 10/04/2020.   Reviewed chart for medication changes ahead of medication coordination call.  No OVs, Consults, or hospital visits since last care coordination call/Pharmacist visit. (If appropriate, list visit date, provider name)  No medication changes indicated OR if recent visit,  treatment plan here.  BP Readings from Last 3 Encounters:  06/06/20 (!) 150/90  04/12/20 138/72  01/30/20 (!) 165/100    Lab Results  Component Value Date   HGBA1C 7.0 01/31/2020     Patient obtains medications through Adherence Packaging  30 Days   Last adherence delivery included:  Gabapentin 300 mg- 1 capsule three times daily (before breakfast, evening meal, bedtime) Pantoprazole 40 mg- 1 tablet twice daily( direction change)- (before breakfast, evening meal) Metoprolol 25 mg- 1 tablet daily (before breakfast) Torsemide 20 mg- 1 tablet daily (before breakfast) Allopurinol 100 mg- 1 tablet daily (breakfast) Eliquis 5 mg- 1 tablet twice daiy (breakfast and evening meal) Losartan 50 mg- 1 tablet daily (breakfast)   Patient declined (meds) last month: None  Patient is due for next adherence delivery on: 10-15-2020  Called patient and reviewed medications and coordinated delivery.  This delivery to include: Humalog Kwikpen- 12 units at breakfast, 12 units at lunch, 14 units at dinner Losartan 50 mg- 1 tablet daily (breakfast) Metoprolol 25 mg- 1 tablet daily (before breakfast) Torsemide 20 mg- 1 tablet daily (before breakfast) Allopurinol 100 mg- 1 tablet daily (breakfast) Eliquis 5 mg- 1 tablet twice daiy (breakfast and evening meal) Gabapentin 300 mg- 1 capsule three times daily (before breakfast, evening meal, bedtime) Pantoprazole 40 mg- 1 tablet twice daily( direction change)- (before breakfast, evening meal) Tresiba 200 units- 26 units at bedtime  No acute/ short fills needed  Patient needs refills for: Eliquis 5 mg twice daily  Confirmed delivery date of  advised patient that pharmacy will contact them the morning of delivery.  NOTES: Patient states she has a different primary care and is no longer with [facility]. Called and left a voicemail for refills from PCP Dr. Anderson Malta Day  Care Gaps: Ophthalmology exam overdue Shingrix overdue Foot exam overdue Covid  booster overdue Missed Medicare wellness 08-22-2021 RAF= 5.913%  Star Rating Drugs: Losartan 50 mg- Last filled 09-10-2020 [facility] Clinical Pharmacist Assistant [phone]

## 2020-10-11 ENCOUNTER — Other Ambulatory Visit: Payer: Self-pay

## 2020-10-11 MED ORDER — HUMALOG KWIKPEN 200 UNIT/ML ~~LOC~~ SOPN: PEN_INJECTOR | SUBCUTANEOUS | 1 refills | Status: AC

## 2020-10-22 DIAGNOSIS — Z794 Long term (current) use of insulin: Secondary | ICD-10-CM | POA: Diagnosis not present

## 2020-10-22 DIAGNOSIS — E1122 Type 2 diabetes mellitus with diabetic chronic kidney disease: Secondary | ICD-10-CM | POA: Diagnosis not present

## 2020-10-22 DIAGNOSIS — N1832 Chronic kidney disease, stage 3b: Secondary | ICD-10-CM | POA: Diagnosis not present

## 2020-11-01 DIAGNOSIS — I5032 Chronic diastolic (congestive) heart failure: Secondary | ICD-10-CM | POA: Diagnosis not present

## 2020-11-01 DIAGNOSIS — E1122 Type 2 diabetes mellitus with diabetic chronic kidney disease: Secondary | ICD-10-CM | POA: Diagnosis not present

## 2020-11-01 DIAGNOSIS — Z794 Long term (current) use of insulin: Secondary | ICD-10-CM | POA: Diagnosis not present

## 2020-11-01 DIAGNOSIS — N250 Renal osteodystrophy: Secondary | ICD-10-CM | POA: Diagnosis not present

## 2020-11-01 DIAGNOSIS — D649 Anemia, unspecified: Secondary | ICD-10-CM | POA: Diagnosis not present

## 2020-11-01 DIAGNOSIS — I10 Essential (primary) hypertension: Secondary | ICD-10-CM | POA: Diagnosis not present

## 2020-11-01 DIAGNOSIS — N1832 Chronic kidney disease, stage 3b: Secondary | ICD-10-CM | POA: Diagnosis not present

## 2020-11-06 ENCOUNTER — Telehealth: Payer: Self-pay

## 2020-11-06 NOTE — Chronic Care Management (AMB) (Addendum)
Chronic Care Management Pharmacy Assistant   Name: Anna Clay  MRN: [id number] DOB: 02-02-1950  Reason for Encounter: Medication Review/ Medication Coordination Call   Recent office visits:  10/11/2020- Telephone Encounter (PCP)- Insulin Lispro 200 u/ml changed to 12 units with breakfast, 12 units with lunch and 14 units with dinner.  Recent consult visits:  None  Hospital visits:  None in previous 6 months  Reviewed chart for medication changes ahead of medication coordination call.  No OVs, Consults, or hospital visits since last care coordination call/Pharmacist visit.  Medication changes indicated above.  BP Readings from Last 3 Encounters:  06/06/20 (!) 150/90  04/12/20 138/72  01/30/20 (!) 165/100    Lab Results  Component Value Date   HGBA1C 7.0 01/31/2020     Patient obtains medications through Adherence Packaging  30 Days   Last adherence delivery included:  Humalog Kwikpen- 12 units at breakfast, 12 units at lunch, 14 units at dinner Losartan 50 mg- 1 tablet daily (breakfast) Metoprolol 25 mg- 1 tablet daily (before breakfast) Torsemide 20 mg- 1 tablet daily (before breakfast) Allopurinol 100 mg- 1 tablet daily (breakfast) Eliquis 5 mg- 1 tablet twice daiy (breakfast and evening meal) Gabapentin 300 mg- 1 capsule three times daily (before breakfast, evening meal, bedtime) Pantoprazole 40 mg- 1 tablet twice daily( direction change)- (before breakfast, evening meal) Tresiba 200 units- 26 units at bedtime  Patient declined the following medications last month: None  Patient is due for next adherence delivery on: 11/14/2020.  Noticed in last call patient has a new PCP and per Jeannette How, CMA patient will continue with [facility]. Orlando Penner, CPP notified.    Medications: Outpatient Encounter Medications as of 11/06/2020  Medication Sig Note   Accu-Chek FastClix Lancets MISC USE AS DIRECTED TO CHECK BLOOD SUGARS 2 TIMES PER DAY DX:E11.65     Accu-Chek Softclix Lancets lancets Use to check blood sugars twice daily E11.69    acetaminophen (TYLENOL) 650 MG CR tablet Take 1,300 mg by mouth every 8 (eight) hours as needed for pain.    allopurinol (ZYLOPRIM) 100 MG tablet Take 1 tablet (100 mg total) by mouth daily.    aspirin EC 81 MG tablet Take 81 mg by mouth daily. (Patient not taking: No sig reported) 05/10/2020: Discontinued from the last hospital visit.    Blood Glucose Monitoring Suppl (ACCU-CHEK GUIDE) w/Device KIT USE AS DIRECTED TO CHECK BLOOD SUGARS 2 TIMES PER DAY DX:E11.65    calcium carbonate (TUMS - DOSED IN MG ELEMENTAL CALCIUM) 500 MG chewable tablet Chew 2 tablets by mouth daily. 2 per day    Cholecalciferol (VITAMIN D) 2000 UNITS CAPS Take 1 capsule by mouth daily.    colchicine 0.6 MG tablet Take 1 tablet (0.6 mg total) by mouth daily.    ELIQUIS 5 MG TABS tablet Take 1 tablet (5 mg total) by mouth 2 (two) times daily.    EPINEPHrine (EPIPEN 2-PAK) 0.3 mg/0.3 mL IJ SOAJ injection Inject 0.3 mLs (0.3 mg total) into the muscle as needed for anaphylaxis.    ferrous sulfate 325 (65 FE) MG EC tablet Take 325 mg by mouth daily.    fexofenadine (ALLEGRA) 180 MG tablet Take 180 mg by mouth daily.    folic acid (FOLVITE) 1 MG tablet TAKE ONE TABLET BY MOUTH DAILY    gabapentin (NEURONTIN) 300 MG capsule Take 1 capsule (300 mg total) by mouth 3 (three) times daily.    glucose blood (ACCU-CHEK GUIDE) test strip Use to check  blood sugars twice daily E11.69    insulin degludec (TRESIBA FLEXTOUCH) 200 UNIT/ML FlexTouch Pen Inject 26 Units into the skin at bedtime.    insulin lispro (HUMALOG KWIKPEN) 200 UNIT/ML KwikPen 12 units with breakfast, 12 units with lunch, and 14 units with dinner    losartan (COZAAR) 50 MG tablet Take by mouth.    magnesium oxide (MAG-OX) 400 MG tablet Take 400 mg by mouth daily.    metoprolol succinate (TOPROL-XL) 25 MG 24 hr tablet Take 1 tablet (25 mg total) by mouth daily.    Misc Natural Products  (ELDERBERRY ZINC/VIT C/IMMUNE MT) Use as directed 1 capsule in the mouth or throat daily at 12 noon.    Multiple Vitamin (MULTIVITAMIN WITH MINERALS) TABS tablet Take 1 tablet by mouth daily.    nitroGLYCERIN (NITROSTAT) 0.4 MG SL tablet Place 1 tablet (0.4 mg total) under the tongue every 5 (five) minutes as needed for chest pain.    NOVOFINE PEN NEEDLE 32G X 6 MM MISC USE 5 TO 8 NEEDLES PER DAY AS DIRECTED PER INSULIN PROTOCOL    oxyCODONE (OXY IR/ROXICODONE) 5 MG immediate release tablet Take by mouth.    pantoprazole (PROTONIX) 40 MG tablet Take 1 tablet (40 mg total) by mouth 2 (two) times daily.    Pitavastatin Calcium 4 MG TABS Take 1 tablet (4 mg total) by mouth daily.    torsemide (DEMADEX) 20 MG tablet Take 1 tablet (20 mg total) by mouth daily.    vitamin E 180 MG (400 UNITS) capsule Take 400 Units by mouth daily.    No facility-administered encounter medications on file as of 11/06/2020.  Pattricia Boss, [facility] Pharmacist Assistant [phone]

## 2020-11-07 ENCOUNTER — Other Ambulatory Visit: Payer: Self-pay | Admitting: Internal Medicine

## 2020-11-07 DIAGNOSIS — G629 Polyneuropathy, unspecified: Secondary | ICD-10-CM

## 2020-11-12 DIAGNOSIS — N1832 Chronic kidney disease, stage 3b: Secondary | ICD-10-CM | POA: Diagnosis not present

## 2020-11-12 DIAGNOSIS — E1122 Type 2 diabetes mellitus with diabetic chronic kidney disease: Secondary | ICD-10-CM | POA: Diagnosis not present

## 2020-11-12 DIAGNOSIS — Z794 Long term (current) use of insulin: Secondary | ICD-10-CM | POA: Diagnosis not present

## 2020-11-14 DIAGNOSIS — Z23 Encounter for immunization: Secondary | ICD-10-CM | POA: Diagnosis not present

## 2020-11-14 DIAGNOSIS — R000 Tachycardia, unspecified: Secondary | ICD-10-CM | POA: Diagnosis not present

## 2020-11-14 DIAGNOSIS — I10 Essential (primary) hypertension: Secondary | ICD-10-CM | POA: Diagnosis not present

## 2020-11-14 DIAGNOSIS — G629 Polyneuropathy, unspecified: Secondary | ICD-10-CM | POA: Diagnosis not present

## 2020-11-14 DIAGNOSIS — Z1239 Encounter for other screening for malignant neoplasm of breast: Secondary | ICD-10-CM | POA: Diagnosis not present

## 2020-11-19 DIAGNOSIS — E119 Type 2 diabetes mellitus without complications: Secondary | ICD-10-CM | POA: Diagnosis not present

## 2020-11-19 DIAGNOSIS — H35371 Puckering of macula, right eye: Secondary | ICD-10-CM | POA: Diagnosis not present

## 2020-11-19 DIAGNOSIS — H35342 Macular cyst, hole, or pseudohole, left eye: Secondary | ICD-10-CM | POA: Diagnosis not present

## 2020-11-19 DIAGNOSIS — H524 Presbyopia: Secondary | ICD-10-CM | POA: Diagnosis not present

## 2020-11-19 DIAGNOSIS — H16223 Keratoconjunctivitis sicca, not specified as Sjogren's, bilateral: Secondary | ICD-10-CM | POA: Diagnosis not present

## 2020-11-19 DIAGNOSIS — Z961 Presence of intraocular lens: Secondary | ICD-10-CM | POA: Diagnosis not present

## 2020-11-19 DIAGNOSIS — H52223 Regular astigmatism, bilateral: Secondary | ICD-10-CM | POA: Diagnosis not present

## 2020-11-19 DIAGNOSIS — H5203 Hypermetropia, bilateral: Secondary | ICD-10-CM | POA: Diagnosis not present

## 2020-11-19 DIAGNOSIS — H2511 Age-related nuclear cataract, right eye: Secondary | ICD-10-CM | POA: Diagnosis not present

## 2020-11-27 ENCOUNTER — Other Ambulatory Visit: Payer: Self-pay | Admitting: Internal Medicine

## 2020-11-30 DIAGNOSIS — Z1231 Encounter for screening mammogram for malignant neoplasm of breast: Secondary | ICD-10-CM | POA: Diagnosis not present

## 2020-12-04 ENCOUNTER — Other Ambulatory Visit: Payer: Self-pay | Admitting: Internal Medicine

## 2020-12-04 DIAGNOSIS — G629 Polyneuropathy, unspecified: Secondary | ICD-10-CM

## 2020-12-10 DIAGNOSIS — G629 Polyneuropathy, unspecified: Secondary | ICD-10-CM | POA: Diagnosis not present

## 2020-12-24 DIAGNOSIS — Z9989 Dependence on other enabling machines and devices: Secondary | ICD-10-CM | POA: Diagnosis not present

## 2020-12-24 DIAGNOSIS — R911 Solitary pulmonary nodule: Secondary | ICD-10-CM | POA: Diagnosis not present

## 2020-12-24 DIAGNOSIS — F17211 Nicotine dependence, cigarettes, in remission: Secondary | ICD-10-CM | POA: Diagnosis not present

## 2020-12-24 DIAGNOSIS — Z122 Encounter for screening for malignant neoplasm of respiratory organs: Secondary | ICD-10-CM | POA: Diagnosis not present

## 2020-12-24 DIAGNOSIS — G4733 Obstructive sleep apnea (adult) (pediatric): Secondary | ICD-10-CM | POA: Diagnosis not present

## 2020-12-24 DIAGNOSIS — J432 Centrilobular emphysema: Secondary | ICD-10-CM | POA: Diagnosis not present

## 2020-12-24 DIAGNOSIS — J9611 Chronic respiratory failure with hypoxia: Secondary | ICD-10-CM | POA: Diagnosis not present

## 2020-12-24 DIAGNOSIS — Z9981 Dependence on supplemental oxygen: Secondary | ICD-10-CM | POA: Diagnosis not present

## 2020-12-25 ENCOUNTER — Other Ambulatory Visit: Payer: Self-pay | Admitting: Internal Medicine

## 2021-01-01 DIAGNOSIS — Z794 Long term (current) use of insulin: Secondary | ICD-10-CM | POA: Diagnosis not present

## 2021-01-01 DIAGNOSIS — E1122 Type 2 diabetes mellitus with diabetic chronic kidney disease: Secondary | ICD-10-CM | POA: Diagnosis not present

## 2021-01-01 DIAGNOSIS — G629 Polyneuropathy, unspecified: Secondary | ICD-10-CM | POA: Diagnosis not present

## 2021-01-01 DIAGNOSIS — N1832 Chronic kidney disease, stage 3b: Secondary | ICD-10-CM | POA: Diagnosis not present

## 2021-01-04 ENCOUNTER — Other Ambulatory Visit: Payer: Self-pay | Admitting: Internal Medicine

## 2021-01-09 DIAGNOSIS — Z794 Long term (current) use of insulin: Secondary | ICD-10-CM | POA: Diagnosis not present

## 2021-01-09 DIAGNOSIS — I10 Essential (primary) hypertension: Secondary | ICD-10-CM | POA: Diagnosis not present

## 2021-01-09 DIAGNOSIS — N1832 Chronic kidney disease, stage 3b: Secondary | ICD-10-CM | POA: Diagnosis not present

## 2021-01-09 DIAGNOSIS — E1122 Type 2 diabetes mellitus with diabetic chronic kidney disease: Secondary | ICD-10-CM | POA: Diagnosis not present

## 2021-01-09 DIAGNOSIS — E782 Mixed hyperlipidemia: Secondary | ICD-10-CM | POA: Diagnosis not present

## 2021-01-10 ENCOUNTER — Other Ambulatory Visit: Payer: Self-pay | Admitting: Internal Medicine

## 2021-01-30 ENCOUNTER — Other Ambulatory Visit: Payer: Self-pay | Admitting: Internal Medicine

## 2021-02-08 DIAGNOSIS — G629 Polyneuropathy, unspecified: Secondary | ICD-10-CM | POA: Diagnosis not present

## 2021-02-08 DIAGNOSIS — I10 Essential (primary) hypertension: Secondary | ICD-10-CM | POA: Diagnosis not present

## 2021-02-25 DIAGNOSIS — G629 Polyneuropathy, unspecified: Secondary | ICD-10-CM | POA: Diagnosis not present

## 2021-02-27 ENCOUNTER — Other Ambulatory Visit: Payer: Self-pay | Admitting: Internal Medicine

## 2021-03-06 DIAGNOSIS — Z794 Long term (current) use of insulin: Secondary | ICD-10-CM | POA: Diagnosis not present

## 2021-03-06 DIAGNOSIS — N1832 Chronic kidney disease, stage 3b: Secondary | ICD-10-CM | POA: Diagnosis not present

## 2021-03-06 DIAGNOSIS — I10 Essential (primary) hypertension: Secondary | ICD-10-CM | POA: Diagnosis not present

## 2021-03-06 DIAGNOSIS — I5032 Chronic diastolic (congestive) heart failure: Secondary | ICD-10-CM | POA: Diagnosis not present

## 2021-03-06 DIAGNOSIS — D649 Anemia, unspecified: Secondary | ICD-10-CM | POA: Diagnosis not present

## 2021-03-06 DIAGNOSIS — N250 Renal osteodystrophy: Secondary | ICD-10-CM | POA: Diagnosis not present

## 2021-03-06 DIAGNOSIS — E1122 Type 2 diabetes mellitus with diabetic chronic kidney disease: Secondary | ICD-10-CM | POA: Diagnosis not present

## 2021-03-13 ENCOUNTER — Other Ambulatory Visit: Payer: Self-pay | Admitting: Internal Medicine

## 2021-04-02 DIAGNOSIS — E1122 Type 2 diabetes mellitus with diabetic chronic kidney disease: Secondary | ICD-10-CM | POA: Diagnosis not present

## 2021-04-02 DIAGNOSIS — Z794 Long term (current) use of insulin: Secondary | ICD-10-CM | POA: Diagnosis not present

## 2021-04-02 DIAGNOSIS — N1832 Chronic kidney disease, stage 3b: Secondary | ICD-10-CM | POA: Diagnosis not present

## 2021-04-08 ENCOUNTER — Other Ambulatory Visit: Payer: Self-pay | Admitting: Internal Medicine

## 2021-04-25 DIAGNOSIS — B351 Tinea unguium: Secondary | ICD-10-CM | POA: Diagnosis not present

## 2021-04-25 DIAGNOSIS — E1122 Type 2 diabetes mellitus with diabetic chronic kidney disease: Secondary | ICD-10-CM | POA: Diagnosis not present

## 2021-04-25 DIAGNOSIS — Z794 Long term (current) use of insulin: Secondary | ICD-10-CM | POA: Diagnosis not present

## 2021-04-25 DIAGNOSIS — N1832 Chronic kidney disease, stage 3b: Secondary | ICD-10-CM | POA: Diagnosis not present

## 2021-06-12 ENCOUNTER — Other Ambulatory Visit: Payer: Self-pay | Admitting: Internal Medicine

## 2021-06-30 ENCOUNTER — Other Ambulatory Visit: Payer: Self-pay | Admitting: Internal Medicine

## 2021-07-04 ENCOUNTER — Other Ambulatory Visit: Payer: Self-pay | Admitting: Internal Medicine

## 2021-07-05 ENCOUNTER — Other Ambulatory Visit: Payer: Self-pay | Admitting: Internal Medicine

## 2021-07-09 ENCOUNTER — Other Ambulatory Visit: Payer: Self-pay | Admitting: Internal Medicine

## 2021-07-09 DIAGNOSIS — E1122 Type 2 diabetes mellitus with diabetic chronic kidney disease: Secondary | ICD-10-CM | POA: Diagnosis not present

## 2021-07-09 DIAGNOSIS — I5032 Chronic diastolic (congestive) heart failure: Secondary | ICD-10-CM | POA: Diagnosis not present

## 2021-07-09 DIAGNOSIS — I10 Essential (primary) hypertension: Secondary | ICD-10-CM | POA: Diagnosis not present

## 2021-07-09 DIAGNOSIS — D649 Anemia, unspecified: Secondary | ICD-10-CM | POA: Diagnosis not present

## 2021-07-09 DIAGNOSIS — N1832 Chronic kidney disease, stage 3b: Secondary | ICD-10-CM | POA: Diagnosis not present

## 2021-07-09 DIAGNOSIS — N250 Renal osteodystrophy: Secondary | ICD-10-CM | POA: Diagnosis not present

## 2021-07-09 DIAGNOSIS — Z794 Long term (current) use of insulin: Secondary | ICD-10-CM | POA: Diagnosis not present

## 2021-07-15 DIAGNOSIS — N1832 Chronic kidney disease, stage 3b: Secondary | ICD-10-CM | POA: Diagnosis not present

## 2021-07-15 DIAGNOSIS — E1122 Type 2 diabetes mellitus with diabetic chronic kidney disease: Secondary | ICD-10-CM | POA: Diagnosis not present

## 2021-07-15 DIAGNOSIS — Z794 Long term (current) use of insulin: Secondary | ICD-10-CM | POA: Diagnosis not present

## 2021-07-29 DIAGNOSIS — Z8679 Personal history of other diseases of the circulatory system: Secondary | ICD-10-CM | POA: Diagnosis not present

## 2021-07-29 DIAGNOSIS — J439 Emphysema, unspecified: Secondary | ICD-10-CM | POA: Diagnosis not present

## 2021-07-29 DIAGNOSIS — Z6841 Body Mass Index (BMI) 40.0 and over, adult: Secondary | ICD-10-CM | POA: Diagnosis not present

## 2021-07-29 DIAGNOSIS — E782 Mixed hyperlipidemia: Secondary | ICD-10-CM | POA: Diagnosis not present

## 2021-07-29 DIAGNOSIS — G629 Polyneuropathy, unspecified: Secondary | ICD-10-CM | POA: Diagnosis not present

## 2021-07-29 DIAGNOSIS — Z0000 Encounter for general adult medical examination without abnormal findings: Secondary | ICD-10-CM | POA: Diagnosis not present

## 2021-07-29 DIAGNOSIS — I10 Essential (primary) hypertension: Secondary | ICD-10-CM | POA: Diagnosis not present

## 2021-07-29 DIAGNOSIS — J9611 Chronic respiratory failure with hypoxia: Secondary | ICD-10-CM | POA: Diagnosis not present

## 2021-07-29 DIAGNOSIS — M109 Gout, unspecified: Secondary | ICD-10-CM | POA: Diagnosis not present

## 2021-08-05 ENCOUNTER — Other Ambulatory Visit: Payer: Self-pay | Admitting: Internal Medicine

## 2021-08-07 ENCOUNTER — Other Ambulatory Visit: Payer: Self-pay | Admitting: Internal Medicine

## 2021-08-22 ENCOUNTER — Ambulatory Visit: Payer: Medicare Other | Admitting: Internal Medicine

## 2021-08-22 ENCOUNTER — Ambulatory Visit: Payer: Medicare Other

## 2021-09-04 ENCOUNTER — Other Ambulatory Visit: Payer: Self-pay | Admitting: Internal Medicine

## 2021-09-09 ENCOUNTER — Other Ambulatory Visit: Payer: Self-pay | Admitting: Internal Medicine

## 2021-09-25 DIAGNOSIS — Z87891 Personal history of nicotine dependence: Secondary | ICD-10-CM | POA: Diagnosis not present

## 2021-09-25 DIAGNOSIS — F17211 Nicotine dependence, cigarettes, in remission: Secondary | ICD-10-CM | POA: Diagnosis not present

## 2021-09-26 DIAGNOSIS — I10 Essential (primary) hypertension: Secondary | ICD-10-CM | POA: Diagnosis not present

## 2021-10-21 DIAGNOSIS — E1122 Type 2 diabetes mellitus with diabetic chronic kidney disease: Secondary | ICD-10-CM | POA: Diagnosis not present

## 2021-10-21 DIAGNOSIS — Z794 Long term (current) use of insulin: Secondary | ICD-10-CM | POA: Diagnosis not present

## 2021-10-21 DIAGNOSIS — N1832 Chronic kidney disease, stage 3b: Secondary | ICD-10-CM | POA: Diagnosis not present

## 2021-10-28 ENCOUNTER — Other Ambulatory Visit: Payer: Self-pay | Admitting: Internal Medicine

## 2021-10-31 DIAGNOSIS — R911 Solitary pulmonary nodule: Secondary | ICD-10-CM | POA: Diagnosis not present

## 2021-10-31 DIAGNOSIS — G4733 Obstructive sleep apnea (adult) (pediatric): Secondary | ICD-10-CM | POA: Diagnosis not present

## 2021-10-31 DIAGNOSIS — J432 Centrilobular emphysema: Secondary | ICD-10-CM | POA: Diagnosis not present

## 2021-10-31 DIAGNOSIS — Z9989 Dependence on other enabling machines and devices: Secondary | ICD-10-CM | POA: Diagnosis not present

## 2021-10-31 DIAGNOSIS — J9611 Chronic respiratory failure with hypoxia: Secondary | ICD-10-CM | POA: Diagnosis not present

## 2021-10-31 DIAGNOSIS — Z9981 Dependence on supplemental oxygen: Secondary | ICD-10-CM | POA: Diagnosis not present

## 2021-10-31 DIAGNOSIS — F17211 Nicotine dependence, cigarettes, in remission: Secondary | ICD-10-CM | POA: Diagnosis not present

## 2021-11-01 ENCOUNTER — Other Ambulatory Visit: Payer: Self-pay | Admitting: Internal Medicine

## 2021-11-13 ENCOUNTER — Other Ambulatory Visit: Payer: Self-pay | Admitting: Internal Medicine

## 2022-01-08 DIAGNOSIS — M899 Disorder of bone, unspecified: Secondary | ICD-10-CM | POA: Diagnosis not present

## 2022-01-08 DIAGNOSIS — I10 Essential (primary) hypertension: Secondary | ICD-10-CM | POA: Diagnosis not present

## 2022-01-08 DIAGNOSIS — N189 Chronic kidney disease, unspecified: Secondary | ICD-10-CM | POA: Diagnosis not present

## 2022-01-08 DIAGNOSIS — N1832 Chronic kidney disease, stage 3b: Secondary | ICD-10-CM | POA: Diagnosis not present

## 2022-01-08 DIAGNOSIS — I5032 Chronic diastolic (congestive) heart failure: Secondary | ICD-10-CM | POA: Diagnosis not present

## 2022-01-08 DIAGNOSIS — Z794 Long term (current) use of insulin: Secondary | ICD-10-CM | POA: Diagnosis not present

## 2022-01-08 DIAGNOSIS — D649 Anemia, unspecified: Secondary | ICD-10-CM | POA: Diagnosis not present

## 2022-01-08 DIAGNOSIS — E1122 Type 2 diabetes mellitus with diabetic chronic kidney disease: Secondary | ICD-10-CM | POA: Diagnosis not present

## 2022-01-30 DIAGNOSIS — I10 Essential (primary) hypertension: Secondary | ICD-10-CM | POA: Diagnosis not present

## 2022-01-30 DIAGNOSIS — L309 Dermatitis, unspecified: Secondary | ICD-10-CM | POA: Diagnosis not present

## 2022-01-30 DIAGNOSIS — G629 Polyneuropathy, unspecified: Secondary | ICD-10-CM | POA: Diagnosis not present

## 2022-01-30 DIAGNOSIS — J9611 Chronic respiratory failure with hypoxia: Secondary | ICD-10-CM | POA: Diagnosis not present

## 2022-02-12 DIAGNOSIS — Z862 Personal history of diseases of the blood and blood-forming organs and certain disorders involving the immune mechanism: Secondary | ICD-10-CM | POA: Diagnosis not present

## 2022-02-12 DIAGNOSIS — N1831 Chronic kidney disease, stage 3a: Secondary | ICD-10-CM | POA: Diagnosis not present

## 2022-02-12 DIAGNOSIS — D509 Iron deficiency anemia, unspecified: Secondary | ICD-10-CM | POA: Diagnosis not present

## 2022-02-12 DIAGNOSIS — D500 Iron deficiency anemia secondary to blood loss (chronic): Secondary | ICD-10-CM | POA: Diagnosis not present

## 2022-02-21 DIAGNOSIS — D509 Iron deficiency anemia, unspecified: Secondary | ICD-10-CM | POA: Diagnosis not present

## 2022-02-28 DIAGNOSIS — D509 Iron deficiency anemia, unspecified: Secondary | ICD-10-CM | POA: Diagnosis not present

## 2022-08-06 DIAGNOSIS — F339 Major depressive disorder, recurrent, unspecified: Secondary | ICD-10-CM | POA: Diagnosis not present

## 2022-08-06 DIAGNOSIS — Z1231 Encounter for screening mammogram for malignant neoplasm of breast: Secondary | ICD-10-CM | POA: Diagnosis not present

## 2022-08-06 DIAGNOSIS — Z1239 Encounter for other screening for malignant neoplasm of breast: Secondary | ICD-10-CM | POA: Diagnosis not present

## 2022-08-06 DIAGNOSIS — N1832 Chronic kidney disease, stage 3b: Secondary | ICD-10-CM | POA: Diagnosis not present

## 2022-08-06 DIAGNOSIS — Z9229 Personal history of other drug therapy: Secondary | ICD-10-CM | POA: Diagnosis not present

## 2022-08-06 DIAGNOSIS — Z86718 Personal history of other venous thrombosis and embolism: Secondary | ICD-10-CM | POA: Diagnosis not present

## 2022-08-06 DIAGNOSIS — Z6841 Body Mass Index (BMI) 40.0 and over, adult: Secondary | ICD-10-CM | POA: Diagnosis not present

## 2022-08-06 DIAGNOSIS — Z794 Long term (current) use of insulin: Secondary | ICD-10-CM | POA: Diagnosis not present

## 2022-08-06 DIAGNOSIS — Z0000 Encounter for general adult medical examination without abnormal findings: Secondary | ICD-10-CM | POA: Diagnosis not present

## 2022-08-06 DIAGNOSIS — I129 Hypertensive chronic kidney disease with stage 1 through stage 4 chronic kidney disease, or unspecified chronic kidney disease: Secondary | ICD-10-CM | POA: Diagnosis not present

## 2022-08-06 DIAGNOSIS — E1122 Type 2 diabetes mellitus with diabetic chronic kidney disease: Secondary | ICD-10-CM | POA: Diagnosis not present

## 2022-08-21 DIAGNOSIS — I5032 Chronic diastolic (congestive) heart failure: Secondary | ICD-10-CM | POA: Diagnosis not present

## 2022-08-21 DIAGNOSIS — D649 Anemia, unspecified: Secondary | ICD-10-CM | POA: Diagnosis not present

## 2022-08-21 DIAGNOSIS — E1122 Type 2 diabetes mellitus with diabetic chronic kidney disease: Secondary | ICD-10-CM | POA: Diagnosis not present

## 2022-08-21 DIAGNOSIS — Z794 Long term (current) use of insulin: Secondary | ICD-10-CM | POA: Diagnosis not present

## 2022-08-21 DIAGNOSIS — I10 Essential (primary) hypertension: Secondary | ICD-10-CM | POA: Diagnosis not present

## 2022-08-21 DIAGNOSIS — N189 Chronic kidney disease, unspecified: Secondary | ICD-10-CM | POA: Diagnosis not present

## 2022-08-21 DIAGNOSIS — M899 Disorder of bone, unspecified: Secondary | ICD-10-CM | POA: Diagnosis not present

## 2022-08-21 DIAGNOSIS — N1832 Chronic kidney disease, stage 3b: Secondary | ICD-10-CM | POA: Diagnosis not present

## 2022-08-26 DIAGNOSIS — R92323 Mammographic fibroglandular density, bilateral breasts: Secondary | ICD-10-CM | POA: Diagnosis not present

## 2022-08-26 DIAGNOSIS — Z1231 Encounter for screening mammogram for malignant neoplasm of breast: Secondary | ICD-10-CM | POA: Diagnosis not present

## 2022-09-16 DIAGNOSIS — E1122 Type 2 diabetes mellitus with diabetic chronic kidney disease: Secondary | ICD-10-CM | POA: Diagnosis not present

## 2022-09-16 DIAGNOSIS — N1832 Chronic kidney disease, stage 3b: Secondary | ICD-10-CM | POA: Diagnosis not present

## 2022-09-16 DIAGNOSIS — Z794 Long term (current) use of insulin: Secondary | ICD-10-CM | POA: Diagnosis not present

## 2022-11-04 DIAGNOSIS — F17211 Nicotine dependence, cigarettes, in remission: Secondary | ICD-10-CM | POA: Diagnosis not present

## 2022-11-19 DIAGNOSIS — Z122 Encounter for screening for malignant neoplasm of respiratory organs: Secondary | ICD-10-CM | POA: Diagnosis not present

## 2022-11-19 DIAGNOSIS — J9611 Chronic respiratory failure with hypoxia: Secondary | ICD-10-CM | POA: Diagnosis not present

## 2022-11-19 DIAGNOSIS — F17211 Nicotine dependence, cigarettes, in remission: Secondary | ICD-10-CM | POA: Diagnosis not present

## 2022-11-19 DIAGNOSIS — Z9981 Dependence on supplemental oxygen: Secondary | ICD-10-CM | POA: Diagnosis not present

## 2022-11-19 DIAGNOSIS — Z9989 Dependence on other enabling machines and devices: Secondary | ICD-10-CM | POA: Diagnosis not present

## 2022-11-19 DIAGNOSIS — J432 Centrilobular emphysema: Secondary | ICD-10-CM | POA: Diagnosis not present

## 2022-11-19 DIAGNOSIS — G4733 Obstructive sleep apnea (adult) (pediatric): Secondary | ICD-10-CM | POA: Diagnosis not present

## 2022-11-19 DIAGNOSIS — R911 Solitary pulmonary nodule: Secondary | ICD-10-CM | POA: Diagnosis not present

## 2022-12-22 DIAGNOSIS — H35342 Macular cyst, hole, or pseudohole, left eye: Secondary | ICD-10-CM | POA: Diagnosis not present

## 2022-12-22 DIAGNOSIS — H52223 Regular astigmatism, bilateral: Secondary | ICD-10-CM | POA: Diagnosis not present

## 2022-12-22 DIAGNOSIS — H35371 Puckering of macula, right eye: Secondary | ICD-10-CM | POA: Diagnosis not present

## 2022-12-22 DIAGNOSIS — H5203 Hypermetropia, bilateral: Secondary | ICD-10-CM | POA: Diagnosis not present

## 2022-12-22 DIAGNOSIS — H2511 Age-related nuclear cataract, right eye: Secondary | ICD-10-CM | POA: Diagnosis not present

## 2022-12-22 DIAGNOSIS — H16223 Keratoconjunctivitis sicca, not specified as Sjogren's, bilateral: Secondary | ICD-10-CM | POA: Diagnosis not present

## 2022-12-22 DIAGNOSIS — Z961 Presence of intraocular lens: Secondary | ICD-10-CM | POA: Diagnosis not present

## 2022-12-22 DIAGNOSIS — H524 Presbyopia: Secondary | ICD-10-CM | POA: Diagnosis not present

## 2022-12-22 DIAGNOSIS — E119 Type 2 diabetes mellitus without complications: Secondary | ICD-10-CM | POA: Diagnosis not present

## 2023-01-13 DIAGNOSIS — E1165 Type 2 diabetes mellitus with hyperglycemia: Secondary | ICD-10-CM | POA: Diagnosis not present

## 2023-01-13 DIAGNOSIS — Z794 Long term (current) use of insulin: Secondary | ICD-10-CM | POA: Diagnosis not present
